# Patient Record
Sex: Male | Born: 1955 | Race: Black or African American | Hispanic: No | Marital: Single | Smoking: Current every day smoker
Health system: Southern US, Community
[De-identification: ages and names within clinical notes are randomized; demographics above are authoritative.]

## PROBLEM LIST (undated history)

## (undated) DIAGNOSIS — C61 Malignant neoplasm of prostate: Secondary | ICD-10-CM

## (undated) DIAGNOSIS — I10 Essential (primary) hypertension: Secondary | ICD-10-CM

## (undated) HISTORY — DX: Malignant neoplasm of prostate: C61

---

## 2018-05-03 ENCOUNTER — Emergency Department (HOSPITAL_COMMUNITY)
Admission: EM | Admit: 2018-05-03 | Discharge: 2018-05-03 | Disposition: A | Discharge status: Home / Self Care | Payer: BLUE CROSS/BLUE SHIELD | Attending: Emergency Medicine | Admitting: Emergency Medicine

## 2018-05-03 ENCOUNTER — Emergency Department (HOSPITAL_COMMUNITY): Payer: BLUE CROSS/BLUE SHIELD

## 2018-05-03 ENCOUNTER — Encounter (HOSPITAL_COMMUNITY): Payer: Self-pay

## 2018-05-03 DIAGNOSIS — N5089 Other specified disorders of the male genital organs: Secondary | ICD-10-CM

## 2018-05-03 DIAGNOSIS — F17210 Nicotine dependence, cigarettes, uncomplicated: Secondary | ICD-10-CM | POA: Insufficient documentation

## 2018-05-03 DIAGNOSIS — N433 Hydrocele, unspecified: Secondary | ICD-10-CM

## 2018-05-03 DIAGNOSIS — I10 Essential (primary) hypertension: Secondary | ICD-10-CM | POA: Diagnosis not present

## 2018-05-03 HISTORY — DX: Essential (primary) hypertension: I10

## 2018-05-03 LAB — CBC WITH DIFFERENTIAL/PLATELET
Abs Immature Granulocytes: 0.03 10*3/uL (ref 0.00–0.07)
Basophils Absolute: 0.1 10*3/uL (ref 0.0–0.1)
Basophils Relative: 1 %
Eosinophils Absolute: 0.1 10*3/uL (ref 0.0–0.5)
Eosinophils Relative: 1 %
HCT: 49.7 % (ref 39.0–52.0)
Hemoglobin: 16.1 g/dL (ref 13.0–17.0)
Immature Granulocytes: 0 %
Lymphocytes Relative: 29 %
Lymphs Abs: 3 10*3/uL (ref 0.7–4.0)
MCH: 28.9 pg (ref 26.0–34.0)
MCHC: 32.4 g/dL (ref 30.0–36.0)
MCV: 89.2 fL (ref 80.0–100.0)
Monocytes Absolute: 0.9 10*3/uL (ref 0.1–1.0)
Monocytes Relative: 9 %
Neutro Abs: 6.2 10*3/uL (ref 1.7–7.7)
Neutrophils Relative %: 60 %
Platelets: 223 10*3/uL (ref 150–400)
RBC: 5.57 MIL/uL (ref 4.22–5.81)
RDW: 15.6 % — ABNORMAL HIGH (ref 11.5–15.5)
WBC: 10.3 10*3/uL (ref 4.0–10.5)
nRBC: 0 % (ref 0.0–0.2)

## 2018-05-03 LAB — BASIC METABOLIC PANEL
Anion gap: 7 (ref 5–15)
BUN: 13 mg/dL (ref 8–23)
CO2: 23 mmol/L (ref 22–32)
Calcium: 9.2 mg/dL (ref 8.9–10.3)
Chloride: 107 mmol/L (ref 98–111)
Creatinine, Ser: 1.11 mg/dL (ref 0.61–1.24)
GFR calc Af Amer: >60 mL/min (ref >60)
GFR calc non Af Amer: >60 mL/min (ref >60)
Glucose, Bld: 108 mg/dL — ABNORMAL HIGH (ref 70–99)
Potassium: 4.3 mmol/L (ref 3.5–5.1)
Sodium: 137 mmol/L (ref 135–145)

## 2018-05-03 MED ORDER — HYDROCHLOROTHIAZIDE 25 MG PO TABS
25.0000 mg | ORAL_TABLET | Freq: Every day | ORAL | Status: DC
  Administered 2018-05-03: 25 mg via ORAL
  Filled 2018-05-03: qty 1

## 2018-05-03 MED ORDER — LISINOPRIL 20 MG PO TABS
20.0000 mg | ORAL_TABLET | Freq: Once | ORAL | Status: AC
  Administered 2018-05-03: 20 mg via ORAL
  Filled 2018-05-03: qty 1

## 2018-05-03 MED ORDER — HYDROCHLOROTHIAZIDE 25 MG PO TABS: 25.0000 mg | ORAL_TABLET | Freq: Every day | ORAL | 0 refills | Status: DC

## 2018-05-03 MED ORDER — LISINOPRIL 20 MG PO TABS: 40.0000 mg | ORAL_TABLET | Freq: Every day | ORAL | 0 refills | Status: DC

## 2018-05-03 NOTE — ED Provider Notes (Signed)
[facility] EMER[facility] DEPARTMENT Provider Note   CSN: [id number] Arrival date & time: 05/03/18  1328     History   Chief Complaint Chief Complaint  Patient presents with  . Groin Swelling    HPI Randall Larsen is a 63 y.o. male.  HPI   63 year old male presents today with complaints of right testicular swelling.  Patient is a poor historian but notes that approximately 4 months ago he was at the gym working out when he felt pain in his right pelvic region.  He notes since that time he has had swelling to his right testicle, he denies any significant pain with this, denies any dysuria abdominal pain fever, weight loss, or any swelling to the left testicle or scrotum.  Patient denies any history of the same.  No history of cancer, he reports he is a smoker.  He notes he has not been taking his antihypertensive medications.  He reports he is supposed to be using lisinopril 40 mg, HCTZ 25 but has not taken them in the last 2 months.  Denies any chest pain shortness of breath.     Past Medical History:  Diagnosis Date  . Hypertension     There are no active problems to display for this patient.   History reviewed. No pertinent surgical history.      Home Medications    Prior to Admission medications   Medication Sig Start Date End Date Taking? Authorizing Provider  hydrochlorothiazide (HYDRODIURIL) 25 MG tablet Take 1 tablet (25 mg total) by mouth daily. 05/03/18   Josha Weekley, Dellis Filbert, PA-C  lisinopril (PRINIVIL,ZESTRIL) 20 MG tablet Take 2 tablets (40 mg total) by mouth daily. 05/03/18   Okey Regal, PA-C    Family History History reviewed. No pertinent family history.  Social History Social History   Tobacco Use  . Smoking status: Current Every Day Smoker    Packs/day: 0.50    Types: Cigarettes  Substance Use Topics  . Alcohol use: Not Currently  . Drug use: Not Currently     Allergies   Patient has no known allergies.   Review of  Systems Review of Systems  All other systems reviewed and are negative.    Physical Exam Updated Vital Signs BP (!) 188/118 (BP Location: Right Arm)   Pulse 78   Temp 97.9 F (36.6 C) (Oral)   Resp 20   SpO2 99%   Physical Exam Vitals signs and nursing note reviewed.  Constitutional:      Appearance: He is well-developed.  HENT:     Head: Normocephalic and atraumatic.  Eyes:     General: No scleral icterus.       Right eye: No discharge.        Left eye: No discharge.     Conjunctiva/sclera: Conjunctivae normal.     Pupils: Pupils are equal, round, and reactive to light.  Neck:     Musculoskeletal: Normal range of motion.     Vascular: No JVD.     Trachea: No tracheal deviation.  Pulmonary:     Effort: Pulmonary effort is normal.     Breath sounds: No stridor.  Abdominal:     General: There is no distension.     Palpations: Abdomen is soft.  Genitourinary:    Comments: Uncircumcised penis, right testicle approximately 10 cm in length by 8 cm in diameter, nontender-left testicle descended-no erythema noted to the scrotum Neurological:     Mental Status: He is alert and oriented to person,  place, and time.     Coordination: Coordination normal.  Psychiatric:        Behavior: Behavior normal.        Thought Content: Thought content normal.        Judgment: Judgment normal.     ED Treatments / Results  Labs (all labs ordered are listed, but only abnormal results are displayed) Labs Reviewed  BASIC METABOLIC PANEL - Abnormal; Notable for the following components:      Result Value   Glucose, Bld 108 (*)    All other components within normal limits  CBC WITH DIFFERENTIAL/PLATELET    EKG None  Radiology No results found.  Procedures Procedures (including critical care time)  Medications Ordered in ED Medications  hydrochlorothiazide (HYDRODIURIL) tablet 25 mg (has no administration in time range)  lisinopril (PRINIVIL,ZESTRIL) tablet 20 mg (has no  administration in time range)     Initial Impression / Assessment and Plan / ED Course  I have reviewed the triage vital signs and the nursing notes.  Pertinent labs & imaging results that were available during my care of the patient were reviewed by me and considered in my medical decision making (see chart for details).       Assessment/Plan: 63 year old male presents today with testicular swelling.  Patient will need ultrasound for further evaluation.  He is also hypertensive but asymptomatic.  Patient will need to be discharged on antihypertensive medication, testicular etiology pending ultrasound.  Patient care signed to oncoming provider pending ultrasound results   Final Clinical Impressions(s) / ED Diagnoses   Final diagnoses:  Hypertension, unspecified type  Testicular swelling    ED Discharge Orders         Ordered    lisinopril (PRINIVIL,ZESTRIL) 20 MG tablet  Daily     05/03/18 1517    hydrochlorothiazide (HYDRODIURIL) 25 MG tablet  Daily     05/03/18 1517           Okey Regal, PA-C 05/03/18 1532    Pattricia Boss, MD 05/04/18 1226

## 2018-05-03 NOTE — ED Notes (Signed)
Urine sample held in mini lab.

## 2018-05-03 NOTE — ED Triage Notes (Signed)
Onset 2 months right testicle swelling.  No pain.  Elevated BP at triage.  Pt has not been on BP med since November, "states I tried to wean myself off of them".  Would like refills on BP meds.

## 2018-05-03 NOTE — ED Notes (Signed)
Patient transported to Ultrasound 

## 2021-02-04 ENCOUNTER — Emergency Department (HOSPITAL_COMMUNITY)
Admission: EM | Admit: 2021-02-04 | Discharge: 2021-02-05 | Disposition: A | Discharge status: Home / Self Care | Payer: Medicare Other | Attending: Emergency Medicine | Admitting: Emergency Medicine

## 2021-02-04 ENCOUNTER — Emergency Department (HOSPITAL_COMMUNITY): Payer: Medicare Other

## 2021-02-04 ENCOUNTER — Other Ambulatory Visit: Payer: Self-pay

## 2021-02-04 ENCOUNTER — Encounter (HOSPITAL_COMMUNITY): Payer: Self-pay | Admitting: Emergency Medicine

## 2021-02-04 DIAGNOSIS — I10 Essential (primary) hypertension: Secondary | ICD-10-CM | POA: Diagnosis not present

## 2021-02-04 DIAGNOSIS — R0789 Other chest pain: Secondary | ICD-10-CM | POA: Diagnosis not present

## 2021-02-04 DIAGNOSIS — M898X8 Other specified disorders of bone, other site: Secondary | ICD-10-CM | POA: Insufficient documentation

## 2021-02-04 DIAGNOSIS — M25551 Pain in right hip: Secondary | ICD-10-CM | POA: Insufficient documentation

## 2021-02-04 DIAGNOSIS — Z79899 Other long term (current) drug therapy: Secondary | ICD-10-CM | POA: Insufficient documentation

## 2021-02-04 DIAGNOSIS — F17210 Nicotine dependence, cigarettes, uncomplicated: Secondary | ICD-10-CM | POA: Diagnosis not present

## 2021-02-04 DIAGNOSIS — M899 Disorder of bone, unspecified: Secondary | ICD-10-CM

## 2021-02-04 LAB — CBC WITH DIFFERENTIAL/PLATELET
Abs Immature Granulocytes: 0.11 10*3/uL — ABNORMAL HIGH (ref 0.00–0.07)
Basophils Absolute: 0.1 10*3/uL (ref 0.0–0.1)
Basophils Relative: 1 %
Eosinophils Absolute: 0 10*3/uL (ref 0.0–0.5)
Eosinophils Relative: 0 %
HCT: 45.8 % (ref 39.0–52.0)
Hemoglobin: 16.3 g/dL (ref 13.0–17.0)
Immature Granulocytes: 1 %
Lymphocytes Relative: 23 %
Lymphs Abs: 2.8 10*3/uL (ref 0.7–4.0)
MCH: 31.2 pg (ref 26.0–34.0)
MCHC: 35.6 g/dL (ref 30.0–36.0)
MCV: 87.6 fL (ref 80.0–100.0)
Monocytes Absolute: 1 10*3/uL (ref 0.1–1.0)
Monocytes Relative: 8 %
Neutro Abs: 8.5 10*3/uL — ABNORMAL HIGH (ref 1.7–7.7)
Neutrophils Relative %: 67 %
Platelets: 242 10*3/uL (ref 150–400)
RBC: 5.23 MIL/uL (ref 4.22–5.81)
RDW: 14.8 % (ref 11.5–15.5)
WBC: 12.6 10*3/uL — ABNORMAL HIGH (ref 4.0–10.5)
nRBC: 0.2 % (ref 0.0–0.2)

## 2021-02-04 LAB — COMPREHENSIVE METABOLIC PANEL
ALT: 20 U/L (ref 0–44)
AST: 21 U/L (ref 15–41)
Albumin: 3.9 g/dL (ref 3.5–5.0)
Alkaline Phosphatase: 83 U/L (ref 38–126)
Anion gap: 10 (ref 5–15)
BUN: 12 mg/dL (ref 8–23)
CO2: 22 mmol/L (ref 22–32)
Calcium: 9.6 mg/dL (ref 8.9–10.3)
Chloride: 105 mmol/L (ref 98–111)
Creatinine, Ser: 1.49 mg/dL — ABNORMAL HIGH (ref 0.61–1.24)
GFR, Estimated: 51 mL/min — ABNORMAL LOW (ref >60)
Glucose, Bld: 109 mg/dL — ABNORMAL HIGH (ref 70–99)
Potassium: 3.4 mmol/L — ABNORMAL LOW (ref 3.5–5.1)
Sodium: 137 mmol/L (ref 135–145)
Total Bilirubin: 1.1 mg/dL (ref 0.3–1.2)
Total Protein: 6.2 g/dL — ABNORMAL LOW (ref 6.5–8.1)

## 2021-02-04 LAB — LIPASE, BLOOD: Lipase: 21 U/L (ref 11–51)

## 2021-02-04 LAB — TROPONIN I (HIGH SENSITIVITY): Troponin I (High Sensitivity): 39 ng/L — ABNORMAL HIGH (ref <18)

## 2021-02-04 IMAGING — CR DG CHEST 2V
2 series · 2 of 2 positions shown · non-contrast
Comparison: None.

CLINICAL DATA: Chest pain

EXAM:
CHEST - 2 VIEW

[chest lat]
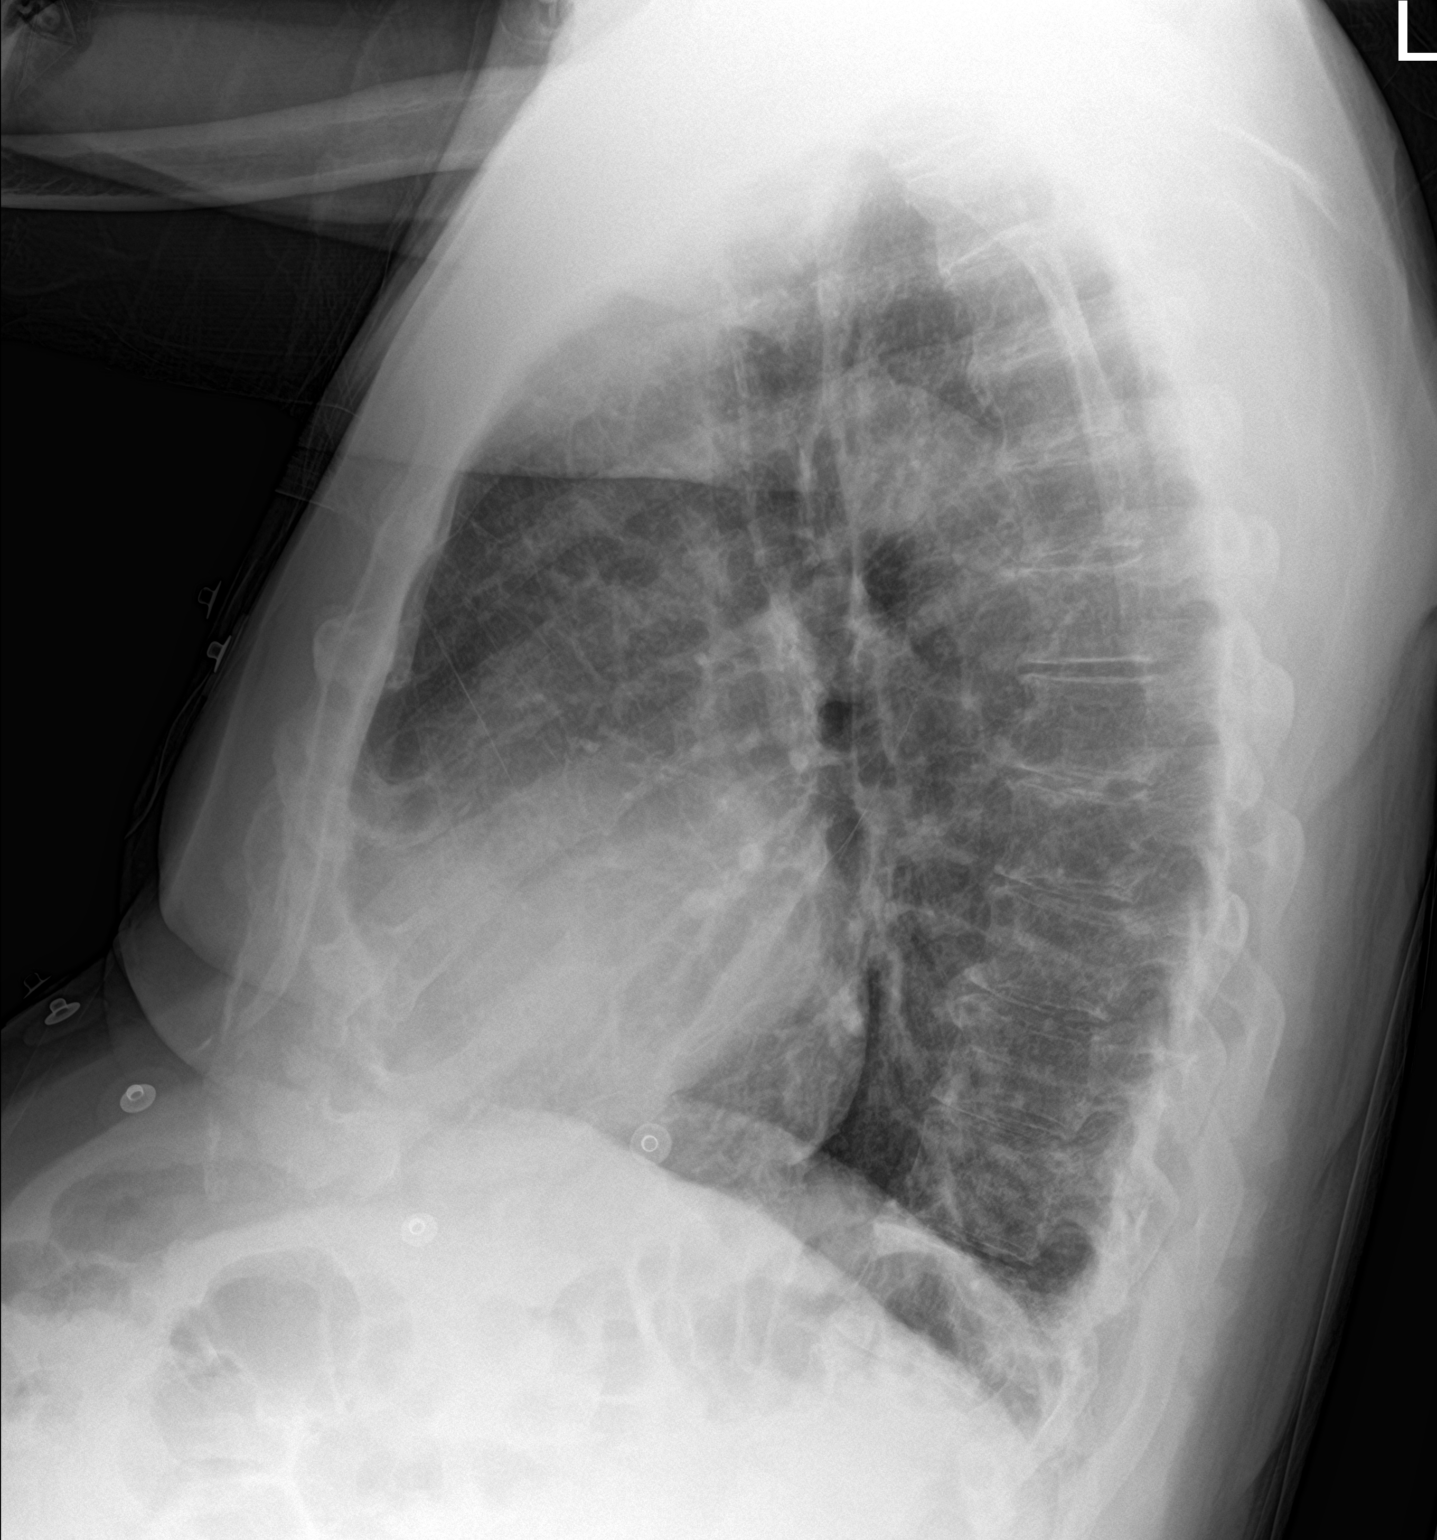

[chest ap]
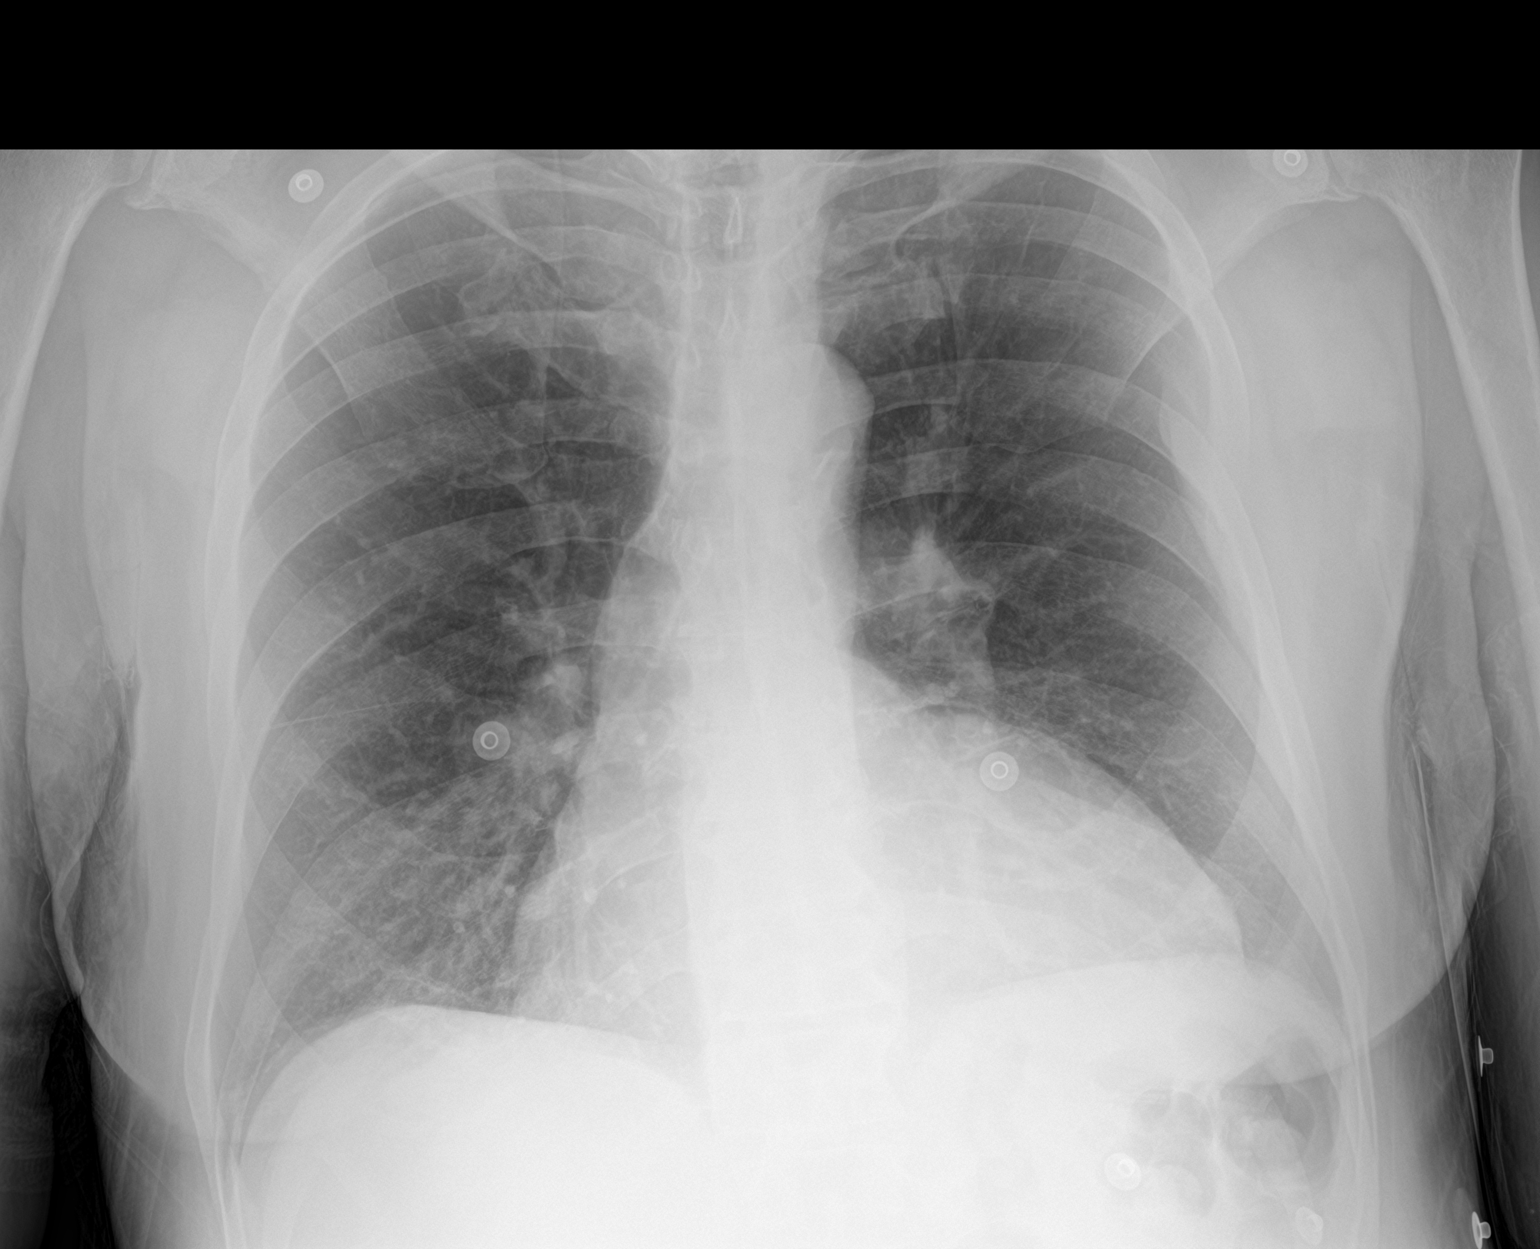

[2 of 2 positions shown; findings below may reference images not displayed]

FINDINGS: The lungs are symmetrically well expanded and are clear. There is
left-sided pleural thickening laterally within the mid to upper lung
zone with possible destruction of the lateral aspect of the left
fifth rib no pneumothorax or pleural effusion. Cardiac size is
mildly enlarged. Pulmonary vascularity is normal.
IMPRESSION: Possible left chest wall mass with destruction of the left fifth rib
laterally. Contrast enhanced CT examination of the chest would be
helpful for further evaluation.

Mild cardiomegaly

## 2021-02-04 NOTE — ED Provider Notes (Signed)
Emergency Medicine Provider Triage Evaluation Note  Randall Larsen , a 65 y.o. male  was evaluated in triage.  Pt complains of chest pain that started at 7pm.  He reports that he had lower back right sided pain over the hip for a few days.  His chest pain is worse with movement.  He had asa and nitro pta with no change in the pain.   As soon as I walk in the room before asking questions he asks me for a sandwich.   Review of Systems  Positive: Chest pain, shortness of breath Negative: vomiting  Physical Exam  BP 124/67 (BP Location: Left Arm)   Pulse 74   Temp 98.5 F (36.9 C) (Oral)   Resp 15   SpO2 100%  Gen:   Awake, no distress   Resp:  Normal effort  MSK:   Moves extremities without difficulty  Other:  Normal speech  Medical Decision Making  Medically screening exam initiated at 9:11 PM.  Appropriate orders placed.  Randall Larsen was informed that the remainder of the evaluation will be completed by another provider, this initial triage assessment does not replace that evaluation, and the importance of remaining in the ED until their evaluation is complete.     Ollen Gross 02/04/21 2114    Tegeler, Gwenyth Allegra, MD 02/05/21 [phone]

## 2021-02-04 NOTE — ED Triage Notes (Signed)
Patient arrived with EMS from home reports central chest pain with SOB worse with movement , he received ASA 324 mg and 1 NTG sl with no relief.

## 2021-02-04 NOTE — ED Notes (Signed)
Randall Larsen [phone] would like an update

## 2021-02-05 ENCOUNTER — Emergency Department (HOSPITAL_COMMUNITY): Payer: Medicare Other

## 2021-02-05 LAB — TROPONIN I (HIGH SENSITIVITY): Troponin I (High Sensitivity): 41 ng/L — ABNORMAL HIGH (ref <18)

## 2021-02-05 IMAGING — CT CT T SPINE W/O CM
3 of 4 series · 13 of 33 positions shown, 16 images · IV contrast (agent unspecified)
Comparison: None

CLINICAL DATA: Abnormal chest radiograph, bone neoplasm suspected

EXAM:
CT Thoracic Spine without contrast
TECHNIQUE: Multiplanar CT images of the thoracic spine were reconstructed from
contemporary CT of the Chest.
CONTRAST:  None

[Series 4: t-spine 2.0 st · axial · 0.48mm/px · z∈[+1266,+1462]mm · 5 of 148 slices shown, 7 images]
[im 25/148  soft-tissue]
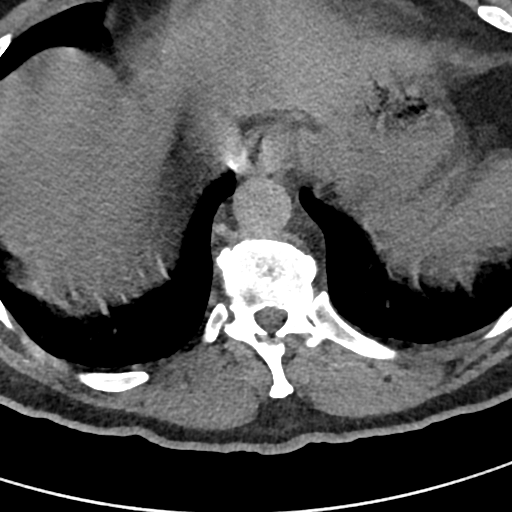
[im 25/148  bone]
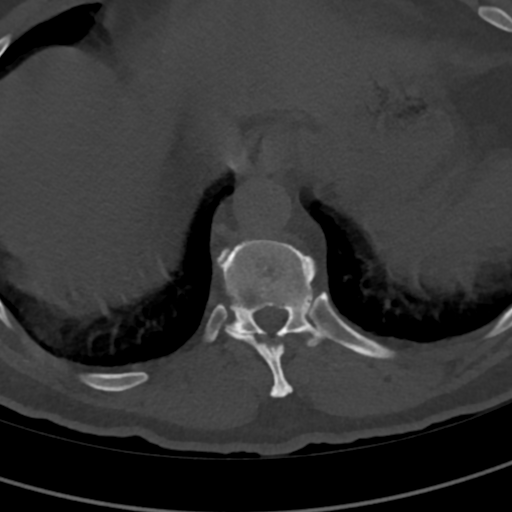
[im 50/148  bone]
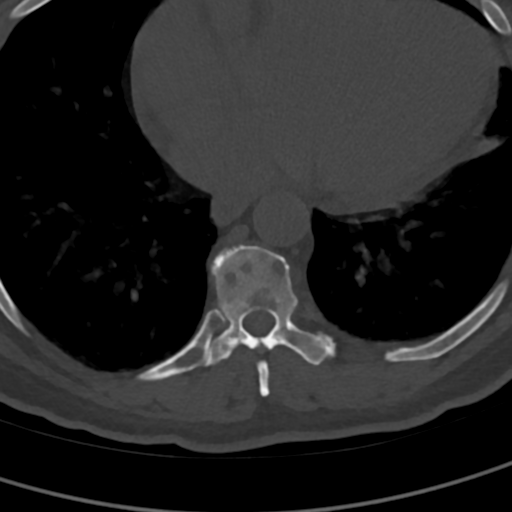
[im 74/148  bone]
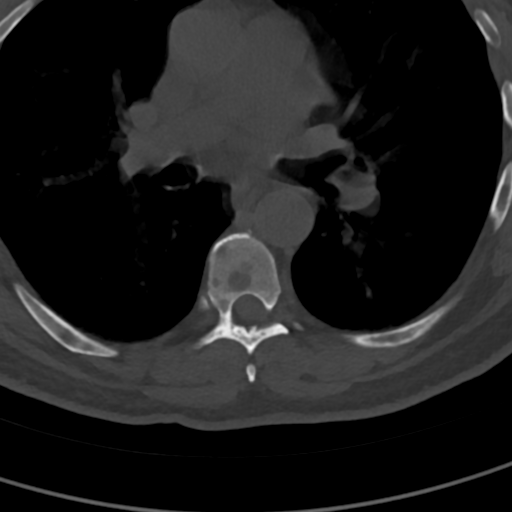
[im 99/148  bone]
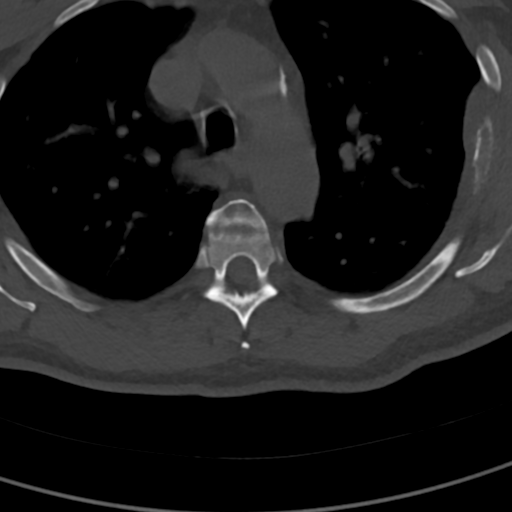
[im 123/148  soft-tissue]
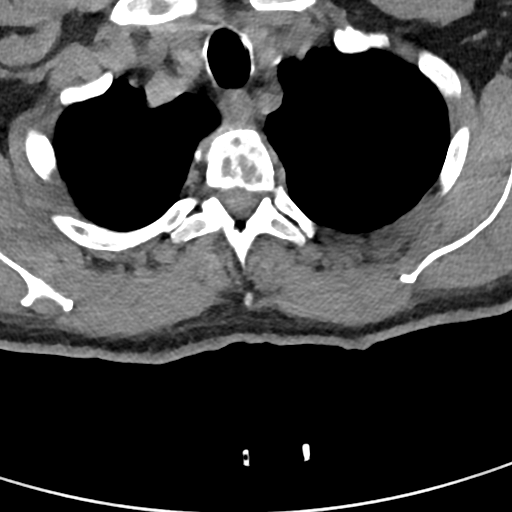
[im 123/148  bone]
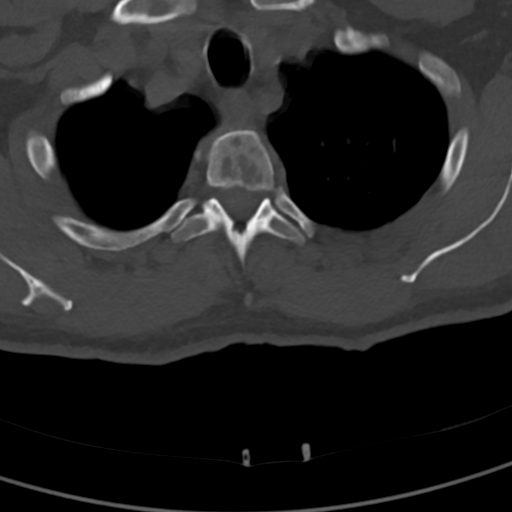

[Series 8: t-spine 2.0 cor bone · coronal · 0.43mm/px · 3 of 87 slices shown]
[im 18/87  bone]
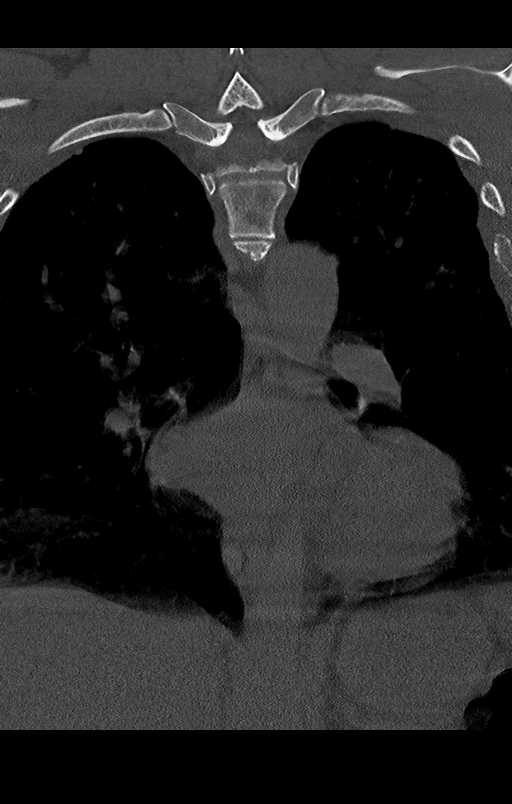
[im 35/87  bone]
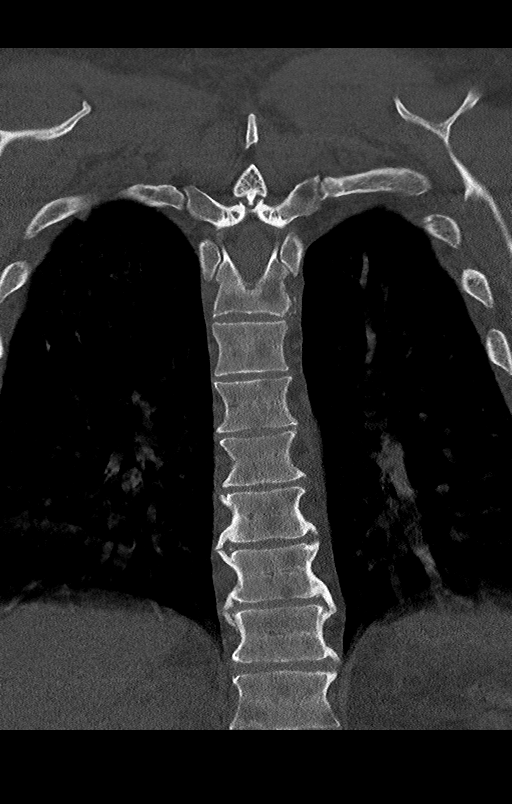
[im 52/87  bone]
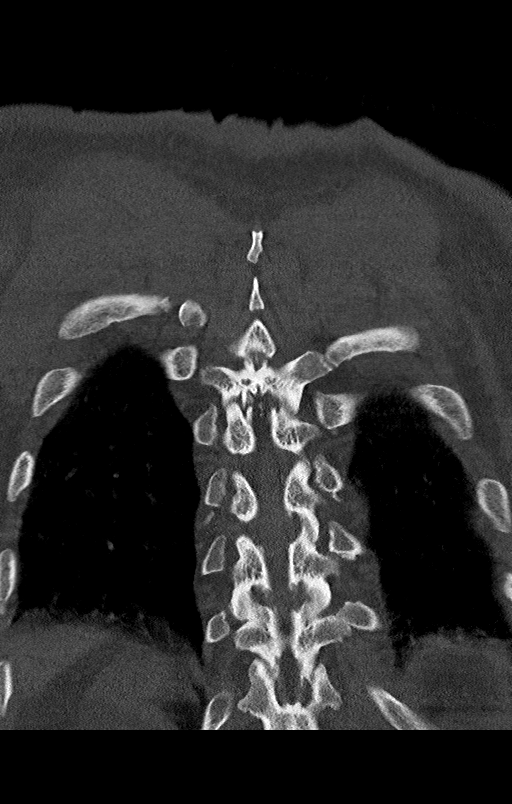

[Series 9: t-spine 2.0 sag bone · sagittal · 0.43mm/px · 5 of 61 slices shown, 6 images]
[im 21/61  bone]
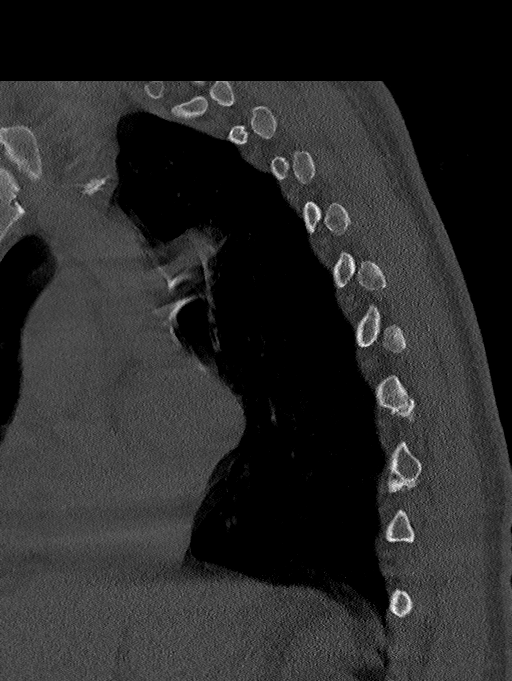
[im 26/61  bone]
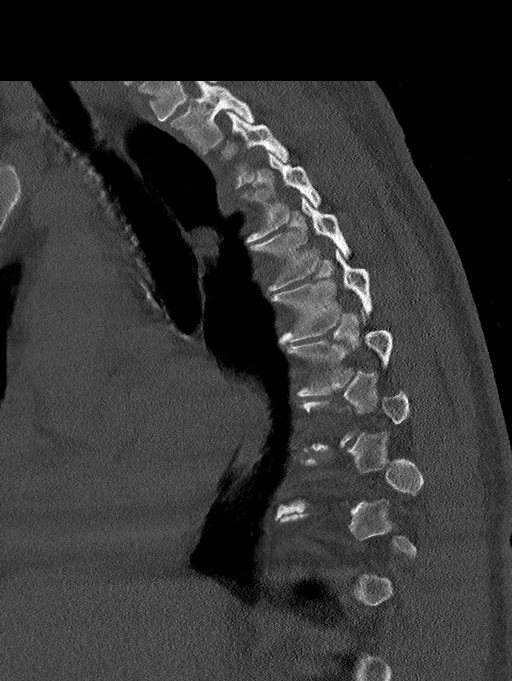
[im 31/61  soft-tissue]
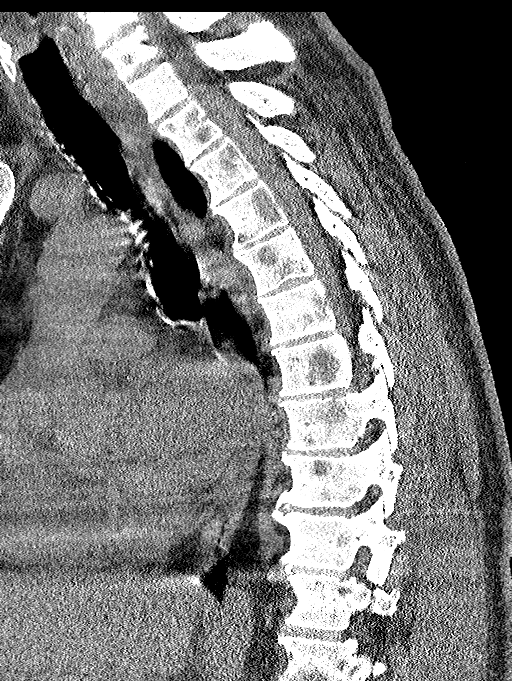
[im 31/61  bone]
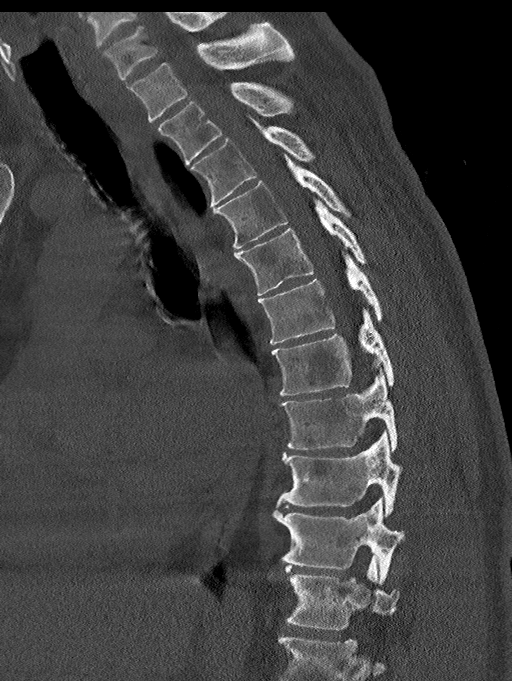
[im 36/61  bone]
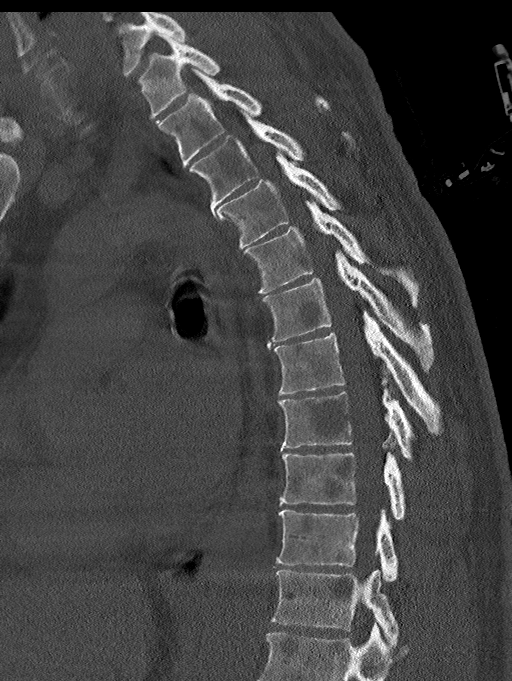
[im 41/61  bone]
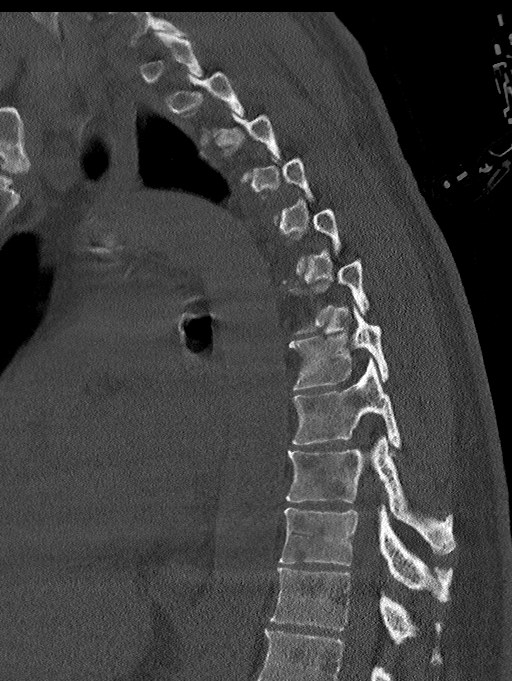

[13 of 33 positions shown; findings below may reference images not displayed]

FINDINGS: Alignment: Normal thoracic kyphosis.

Vertebrae: No evidence of fracture or dislocation. Vertebral body
heights are maintained. Numerous lytic lesions with throughout the
thoracic spine, including a dominant 19 mm lesion along the
posterior aspect of the T8 vertebral body (series 5/image 89). Given
the additional findings on CT chest, this appearance favors lytic
metastases over myeloma. No evidence of pathologic fracture.

Paraspinal and other soft tissues: Better evaluated on dedicated CT
chest.

Disc levels: Mild multilevel degenerative changes. Spinal canal is
patent.
IMPRESSION: Numerous lytic lesions throughout the thoracic spine, favoring lytic
metastases, less likely myeloma. No evidence of pathologic fracture.

## 2021-02-05 IMAGING — CT CT CHEST W/O CM
2 of 3 series · 15 of 36 positions shown, 18 images · non-contrast
Comparison: Chest radiograph dated [DATE]

CLINICAL DATA: Abnormal chest radiograph, back pain

EXAM:
CT CHEST WITHOUT CONTRAST
TECHNIQUE: Multidetector CT imaging of the chest was performed following the
standard protocol without IV contrast.

[Series 3: chest w/o 2mm st · axial · non-contrast · 0.89mm/px · z∈[+1210,+1486]mm · 12 of 164 slices shown, 15 images]
[im 13/164  mediastinal]
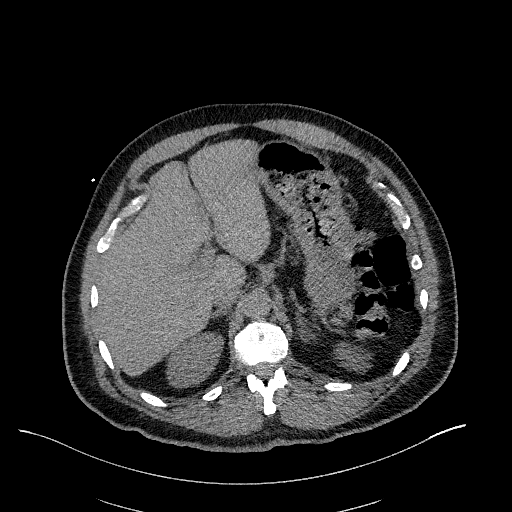
[im 13/164  lung]
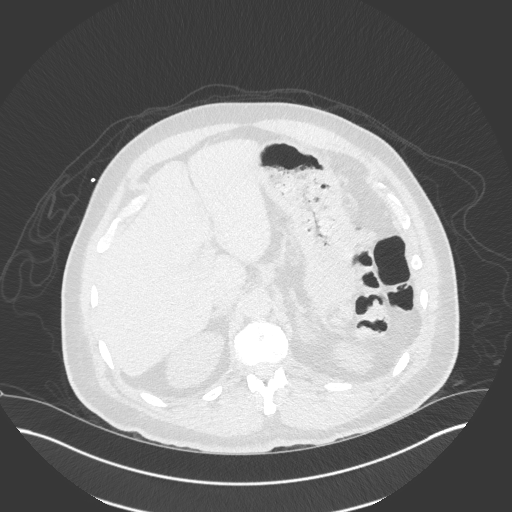
[im 25/164  lung]
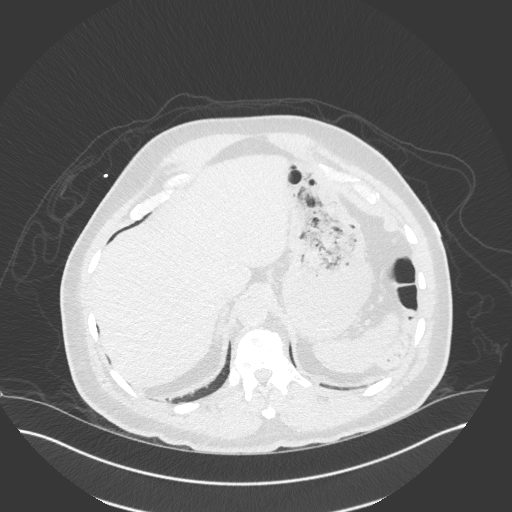
[im 37/164  lung]
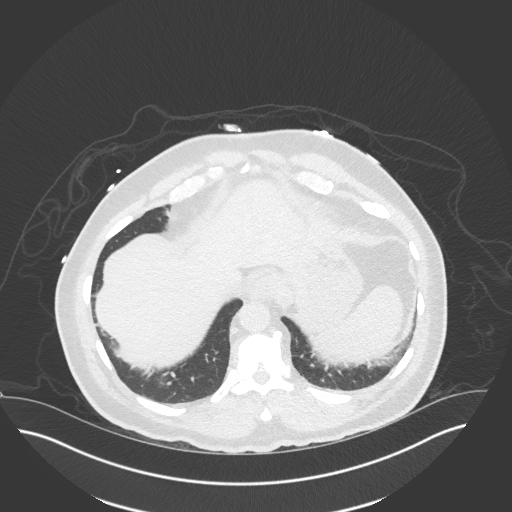
[im 49/164  lung]
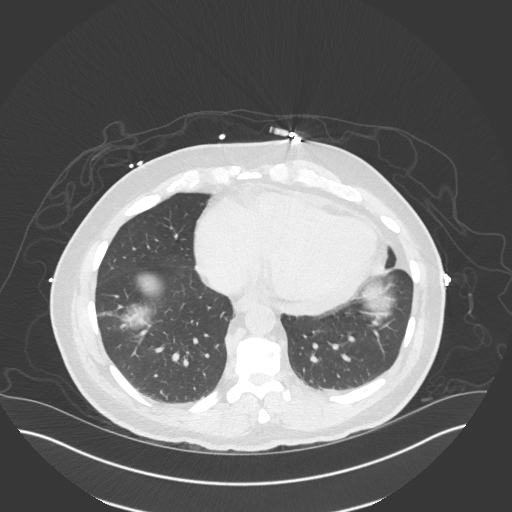
[im 61/164  mediastinal]
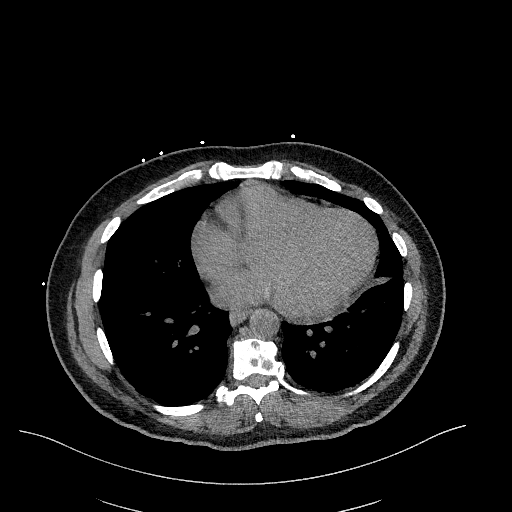
[im 61/164  lung]
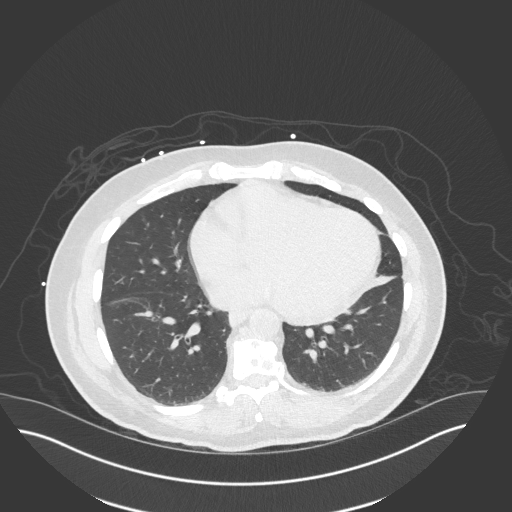
[im 73/164  lung]
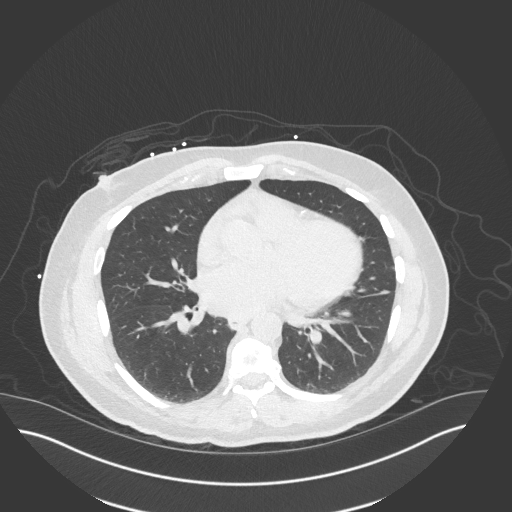
[im 91/164  lung]
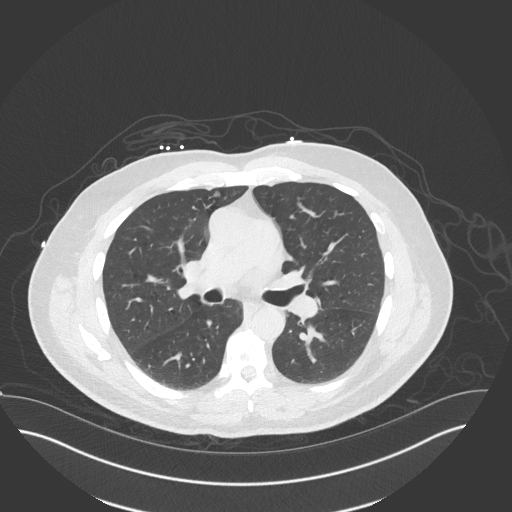
[im 103/164  lung]
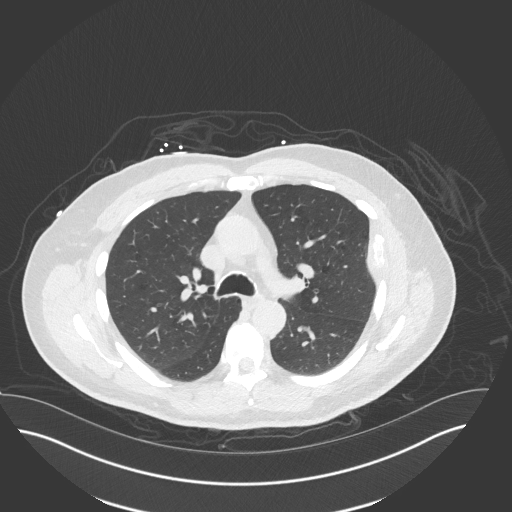
[im 115/164  mediastinal]
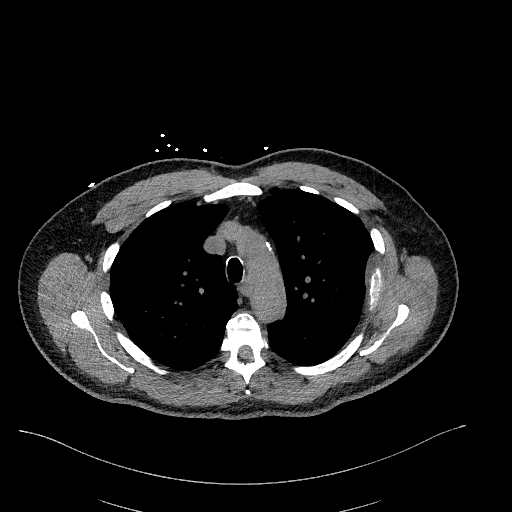
[im 115/164  lung]
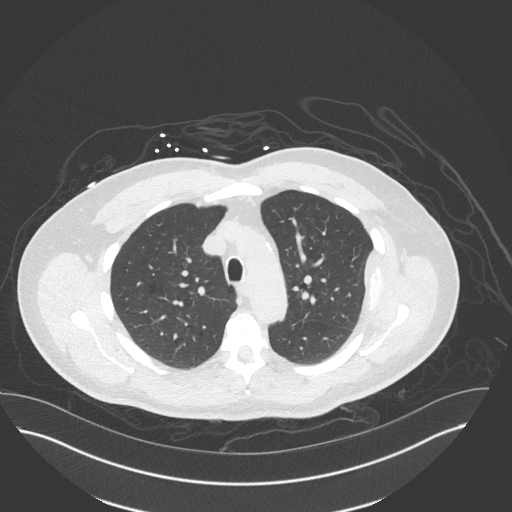
[im 127/164  lung]
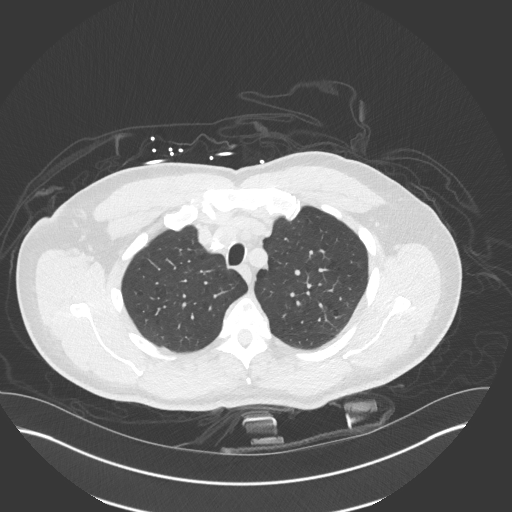
[im 139/164  lung]
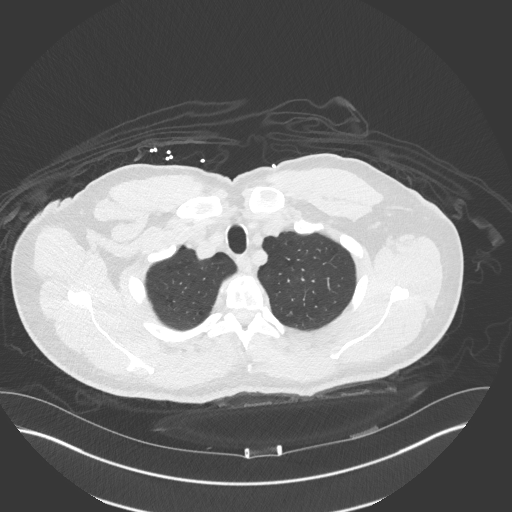
[im 151/164  lung]
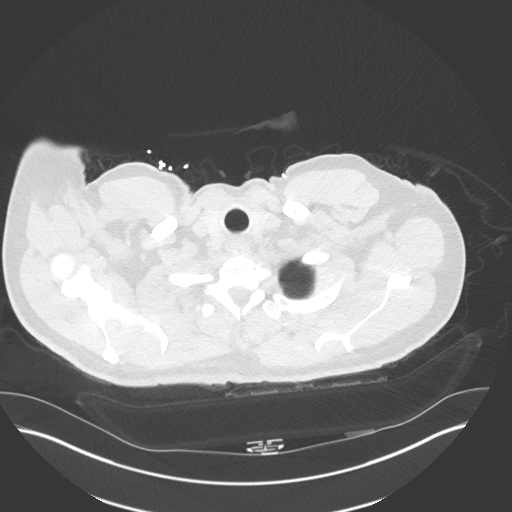

[Series 6: chest w/o 2mm st cor · coronal · non-contrast · 0.64mm/px · 3 of 151 slices shown]
[im 31/151  lung]
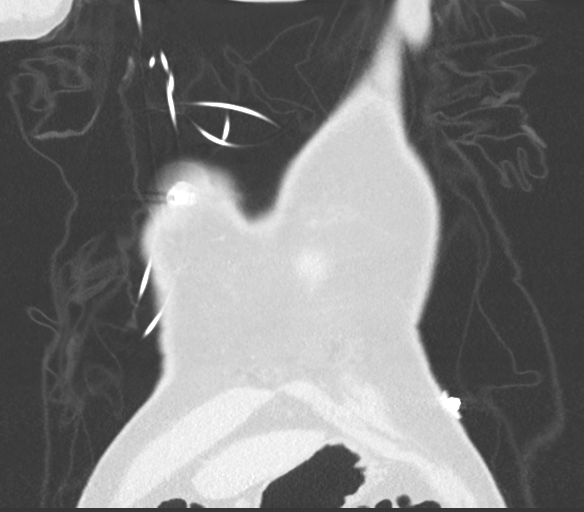
[im 61/151  lung]
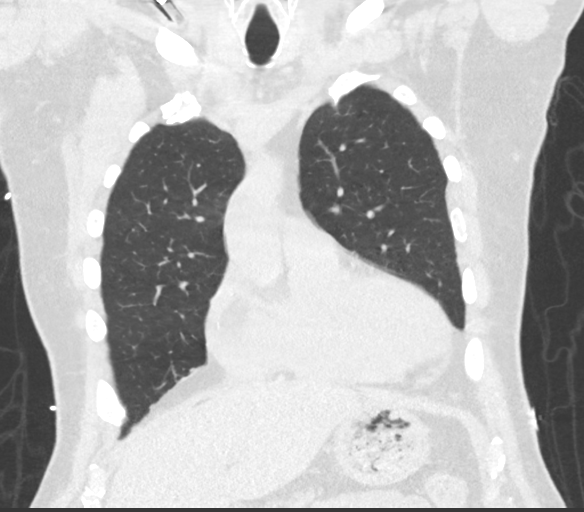
[im 91/151  lung]
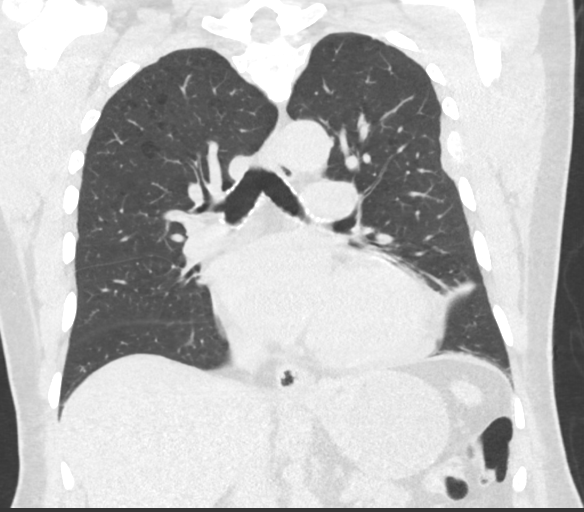

[15 of 36 positions shown; findings below may reference images not displayed]

FINDINGS: Cardiovascular: Mild cardiomegaly. Small inferior pericardial
effusion.

No evidence of thoracic aortic aneurysm. Mild atherosclerotic
calcifications of the aortic arch.

Three vessel coronary atherosclerosis.

Mediastinum/Nodes: No suspicious mediastinal lymphadenopathy.

Visualized thyroid is unremarkable.

Lungs/Pleura: 6 mm nodule in the medial right upper lobe (series
4/image 35).

Lungs are otherwise clear.

No focal consolidation.

Mild centrilobular emphysematous changes, upper lung predominant.

Minimal bibasilar atelectasis.

No pleural effusion or pneumothorax.

Upper Abdomen: Visualized upper abdomen is grossly unremarkable.

Musculoskeletal: Destructive lesion involving the left lateral 4th
rib with associated soft tissue (series 3/image 55), corresponding
to the radiographic abnormality. This appearance favors a lytic
metastasis with pathologic fracture (series 3/image 62).

Numerous lytic lesions throughout the visualized axial and
appendicular skeleton, suggesting lytic metastases or less likely
myeloma.

Dedicated evaluation of the thoracic spine has been obtained and
will be reported separately.
IMPRESSION: Destructive lesion involving the left lateral 4th rib with
associated soft tissue in pathologic fracture, corresponding to the
radiographic abnormality. This appearance favors lytic metastasis.

Numerous lytic lesions throughout the visualized axial and
appendicular skeleton, suggesting liver metastases are less likely
myeloma.

6 mm nodule in the medial right upper lobe. In the setting of
suspected malignancy, Fleischner guidelines do not apply. Follow-up
CT chest is suggested in 3-6 months.

Dedicated evaluation of the thoracic spine has been obtained and
will be reported separately.

Aortic Atherosclerosis ([3G]-[3G]) and Emphysema ([3G]-[3G]).

## 2021-02-05 IMAGING — CT CT L SPINE W/O CM
3 series · 13 of 33 positions shown, 16 images · non-contrast
Comparison: None.

CLINICAL DATA: Low back pain

EXAM:
CT LUMBAR SPINE WITHOUT CONTRAST
TECHNIQUE: Multidetector CT imaging of the lumbar spine was performed without
intravenous contrast administration. Multiplanar CT image
reconstructions were also generated.

[Series 3: l-spine 2.0 st · axial · 0.37mm/px · z∈[+1054,+1216]mm · 5 of 117 slices shown, 7 images]
[im 18/117  soft-tissue]
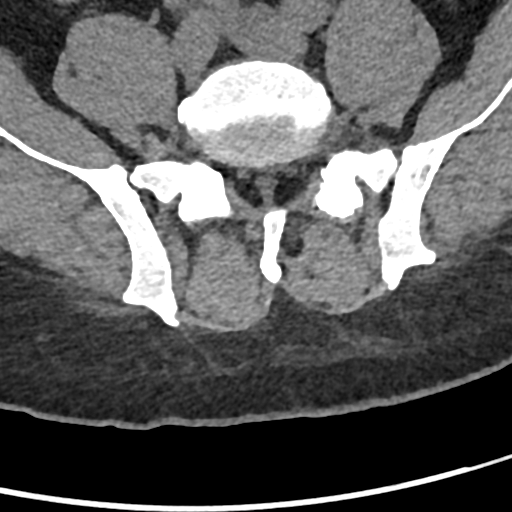
[im 18/117  bone]
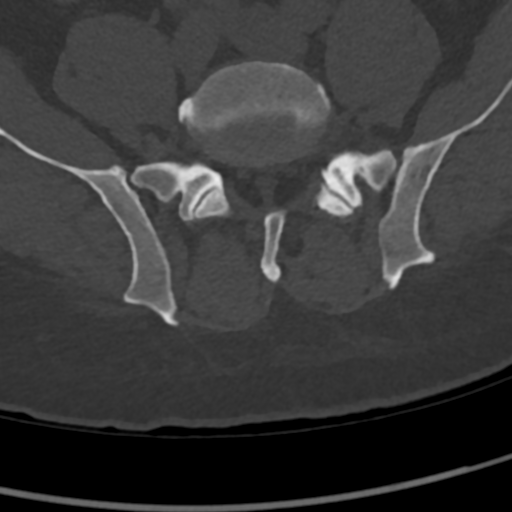
[im 36/117  bone]
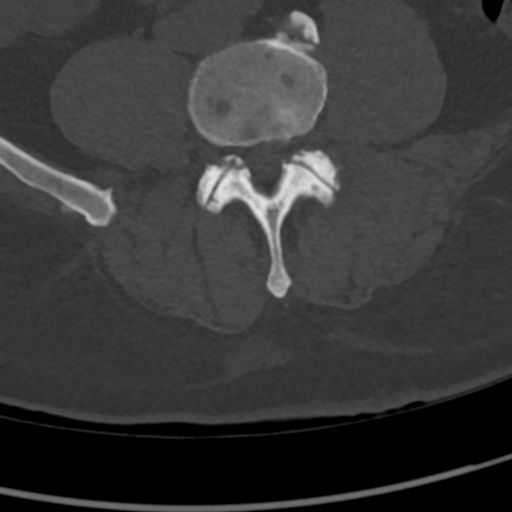
[im 63/117  bone]
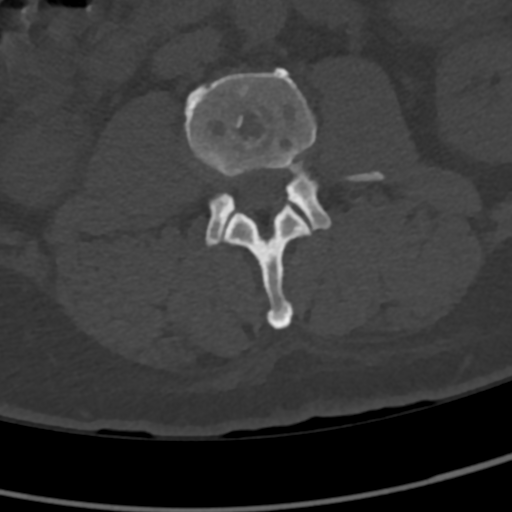
[im 81/117  bone]
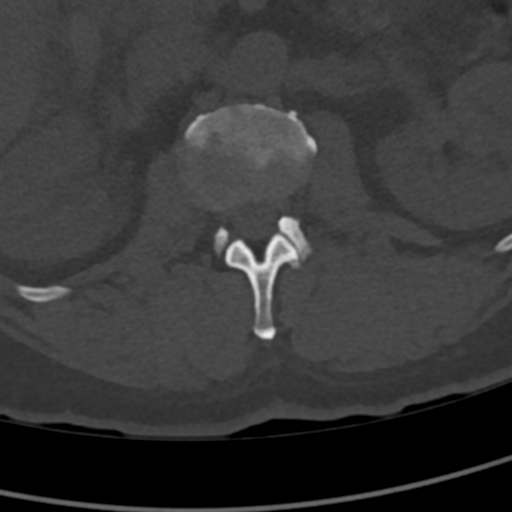
[im 99/117  soft-tissue]
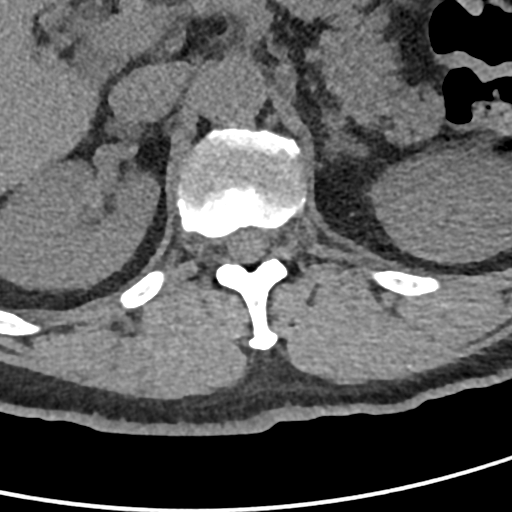
[im 99/117  bone]
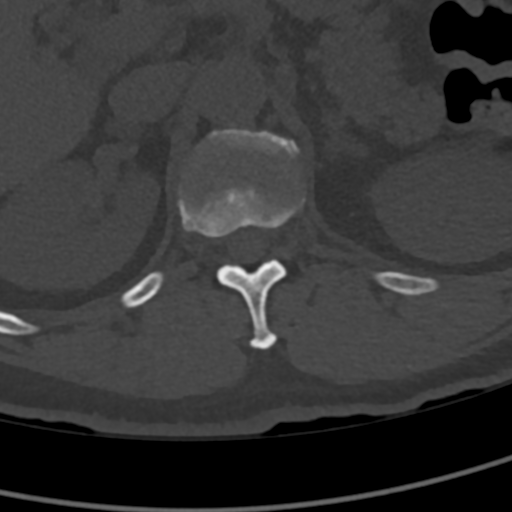

[Series 9: l-spine 2.0 cor · coronal · 0.34mm/px · 3 of 80 slices shown]
[im 16/80  bone]
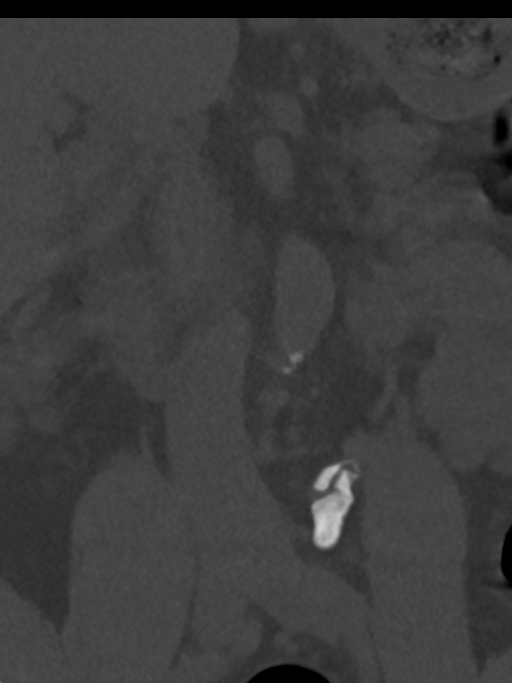
[im 32/80  bone]
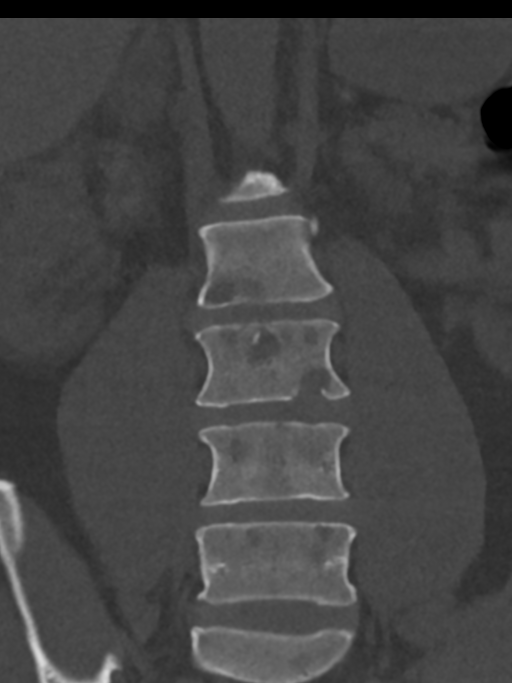
[im 48/80  bone]
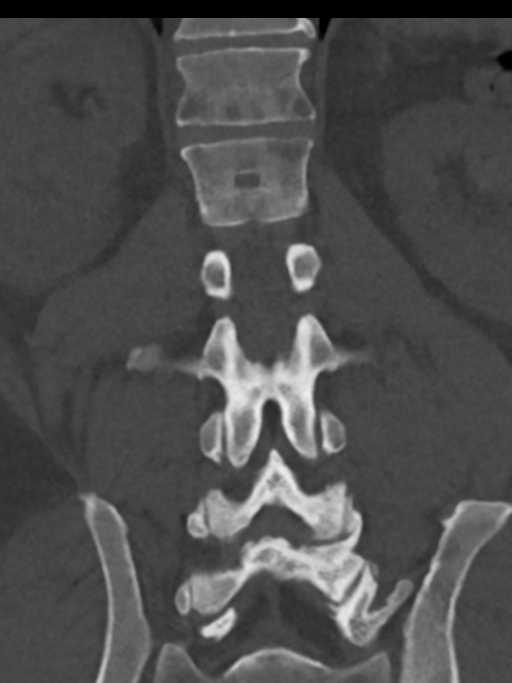

[Series 10: l-spine 2.0 sag · sagittal · 0.34mm/px · 5 of 74 slices shown, 6 images]
[im 25/74  bone]
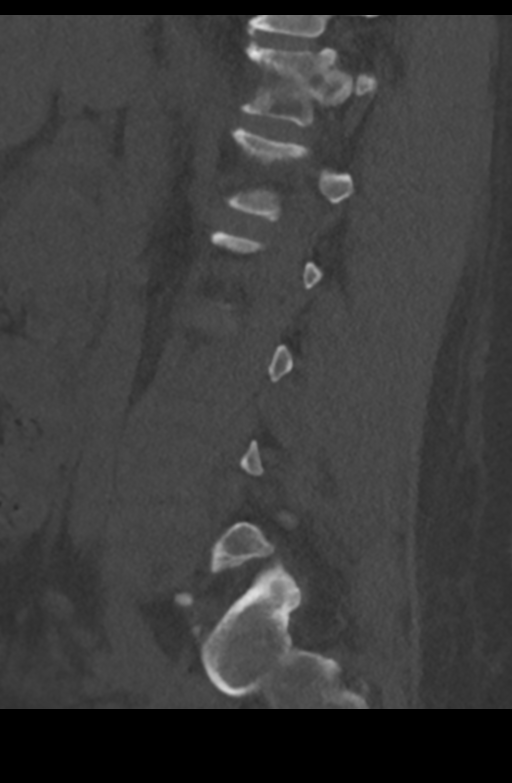
[im 31/74  bone]
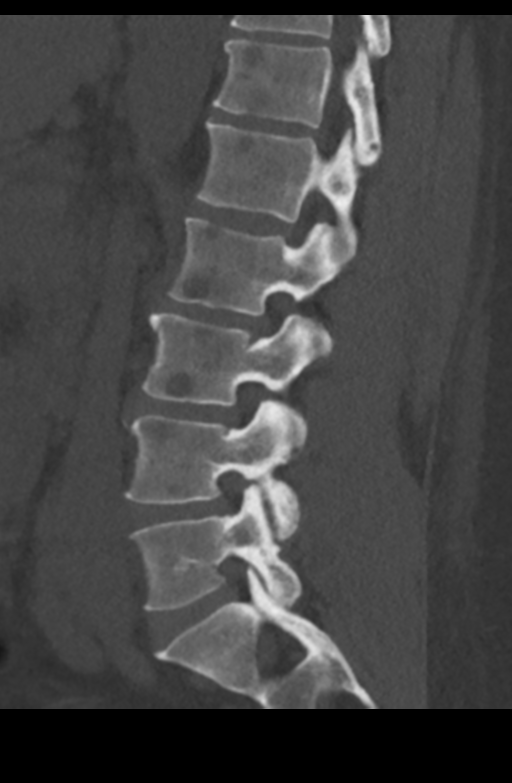
[im 37/74  soft-tissue]
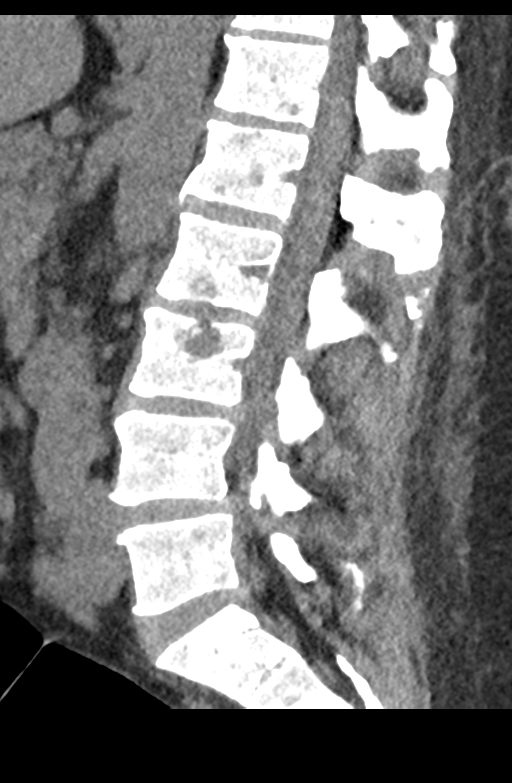
[im 37/74  bone]
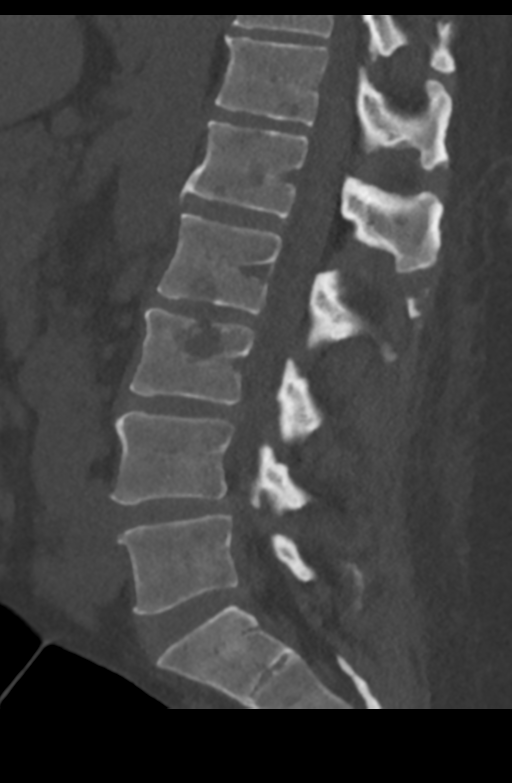
[im 43/74  bone]
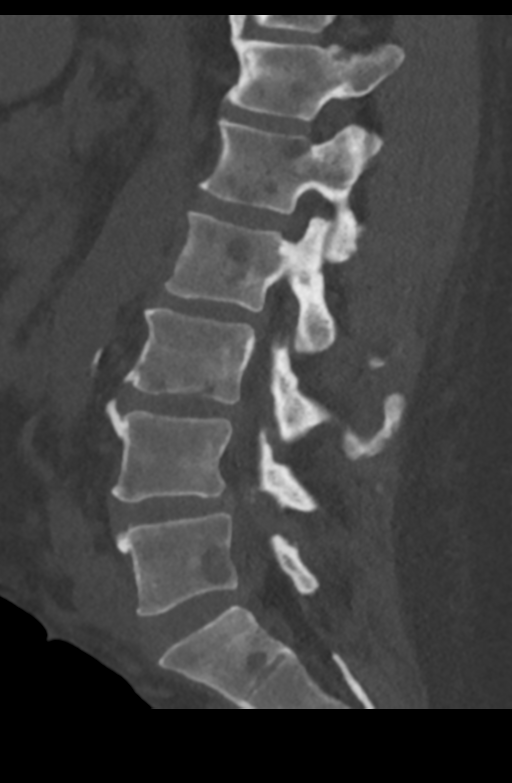
[im 49/74  bone]
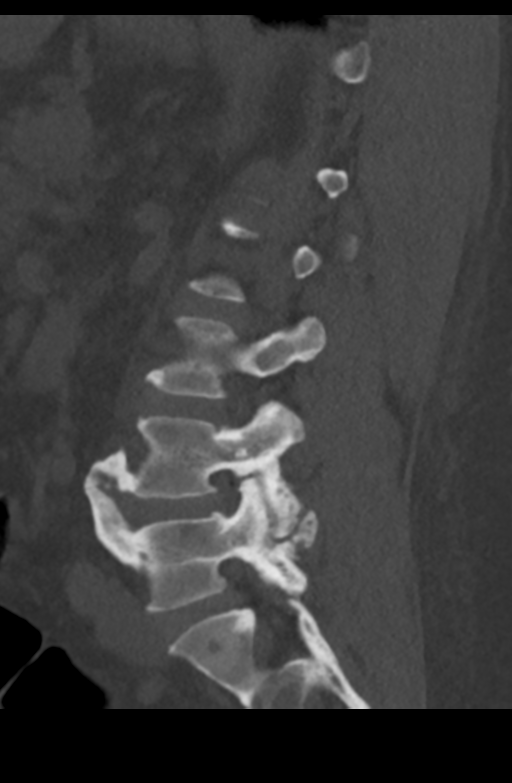

[13 of 33 positions shown; findings below may reference images not displayed]

FINDINGS: Segmentation: 5 lumbar type vertebral bodies.

Alignment: Normal lumbar lordosis.

Vertebrae: No evidence of fracture or dislocation. Vertebral body
heights are maintained. Numerous lytic lesions throughout the lumbar
spine, sacrum, and visualized pelvis, suggesting lytic metastases,
less likely myeloma. Dominant 11 mm lesion along the superior aspect
of the L3 vertebral body (series 4/image 86).

Paraspinal and other soft tissues: Unremarkable.

Disc levels: Mild multilevel degenerative changes. Spinal canal is
patent.
IMPRESSION: Numerous lytic lesions throughout the visualized axial and
appendicular skeleton, favoring lytic metastases, less likely
myeloma. No evidence of pathologic fracture.

## 2021-02-05 MED ORDER — OXYCODONE-ACETAMINOPHEN 5-325 MG PO TABS
2.0000 | ORAL_TABLET | Freq: Once | ORAL | Status: AC
  Administered 2021-02-05: 2 via ORAL
  Filled 2021-02-05: qty 2

## 2021-02-05 MED ORDER — OXYCODONE-ACETAMINOPHEN 5-325 MG PO TABS: 1.0000 | ORAL_TABLET | Freq: Three times a day (TID) | ORAL | 0 refills | Status: DC | PRN

## 2021-02-05 MED ORDER — AMLODIPINE BESYLATE 5 MG PO TABS
10.0000 mg | ORAL_TABLET | Freq: Once | ORAL | Status: AC
  Administered 2021-02-05: 10 mg via ORAL
  Filled 2021-02-05: qty 2

## 2021-02-05 MED ORDER — FENTANYL CITRATE PF 50 MCG/ML IJ SOSY
50.0000 ug | PREFILLED_SYRINGE | Freq: Once | INTRAMUSCULAR | Status: DC
Start: 2021-02-05 — End: 2021-02-05
  Filled 2021-02-05: qty 1

## 2021-02-05 MED ORDER — DOCUSATE SODIUM 100 MG PO CAPS: 100.0000 mg | ORAL_CAPSULE | Freq: Two times a day (BID) | ORAL | 0 refills | Status: DC

## 2021-02-05 NOTE — ED Notes (Signed)
Pt refused IV for CT scan. Explained reasoning for contrasted CT. Pt still refused at this time. Sandwich and cola given

## 2021-02-05 NOTE — Discharge Instructions (Signed)
You have multiple lesions in multiple bones on your CT scans. I suspect this is likely prostate cancer that has spread but it could be a new cancer of some sort as well. You need to ensure you follow up with your urologist and i've also put in for an oncology consult so expect a call from them, if you don't get one then call the number provided in this paperwork.  Please take the pain meds only as needed. If not severe, stick with ibuprofen/tylenol. If your taking pain meds quite a bit then please take colace (stool softener) with it and observe for signs of dependence.

## 2021-02-05 NOTE — ED Provider Notes (Signed)
[facility] EMERGENCY DEPARTMENT Provider Note   CSN: [id number] Arrival date & time: 02/04/21  2040     History Chief Complaint  Patient presents with   Chest Pain    Randall Isabell. is a 65 y.o. male.  65 yo M w/ chest pain. Started earlier [employer].  States he is never had a before.  He did have some pain in his right hip and his back but that seems to have improved at this point and now just having pain in his chest.  Patient states when he moves a certain way he feels that if he takes a deep breath he feels and if he touches his left anterior chest seems to make it worse as well.   Chest Pain     Past Medical History:  Diagnosis Date   Hypertension     There are no problems to display for this patient.   History reviewed. No pertinent surgical history.     No family history on file.  Social History   Tobacco Use   Smoking status: Every Day    Packs/day: 0.50    Types: Cigarettes  Substance Use Topics   Alcohol use: Not Currently   Drug use: Not Currently    Home Medications Prior to Admission medications   Medication Sig Start Date End Date Taking? Authorizing Provider  amLODipine (NORVASC) 10 MG tablet Take 10 mg by mouth daily. 12/29/20  Yes [provider]  docusate sodium (COLACE) 100 MG capsule Take 1 capsule (100 mg total) by mouth every 12 (twelve) hours. 02/05/21  Yes Bertina Guthridge, Corene Cornea, MD  hydrALAZINE (APRESOLINE) 50 MG tablet Take 50 mg by mouth 3 (three) times daily. 02/03/21  Yes [provider]  metoprolol succinate (TOPROL-XL) 100 MG 24 hr tablet Take 100 mg by mouth daily. 02/03/21  Yes [provider]  Multiple Vitamins-Minerals (ONE-A-DAY MENS 50+) TABS Take 1 tablet by mouth daily.   Yes [provider]  naproxen (NAPROSYN) 500 MG tablet Take 500 mg by mouth 2 (two) times daily as needed for moderate pain. 01/04/21  Yes [provider]  oxyCODONE-acetaminophen (PERCOCET) 5-325 MG  tablet Take 1 tablet by mouth every 8 (eight) hours as needed for severe pain. 02/05/21  Yes Caly Pellum, Corene Cornea, MD  sildenafil (REVATIO) 20 MG tablet Take 20 mg by mouth daily as needed (ED). 07/18/18  Yes [provider]    Allergies    Patient has no known allergies.  Review of Systems   Review of Systems  Cardiovascular:  Positive for chest pain.  All other systems reviewed and are negative.  Physical Exam Updated Vital Signs BP (!) 155/85   Pulse 78   Temp 98.5 F (36.9 C) (Oral)   Resp 16   Ht 5\' 11"  (1.803 m)   Wt 75 kg   SpO2 96%   BMI 23.06 kg/m   Physical Exam Vitals and nursing note reviewed.  Constitutional:      Appearance: He is well-developed.  HENT:     Head: Normocephalic and atraumatic.  Cardiovascular:     Rate and Rhythm: Normal rate.     Heart sounds: Normal heart sounds.  Pulmonary:     Effort: Pulmonary effort is normal. No respiratory distress.  Chest:     Chest wall: No mass or tenderness.  Abdominal:     General: There is no distension.     Palpations: Abdomen is soft.  Musculoskeletal:        General:  Normal range of motion.     Cervical back: Normal range of motion.  Skin:    General: Skin is warm and dry.  Neurological:     Mental Status: He is alert.    ED Results / Procedures / Treatments   Labs (all labs ordered are listed, but only abnormal results are displayed) Labs Reviewed  COMPREHENSIVE METABOLIC PANEL - Abnormal; Notable for the following components:      Result Value   Potassium 3.4 (*)    Glucose, Bld 109 (*)    Creatinine, Ser 1.49 (*)    Total Protein 6.2 (*)    GFR, Estimated 51 (*)    All other components within normal limits  CBC WITH DIFFERENTIAL/PLATELET - Abnormal; Notable for the following components:   WBC 12.6 (*)    Neutro Abs 8.5 (*)    Abs Immature Granulocytes 0.11 (*)    All other components within normal limits  TROPONIN I (HIGH SENSITIVITY) - Abnormal; Notable for the following components:    Troponin I (High Sensitivity) 39 (*)    All other components within normal limits  TROPONIN I (HIGH SENSITIVITY) - Abnormal; Notable for the following components:   Troponin I (High Sensitivity) 41 (*)    All other components within normal limits  LIPASE, BLOOD    EKG EKG Interpretation  Date/Time:  Saturday February 04 2021 21:04:11 EDT Ventricular Rate:  73 PR Interval:  196 QRS Duration: 100 QT Interval:  420 QTC Calculation: 462 R Axis:   -75 Text Interpretation: Sinus rhythm with Premature atrial complexes Possible Left atrial enlargement Left anterior fascicular block Minimal voltage criteria for LVH, may be normal variant ( Cornell product ) Cannot rule out Inferior infarct (masked by fascicular block?) , age undetermined Anterolateral infarct , age undetermined Abnormal ECG No old tracing to compare Confirmed by Deno Etienne [phone]) on 02/05/2021 10:49:14 AM  Radiology DG Chest 2 View  Result Date: 02/04/2021 CLINICAL DATA:  Chest pain EXAM: CHEST - 2 VIEW COMPARISON:  None. FINDINGS: The lungs are symmetrically well expanded and are clear. There is left-sided pleural thickening laterally within the mid to upper lung zone with possible destruction of the lateral aspect of the left fifth rib no pneumothorax or pleural effusion. Cardiac size is mildly enlarged. Pulmonary vascularity is normal. IMPRESSION: Possible left chest wall mass with destruction of the left fifth rib laterally. Contrast enhanced CT examination of the chest would be helpful for further evaluation. Mild cardiomegaly Electronically Signed   By: Fidela Salisbury M.D.   On: 02/04/2021 21:40   CT Chest Wo Contrast  Result Date: 02/05/2021 CLINICAL DATA:  Abnormal chest radiograph, back pain EXAM: CT CHEST WITHOUT CONTRAST TECHNIQUE: Multidetector CT imaging of the chest was performed following the standard protocol without IV contrast. COMPARISON:  Chest radiograph dated 02/04/2021 FINDINGS: Cardiovascular: Mild  cardiomegaly. Small inferior pericardial effusion. No evidence of thoracic aortic aneurysm. Mild atherosclerotic calcifications of the aortic arch. Three vessel coronary atherosclerosis. Mediastinum/Nodes: No suspicious mediastinal lymphadenopathy. Visualized thyroid is unremarkable. Lungs/Pleura: 6 mm nodule in the medial right upper lobe (series 4/image 35). Lungs are otherwise clear. No focal consolidation. Mild centrilobular emphysematous changes, upper lung predominant. Minimal bibasilar atelectasis. No pleural effusion or pneumothorax. Upper Abdomen: Visualized upper abdomen is grossly unremarkable. Musculoskeletal: Destructive lesion involving the left lateral 4th rib with associated soft tissue (series 3/image 55), corresponding to the radiographic abnormality. This appearance favors a lytic metastasis with pathologic fracture (series 3/image 62). Numerous lytic lesions throughout the visualized  axial and appendicular skeleton, suggesting lytic metastases or less likely myeloma. Dedicated evaluation of the thoracic spine has been obtained and will be reported separately. IMPRESSION: Destructive lesion involving the left lateral 4th rib with associated soft tissue in pathologic fracture, corresponding to the radiographic abnormality. This appearance favors lytic metastasis. Numerous lytic lesions throughout the visualized axial and appendicular skeleton, suggesting liver metastases are less likely myeloma. 6 mm nodule in the medial right upper lobe. In the setting of suspected malignancy, Fleischner guidelines do not apply. Follow-up CT chest is suggested in 3-6 months. Dedicated evaluation of the thoracic spine has been obtained and will be reported separately. Aortic Atherosclerosis (ICD10-I70.0) and Emphysema (ICD10-J43.9). Electronically Signed   By: Julian Hy M.D.   On: 02/05/2021 02:06   CT Thoracic Spine Wo Contrast  Result Date: 02/05/2021 CLINICAL DATA:  Abnormal chest radiograph, bone  neoplasm suspected EXAM: CT Thoracic Spine without contrast TECHNIQUE: Multiplanar CT images of the thoracic spine were reconstructed from contemporary CT of the Chest. CONTRAST:  None COMPARISON:  None FINDINGS: Alignment: Normal thoracic kyphosis. Vertebrae: No evidence of fracture or dislocation. Vertebral body heights are maintained. Numerous lytic lesions with throughout the thoracic spine, including a dominant 19 mm lesion along the posterior aspect of the T8 vertebral body (series 5/image 89). Given the additional findings on CT chest, this appearance favors lytic metastases over myeloma. No evidence of pathologic fracture. Paraspinal and other soft tissues: Better evaluated on dedicated CT chest. Disc levels: Mild multilevel degenerative changes. Spinal canal is patent. IMPRESSION: Numerous lytic lesions throughout the thoracic spine, favoring lytic metastases, less likely myeloma. No evidence of pathologic fracture. Electronically Signed   By: Julian Hy M.D.   On: 02/05/2021 02:12   CT Lumbar Spine Wo Contrast  Result Date: 02/05/2021 CLINICAL DATA:  Low back pain EXAM: CT LUMBAR SPINE WITHOUT CONTRAST TECHNIQUE: Multidetector CT imaging of the lumbar spine was performed without intravenous contrast administration. Multiplanar CT image reconstructions were also generated. COMPARISON:  None. FINDINGS: Segmentation: 5 lumbar type vertebral bodies. Alignment: Normal lumbar lordosis. Vertebrae: No evidence of fracture or dislocation. Vertebral body heights are maintained. Numerous lytic lesions throughout the lumbar spine, sacrum, and visualized pelvis, suggesting lytic metastases, less likely myeloma. Dominant 11 mm lesion along the superior aspect of the L3 vertebral body (series 4/image 86). Paraspinal and other soft tissues: Unremarkable. Disc levels: Mild multilevel degenerative changes. Spinal canal is patent. IMPRESSION: Numerous lytic lesions throughout the visualized axial and appendicular  skeleton, favoring lytic metastases, less likely myeloma. No evidence of pathologic fracture. Electronically Signed   By: Julian Hy M.D.   On: 02/05/2021 02:09    Procedures Procedures   Medications Ordered in ED Medications  oxyCODONE-acetaminophen (PERCOCET/ROXICET) 5-325 MG per tablet 2 tablet (2 tablets Oral Given 02/05/21 0123)  amLODipine (NORVASC) tablet 10 mg (10 mg Oral Given 02/05/21 0415)    ED Course  I have reviewed the triage vital signs and the nursing notes.  Pertinent labs & imaging results that were available during my care of the patient were reviewed by me and considered in my medical decision making (see chart for details).    MDM Rules/Calculators/A&P                         Xr with abnormality in left chest. Not entirely sure it explains his pain but his pain is unlikely to be ACS. Radiology recommends IV contrast CT chest but patient refuses to have an IV  placed. I tried and the nurse tried to convince him as well. Will proceed with oral meds and ct chest wo contrast.   Discussed the findings of his CT scan.  Patient understands the seriousness of this.  He will follow-up with his urologist.  I also put a consult for oncology.  Short course of pain medication given to help with bone lesions.  Doubt ACS.  Doubt any other acute issues requiring hospitalization or further management in the emergency room.   Final Clinical Impression(s) / ED Diagnoses Final diagnoses:  Lytic bone lesions on xray    Rx / DC Orders ED Discharge Orders          Ordered    Ambulatory referral to Hematology / Oncology        02/05/21 0346    oxyCODONE-acetaminophen (PERCOCET) 5-325 MG tablet  Every 8 hours PRN        02/05/21 0351    docusate sodium (COLACE) 100 MG capsule  Every 12 hours        02/05/21 0351             Marieelena Bartko, Corene Cornea, MD 02/06/21 [phone]

## 2021-02-05 NOTE — ED Notes (Signed)
[employer] called for transport

## 2021-02-05 NOTE — ED Notes (Signed)
Pt taken to CT.

## 2021-02-06 ENCOUNTER — Telehealth: Payer: Self-pay | Admitting: Physician Assistant

## 2021-02-06 NOTE — Telephone Encounter (Signed)
Scheduled appt per 10/30 referral. Called pt, no answer. Left a msg with appt date and time. Also requested for pt to call me back directly to confirm appt date and time. Okay to sch per RN Tomi.

## 2021-02-07 ENCOUNTER — Emergency Department (HOSPITAL_COMMUNITY)
Admission: EM | Admit: 2021-02-07 | Discharge: 2021-02-08 | Disposition: A | Discharge status: Home / Self Care | Payer: Medicare Other | Attending: Emergency Medicine | Admitting: Emergency Medicine

## 2021-02-07 ENCOUNTER — Other Ambulatory Visit: Payer: Self-pay

## 2021-02-07 ENCOUNTER — Telehealth: Payer: Self-pay | Admitting: Oncology

## 2021-02-07 ENCOUNTER — Encounter (HOSPITAL_COMMUNITY): Payer: Self-pay

## 2021-02-07 DIAGNOSIS — R911 Solitary pulmonary nodule: Secondary | ICD-10-CM | POA: Insufficient documentation

## 2021-02-07 DIAGNOSIS — Z79899 Other long term (current) drug therapy: Secondary | ICD-10-CM | POA: Diagnosis not present

## 2021-02-07 DIAGNOSIS — R0602 Shortness of breath: Secondary | ICD-10-CM | POA: Diagnosis not present

## 2021-02-07 DIAGNOSIS — I10 Essential (primary) hypertension: Secondary | ICD-10-CM | POA: Insufficient documentation

## 2021-02-07 DIAGNOSIS — F17210 Nicotine dependence, cigarettes, uncomplicated: Secondary | ICD-10-CM | POA: Insufficient documentation

## 2021-02-07 DIAGNOSIS — Z8546 Personal history of malignant neoplasm of prostate: Secondary | ICD-10-CM | POA: Diagnosis not present

## 2021-02-07 DIAGNOSIS — R079 Chest pain, unspecified: Secondary | ICD-10-CM

## 2021-02-07 NOTE — ED Triage Notes (Signed)
Pt BIB EMS for HTN. PT was at [facility] on Sunday. Pt has not taken his medications since Sunday evening due to financial reasons.

## 2021-02-07 NOTE — Telephone Encounter (Signed)
Attempted contact with patient to confirm his awareness and reason for his appointment with the [facility] on 11/7 @ 11am.  Had to leave voicemail for him to call back to [phone] to confirm he can make the appointment.  "Patient Contact" information needs to be updated if anyone is able to reach him.

## 2021-02-08 ENCOUNTER — Other Ambulatory Visit (HOSPITAL_COMMUNITY): Payer: Self-pay

## 2021-02-08 ENCOUNTER — Emergency Department (HOSPITAL_COMMUNITY): Payer: Medicare Other

## 2021-02-08 LAB — CBC WITH DIFFERENTIAL/PLATELET
Abs Immature Granulocytes: 0.13 10*3/uL — ABNORMAL HIGH (ref 0.00–0.07)
Basophils Absolute: 0.1 10*3/uL (ref 0.0–0.1)
Basophils Relative: 1 %
Eosinophils Absolute: 0 10*3/uL (ref 0.0–0.5)
Eosinophils Relative: 0 %
HCT: 46.9 % (ref 39.0–52.0)
Hemoglobin: 15.8 g/dL (ref 13.0–17.0)
Immature Granulocytes: 1 %
Lymphocytes Relative: 20 %
Lymphs Abs: 2.2 10*3/uL (ref 0.7–4.0)
MCH: 30.3 pg (ref 26.0–34.0)
MCHC: 33.7 g/dL (ref 30.0–36.0)
MCV: 89.8 fL (ref 80.0–100.0)
Monocytes Absolute: 1.3 10*3/uL — ABNORMAL HIGH (ref 0.1–1.0)
Monocytes Relative: 11 %
Neutro Abs: 7.5 10*3/uL (ref 1.7–7.7)
Neutrophils Relative %: 67 %
Platelets: 205 10*3/uL (ref 150–400)
RBC: 5.22 MIL/uL (ref 4.22–5.81)
RDW: 15.8 % — ABNORMAL HIGH (ref 11.5–15.5)
WBC: 11.2 10*3/uL — ABNORMAL HIGH (ref 4.0–10.5)
nRBC: 0.2 % (ref 0.0–0.2)

## 2021-02-08 LAB — COMPREHENSIVE METABOLIC PANEL
ALT: 17 U/L (ref 0–44)
AST: 24 U/L (ref 15–41)
Albumin: 4.3 g/dL (ref 3.5–5.0)
Alkaline Phosphatase: 104 U/L (ref 38–126)
Anion gap: 9 (ref 5–15)
BUN: 15 mg/dL (ref 8–23)
CO2: 28 mmol/L (ref 22–32)
Calcium: 9.8 mg/dL (ref 8.9–10.3)
Chloride: 105 mmol/L (ref 98–111)
Creatinine, Ser: 1.34 mg/dL — ABNORMAL HIGH (ref 0.61–1.24)
GFR, Estimated: 58 mL/min — ABNORMAL LOW (ref >60)
Glucose, Bld: 108 mg/dL — ABNORMAL HIGH (ref 70–99)
Potassium: 3.4 mmol/L — ABNORMAL LOW (ref 3.5–5.1)
Sodium: 142 mmol/L (ref 135–145)
Total Bilirubin: 1 mg/dL (ref 0.3–1.2)
Total Protein: 6.7 g/dL (ref 6.5–8.1)

## 2021-02-08 LAB — TROPONIN I (HIGH SENSITIVITY)
Troponin I (High Sensitivity): 64 ng/L — ABNORMAL HIGH (ref <18)
Troponin I (High Sensitivity): 59 ng/L — ABNORMAL HIGH (ref <18)

## 2021-02-08 LAB — BRAIN NATRIURETIC PEPTIDE: B Natriuretic Peptide: 61.9 pg/mL (ref 0.0–100.0)

## 2021-02-08 IMAGING — CT CT ANGIO CHEST
2 of 6 series · 18 of 36 positions shown · IV contrast (omnipaque)
Comparison: Chest CT [DATE]

CLINICAL DATA: PE suspected, high prob chest pain/SOB, bone
lesions, r/o PE

EXAM:
CT ANGIOGRAPHY CHEST WITH CONTRAST
TECHNIQUE: Multidetector CT imaging of the chest was performed using the
standard protocol during bolus administration of intravenous
contrast. Multiplanar CT image reconstructions and MIPs were
obtained to evaluate the vascular anatomy.
CONTRAST:  75mL OMNIPAQUE IOHEXOL 350 MG/ML SOLN

[Series 5: thins · axial · 0.69mm/px · z∈[+1373,+1636]mm · 17 of 297 slices shown]
[im 17/297  lung]
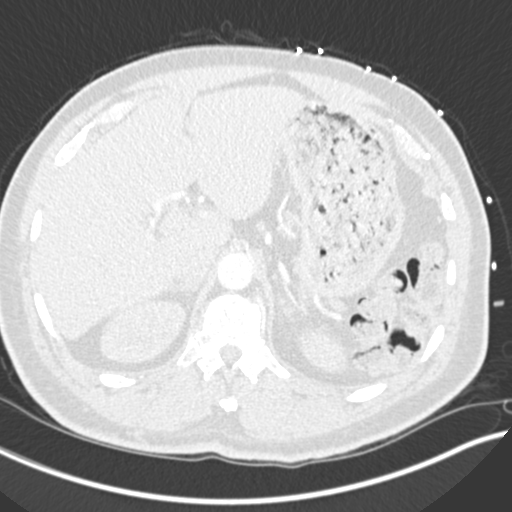
[im 33/297  mediastinal]
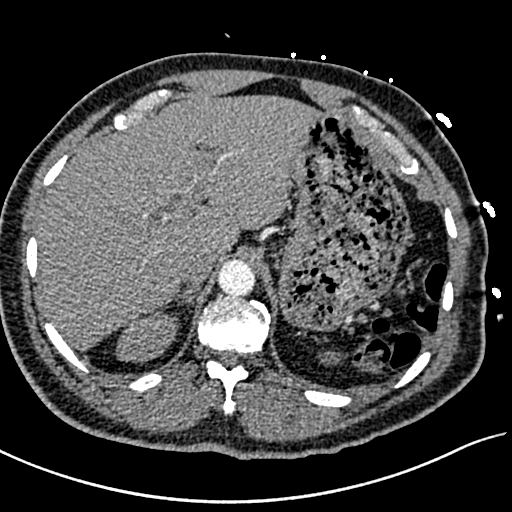
[im 50/297  lung]
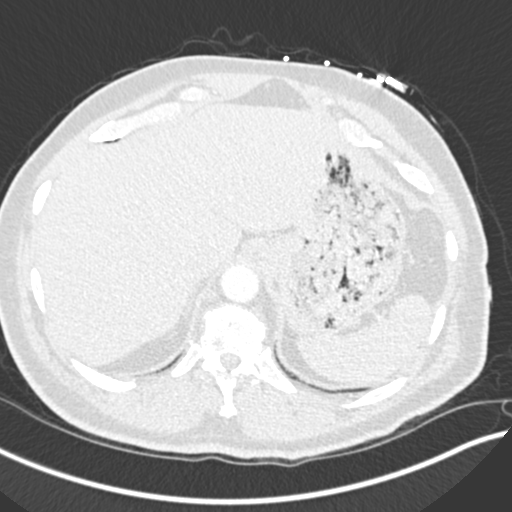
[im 66/297  mediastinal]
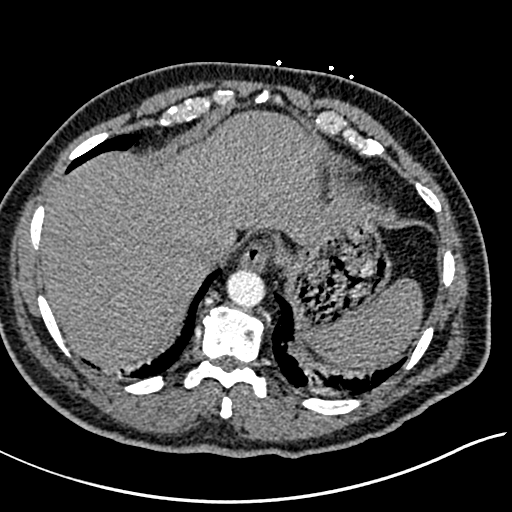
[im 83/297  lung]
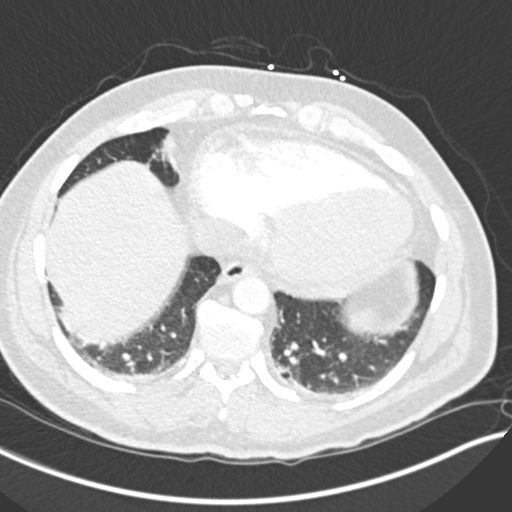
[im 99/297  mediastinal]
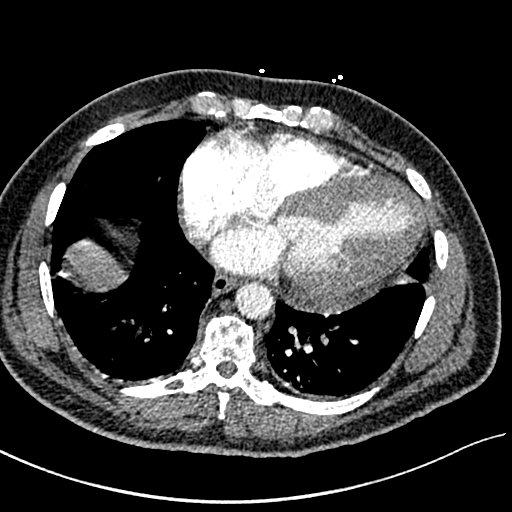
[im 116/297  lung]
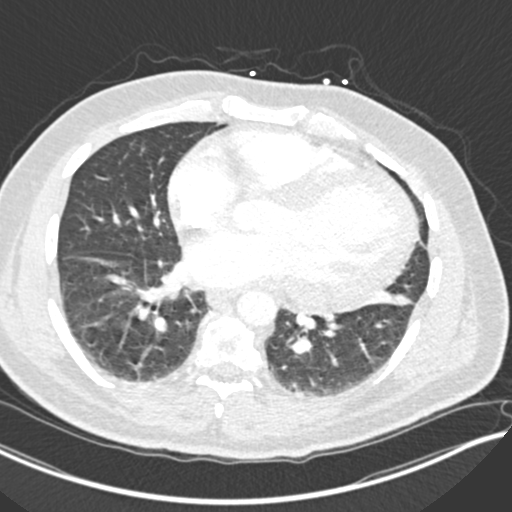
[im 132/297  mediastinal]
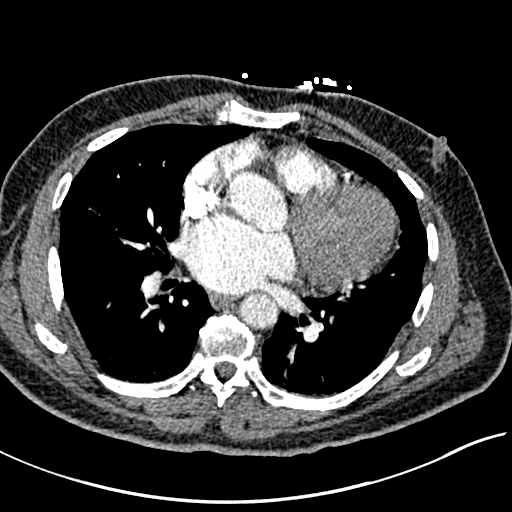
[im 149/297  lung]
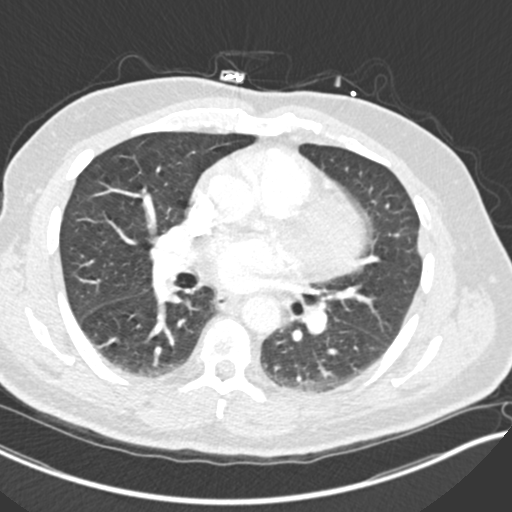
[im 165/297  mediastinal]
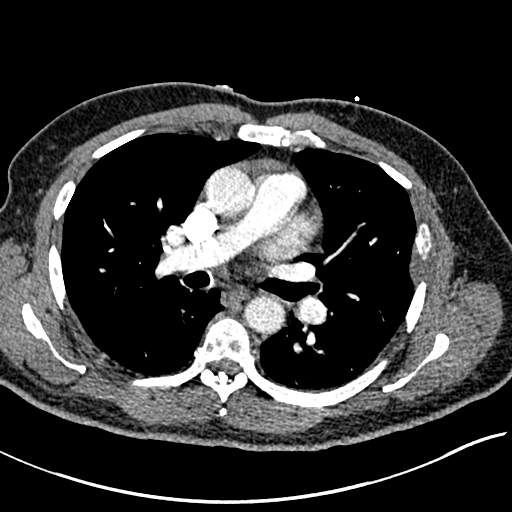
[im 181/297  lung]
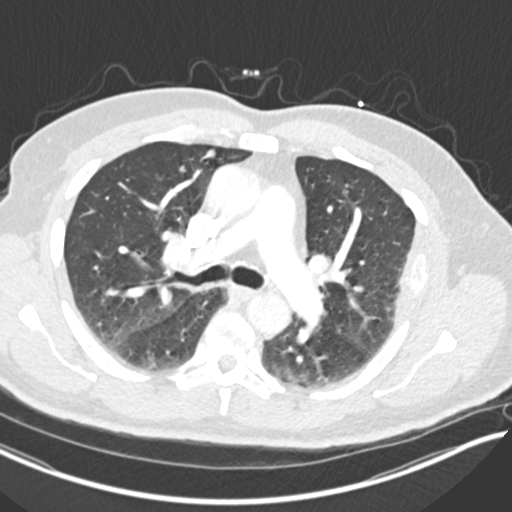
[im 198/297  mediastinal]
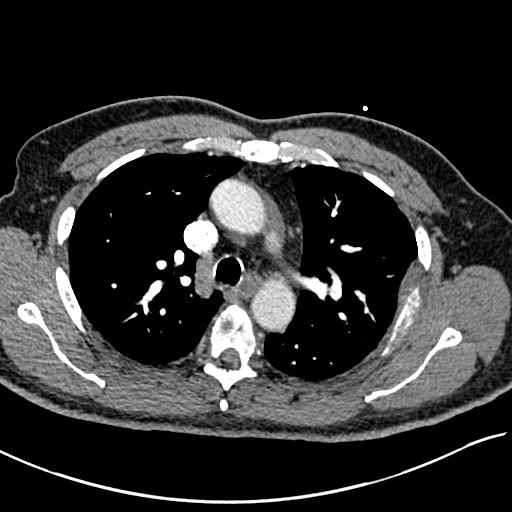
[im 214/297  lung]
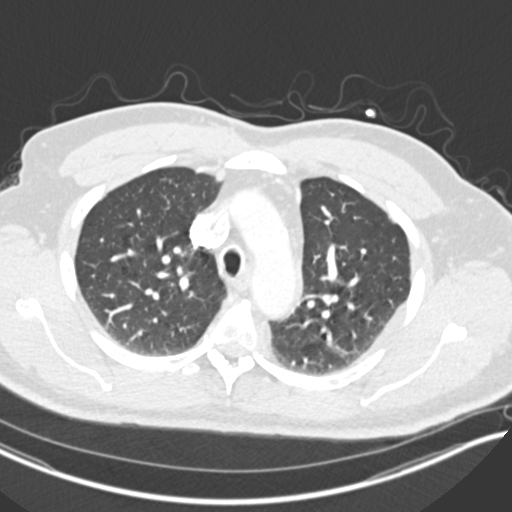
[im 231/297  mediastinal]
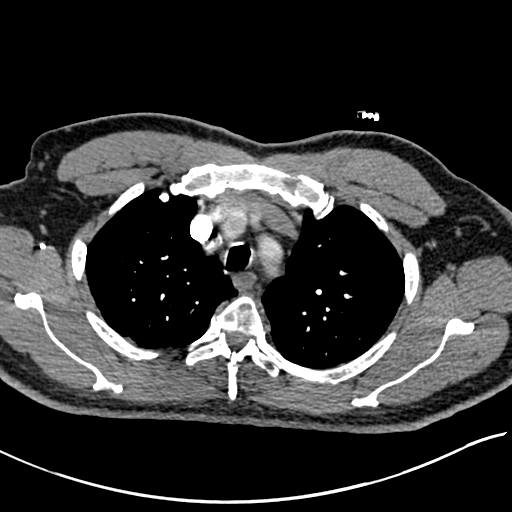
[im 247/297  lung]
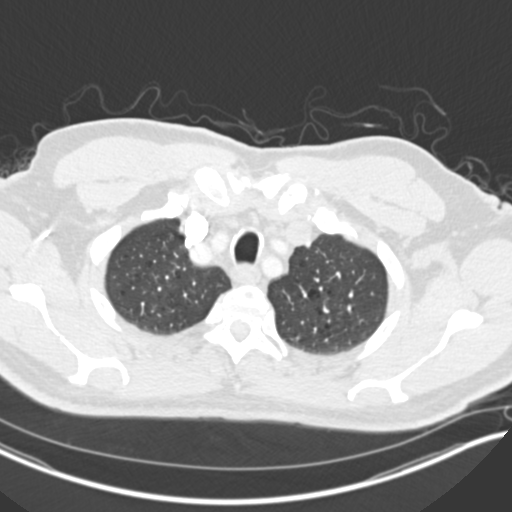
[im 264/297  mediastinal]
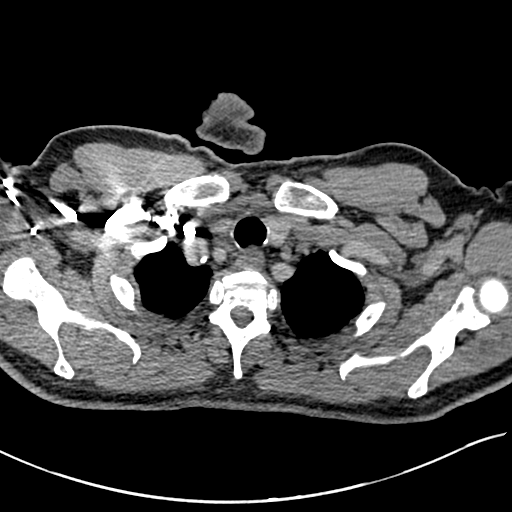
[im 280/297  lung]
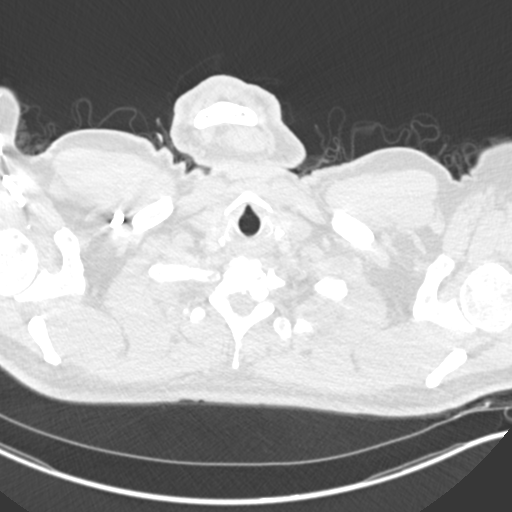

[Series 7: coronal mpr · coronal · 0.60mm/px · 1 of 141 slices shown]
[im 71/141  mediastinal]
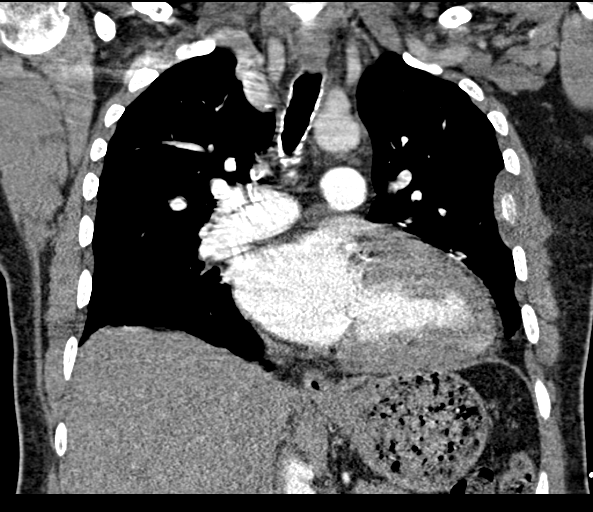

[18 of 36 positions shown; findings below may reference images not displayed]

FINDINGS: Cardiovascular: Unchanged cardiomegaly. Coronary artery
calcifications.No pericardial disease.There is respiratory motion
artifact in the lung bases limiting evaluation of the pulmonary
arteries. There is no central pulmonary embolism or definitive
distal pulmonary embolism.The thoracic aorta is unremarkable.

Mediastinum/Nodes: No lymphadenopathy.The thyroid is
unremarkable.Esophagus is unremarkable.The trachea is unremarkable.

Lungs/Pleura: No focal airspace consolidation.Stable 6 mm solid
right middle lobe pulmonary nodule (series 6, image 60). There are
no new suspicious pulmonary nodules. Mild apical predominant
centrilobular emphysema.No pleural effusion.No pneumothorax.

Upper Abdomen: No acute abnormality.

Musculoskeletal: Similar appearance of the lytic destructive lesion
involving the left lateral fifth rib, with adjacent soft tissue
thickening and likely pathologic fractures. Note that there is a
typo on the prior exam stating this was the fourth rib.Similar
multiple lytic lesions throughout the visualized axial and
appendicular skeleton.

Review of the MIP images confirms the above findings.
IMPRESSION: No evidence of central pulmonary embolism or definitive distal
pulmonary embolism. Respiratory motion artifact limits evaluation of
the lower lobe segmental and subsegmental vessels.

No focal airspace consolidation.

Unchanged 6 mm right middle lobe pulmonary nodule for which a 3-6
month follow-up chest CT is recommended.

Unchanged appearance of the lytic destructive lesion involving the
left lateral fifth rib, with associated soft tissue thickening and
pathologic fracture. Unchanged numerous lytic lesions throughout the
visualized axial and appendicular skeleton. Findings are again
concerning for lytic metastases or myeloma.

## 2021-02-08 IMAGING — CR DG CHEST 2V
2 series · 2 of 2 positions shown · non-contrast
Comparison: Chest x-ray [DATE].

CLINICAL DATA: 65-year-old male with history of shortness of breath
and chest pain.

EXAM:
CHEST - 2 VIEW

[w chest pa]
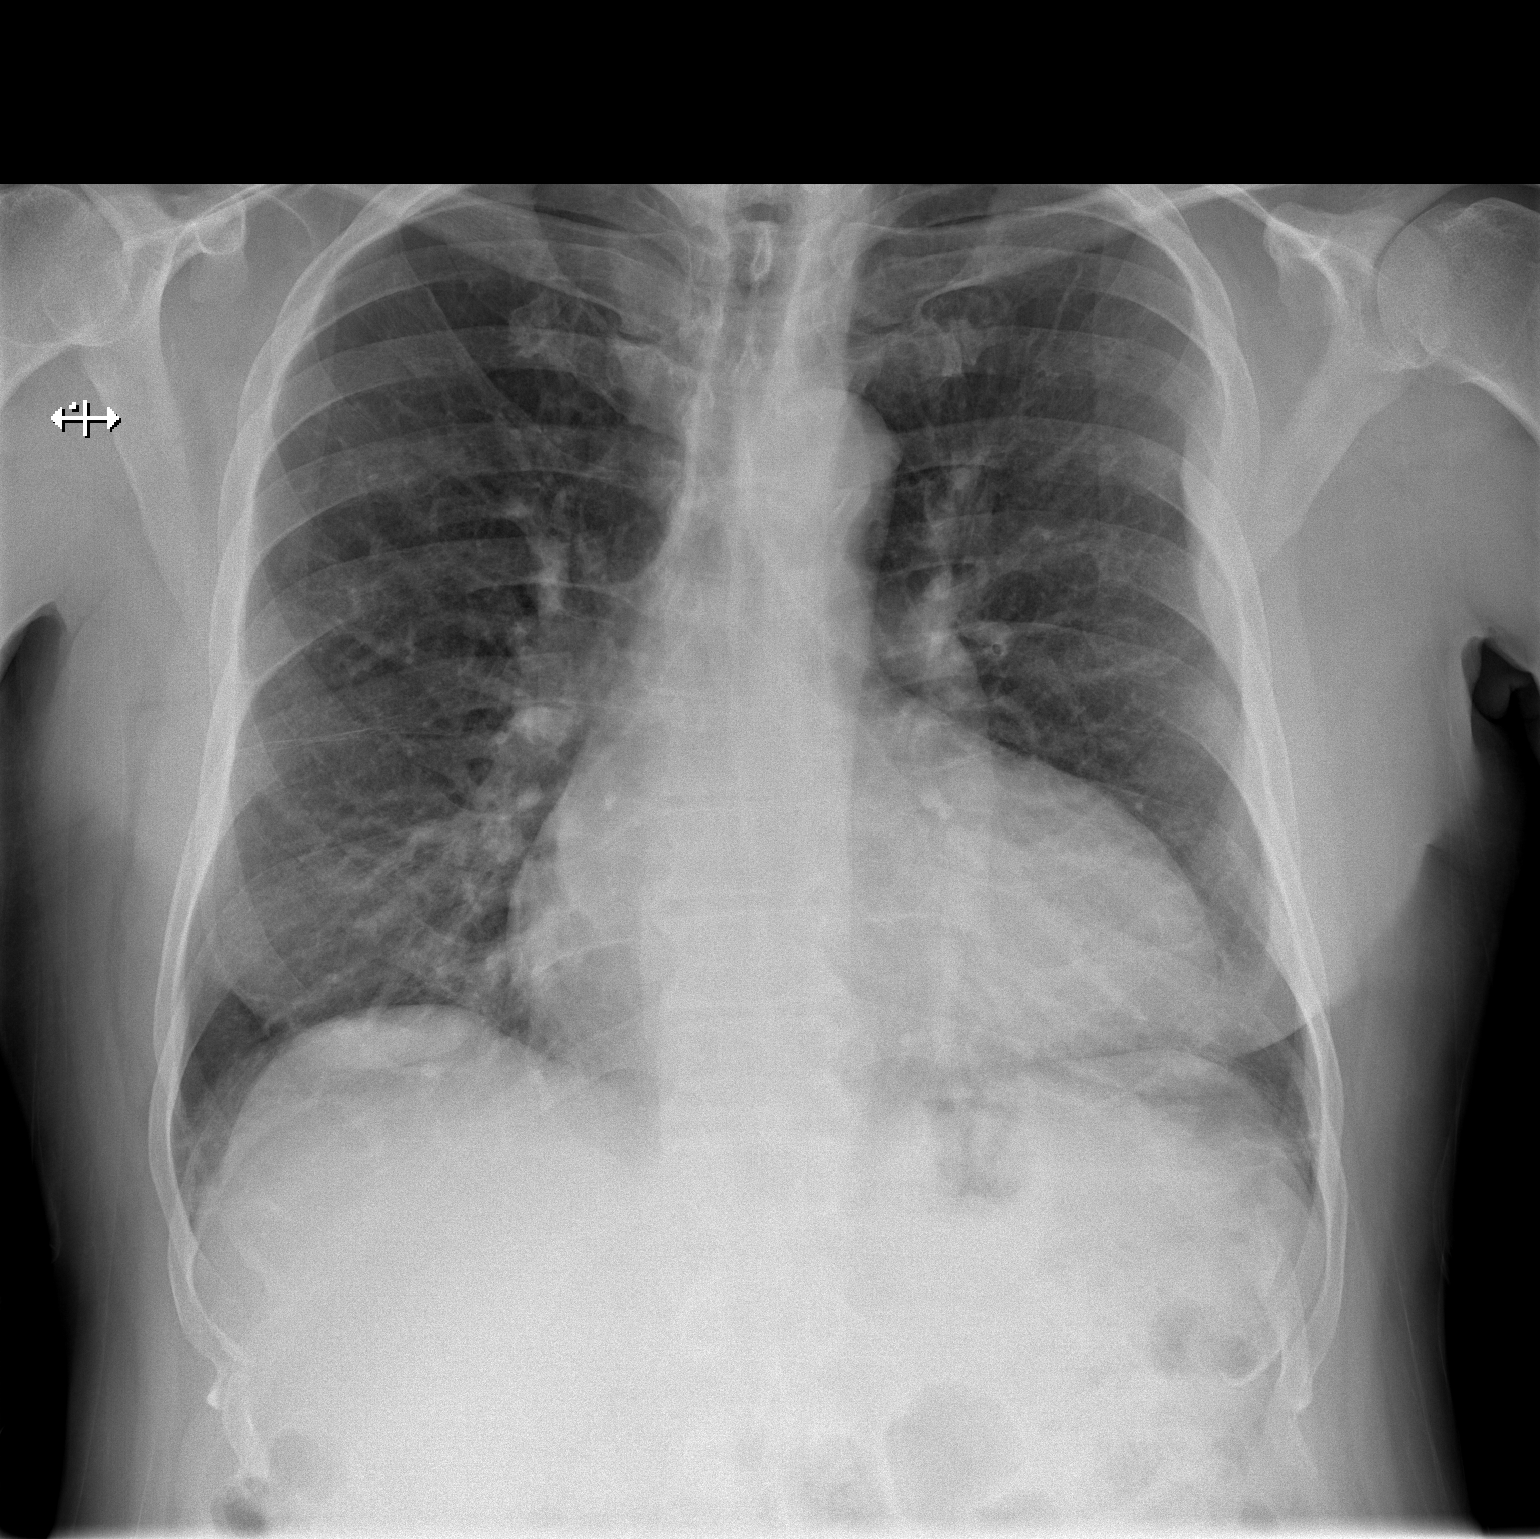

[w chest lat]
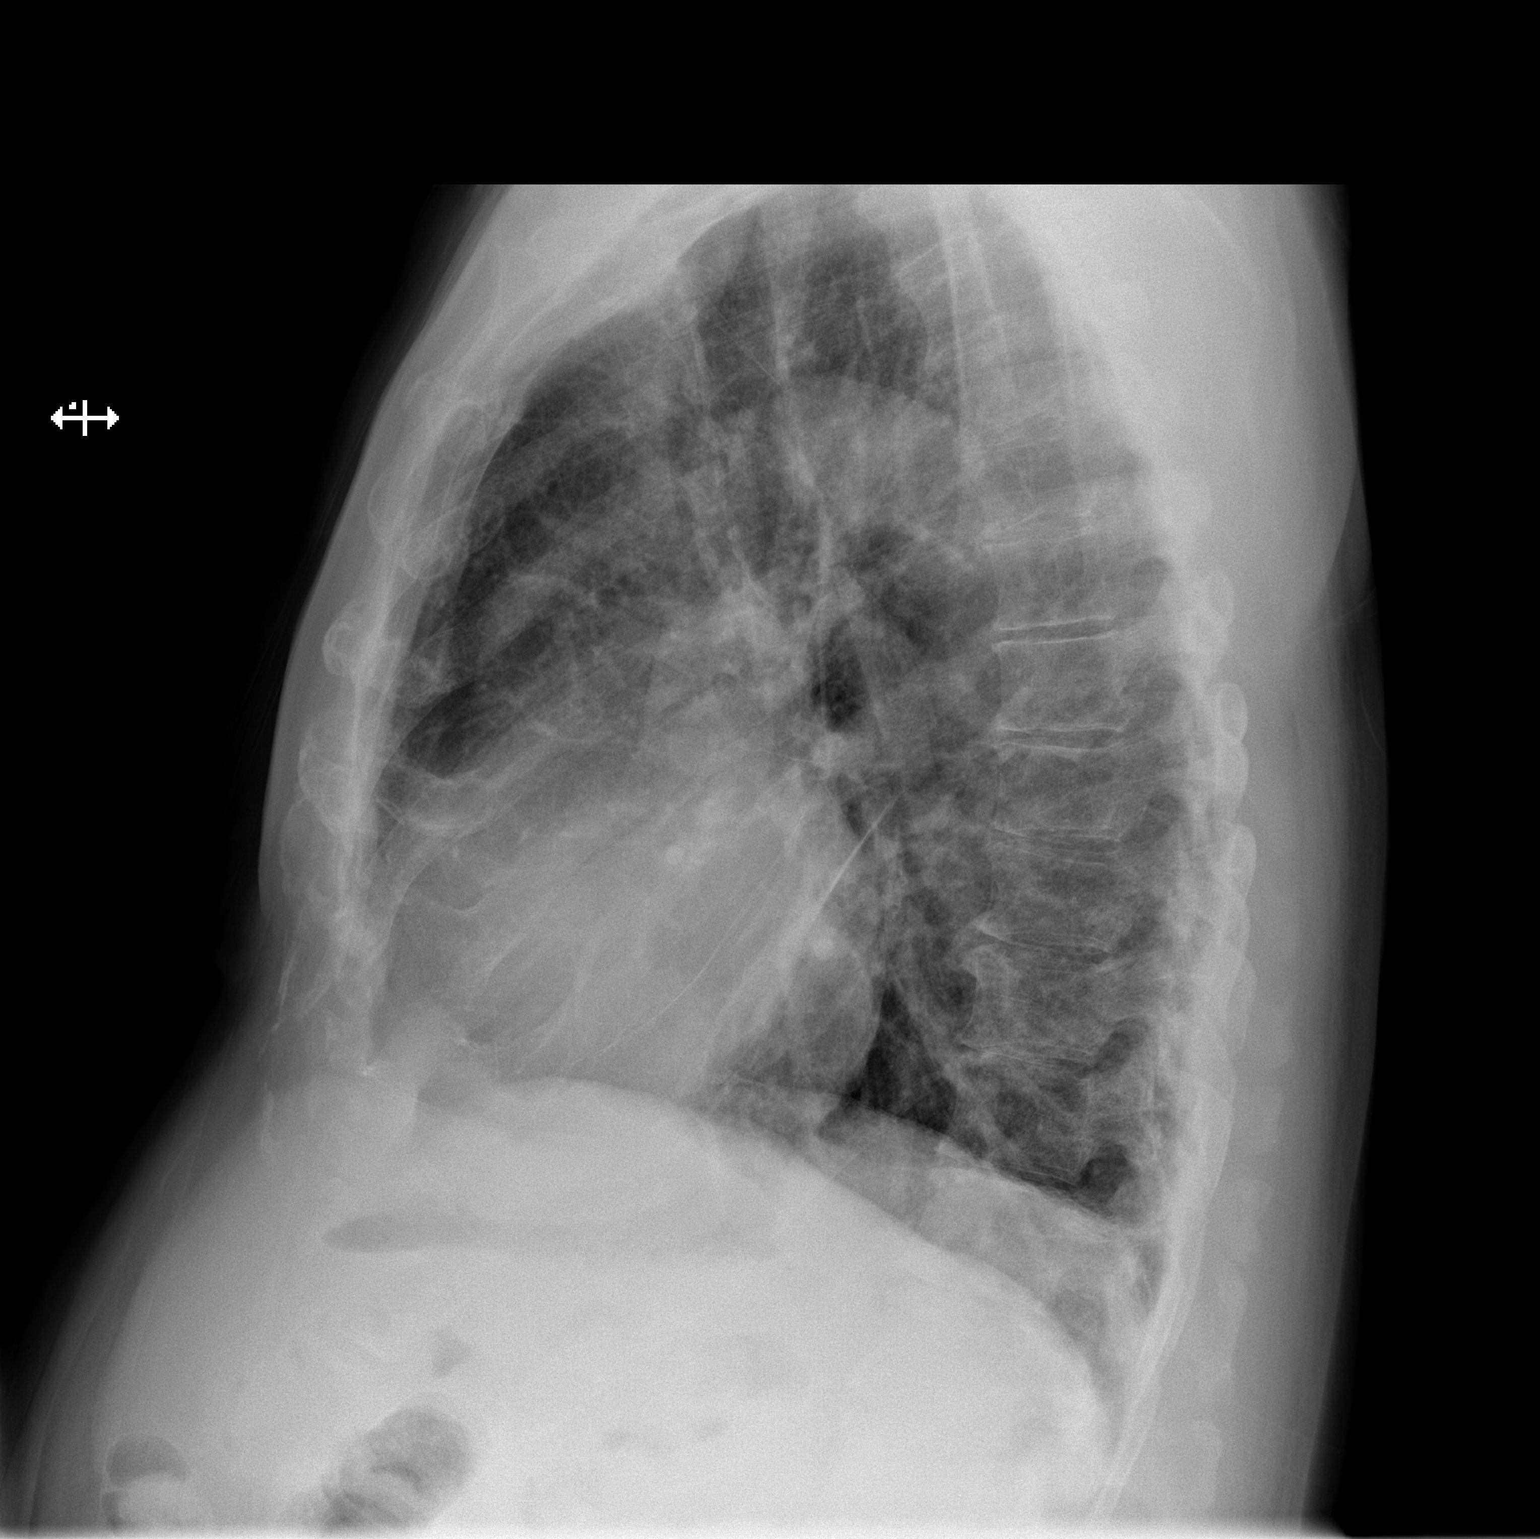

[2 of 2 positions shown; findings below may reference images not displayed]

FINDINGS: Again noted is a lytic lesion in the lateral aspect of the left
fifth rib with adjacent soft tissue thickening along the lateral
aspect of the pleura in the medial left hemithorax. Lungs appear
clear. No pleural effusions. No pneumothorax. No evidence of
pulmonary edema. Heart size is mildly enlarged. Upper mediastinal
contours are within normal limits. Atherosclerotic calcifications in
the thoracic aorta.
IMPRESSION: 1. No new acute findings are noted.
2. Lytic lesion in the lateral aspect of the left fifth rib with
adjacent pleural thickening, similar to prior chest x-ray. Findings
are better demonstrated on recent chest CT.
3. Aortic atherosclerosis.
4. Mild cardiomegaly.

## 2021-02-08 MED ORDER — HYDRALAZINE HCL 50 MG PO TABS
50.0000 mg | ORAL_TABLET | Freq: Three times a day (TID) | ORAL | 2 refills | Status: DC
  Filled 2021-02-08 (×2): qty 90, 30d supply, fill #0

## 2021-02-08 MED ORDER — SODIUM CHLORIDE (PF) 0.9 % IJ SOLN
INTRAMUSCULAR | Status: AC
  Filled 2021-02-08: qty 50

## 2021-02-08 MED ORDER — HYDRALAZINE HCL 25 MG PO TABS
50.0000 mg | ORAL_TABLET | Freq: Once | ORAL | Status: AC
  Administered 2021-02-08: 50 mg via ORAL
  Filled 2021-02-08: qty 2

## 2021-02-08 MED ORDER — METOPROLOL SUCCINATE ER 100 MG PO TB24
100.0000 mg | ORAL_TABLET | Freq: Every day | ORAL | 2 refills | Status: DC
  Filled 2021-02-08 (×2): qty 30, 30d supply, fill #0

## 2021-02-08 MED ORDER — AMLODIPINE BESYLATE 10 MG PO TABS
10.0000 mg | ORAL_TABLET | Freq: Every day | ORAL | 2 refills | Status: DC
  Filled 2021-02-08 (×2): qty 30, 30d supply, fill #0

## 2021-02-08 MED ORDER — IOHEXOL 350 MG/ML SOLN
80.0000 mL | Freq: Once | INTRAVENOUS | Status: AC | PRN
  Administered 2021-02-08: 75 mL via INTRAVENOUS

## 2021-02-08 MED ORDER — AMLODIPINE BESYLATE 5 MG PO TABS
10.0000 mg | ORAL_TABLET | Freq: Once | ORAL | Status: AC
  Administered 2021-02-08: 10 mg via ORAL
  Filled 2021-02-08: qty 2

## 2021-02-08 MED ORDER — OXYCODONE-ACETAMINOPHEN 5-325 MG PO TABS
1.0000 | ORAL_TABLET | Freq: Three times a day (TID) | ORAL | 0 refills | Status: DC | PRN
Start: 2021-02-08 — End: 2021-02-13
  Filled 2021-02-08: qty 20, 7d supply, fill #0

## 2021-02-08 MED ORDER — ALBUTEROL SULFATE (2.5 MG/3ML) 0.083% IN NEBU
5.0000 mg | INHALATION_SOLUTION | Freq: Once | RESPIRATORY_TRACT | Status: AC
  Administered 2021-02-08: 5 mg via RESPIRATORY_TRACT
  Filled 2021-02-08: qty 6

## 2021-02-08 NOTE — ED Provider Notes (Signed)
Signout from [place] PA-C at shift change.  Patient with history of prostate cancer presenting with chest pain, currently awaiting work-up.  Refusing IV but allowing blood draws.  Awaiting troponins.  Will need reassessment.  Patient seen by both Dr. Doren Custard and myself.  Troponins are elevated but flat.  After extensive conversation, patient agreed to CT angiography to evaluate for blood clot.  Right upper extremity peripheral IV placed by Dr. Doren Custard under ultrasound guidance.  Fortunately, no signs of pulmonary embolism.  Patient has extensive, likely metastatic bony lesions involving the spine, fifth left rib, amongst other places.  Patient does endorse having some back pain, worse with movement and positions recently.  He tells me that he was diagnosed with prostate cancer back in 2018.  When asked about treatments for this, he states that he was trying to eat better and went to the [facility].  At this sounds as though he had ever had any surgical procedures, chemotherapy, radiation or other treatments.  Currently the patient lives here alone without family in the area.  He does not have a means of transportation.  He has not had the financial means to pick up his blood pressure medications were recently prescribed pain medication.  It appears that he canceled a recent urology appointment based on his concerns.  I can see in epic that patient has been contacted by oncology regarding an appointment scheduled next Monday.  Patient states that he received some messages yesterday.  We discussed that it is very important that he keeps this appointment.   Prior to discharge, will request input from social work regarding patient's financial, medicine and transportation difficulties.  We will give a dose of amlodipine and hydralazine.  BP (!) 159/82   Pulse 70   Temp 98.9 F (37.2 C) (Oral)   Resp 16   SpO2 97%   3:39 PM ordered patient's blood pressure and pain medications.  Case manager and social  worker will obtain patient's medications today and arrange for transport to home and set up for transport for future appointments.  Their help is much appreciated.  Plan for discharge to home once these are completed.        Carlisle Cater, PA-C 02/08/21 1542    Godfrey Pick, MD 02/08/21 1904

## 2021-02-08 NOTE — ED Provider Notes (Signed)
[facility] DEPT Provider Note   CSN: [id number] Arrival date & time: 02/07/21  2249     History Chief Complaint  Patient presents with   Hypertension    Randall Larsen. is a 65 y.o. male resents to the emergency department with complaints of chest pain and shortness of breath.  Triage note states that patient was brought in by EMS for hypertension.  He reported to them and to triage that he is not taking his medication since Sunday evening due to financial reasons.  He clarifies for me that he has not taken his hypertension medications in many years.  He did not fill his oxycodone which was written on Sunday due to financial reasons.  Patient reports his afternoon he developed shortness of breath and chest pain.  Patient reports this is worsened while he is here in the emergency department.  Denies fevers or chills, nausea or vomiting.  No aggravating or alleviating factors.  Patient is a current smoker and reports he smoked several cigarettes back-to-back before developing the shortness of breath.  Of note, patient with a history of prostate cancer.  During his evaluation for chest pain on Sunday he had a CT scan of his chest without contrast which showed numerous lesions.  Record also shows that oncology attempted to contact the patient for an appointment this week but he did not answer the phone nor did he call them back.  Patient refused IV on Sunday and continues to refuse IV today.  Is generally a poor historian.  Agitated with history taking questions stating over and over that he is hungry and thirsty.  The history is provided by the patient and medical records. No language interpreter was used.      Past Medical History:  Diagnosis Date   Hypertension     There are no problems to display for this patient.   History reviewed. No pertinent surgical history.     History reviewed. No pertinent family history.  Social History   Tobacco Use    Smoking status: Every Day    Packs/day: 0.50    Types: Cigarettes  Substance Use Topics   Alcohol use: Not Currently   Drug use: Not Currently    Home Medications Prior to Admission medications   Medication Sig Start Date End Date Taking? Authorizing Provider  Multiple Vitamins-Minerals (ONE-A-DAY MENS 50+) TABS Take 1 tablet by mouth daily.   Yes [provider]  naproxen (NAPROSYN) 500 MG tablet Take 500 mg by mouth 2 (two) times daily as needed for moderate pain. 01/04/21  Yes [provider]  amLODipine (NORVASC) 10 MG tablet Take 10 mg by mouth daily. 12/29/20   [provider]  docusate sodium (COLACE) 100 MG capsule Take 1 capsule (100 mg total) by mouth every 12 (twelve) hours. 02/05/21   Mesner, Corene Cornea, MD  hydrALAZINE (APRESOLINE) 50 MG tablet Take 50 mg by mouth 3 (three) times daily. 02/03/21   [provider]  metoprolol succinate (TOPROL-XL) 100 MG 24 hr tablet Take 100 mg by mouth daily. 02/03/21   [provider]  oxyCODONE-acetaminophen (PERCOCET) 5-325 MG tablet Take 1 tablet by mouth every 8 (eight) hours as needed for severe pain. 02/05/21   Mesner, Corene Cornea, MD  sildenafil (REVATIO) 20 MG tablet Take 20 mg by mouth daily as needed (ED). 07/18/18   [provider]    Allergies    Patient has no known allergies.  Review of Systems   Review of Systems  Constitutional:  Negative for appetite change, diaphoresis, fatigue, fever and unexpected weight change.  HENT:  Negative for mouth sores.   Eyes:  Negative for visual disturbance.  Respiratory:  Positive for chest tightness and shortness of breath. Negative for cough and wheezing.   Cardiovascular:  Positive for chest pain.  Gastrointestinal:  Negative for abdominal pain, constipation, diarrhea, nausea and vomiting.  Endocrine: Negative for polydipsia, polyphagia and polyuria.  Genitourinary:  Negative for dysuria, frequency, hematuria and urgency.  Musculoskeletal:   Negative for back pain and neck stiffness.  Skin:  Negative for rash.  Allergic/Immunologic: Negative for immunocompromised state.  Neurological:  Negative for syncope, light-headedness and headaches.  Hematological:  Does not bruise/bleed easily.  Psychiatric/Behavioral:  Negative for sleep disturbance. The patient is not nervous/anxious.    Physical Exam Updated Vital Signs BP (!) 165/80 (BP Location: Left Arm)   Pulse 66   Temp 98.9 F (37.2 C) (Oral)   Resp 18   SpO2 99%   Physical Exam Vitals and nursing note reviewed.  Constitutional:      General: He is not in acute distress.    Appearance: He is not diaphoretic.  HENT:     Head: Normocephalic.  Eyes:     General: No scleral icterus.    Conjunctiva/sclera: Conjunctivae normal.  Cardiovascular:     Rate and Rhythm: Normal rate and regular rhythm.     Pulses: Normal pulses.          Radial pulses are 2+ on the right side and 2+ on the left side.  Pulmonary:     Effort: Pulmonary effort is normal. No tachypnea, accessory muscle usage, prolonged expiration, respiratory distress or retractions.     Breath sounds: No stridor.     Comments: Equal chest rise. No increased work of breathing. Congested cough.  Coarse breath sounds throughout. Abdominal:     General: There is no distension.     Palpations: Abdomen is soft.     Tenderness: There is no abdominal tenderness. There is no guarding or rebound.  Musculoskeletal:     Cervical back: Normal range of motion.     Comments: Moves all extremities equally and without difficulty.  Skin:    General: Skin is warm and dry.     Capillary Refill: Capillary refill takes less than 2 seconds.  Neurological:     Mental Status: He is alert.     GCS: GCS eye subscore is 4. GCS verbal subscore is 5. GCS motor subscore is 6.     Comments: Speech is clear and goal oriented.  Psychiatric:        Mood and Affect: Mood normal.    ED Results / Procedures / Treatments       Procedures Procedures   Medications Ordered in ED Medications  albuterol (PROVENTIL) (2.5 MG/3ML) 0.083% nebulizer solution 5 mg (has no administration in time range)    ED Course  I have reviewed the triage vital signs and the nursing notes.  Pertinent labs & imaging results that were available during my care of the patient were reviewed by me and considered in my medical decision making (see chart for details).    MDM Rules/Calculators/A&P                           Patient presents with chest pain and shortness of breath.  Known history of cancer with lytic lesions in the chest.  Patient again refusing IV and CT  scan.  Given his ongoing chest pain and shortness of breath I have high concern for pulmonary embolism.  I have discussed this with the patient.  I have discussed that this is a potential life-threatening diagnosis if not treated.  Also discussed that I cannot treat for PE without diagnosis.  Patient is adamant that he will not have IV regardless.  He will allow for blood draw but will not under any circumstance have an IV.  Given his smoking history and coarse breath sounds we will also give albuterol.  Work-up initiated.  You are transferred to the day team for ongoing management.  They will follow labs and determine disposition.  Care transferred to [facility], PA-C.   Final Clinical Impression(s) / ED Diagnoses Final diagnoses:  Shortness of breath  Chest pain, unspecified type    Rx / DC Orders ED Discharge Orders     None        Izzabelle Bouley, Gwenlyn Perking 02/08/21 2179    Ripley Fraise, MD 02/08/21 [phone]

## 2021-02-08 NOTE — ED Notes (Signed)
Pt was brought to Hallway D, but the patient declined to be put in the hallway and preferred to wait until a room is available.

## 2021-02-08 NOTE — ED Notes (Signed)
Pt refused IV insertion

## 2021-02-08 NOTE — Progress Notes (Addendum)
Transition of Care [facility]) - Emergency Department Mini Assessment   Patient Details  Name: Randall Larsen. MRN: [id number] Date of Birth: 11-14-1955  [facility] of [facility]) CM/SW Contact:    Illene Regulus, LCSW Phone Number: 02/08/2021, 2:23 PM   Clinical Narrative:  CSW spoke with pt, he stated he was d/c on Sunday from [facility], with prescriptions, he stated he is unable to afford the Meds due to being on a fixed income. Pt stated he gets money on the third of the month. Pt stated concerns with transportation to doctor appointments. Pt stated he lives in an area where everything he needs is within walking distance. Pt stated he uses the bus however, he is unable to use it to get to his appointments. Pt has contacted Social Services to inquire about Medicaid and transportation services, he stated he is waiting on a callback. CSW informed pt he does not meet the criteria for the medication assistance program as he has insurance. CSW attempted to contact pt's oncology office to assist pt with transportation to his appointment, and left VM. CSW arranged ride with [facility] transport to pt's oncology appointment on Monday at 10:30 am. CSW informed pt he can now arrange his own ride to and from the cancer center through [employer].   Adden CSW spoke with [facility], pt medication has been sent to [facility] long outpatient pharmacy. CSW picked pt medication up and dropped off with pt, pt was provided with taxi  voucher for ride home.     Arlie Solomons.Jarious Lyon, MSW, [place]  Transitions of Care Clinical Social Worker I Direct Dial: [phone]  Fax: [phone] [email or web address]    ED Mini Assessment: What brought you to the Emergency Department? : Blood Pressure  Barriers to Discharge: No Barriers Identified     Means of departure: Car  Interventions which prevented an admission or readmission: Transportation Screening,  Medication Review    Patient Contact and Communications        ,                 Admission diagnosis:  HTN There are no problems to display for this patient.  PCP:  [name], MD Pharmacy:   [facility] [place], [place] - [place] [facility] [address] [place] [address] Phone: [phone] Fax: [phone]

## 2021-02-08 NOTE — Discharge Instructions (Addendum)
Your work-up today was negative for blood clot in your lungs however you have many different spots on the bone.  This is concerning for spread of your prostate cancer but needs to be fully evaluated by a cancer doctor (oncologist).  It is very important that you take your medications and follow-up with the oncologist as planned on Monday.  You have an appointment scheduled as listed.  Please call the oncology clinic at [phone] to confirm the appointment on 11/7 at 11am.

## 2021-02-10 ENCOUNTER — Telehealth: Payer: Self-pay | Admitting: *Deleted

## 2021-02-10 ENCOUNTER — Telehealth: Payer: Self-pay

## 2021-02-10 NOTE — Telephone Encounter (Addendum)
T/C from pt with concerns about his transportation to his 02/13/21 appt with Dede Query, PA-C.  Per pt, [facility] is to call him on Monday at 10:30 and his appt is at 11:00   Spoke with Alecia at [phone] Margreta Journey had left for the day) and she gave me a phone number [phone] for Mr Uemura to call to make arrangements for Monday. She will fax all necessary paperwork.  L/M for pt with number to call.

## 2021-02-13 ENCOUNTER — Inpatient Hospital Stay: Payer: Medicare Other

## 2021-02-13 ENCOUNTER — Inpatient Hospital Stay: Payer: Medicare Other | Attending: Physician Assistant | Admitting: Physician Assistant

## 2021-02-13 ENCOUNTER — Other Ambulatory Visit: Payer: Self-pay

## 2021-02-13 ENCOUNTER — Encounter: Payer: Self-pay | Admitting: Physician Assistant

## 2021-02-13 VITALS — BP 145/79 | HR 71 | Temp 99.0°F | Resp 18 | Ht 71.0 in | Wt 222.7 lb

## 2021-02-13 DIAGNOSIS — M899 Disorder of bone, unspecified: Secondary | ICD-10-CM

## 2021-02-13 DIAGNOSIS — C61 Malignant neoplasm of prostate: Secondary | ICD-10-CM | POA: Diagnosis present

## 2021-02-13 DIAGNOSIS — F17210 Nicotine dependence, cigarettes, uncomplicated: Secondary | ICD-10-CM | POA: Insufficient documentation

## 2021-02-13 DIAGNOSIS — I119 Hypertensive heart disease without heart failure: Secondary | ICD-10-CM | POA: Diagnosis not present

## 2021-02-13 LAB — CBC WITH DIFFERENTIAL (CANCER CENTER ONLY)
Abs Immature Granulocytes: 0.08 10*3/uL — ABNORMAL HIGH (ref 0.00–0.07)
Basophils Absolute: 0.1 10*3/uL (ref 0.0–0.1)
Basophils Relative: 1 %
Eosinophils Absolute: 0.1 10*3/uL (ref 0.0–0.5)
Eosinophils Relative: 1 %
HCT: 47.1 % (ref 39.0–52.0)
Hemoglobin: 16.1 g/dL (ref 13.0–17.0)
Immature Granulocytes: 1 %
Lymphocytes Relative: 22 %
Lymphs Abs: 2.3 10*3/uL (ref 0.7–4.0)
MCH: 30 pg (ref 26.0–34.0)
MCHC: 34.2 g/dL (ref 30.0–36.0)
MCV: 87.7 fL (ref 80.0–100.0)
Monocytes Absolute: 0.8 10*3/uL (ref 0.1–1.0)
Monocytes Relative: 8 %
Neutro Abs: 7.2 10*3/uL (ref 1.7–7.7)
Neutrophils Relative %: 67 %
Platelet Count: 226 10*3/uL (ref 150–400)
RBC: 5.37 MIL/uL (ref 4.22–5.81)
RDW: 15.2 % (ref 11.5–15.5)
WBC Count: 10.5 10*3/uL (ref 4.0–10.5)
nRBC: 0 % (ref 0.0–0.2)

## 2021-02-13 LAB — CMP (CANCER CENTER ONLY)
ALT: 17 U/L (ref 0–44)
AST: 17 U/L (ref 15–41)
Albumin: 4.2 g/dL (ref 3.5–5.0)
Alkaline Phosphatase: 121 U/L (ref 38–126)
Anion gap: 8 (ref 5–15)
BUN: 16 mg/dL (ref 8–23)
CO2: 28 mmol/L (ref 22–32)
Calcium: 9.9 mg/dL (ref 8.9–10.3)
Chloride: 106 mmol/L (ref 98–111)
Creatinine: 1.19 mg/dL (ref 0.61–1.24)
GFR, Estimated: >60 mL/min (ref >60)
Glucose, Bld: 111 mg/dL — ABNORMAL HIGH (ref 70–99)
Potassium: 3.7 mmol/L (ref 3.5–5.1)
Sodium: 142 mmol/L (ref 135–145)
Total Bilirubin: 0.6 mg/dL (ref 0.3–1.2)
Total Protein: 7.1 g/dL (ref 6.5–8.1)

## 2021-02-13 MED ORDER — OXYCODONE HCL 10 MG PO TABS: 10.0000 mg | ORAL_TABLET | Freq: Three times a day (TID) | ORAL | 0 refills | Status: DC | PRN

## 2021-02-13 NOTE — Progress Notes (Signed)
[place] [phone]   [phone]  INITIAL CONSULTATION:  Patient Care Team: Randall Bill, MD as PCP - General (Family Medicine)  CHIEF COMPLAINTS/PURPOSE OF CONSULTATION:  Multifocal lytic lesions  HISTORY OF PRESENTING ILLNESS:  Randall Larsen. 65 y.o. male with medical history significant for hypertension and prostate cancer. He is unaccompanied for this visit.   On review of the previous records, Randall Larsen was diagnosed with prostate cancer  at [facility] in [place] after presenting with elevated PSA level in October 2018. He underwent TRUS-guided prostate biopsy on 03/28/2018. 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 CT scan from 04/16/2017 showed no evidence of lymphadenopathy but there were several sub-centimeter sclerotic foci in the pelvic bones that are favored to be bone islands. Bone scan on 05/09/2017 did no show bone metastases. The recommendation was to undergo cystectomy with adjuvant radiation therapy and ADT. He did not pursue any treatment for his prostate cancer.   Patient presented to our ED on 10//2022 for chest pain. CT imaging showed destructive lesion involving the left lateral 4th rib with associated pathologic fracture. Additionally, there are numerous lytic lesions throughout the axilla and appendicular skeleton.   On exam today, Randall Larsen reports that his energy levels are fairly stable. He has fatigue that is chronic but he continues to complete his daily activities independently. He reports 20 lb weight loss over the last 3-4 months. He adds that some of his weight loss is intentional due to exercising at the [facility] and eating healthier but he adds that some is unintentional. He denies nausea, vomiting or abdominal pain. He continues to have pain involving his left side of the rib, bilateral hips and sternum all mainly with  changing positions. He currently takes Percocet 5-325 mg every 8 hours with no relief. He adds that since he started taking pain medication, he has developed constipation. He was unable to afford both his pain medication and stool softener. He tried some green tea yesterday which gave him a small bowel movement. He denies any signs of bleeding or bruising. He denies any urinary symptoms. He denies any fevers, chills, night sweats, shortness of breath, or cough. He has no other complaints. Rest of the 10 point ROS is below.   MEDICAL HISTORY:  Past Medical History:  Diagnosis Date   Hypertension    Prostate cancer ([place])     SURGICAL HISTORY: History reviewed. No pertinent surgical history.  SOCIAL HISTORY: Social History   Socioeconomic History   Marital status: Married    Spouse name: Not on file   Number of children: Not on file   Years of education: Not on file   Highest education level: Not on file  Occupational History   Not on file  Tobacco Use   Smoking status: Every Day    Packs/day: 0.50    Years: 50.00    Pack years: 25.00    Types: Cigarettes   Smokeless tobacco: Never  Substance and Sexual Activity   Alcohol use: Not Currently    Comment: last drink was 2 weeks ago   Drug use: Not Currently   Sexual activity: Not on file  Other Topics Concern   Not on file  Social History Narrative   Not on file   Social Determinants of Health   Financial Resource Strain: Not on file  Food Insecurity: Not on file  Transportation Needs: Not  on file  Physical Activity: Not on file  Stress: Not on file  Social Connections: Not on file  Intimate Partner Violence: Not on file    FAMILY HISTORY: History reviewed. No pertinent family history.  ALLERGIES:  has No Known Allergies.  MEDICATIONS:  Current Outpatient Medications  Medication Sig Dispense Refill   amLODipine (NORVASC) 10 MG tablet Take 1 tablet (10 mg total) by mouth daily. 30 tablet 2   hydrALAZINE  (APRESOLINE) 50 MG tablet Take 1 tablet (50 mg total) by mouth 3 (three) times daily. 90 tablet 2   metoprolol succinate (TOPROL-XL) 100 MG 24 hr tablet Take 1 tablet (100 mg total) by mouth daily. 30 tablet 2   Multiple Vitamins-Minerals (ONE-A-DAY MENS 50+) TABS Take 1 tablet by mouth daily.     naproxen (NAPROSYN) 500 MG tablet Take 500 mg by mouth 2 (two) times daily as needed for moderate pain.     oxyCODONE 10 MG TABS Take 1 tablet (10 mg total) by mouth every 8 (eight) hours as needed for pain. 90 tablet 0   docusate sodium (COLACE) 100 MG capsule Take 1 capsule (100 mg total) by mouth every 12 (twelve) hours. (Patient not taking: Reported on 02/13/2021) 60 capsule 0   sildenafil (REVATIO) 20 MG tablet Take 20 mg by mouth daily as needed (ED). (Patient not taking: Reported on 02/13/2021)     No current facility-administered medications for this visit.    REVIEW OF SYSTEMS:   Constitutional: ( - ) fevers, ( - )  chills , ( - ) night sweats Eyes: ( - ) blurriness of vision, ( - ) double vision, ( - ) watery eyes Ears, nose, mouth, throat, and face: ( - ) mucositis, ( - ) sore throat Respiratory: ( - ) cough, ( - ) dyspnea, ( - ) wheezes Cardiovascular: ( - ) palpitation, ( - ) chest discomfort, ( - ) lower extremity swelling Gastrointestinal:  ( - ) nausea, ( - ) heartburn, ( + ) change in bowel habits Skin: ( - ) abnormal skin rashes Lymphatics: ( - ) new lymphadenopathy, ( - ) easy bruising Neurological: ( - ) numbness, ( - ) tingling, ( - ) new weaknesses Behavioral/Psych: ( - ) mood change, ( - ) new changes  All other systems were reviewed with the patient and are negative.  PHYSICAL EXAMINATION: ECOG PERFORMANCE STATUS: 1 - Symptomatic but completely ambulatory  Vitals:   02/13/21 1041  BP: (!) 145/79  Pulse: 71  Resp: 18  Temp: 99 F (37.2 C)  SpO2: 100%   Filed Weights   02/13/21 1041  Weight: 222 lb 11.2 oz (101 kg)    GENERAL: well appearing male in NAD  SKIN:  skin color, texture, turgor are normal, no rashes or significant lesions EYES: conjunctiva are pink and non-injected, sclera clear OROPHARYNX: no exudate, no erythema; lips, buccal mucosa, and tongue normal  NECK: supple, non-tender LYMPH:  no palpable lymphadenopathy in the cervical, axillary or supraclavicular lymph nodes.  LUNGS: clear to auscultation and percussion with normal breathing effort HEART: regular rate & rhythm and no murmurs and no lower extremity edema ABDOMEN: soft, non-tender, non-distended, normal bowel sounds Musculoskeletal: no cyanosis of digits and no clubbing  PSYCH: alert & oriented x 3, fluent speech NEURO: no focal motor/sensory deficits  LABORATORY DATA:  I have reviewed the data as listed CBC Latest Ref Rng & Units 02/13/2021 02/08/2021 02/04/2021  WBC 4.0 - 10.5 K/uL 10.5 11.2(H) 12.6(H)  Hemoglobin 13.0 -  17.0 g/dL 16.1 15.8 16.3  Hematocrit 39.0 - 52.0 % 47.1 46.9 45.8  Platelets 150 - 400 K/uL 226 205 242    CMP Latest Ref Rng & Units 02/13/2021 02/08/2021 02/04/2021  Glucose 70 - 99 mg/dL 111(H) 108(H) 109(H)  BUN 8 - 23 mg/dL _0 Creatinine 0.61 - 1.24 mg/dL 1.19 1.34(H) 1.49(H)  Sodium 135 - 145 mmol/L 142 142 137  Potassium 3.5 - 5.1 mmol/L 3.7 3.4(L) 3.4(L)  Chloride 98 - 111 mmol/L 106 105 105  CO2 22 - 32 mmol/L _1 Calcium 8.9 - 10.3 mg/dL 9.9 9.8 9.6  Total Protein 6.5 - 8.1 g/dL 7.1 6.7 6.2(L)  Total Bilirubin 0.3 - 1.2 mg/dL 0.6 1.0 1.1  Alkaline Phos 38 - 126 U/L 121 104 83  AST 15 - 41 U/L _2 ALT 0 - 44 U/L _3 RADIOGRAPHIC STUDIES: I have personally reviewed the radiological images as listed and agreed with the findings in the report. DG Chest 2 View  Result Date: 02/08/2021 CLINICAL DATA:  65 year old male with history of shortness of breath and chest pain. EXAM: CHEST - 2 VIEW COMPARISON:  Chest x-ray 02/04/2021. FINDINGS: Again noted is a lytic lesion in the lateral aspect of the left fifth rib with  adjacent soft tissue thickening along the lateral aspect of the pleura in the medial left hemithorax. Lungs appear clear. No pleural effusions. No pneumothorax. No evidence of pulmonary edema. Heart size is mildly enlarged. Upper mediastinal contours are within normal limits. Atherosclerotic calcifications in the thoracic aorta. IMPRESSION: 1. No new acute findings are noted. 2. Lytic lesion in the lateral aspect of the left fifth rib with adjacent pleural thickening, similar to prior chest x-ray. Findings are better demonstrated on recent chest CT. 3. Aortic atherosclerosis. 4. Mild cardiomegaly. Electronically Signed   By: Vinnie Langton M.D.   On: 02/08/2021 07:10   DG Chest 2 View  Result Date: 02/04/2021 CLINICAL DATA:  Chest pain EXAM: CHEST - 2 VIEW COMPARISON:  None. FINDINGS: The lungs are symmetrically well expanded and are clear. There is left-sided pleural thickening laterally within the mid to upper lung zone with possible destruction of the lateral aspect of the left fifth rib no pneumothorax or pleural effusion. Cardiac size is mildly enlarged. Pulmonary vascularity is normal. IMPRESSION: Possible left chest wall mass with destruction of the left fifth rib laterally. Contrast enhanced CT examination of the chest would be helpful for further evaluation. Mild cardiomegaly Electronically Signed   By: Fidela Salisbury M.D.   On: 02/04/2021 21:40   CT Chest Wo Contrast  Result Date: 02/05/2021 CLINICAL DATA:  Abnormal chest radiograph, back pain EXAM: CT CHEST WITHOUT CONTRAST TECHNIQUE: Multidetector CT imaging of the chest was performed following the standard protocol without IV contrast. COMPARISON:  Chest radiograph dated 02/04/2021 FINDINGS: Cardiovascular: Mild cardiomegaly. Small inferior pericardial effusion. No evidence of thoracic aortic aneurysm. Mild atherosclerotic calcifications of the aortic arch. Three vessel coronary atherosclerosis. Mediastinum/Nodes: No suspicious mediastinal  lymphadenopathy. Visualized thyroid is unremarkable. Lungs/Pleura: 6 mm nodule in the medial right upper lobe (series 4/image 35). Lungs are otherwise clear. No focal consolidation. Mild centrilobular emphysematous changes, upper lung predominant. Minimal bibasilar atelectasis. No pleural effusion or pneumothorax. Upper Abdomen: Visualized upper abdomen is grossly unremarkable. Musculoskeletal: Destructive lesion involving the left lateral 4th rib with associated soft tissue (series 3/image 55), corresponding to the radiographic abnormality. This appearance favors a lytic metastasis with pathologic fracture (  series 3/image 62). Numerous lytic lesions throughout the visualized axial and appendicular skeleton, suggesting lytic metastases or less likely myeloma. Dedicated evaluation of the thoracic spine has been obtained and will be reported separately. IMPRESSION: Destructive lesion involving the left lateral 4th rib with associated soft tissue in pathologic fracture, corresponding to the radiographic abnormality. This appearance favors lytic metastasis. Numerous lytic lesions throughout the visualized axial and appendicular skeleton, suggesting liver metastases are less likely myeloma. 6 mm nodule in the medial right upper lobe. In the setting of suspected malignancy, Fleischner guidelines do not apply. Follow-up CT chest is suggested in 3-6 months. Dedicated evaluation of the thoracic spine has been obtained and will be reported separately. Aortic Atherosclerosis (ICD10-I70.0) and Emphysema (ICD10-J43.9). Electronically Signed   By: Julian Hy M.D.   On: 02/05/2021 02:06   CT Angio Chest PE W and/or Wo Contrast  Result Date: 02/08/2021 CLINICAL DATA:  PE suspected, high prob chest pain/SOB, bone lesions, r/o PE EXAM: CT ANGIOGRAPHY CHEST WITH CONTRAST TECHNIQUE: Multidetector CT imaging of the chest was performed using the standard protocol during bolus administration of intravenous contrast. Multiplanar  CT image reconstructions and MIPs were obtained to evaluate the vascular anatomy. CONTRAST:  23m OMNIPAQUE IOHEXOL 350 MG/ML SOLN COMPARISON:  Chest CT 02/05/2021 FINDINGS: Cardiovascular: Unchanged cardiomegaly. Coronary artery calcifications.No pericardial disease.There is respiratory motion artifact in the lung bases limiting evaluation of the pulmonary arteries. There is no central pulmonary embolism or definitive distal pulmonary embolism.The thoracic aorta is unremarkable. Mediastinum/Nodes: No lymphadenopathy.The thyroid is unremarkable.Esophagus is unremarkable.The trachea is unremarkable. Lungs/Pleura: No focal airspace consolidation.Stable 6 mm solid right middle lobe pulmonary nodule (series 6, image 60). There are no new suspicious pulmonary nodules. Mild apical predominant centrilobular emphysema.No pleural effusion.No pneumothorax. Upper Abdomen: No acute abnormality. Musculoskeletal: Similar appearance of the lytic destructive lesion involving the left lateral fifth rib, with adjacent soft tissue thickening and likely pathologic fractures. Note that there is a typo on the prior exam stating this was the fourth rib.Similar multiple lytic lesions throughout the visualized axial and appendicular skeleton. Review of the MIP images confirms the above findings. IMPRESSION: No evidence of central pulmonary embolism or definitive distal pulmonary embolism. Respiratory motion artifact limits evaluation of the lower lobe segmental and subsegmental vessels. No focal airspace consolidation. Unchanged 6 mm right middle lobe pulmonary nodule for which a 3-6 month follow-up chest CT is recommended. Unchanged appearance of the lytic destructive lesion involving the left lateral fifth rib, with associated soft tissue thickening and pathologic fracture. Unchanged numerous lytic lesions throughout the visualized axial and appendicular skeleton. Findings are again concerning for lytic metastases or myeloma.  Electronically Signed   By: JMaurine SimmeringM.D.   On: 02/08/2021 12:59   CT Thoracic Spine Wo Contrast  Result Date: 02/05/2021 CLINICAL DATA:  Abnormal chest radiograph, bone neoplasm suspected EXAM: CT Thoracic Spine without contrast TECHNIQUE: Multiplanar CT images of the thoracic spine were reconstructed from contemporary CT of the Chest. CONTRAST:  None COMPARISON:  None FINDINGS: Alignment: Normal thoracic kyphosis. Vertebrae: No evidence of fracture or dislocation. Vertebral body heights are maintained. Numerous lytic lesions with throughout the thoracic spine, including a dominant 19 mm lesion along the posterior aspect of the T8 vertebral body (series 5/image 89). Given the additional findings on CT chest, this appearance favors lytic metastases over myeloma. No evidence of pathologic fracture. Paraspinal and other soft tissues: Better evaluated on dedicated CT chest. Disc levels: Mild multilevel degenerative changes. Spinal canal is patent. IMPRESSION: Numerous lytic lesions  throughout the thoracic spine, favoring lytic metastases, less likely myeloma. No evidence of pathologic fracture. Electronically Signed   By: Julian Hy M.D.   On: 02/05/2021 02:12   CT Lumbar Spine Wo Contrast  Result Date: 02/05/2021 CLINICAL DATA:  Low back pain EXAM: CT LUMBAR SPINE WITHOUT CONTRAST TECHNIQUE: Multidetector CT imaging of the lumbar spine was performed without intravenous contrast administration. Multiplanar CT image reconstructions were also generated. COMPARISON:  None. FINDINGS: Segmentation: 5 lumbar type vertebral bodies. Alignment: Normal lumbar lordosis. Vertebrae: No evidence of fracture or dislocation. Vertebral body heights are maintained. Numerous lytic lesions throughout the lumbar spine, sacrum, and visualized pelvis, suggesting lytic metastases, less likely myeloma. Dominant 11 mm lesion along the superior aspect of the L3 vertebral body (series 4/image 86). Paraspinal and other soft  tissues: Unremarkable. Disc levels: Mild multilevel degenerative changes. Spinal canal is patent. IMPRESSION: Numerous lytic lesions throughout the visualized axial and appendicular skeleton, favoring lytic metastases, less likely myeloma. No evidence of pathologic fracture. Electronically Signed   By: Julian Hy M.D.   On: 02/05/2021 02:09    ASSESSMENT & PLAN Randall Larsen. Is a 65 y.o. male who presents to the diagnostic clinic for evaluation for multifocal lytic lesions. I reviewed differentials including metastases from previously diagnosed prostate cancer that was left untreated versus other malignant processes including multiple myeloma. Patient will proceed with laboratory evaluation today to check CBC, CMP, PSA, testosterone, SPEP with IFE and serum free light chains. Addittionally, we will complete staging with CT scan of the abdomen and pelvis.   If above workup is unremarkable, we will pursue a bone biopsy. Briefly discussed that if this is metastatic prostate cancer, the goal of therapy is to treat the cancer rather than cure. If PSA levels are elevated today, we will proceed with treatment which includes androgen deprivation therapy, specifically Lupron 22.5 mg q 3 months and Bicalutamide 50 mg daily. We will plan to add Abiraterone approximately 4 weeks after first Lupron injection.   We will plan for a tentative follow up in 2 weeks to start Lupron injection.   #Multifocal lytic lesions: --Concerning for metastatic prostate cancer as patient did not pursue therapy when originally diagnosed in 2018.  --Labs today to check CBC, CMP, PSA, testosterone, SPEP with IFE and sFLC. --Need to complete staging with CT abdomen/pelvis.  --If PSA level is elevated today, plan to start Bicalutamide 50 mg daily. --Plan to add Abiraterone approximately 4 weeks after first Lupron injection.  --RTC in 2 weeks with labs and to start Lupron injection  #Left rib pain and back pain: --Likely  secondary to lytic lesions --Currently on Percocet 5-325 mg q 8 hours with no relief --Recommend to discontinue Percocet and sent prescription for oxycodone 10 mg q 6 hours PRN.     Orders Placed This Encounter  Procedures   CBC with Differential ([place] Only)    Standing Status:   Future    Number of Occurrences:   1    Standing Expiration Date:   02/13/2022   CMP ([place] only)    Standing Status:   Future    Number of Occurrences:   1    Standing Expiration Date:   02/13/2022   Prostate-Specific AG, Serum    Standing Status:   Future    Number of Occurrences:   1    Standing Expiration Date:   02/13/2022   Testosterone, free, total    Standing Status:   Future  Number of Occurrences:   1    Standing Expiration Date:   02/13/2022   Multiple Myeloma Panel (SPEP&IFE w/QIG)    Standing Status:   Future    Number of Occurrences:   1    Standing Expiration Date:   02/13/2022   Kappa/lambda light chains    Standing Status:   Future    Number of Occurrences:   1    Standing Expiration Date:   02/13/2022    All questions were answered. The patient knows to call the clinic with any problems, questions or concerns.  I have spent a total of 60 minutes minutes of face-to-face and non-face-to-face time, preparing to see the patient, obtaining and/or reviewing separately obtained history, performing a medically appropriate examination, counseling and educating the patient, ordering medications/tests, documenting clinical information in the electronic health record, and care coordination.   Dede Query, PA-C Department of Hematology/Oncology [place] at [facility] Phone: [phone]  Patient was seen with Dr. Lorenso Courier.   I have read the above note and personally examined the patient. I agree with the assessment and plan as noted above.  Briefly Randall Larsen is a 65 year old male with previous diagnosis of Gleason 4+5 prostate cancer diagnosed in 2018  who presents for evaluation of new metastatic lesions of the bone.  The patient did not seek any treatment at the time of his initial diagnosis.  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  We will begin therapy with bicalutamide 50 mg p.o. daily x30 days.  After 2 weeks of bicalutamide therapy would recommend Lupron shot 22.5 mg.  He will continue the bicalutamide for the full course after that time.  We will plan to begin abiraterone therapy later in the course of his treatment.  We will also complete his staging studies with a CT of the abdomen pelvis as well as nuclear medicine bone scan.   Ledell Peoples, MD Department of Hematology/Oncology [place] at [facility] Phone: [phone] Pager: [phone] Email: [email or web address]

## 2021-02-14 ENCOUNTER — Encounter: Payer: Self-pay | Admitting: Physician Assistant

## 2021-02-14 ENCOUNTER — Telehealth: Payer: Self-pay | Admitting: General Practice

## 2021-02-14 ENCOUNTER — Other Ambulatory Visit: Payer: Self-pay | Admitting: Physician Assistant

## 2021-02-14 DIAGNOSIS — M899 Disorder of bone, unspecified: Secondary | ICD-10-CM | POA: Insufficient documentation

## 2021-02-14 DIAGNOSIS — C61 Malignant neoplasm of prostate: Secondary | ICD-10-CM | POA: Insufficient documentation

## 2021-02-14 DIAGNOSIS — C7951 Secondary malignant neoplasm of bone: Secondary | ICD-10-CM | POA: Insufficient documentation

## 2021-02-14 LAB — KAPPA/LAMBDA LIGHT CHAINS
Kappa free light chain: 27.9 mg/L — ABNORMAL HIGH (ref 3.3–19.4)
Kappa, lambda light chain ratio: 1.73 — ABNORMAL HIGH (ref 0.26–1.65)
Lambda free light chains: 16.1 mg/L (ref 5.7–26.3)

## 2021-02-14 LAB — PROSTATE-SPECIFIC AG, SERUM (LABCORP): Prostate Specific Ag, Serum: 494 ng/mL — ABNORMAL HIGH (ref 0.0–4.0)

## 2021-02-14 MED ORDER — BICALUTAMIDE 50 MG PO TABS: 50.0000 mg | ORAL_TABLET | Freq: Every day | ORAL | 0 refills | Status: DC

## 2021-02-14 NOTE — Telephone Encounter (Signed)
[facility] CSW Progress Notes  VM from patient, states he was told to call SW to ask for help affording his new prescription for pain medication.  States his pain is significant and he cannot afford to fill the medication until he receives his next retirement check.  CSW messaged treatment team for more details re his treatment plan, awaiting reply.  Edwyna Shell, LCSW Clinical Social Worker Phone:  [phone]

## 2021-02-15 ENCOUNTER — Telehealth: Payer: Self-pay

## 2021-02-15 NOTE — Telephone Encounter (Signed)
Randall Larsen called with concerns regarding getting the contrast for his CT scan on 11/17.  Pt was advised to arrive around 9:00 so he can pick up the contrast and start it by 9:30.  Pt with VU.

## 2021-02-16 ENCOUNTER — Telehealth: Payer: Self-pay | Admitting: Physician Assistant

## 2021-02-16 LAB — MULTIPLE MYELOMA PANEL, SERUM
Albumin SerPl Elph-Mcnc: 3.9 g/dL (ref 2.9–4.4)
Albumin/Glob SerPl: 1.4 (ref 0.7–1.7)
Alpha 1: 0.3 g/dL (ref 0.0–0.4)
Alpha2 Glob SerPl Elph-Mcnc: 0.8 g/dL (ref 0.4–1.0)
B-Globulin SerPl Elph-Mcnc: 0.9 g/dL (ref 0.7–1.3)
Gamma Glob SerPl Elph-Mcnc: 0.9 g/dL (ref 0.4–1.8)
Globulin, Total: 2.9 g/dL (ref 2.2–3.9)
IgA: 125 mg/dL (ref 61–437)
IgG (Immunoglobin G), Serum: 907 mg/dL (ref 603–1613)
IgM (Immunoglobulin M), Srm: 26 mg/dL (ref 20–172)
Total Protein ELP: 6.8 g/dL (ref 6.0–8.5)

## 2021-02-16 LAB — TESTOSTERONE, FREE, TOTAL, SHBG
Sex Hormone Binding: 51.8 nmol/L (ref 19.3–76.4)
Testosterone, Free: 10.5 pg/mL (ref 6.6–18.1)
Testosterone: 632 ng/dL (ref 264–916)

## 2021-02-16 NOTE — Telephone Encounter (Signed)
I called Randall Larsen to review the lab results from 02/13/2021.  SPEP with IFE still pending.  PSA levels have markedly increased from new 108.88 on 01/21/2020 to 494.0.  These results point to progression of prostate cancer with bone metastases.  I recommended for the patient to initiate ADT with  bicalutamide 50 mg p.o. daily x30 days.  Patient adds that he is on a fixed income so cannot pick up the prescription until next week.  He is scheduled for staging studies with CT scanning of the abdomen and pelvis along with a nuclear medicine bone scan on 02/23/2021. He will return to the clinic in approximately 3 weeks for follow-up with Dr. Lorenso Courier and to receive his first Lupron injection.

## 2021-02-17 ENCOUNTER — Encounter: Payer: Self-pay | Admitting: Physician Assistant

## 2021-02-17 ENCOUNTER — Other Ambulatory Visit (HOSPITAL_COMMUNITY): Payer: Self-pay

## 2021-02-17 NOTE — Progress Notes (Signed)
Received referral from PA regarding grant for medication assistance.  Called patient and left voicemail regarding medication with my contact name and number to return my call.

## 2021-02-23 ENCOUNTER — Encounter (HOSPITAL_COMMUNITY)
Admission: RE | Admit: 2021-02-23 | Discharge: 2021-02-23 | Disposition: A | Discharge status: Home / Self Care | Payer: Medicare Other | Source: Ambulatory Visit | Attending: Hematology and Oncology | Admitting: Hematology and Oncology

## 2021-02-23 ENCOUNTER — Ambulatory Visit (HOSPITAL_COMMUNITY)
Admission: RE | Admit: 2021-02-23 | Discharge: 2021-02-23 | Disposition: A | Discharge status: Home / Self Care | Payer: Medicare Other | Source: Ambulatory Visit | Attending: Physician Assistant | Admitting: Physician Assistant

## 2021-02-23 ENCOUNTER — Other Ambulatory Visit: Payer: Self-pay

## 2021-02-23 DIAGNOSIS — C61 Malignant neoplasm of prostate: Secondary | ICD-10-CM | POA: Insufficient documentation

## 2021-02-23 IMAGING — CT CT ABD-PELV W/ CM
2 of 6 series · 15 of 46 positions shown, 17 images · IV contrast (omnipaque)
Comparison: NM bone scan [DATE]

CLINICAL DATA: Prostate cancer staging

EXAM:
CT ABDOMEN AND PELVIS WITH CONTRAST
TECHNIQUE: Multidetector CT imaging of the abdomen and pelvis was performed
using the standard protocol following bolus administration of
intravenous contrast.
CONTRAST:  80mL OMNIPAQUE IOHEXOL 350 MG/ML SOLN

[Series 2: axial st · axial · 0.86mm/px · z∈[-620,-240]mm · 12 of 90 slices shown, 14 images]
[im 7/90  soft-tissue]
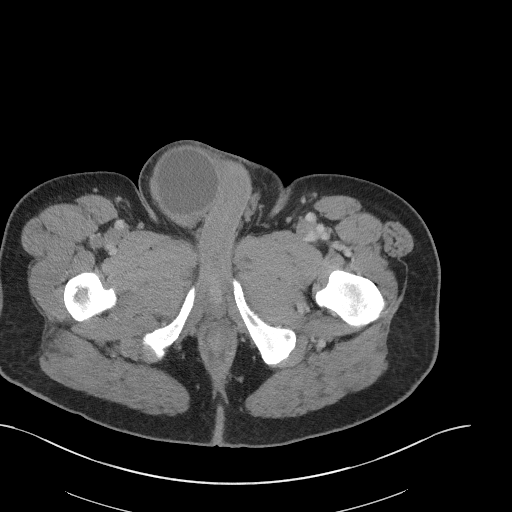
[im 7/90  bone]
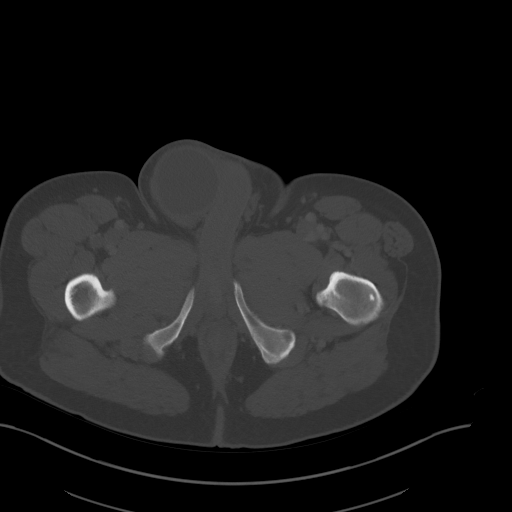
[im 14/90  soft-tissue]
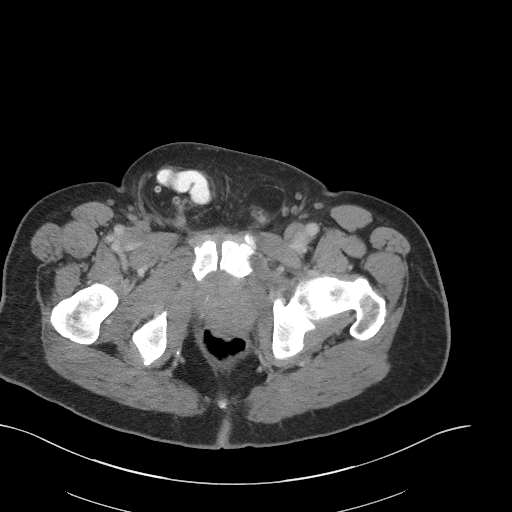
[im 21/90  soft-tissue]
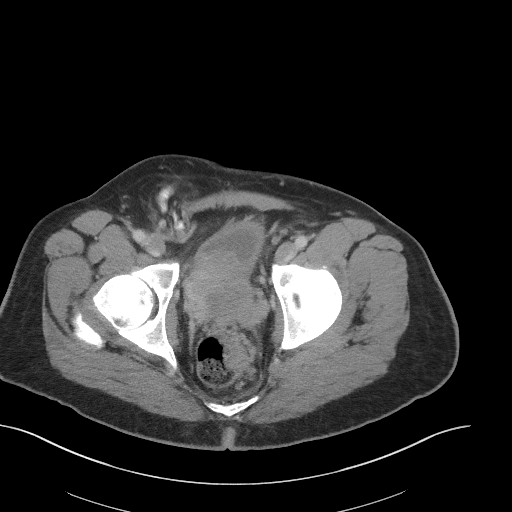
[im 28/90  soft-tissue]
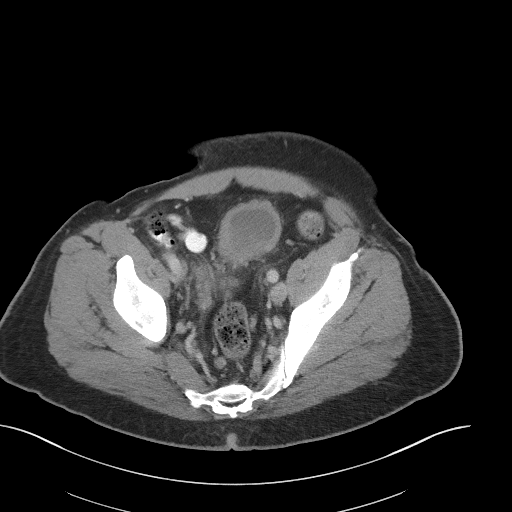
[im 35/90  soft-tissue]
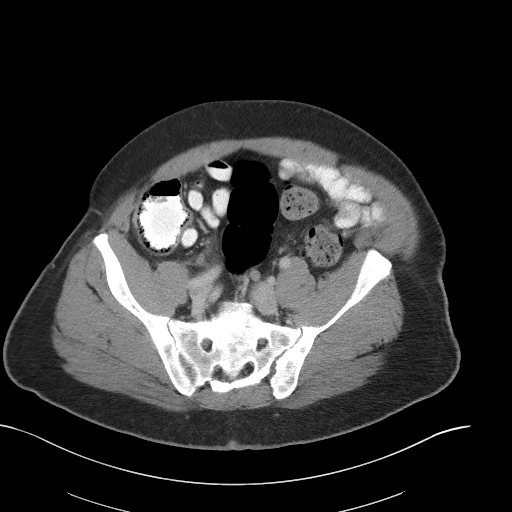
[im 42/90  soft-tissue]
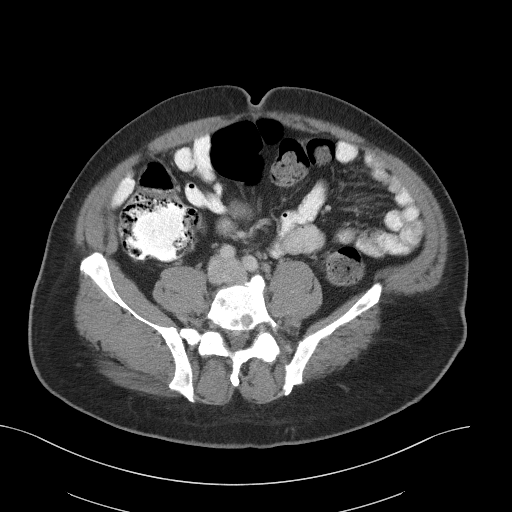
[im 48/90  soft-tissue]
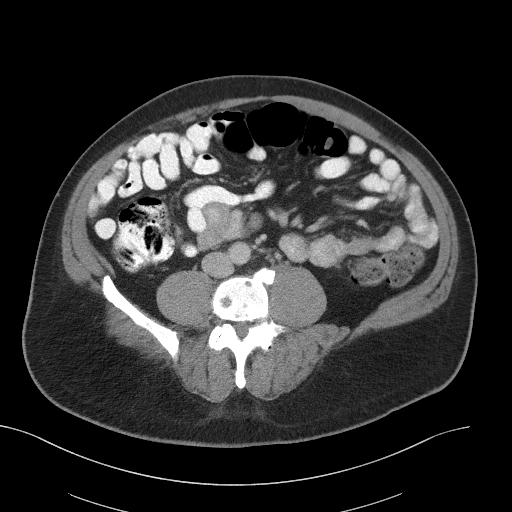
[im 55/90  soft-tissue]
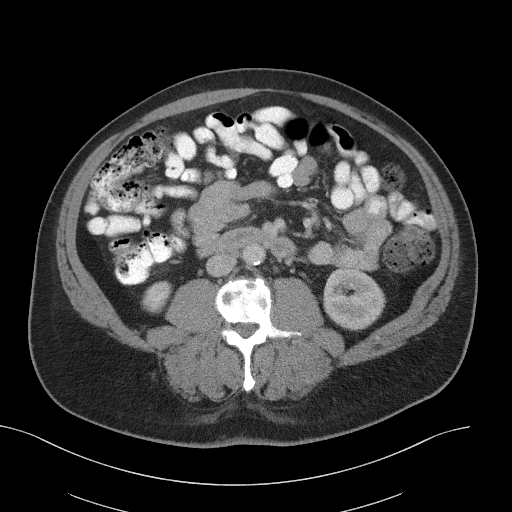
[im 62/90  soft-tissue]
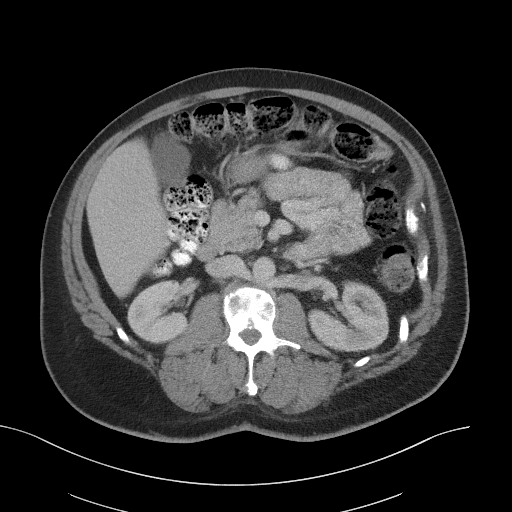
[im 62/90  bone]
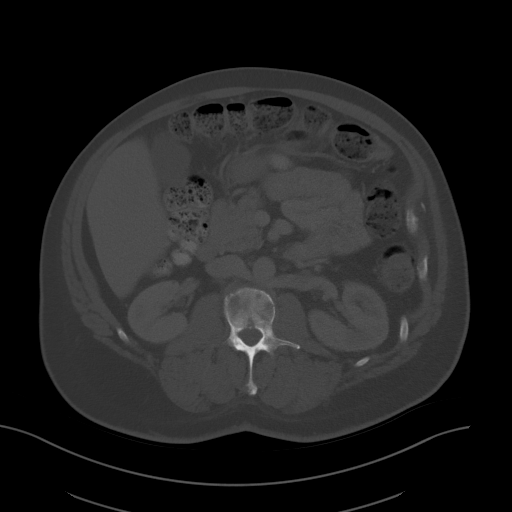
[im 69/90  soft-tissue]
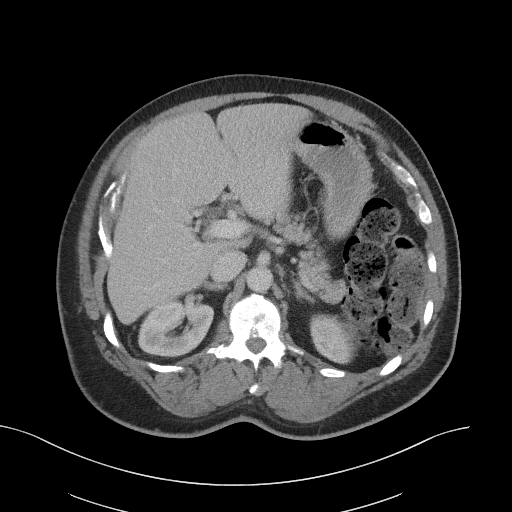
[im 76/90  soft-tissue]
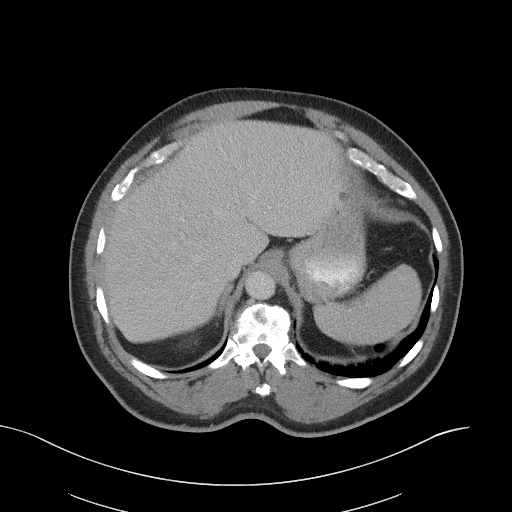
[im 83/90  soft-tissue]
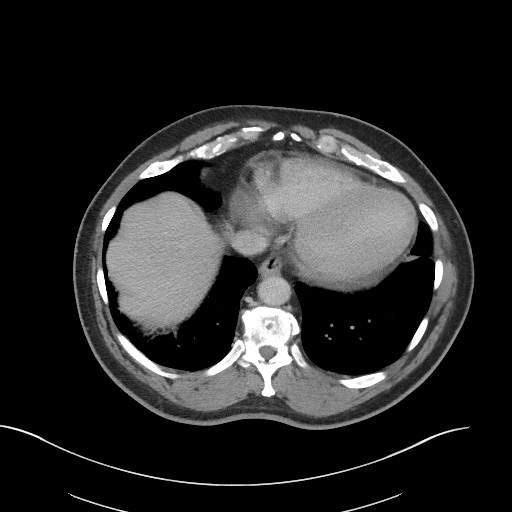

[Series 7: coronal st · coronal · 0.77mm/px · 3 of 110 slices shown]
[im 37/110  soft-tissue]
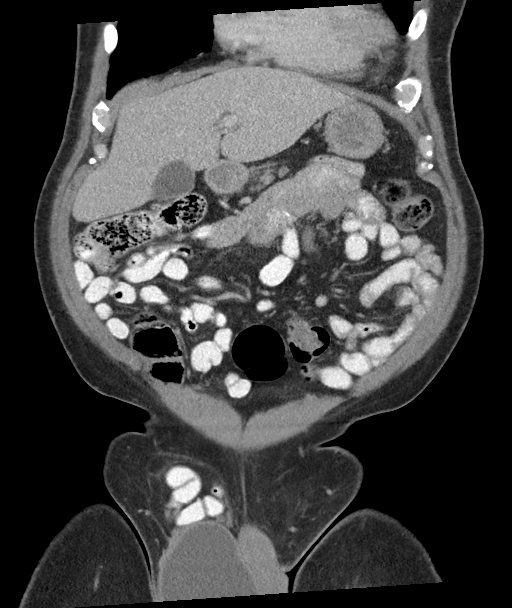
[im 49/110  soft-tissue]
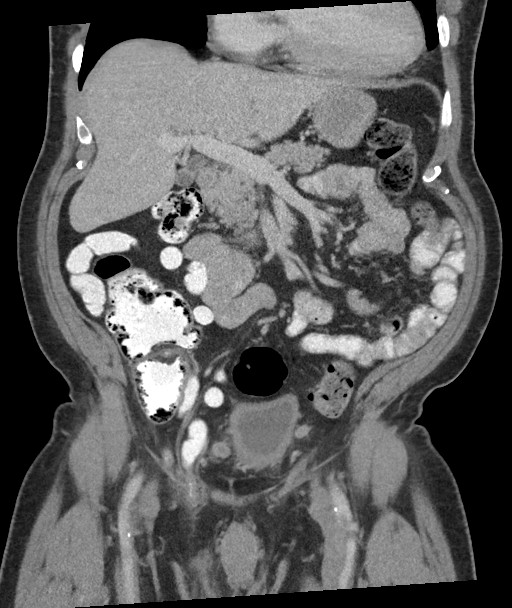
[im 61/110  soft-tissue]
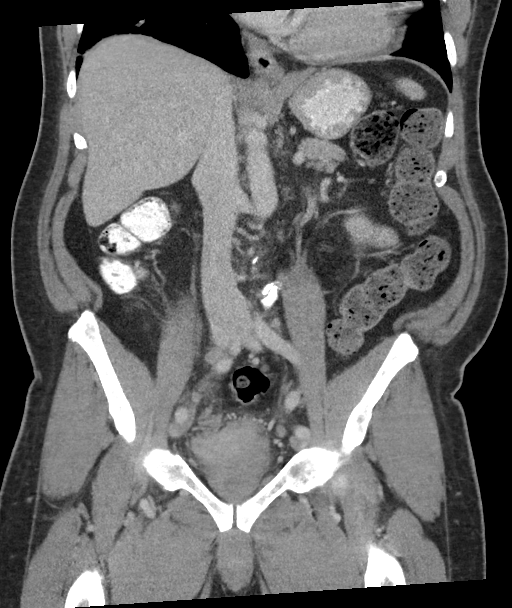

[15 of 46 positions shown; findings below may reference images not displayed]

FINDINGS: Lower chest:

No acute abnormality.

Hepatobiliary: No focal liver abnormality is seen. No gallstones,
gallbladder wall thickening, or biliary dilatation.

Pancreas: Unremarkable. No pancreatic ductal dilatation or
surrounding inflammatory changes.

Spleen: Normal in size without focal abnormality.

Adrenals/Urinary Tract: Normal adrenal glands. No nephrolithiasis,
hydronephrosis or mass. The enlarged, heterogeneous prostate gland
has mass effect upon the bladder base and posterior wall of bladder.
Tumor invasion of the bladder wall cannot be excluded, image 63/8.

Stomach/Bowel: Stomach is within normal limits. Appendix appears
normal. No evidence of bowel wall thickening, distention, or
inflammatory changes. Posterior extension of tumor from the prostate
gland is suspected with involvement of the anterior and left lateral
wall of the rectum, image 70/2.

Vascular/Lymphatic: Aortic atherosclerosis. Bilateral pelvic
adenopathy, including:

-Enlarged right common iliac node measures 1.3 cm, image 53/2.

-Right external iliac lymph lmph node measures 1.3 cm, image 60/2.

-Left external iliac lymph node measures 1.2 cm, image 60/2.

-Index perirectal lymph node within the presacral soft tissue space
measures 1.2 cm, image 66/2.

-Lymph node within right anterior pelvis adjacent to the right
anterior wall of bladder measures 1.1 cm, image 68/2. Similarly on
the left there is a lymph node measuring 1 cm, image 66/2.

Reproductive: Irregular and heterogeneous prostate gland is
concerning for diffuse tumor involvement. This measures 7.9 x 6.6 by
6.3 cm (volume = 170 cm^3). Local tumor extension to involve the
anterior wall of rectum, seminal vesicles, and posterior wall of
bladder suspected.

Other: Large right inguinal hernia is identified containing
nonobstructed loops of small bowel as well as the appendix. A large
right hydrocele is noted. Left inguinal hernia contains fat only. No
ascites identified.

Musculoskeletal: Multifocal lytic bone metastases are identified
throughout the lumbar spine and bony pelvis. Lesions are too
numerous to count. Indeterminate sclerotic lesion within the right
superior pubic ramus is noted measuring 1.4 cm, image 77/2.
IMPRESSION: 1. Enlarged, heterogeneous prostate gland is concerning for diffuse
tumor involvement. Tumor extension into the seminal vesicles and
urinary bladder is suspected. There is also signs of tumor extension
towards the left pelvic sidewall and anterior wall of the rectum.
2. Bilateral iliac and perirectal metastatic adenopathy.
3. Multifocal lytic bone metastases throughout the lumbar spine and
bony pelvis. Lesions are too numerous to count.
4. Large right inguinal hernia contains nonobstructed loops of small
bowel as well as the appendix. Large right hydrocele.
5. Aortic Atherosclerosis ([SB]-[SB]).

## 2021-02-23 IMAGING — NM NM BONE WHOLE BODY
2 series · 2 of 2 positions shown · non-contrast
Comparison: Chest CTA [DATE], CT abdomen and pelvis today.

CLINICAL DATA: Prostate cancer.

EXAM:
NUCLEAR MEDICINE WHOLE BODY BONE SCAN
TECHNIQUE: Whole body anterior and posterior images were obtained approximately
3 hours after intravenous injection of radiopharmaceutical.
RADIOPHARMACEUTICALS:  21.0 mCi [Q7] MDP IV

[Series 1: wbr_bone_40 whole body · 2.66mm/px · 1 of 1 slices shown (1 of 2)]
[im 1/1]
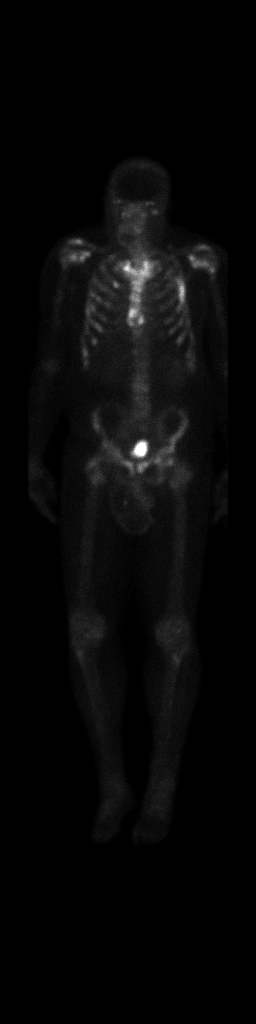

[Series 1: wbr_bone_40 whole body · 2.66mm/px · 1 of 1 slices shown (2 of 2)]
[im 1/1]
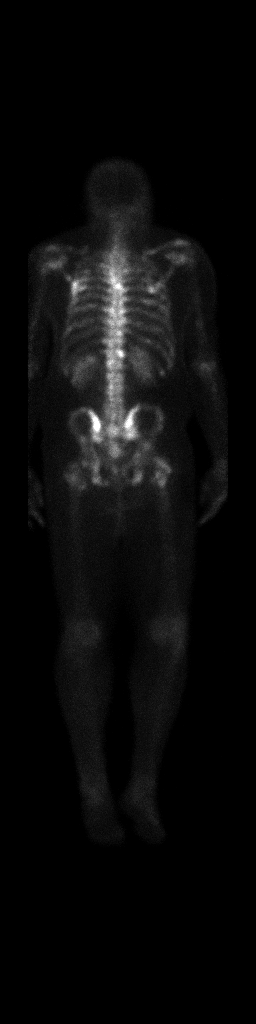

[2 of 2 positions shown; findings below may reference images not displayed]

FINDINGS: Lytic destructive lesion noted on chest CT of the lateral left fifth
rib with increased activity.

There are additional small scattered uptake abnormalities in the
anterior and posterior ribs and sternum at multiple sites consistent
with additional metastases.

There is additional activity in the anterior aspect of the scapulae
which may also be metastatic as well as in the upper and lower
thoracic spine on the posterior scans, in the anterior left iliac
crest on the anterior scans, bilaterally in the pubic bone and
posterior ischia and in both proximal femurs.

No other suspicious sites of skeletal activity are observed. Renal
and bladder activity are unremarkable.

Scrotal soft tissue activity surrounds a large hydrocele which was
noted on CT.
IMPRESSION: Multifocal osseous metastatic disease.

## 2021-02-23 MED ORDER — TECHNETIUM TC 99M MEDRONATE IV KIT
20.0000 | PACK | Freq: Once | INTRAVENOUS | Status: AC | PRN
  Administered 2021-02-23: 10:00:00 21 via INTRAVENOUS

## 2021-02-23 MED ORDER — IOHEXOL 350 MG/ML SOLN
80.0000 mL | Freq: Once | INTRAVENOUS | Status: AC | PRN
  Administered 2021-02-23: 11:00:00 80 mL via INTRAVENOUS

## 2021-02-27 ENCOUNTER — Encounter: Payer: Self-pay | Admitting: Physician Assistant

## 2021-02-27 ENCOUNTER — Telehealth: Payer: Self-pay

## 2021-02-27 ENCOUNTER — Other Ambulatory Visit: Payer: Self-pay | Admitting: Physician Assistant

## 2021-02-27 DIAGNOSIS — C61 Malignant neoplasm of prostate: Secondary | ICD-10-CM

## 2021-02-27 MED ORDER — XTAMPZA ER 9 MG PO C12A: 9.0000 mg | EXTENDED_RELEASE_CAPSULE | Freq: Two times a day (BID) | ORAL | 0 refills | Status: DC | PRN

## 2021-02-27 MED ORDER — OXYCODONE HCL 10 MG PO TABS: 10.0000 mg | ORAL_TABLET | Freq: Three times a day (TID) | ORAL | 0 refills | Status: DC | PRN

## 2021-02-27 NOTE — Telephone Encounter (Signed)
Spoke with pt and he has someone taking him to the pharmacy today.  His new rx for Oxycodone 9mg  ER will be $88 with ins and he cannot afford it.  He is currently taking two of the 10 mg and a friend gave him 600 mg of ibuprofen. He is still having positional back and chest pain.  He is waiting on a call from the social worker to see what she has to offer by way of financial assistance.  Pt was advised of the [facility] app that will deliver meds via fedex.  He said he would check into that when he goes to the pharmacy.

## 2021-02-27 NOTE — Progress Notes (Signed)
Received referral from provider to attempt to reach out to patient again.  Called patient to introduce myself as Arboriculturist and to offer available resources.  Discussed one-time $200 Prostate grant and one-time $1000 Alight grant to assist with medications, transportation and other personal expenses while going through treatment. Advised what is needed to apply. Patient was upset and frustrated from an experience from the weekend. After listening to him express his concerns, I apologized and asked how I may help him today to get what he need which is medication. Patient states he will have a friend lend him the money and take him to [facility] to get his medication.  Gave him my direct number and contact name to advise when he is able to provide needed documentation for the grant and moving forward be able to complete grant process and utilize. He verbalized understanding.  Shared information with provider.

## 2021-02-27 NOTE — Progress Notes (Signed)
I spoke to Randall Larsen today to follow up and review CT scan and Bone scan results. Results showed evidence of enlarged prostate gland concerning for diffuse tumor involvment and extension to nearby structure, bilateral iliac and perirectal metastatic adenopathy and multifocal lytic lesions.   Patient reports having severe pain and requires taking two oxycodone 10 mg tablets every 8 hours. Patient called in over the weekend requesting a refill and was instructed to go to the ED for evaluation. He did not go to the ED for evaluation.  Additionally, patient has not picked up his prescription for bicalutamide 50 mg daily. He plans to pick it up today.   I reviewed his pain regimen with Dr. Lorenso Courier who recommend long acting pain medication to his current regimen. I sent Xtampza 9 mg q 12 hours (preferred drug per insurance) and refill of oxycodone 10 mg to take for breaththrough pain .  Additionally, I will place a referral to palliative care. Patient is scheduled for a follow up with Dr. Lorenso Courier on 03/10/2021 to start Lupron injection.

## 2021-02-28 ENCOUNTER — Telehealth: Payer: Self-pay

## 2021-02-28 NOTE — Telephone Encounter (Signed)
Spoke with Randall Larsen and he went to pick up his prescriptions yesterday but did not pick up the Oxycodone ER 9 mg due to the cost.  [name] he may pick up at a later date.    He asked if the elevated PSA levels could be elevated due to the fluid in his rt testicle.  He was suppose to have the fluid drained years ago but his BP was too high and he never had the procedure.  He asked for a prescription for ED but we have not prescribed this medication and I advised him to discuss this at his next appointment which is on 03/09/21.    Pt also expressed concerns from his call to the office over the weekend when he spoke to the on call nurse.  He was in severe pain and did not feel he received proper care. Pt was advised that this will be addressed.

## 2021-03-03 ENCOUNTER — Ambulatory Visit: Payer: Medicare Other

## 2021-03-03 ENCOUNTER — Ambulatory Visit: Payer: Medicare Other | Admitting: Hematology and Oncology

## 2021-03-05 ENCOUNTER — Encounter (HOSPITAL_COMMUNITY): Payer: Self-pay

## 2021-03-05 ENCOUNTER — Other Ambulatory Visit: Payer: Self-pay

## 2021-03-05 ENCOUNTER — Emergency Department (HOSPITAL_COMMUNITY)
Admission: EM | Admit: 2021-03-05 | Discharge: 2021-03-05 | Disposition: A | Discharge status: Home / Self Care | Payer: Medicare Other | Attending: Emergency Medicine | Admitting: Emergency Medicine

## 2021-03-05 ENCOUNTER — Emergency Department (HOSPITAL_COMMUNITY): Payer: Medicare Other

## 2021-03-05 DIAGNOSIS — F17210 Nicotine dependence, cigarettes, uncomplicated: Secondary | ICD-10-CM | POA: Diagnosis not present

## 2021-03-05 DIAGNOSIS — R079 Chest pain, unspecified: Secondary | ICD-10-CM | POA: Insufficient documentation

## 2021-03-05 DIAGNOSIS — Z8546 Personal history of malignant neoplasm of prostate: Secondary | ICD-10-CM | POA: Diagnosis not present

## 2021-03-05 DIAGNOSIS — Z79899 Other long term (current) drug therapy: Secondary | ICD-10-CM | POA: Insufficient documentation

## 2021-03-05 DIAGNOSIS — I10 Essential (primary) hypertension: Secondary | ICD-10-CM | POA: Diagnosis not present

## 2021-03-05 IMAGING — CR DG CHEST 2V
2 series · 2 of 2 positions shown · non-contrast
Comparison: Chest radiograph dated [DATE].

CLINICAL DATA: Chest pain.

EXAM:
CHEST - 2 VIEW

[x chest ap]
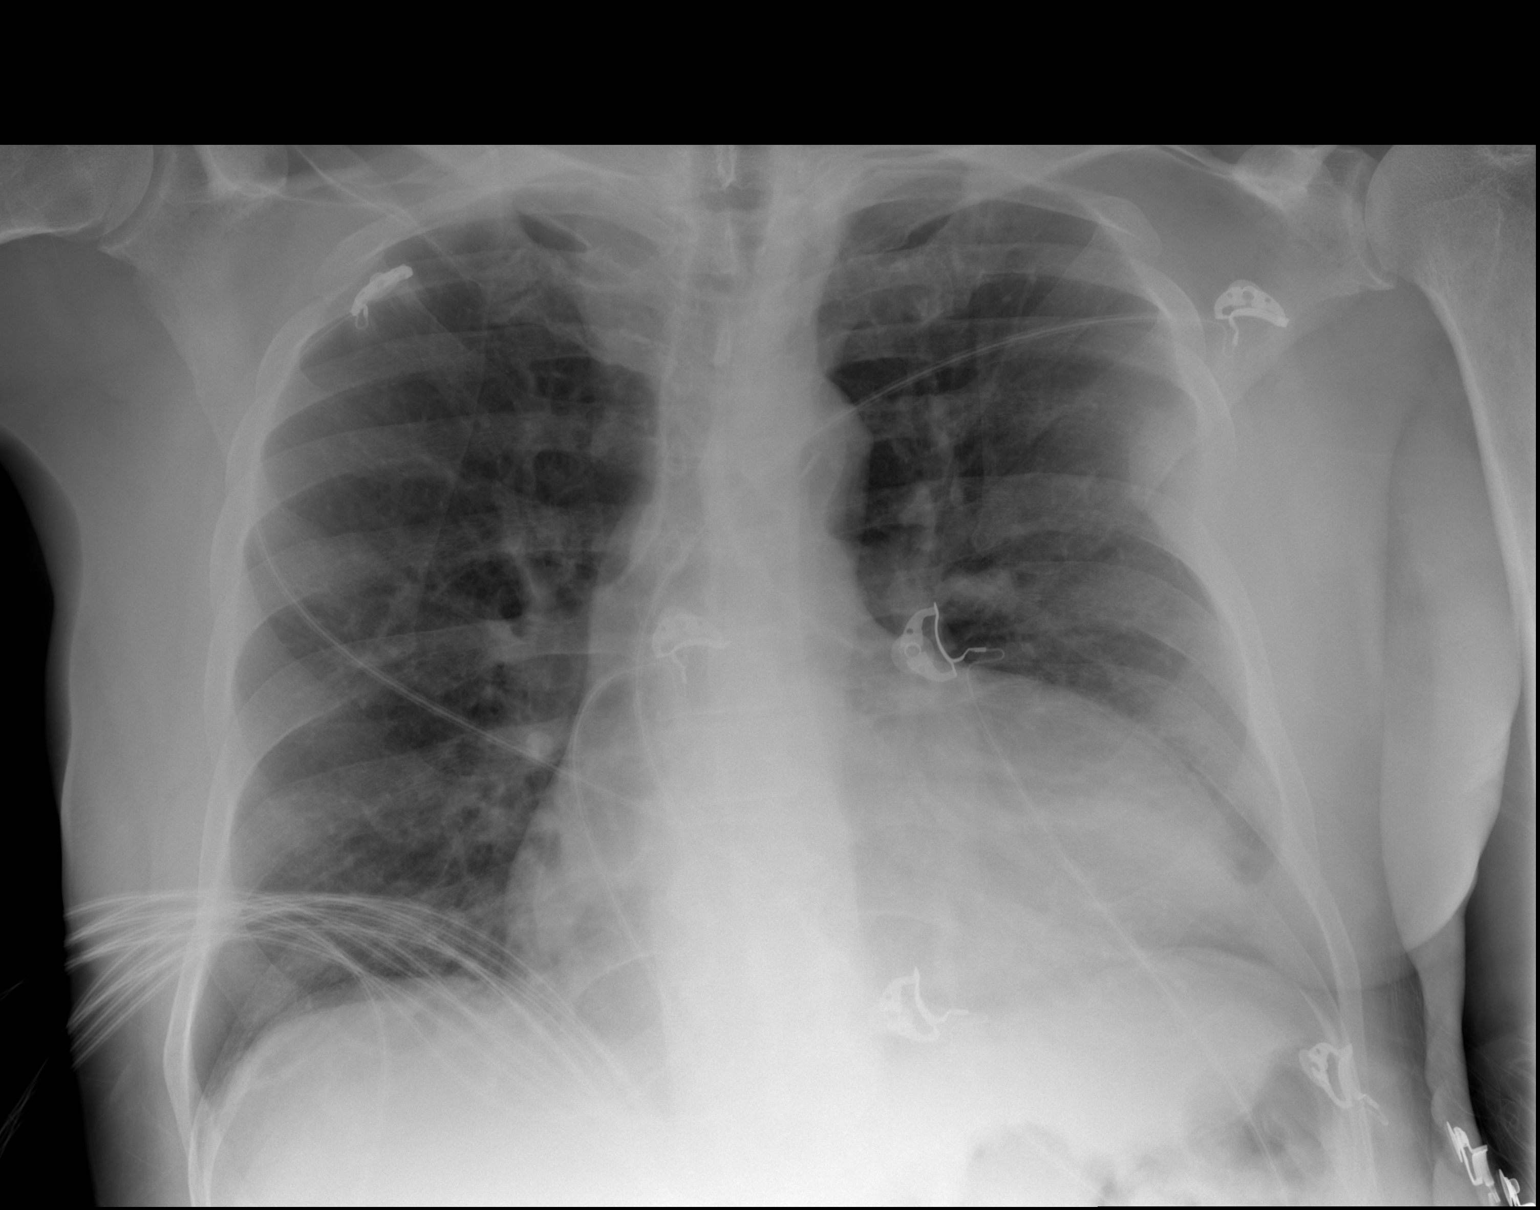

[w chest lat]
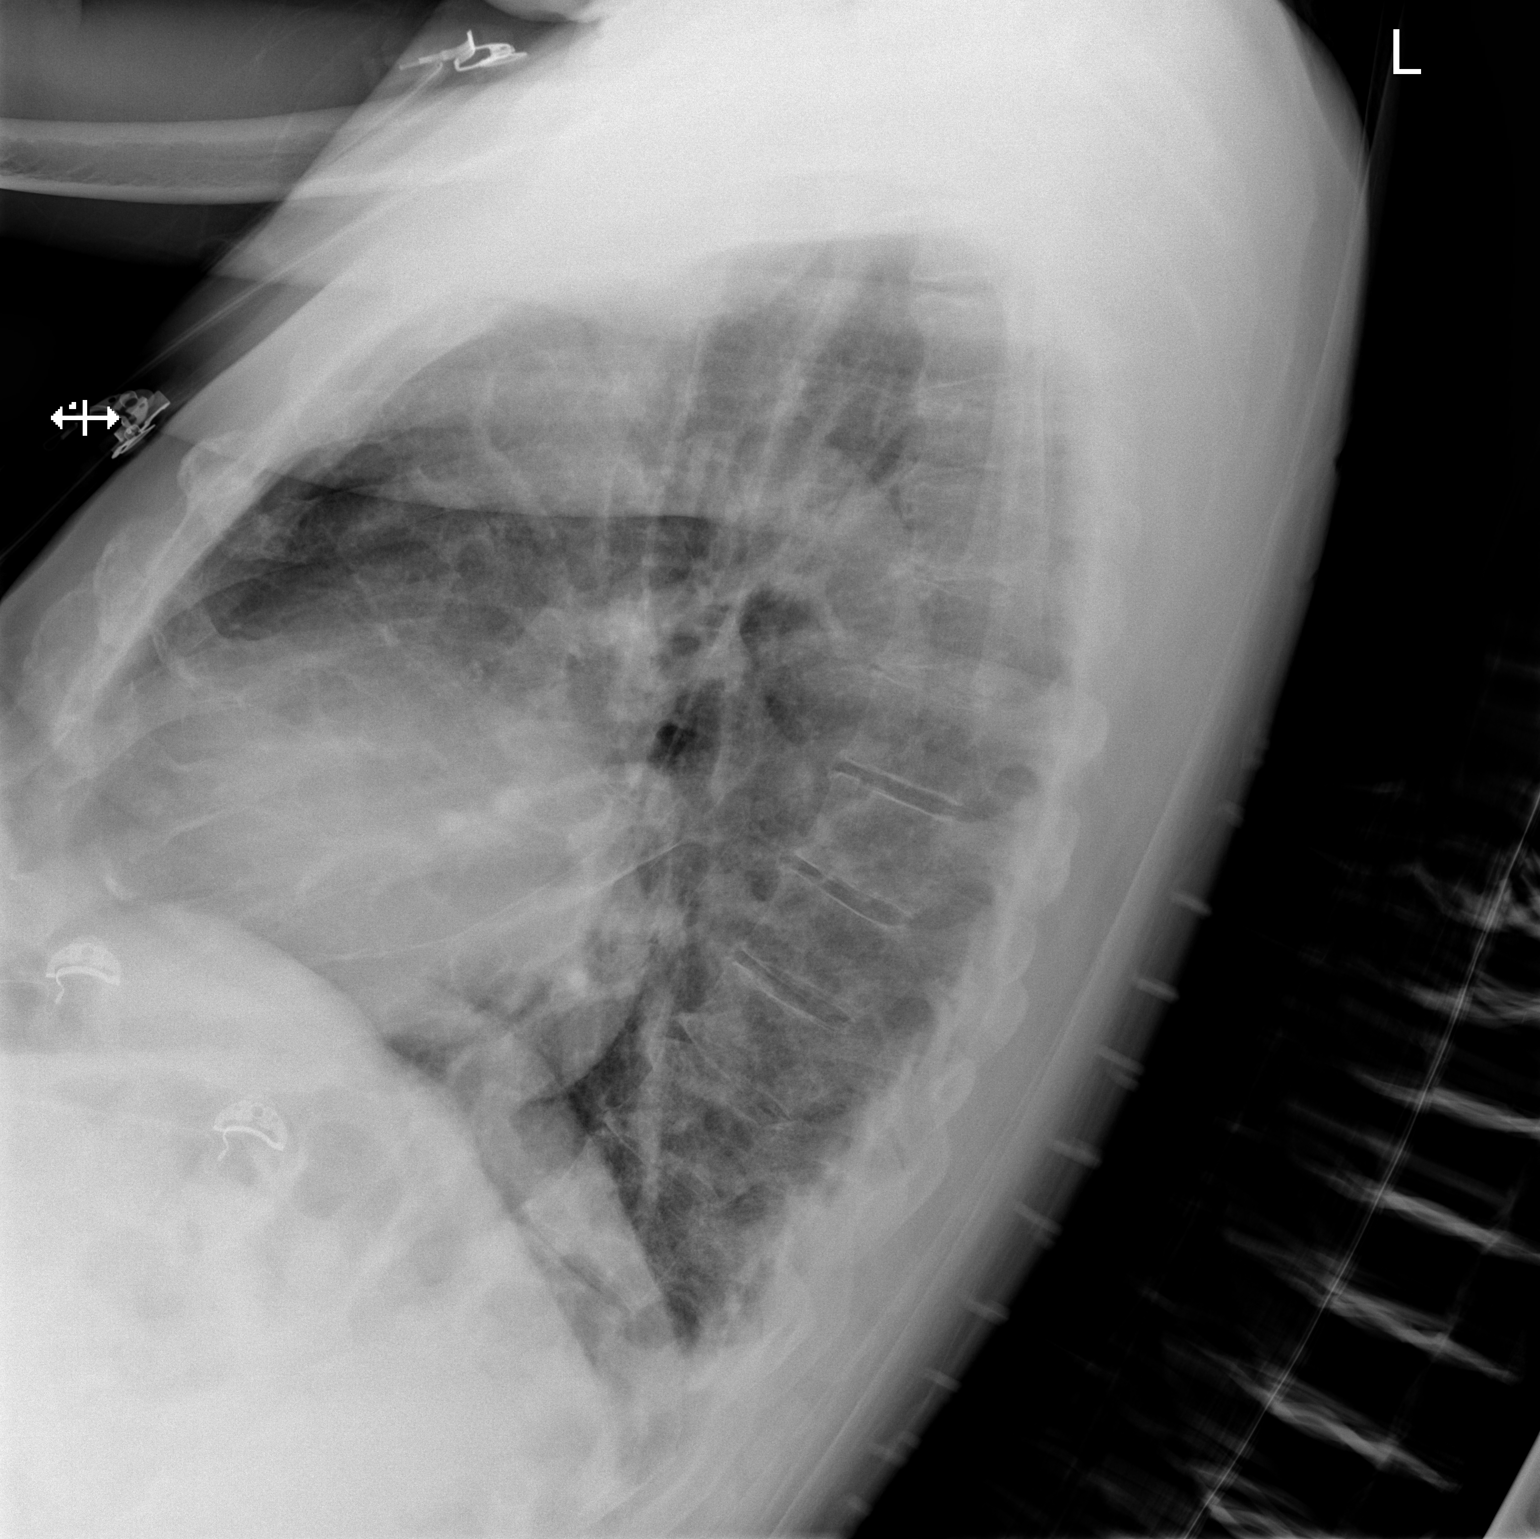

[2 of 2 positions shown; findings below may reference images not displayed]

FINDINGS: No focal consolidation, pleural effusion, or pneumothorax. Stable
cardiomegaly. Focal area of pleural based thickening in the lateral
left lung corresponding to the lytic lesion in the left lateral
fifth rib and associated pleural thickening, seen on the prior CT of
[DATE]. No acute osseous pathology.
IMPRESSION: 1. No acute cardiopulmonary process.
2. Stable cardiomegaly.

## 2021-03-05 MED ORDER — ACETAMINOPHEN 325 MG PO TABS
650.0000 mg | ORAL_TABLET | Freq: Once | ORAL | Status: AC
  Administered 2021-03-05: 22:00:00 650 mg via ORAL
  Filled 2021-03-05: qty 2

## 2021-03-05 NOTE — ED Triage Notes (Signed)
Patient coming from home with c/o chest pain mid sternal and to the left side and SOB.

## 2021-03-05 NOTE — Discharge Instructions (Signed)
The chest x-ray did not show any acute problems.  Continue taking her usual medicines for relief of the discomfort.  Follow-up with your doctor as planned, and earlier if needed.

## 2021-03-05 NOTE — ED Provider Notes (Signed)
[place] DEPT Provider Note   CSN: [id number] Arrival date & time: 03/05/21  1749     History No chief complaint on file.   Randall Larsen. is a 65 y.o. male.  HPI He presents for evaluation of chest pain, earlier today which started and stopped.  He has had pain similar to this previously because of metastatic prostate cancer.  He takes chronic pain medicine.  Currently his only complaint is being hungry.  He states he has not had anything to eat since early this morning because "I was not hungry."  He denies fever, chills, cough, shortness of breath, nausea, vomiting, diarrhea,  or diaphoresis.  He is currently being followed by oncology; he has been having difficulty affording his prescribed oxycodone medication.  He was recently started on oral medications with plans for additional medication to ensue.    Past Medical History:  Diagnosis Date   Hypertension    Prostate cancer [facility])     Patient Active Problem List   Diagnosis Date Noted   Prostate cancer ([place]) 02/14/2021   Bone lesion 02/14/2021    History reviewed. No pertinent surgical history.     No family history on file.  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 Authorizing Provider  amLODipine (NORVASC) 10 MG tablet Take 1 tablet (10 mg total) by mouth daily. 02/08/21   Carlisle Cater, PA-C  bicalutamide (CASODEX) 50 MG tablet Take 1 tablet (50 mg total) by mouth daily. 02/14/21   Orson Slick, MD  docusate sodium (COLACE) 100 MG capsule Take 1 capsule (100 mg total) by mouth every 12 (twelve) hours. Patient not  taking: Reported on 02/13/2021 02/05/21   Mesner, Corene Cornea, MD  hydrALAZINE (APRESOLINE) 50 MG tablet Take 1 tablet (50 mg total) by mouth 3 (three) times daily. 02/08/21   Carlisle Cater, PA-C  metoprolol succinate (TOPROL-XL) 100 MG 24 hr tablet Take 1 tablet (100 mg total) by mouth daily. 02/08/21   Carlisle Cater, PA-C  Multiple Vitamins-Minerals (ONE-A-DAY MENS 50+) TABS Take 1 tablet by mouth daily.    [provider]  naproxen (NAPROSYN) 500 MG tablet Take 500 mg by mouth 2 (two) times daily as needed for moderate pain. 01/04/21   [provider]  oxyCODONE ER (XTAMPZA ER) 9 MG C12A Take 9 mg by mouth every 12 (twelve) hours as needed. 02/27/21   Lincoln Brigham, PA-C  Oxycodone HCl 10 MG TABS Take 1 tablet (10 mg total) by mouth every 8 (eight) hours as needed. 02/27/21   Dede Query T, PA-C  sildenafil (REVATIO) 20 MG tablet Take 20 mg by mouth daily as needed (ED). Patient not taking: Reported on 02/13/2021 07/18/18   [provider]    Allergies    Patient has no known allergies.  Review of Systems   Review of Systems  All other systems reviewed and are negative.  Physical Exam Updated Vital Signs BP (!) 169/87 (BP Location: Right Arm)   Pulse 77   Temp 98.1 F (36.7  C) (Oral)   Resp (!) 21   Ht 5\' 11"  (1.803 m)   Wt 99.3 kg   SpO2 96%   BMI 30.54 kg/m   Physical Exam Vitals and nursing note reviewed.  Constitutional:      General: He is not in acute distress.    Appearance: He is well-developed. He is not ill-appearing, toxic-appearing or diaphoretic.  HENT:     Head: Normocephalic and atraumatic.     Right Ear: External ear normal.     Left Ear: External ear normal.  Eyes:     Conjunctiva/sclera: Conjunctivae normal.     Pupils: Pupils are equal, round, and reactive to light.  Neck:     Trachea: Phonation normal.  Cardiovascular:     Rate and Rhythm: Normal rate and regular rhythm.     Heart sounds: Normal heart sounds. No murmur  heard. Pulmonary:     Effort: Pulmonary effort is normal.     Breath sounds: Normal breath sounds.  Abdominal:     General: There is no distension.     Palpations: Abdomen is soft.     Tenderness: There is no abdominal tenderness.  Musculoskeletal:        General: Normal range of motion.     Cervical back: Normal range of motion and neck supple.  Skin:    General: Skin is warm and dry.     Coloration: Skin is not jaundiced or pale.  Neurological:     Mental Status: He is alert and oriented to person, place, and time.     Cranial Nerves: No cranial nerve deficit.     Sensory: No sensory deficit.     Motor: No abnormal muscle tone.     Coordination: Coordination normal.  Psychiatric:        Mood and Affect: Mood normal.        Behavior: Behavior normal.        Thought Content: Thought content normal.        Judgment: Judgment normal.    ED Results / Procedures / Treatments   Labs (all labs ordered are listed, but only abnormal results are displayed) Labs Reviewed - No data to display  EKG None  Radiology DG Chest 2 View  Result Date: 03/05/2021 CLINICAL DATA:  Chest pain. EXAM: CHEST - 2 VIEW COMPARISON:  Chest radiograph dated 02/08/2021. FINDINGS: No focal consolidation, pleural effusion, or pneumothorax. Stable cardiomegaly. Focal area of pleural based thickening in the lateral left lung corresponding to the lytic lesion in the left lateral fifth rib and associated pleural thickening, seen on the prior CT of 02/08/2021. No acute osseous pathology. IMPRESSION: 1. No acute cardiopulmonary process. 2. Stable cardiomegaly. Electronically Signed   By: Anner Crete M.D.   On: 03/05/2021 19:30    Procedures Procedures   Medications Ordered in ED Medications  acetaminophen (TYLENOL) tablet 650 mg (650 mg Oral Given 03/05/21 2148)    ED Course  I have reviewed the triage vital signs and the nursing notes.  Pertinent labs & imaging results that were available during my  care of the patient were reviewed by me and considered in my medical decision making (see chart for details).    MDM Rules/Calculators/A&P                            Patient Vitals for the past 24 hrs:  BP Temp Temp src Pulse Resp SpO2 Height Weight  03/05/21 2227 Marland Kitchen)  169/87 -- -- 77 (!) 21 96 % -- --  03/05/21 2155 (!) 166/85 -- -- 77 16 100 % -- --  03/05/21 2045 (!) 154/88 -- -- 90 15 99 % -- --  03/05/21 2011 (!) 172/89 -- -- 79 (!) 23 97 % -- --  03/05/21 2004 (!) 172/89 -- -- 80 20 100 % -- --  03/05/21 1830 (!) 147/77 -- -- 80 18 99 % -- --  03/05/21 1813 (!) 164/86 98.1 F (36.7 C) Oral 83 (!) 23 99 % -- --  03/05/21 1810 -- -- -- -- -- -- 5\' 11"  (1.803 m) 99.3 kg  03/05/21 1801 -- -- -- -- -- 98 % -- --    8:48 PM Reevaluation with update and discussion. After initial assessment and treatment, an updated evaluation reveals he is comfortable and able to eat.  Findings discussed and questions answered. Daleen Bo   Medical Decision Making:  This patient is presenting for evaluation of chest pain, which does require a range of treatment options, and is a complaint that involves a moderate risk of morbidity and mortality. The differential diagnoses include pneumothorax, metastatic cancer, pneumonia. I decided to review old records, and in summary elderly male, presenting with pain, which is recurrent, having recently been diagnosed with metastatic prostate cancer and started on new medication.  I did not require additional historical information from anyone.   Radiologic Tests Ordered, included chest x-ray.  I independently Visualized: Radiographic images, which show no acute abnormalities    Critical Interventions-evaluation, chest x-ray, observation, dietary supplementation, reassessment  After These Interventions, the Patient was reevaluated and was found stable for discharge.  Nonspecific complaints with reassuring clinical and radiographic evaluation.  No indication  for intervention in ED.  CRITICAL CARE-no Performed by: Daleen Bo  Nursing Notes Reviewed/ Care Coordinated Applicable Imaging Reviewed Interpretation of Laboratory Data incorporated into ED treatment  The patient appears reasonably screened and/or stabilized for discharge and I doubt any other medical condition or other [facility] requiring further screening, evaluation, or treatment in the ED at this time prior to discharge.  Plan: Home Medications-continue current; Home Treatments-gradual advance diet and activity; return here if the recommended treatment, does not improve the symptoms; Recommended follow up-PCP, as needed     Final Clinical Impression(s) / ED Diagnoses Final diagnoses:  Nonspecific chest pain    Rx / DC Orders ED Discharge Orders     None        Daleen Bo, MD 03/06/21 1114

## 2021-03-06 ENCOUNTER — Telehealth: Payer: Self-pay | Admitting: *Deleted

## 2021-03-06 NOTE — Telephone Encounter (Signed)
Received call from pt , requesting to cancel appts on this Thursday, 03/10/21 and have them rescheduled for 03/16/21  Scheduling sent request

## 2021-03-07 ENCOUNTER — Telehealth: Payer: Self-pay | Admitting: *Deleted

## 2021-03-07 ENCOUNTER — Telehealth: Payer: Self-pay | Admitting: Hematology and Oncology

## 2021-03-07 NOTE — Telephone Encounter (Signed)
Returned PC to patient, no answer, left VM - he called earlier requesting an appointment for next week any day except Monday or Friday, informed patient an appointment on Thursday 03/16/21 has been requested with scheduling.  He is also asking if his oxycodone dose can be increased - informed him IJaney Genta PA sent a prescription for Xtampza to his pharmacy on 02/27/21 and a refill for his oxycodone.  Instructed him to take the Xtampza every 12 hours and oxycodone for breakthrough pain.  Instructed patient to call office with any further questions/concerns, phone number given.

## 2021-03-07 NOTE — Telephone Encounter (Signed)
Called and left msg for patient to call back if still needing to r/s

## 2021-03-09 ENCOUNTER — Inpatient Hospital Stay: Payer: Medicare HMO | Admitting: Hematology and Oncology

## 2021-03-09 ENCOUNTER — Inpatient Hospital Stay: Payer: Medicare HMO

## 2021-03-09 ENCOUNTER — Inpatient Hospital Stay: Payer: Medicare HMO | Admitting: Nurse Practitioner

## 2021-03-09 ENCOUNTER — Telehealth: Payer: Self-pay | Admitting: *Deleted

## 2021-03-09 NOTE — Telephone Encounter (Signed)
Received call from pt. He has re-scheduled his appts here to 03/28/21. Pt asked about his pain medication. Advised that 2 prescriptions were sent in on 02/27/21 for oxycodone 10 mg and one for an extended release oxycodone, Xtampza.  Pt states he will call his pharmacy to see how much they cost and decide which one he can afford.

## 2021-03-16 ENCOUNTER — Encounter: Payer: Self-pay | Admitting: Hematology and Oncology

## 2021-03-16 ENCOUNTER — Telehealth: Payer: Self-pay | Admitting: *Deleted

## 2021-03-16 ENCOUNTER — Other Ambulatory Visit: Payer: Self-pay | Admitting: Hematology and Oncology

## 2021-03-16 MED ORDER — OXYCODONE HCL 10 MG PO TABS: 10.0000 mg | ORAL_TABLET | Freq: Three times a day (TID) | ORAL | 0 refills | Status: DC | PRN

## 2021-03-16 NOTE — Telephone Encounter (Signed)
Received call from pt requesting a refill of his Oxycodone. He takes 10 mg tabs -approx 3 tabs daily. Some days it helps his pain and some days it does not.  He cannot afford the long acting Xtampza so he is relying on the short acting oxycodone.Marland Kitchen He needs the refill today  as he only has 1 tablet left.

## 2021-03-16 NOTE — Progress Notes (Signed)
Patient called regarding pain medication. Advised I would send message to provider and Rn.  Message sent.  He has my card for any additional financial questions or concerns.

## 2021-03-17 ENCOUNTER — Encounter: Payer: Self-pay | Admitting: Hematology and Oncology

## 2021-03-17 NOTE — Progress Notes (Signed)
Patient left voicemail regarding documentation for [employer].  Called patient to advise what is needed and to schedule date and time if needed before next appointment. Patient states he will just wait til 12/20 next visit to bring documentation and complete grant process.  He has my card for any additional financial questions or concerns.

## 2021-03-22 ENCOUNTER — Telehealth: Payer: Self-pay

## 2021-03-22 NOTE — Telephone Encounter (Signed)
T/C from pt asking for an increase in the quantity of his oxycodone 5 mg.  Directions are 1 PO every 8 hrs  #30 Sometimes has to take 2 due to the severe pain but sometimes he only takes one. He had to pay out of pocket for his last refill.  He cannot afford the 9mg  it is $88.  He had to reschedule his 12/20 appt to 04/17/21 due to  legal issues needing a court appearance.

## 2021-03-23 ENCOUNTER — Telehealth: Payer: Self-pay | Admitting: *Deleted

## 2021-03-23 NOTE — Telephone Encounter (Signed)
Received vm message from pt requesting a refill of his oxycodone. He states he only has 6 left. He has been taking 2 tabs at a time.  He is asking to take 3 at a time.  He cannot afford the long acting [employer] is [facility] on [address].  Dr. Lorenso Courier made aware

## 2021-03-24 ENCOUNTER — Telehealth: Payer: Self-pay | Admitting: *Deleted

## 2021-03-24 ENCOUNTER — Other Ambulatory Visit: Payer: Self-pay | Admitting: Hematology and Oncology

## 2021-03-24 MED ORDER — OXYCODONE HCL 5 MG PO TABS: 5.0000 mg | ORAL_TABLET | ORAL | 0 refills | Status: DC | PRN

## 2021-03-24 NOTE — Telephone Encounter (Signed)
Pt called requesting refill for Oxycodone. Provider made aware. Refill was sent to pt preferred pharmacy at [facility] on [address] and [address]. Pt made aware.

## 2021-03-27 ENCOUNTER — Telehealth: Payer: Self-pay

## 2021-03-27 NOTE — Telephone Encounter (Signed)
Pt is currently taking the Xtampza 9 mg and the Oxycodone 5 mg. Oxycodone was 10 mg so he is taking two every 8 hrs.  His pain has gotten worse and he wants to know if he can add Ibuprofen 600 mg

## 2021-03-28 ENCOUNTER — Inpatient Hospital Stay: Payer: Medicare HMO

## 2021-03-28 ENCOUNTER — Inpatient Hospital Stay: Payer: Medicare HMO | Admitting: Hematology and Oncology

## 2021-03-28 ENCOUNTER — Inpatient Hospital Stay: Payer: Medicare HMO | Admitting: Nurse Practitioner

## 2021-03-28 NOTE — Telephone Encounter (Signed)
Pt advised with understanding. 

## 2021-03-29 ENCOUNTER — Other Ambulatory Visit: Payer: Self-pay | Admitting: *Deleted

## 2021-03-29 ENCOUNTER — Telehealth: Payer: Self-pay | Admitting: *Deleted

## 2021-03-29 MED ORDER — BICALUTAMIDE 50 MG PO TABS: 50.0000 mg | ORAL_TABLET | Freq: Every day | ORAL | 0 refills | Status: DC

## 2021-03-29 NOTE — Telephone Encounter (Signed)
Received message from on call service that pt was experiencing increased and and was on the floor and could not get up. TCT patient and spoke with him. He states he is better now and is up and about.  He is still having pain in his rib cage and his pelvis and legs.  He was able to get the Xtampza filled and is taking that every 12 hours.He takes his oxycodone 2 tablets every 8 hours.  Asked pt if he still has any Bicalutamide left. He states he took the last one yesterday. Advised that I will be sending in a refill for this as he needs to keep taking this while waiting to get his 1st Lupron injection.  He is currently scheduled here on 04/17/21.  Stressed the importance of coming to this appt. Pt voiced understanding.

## 2021-03-30 ENCOUNTER — Telehealth: Payer: Self-pay | Admitting: *Deleted

## 2021-03-30 NOTE — Telephone Encounter (Signed)
Received vm message from pt requesting refill of his Xtampza. He has 4 left. Last filled on 02/27/21 for # 14

## 2021-04-01 ENCOUNTER — Other Ambulatory Visit: Payer: Self-pay | Admitting: Hematology and Oncology

## 2021-04-01 MED ORDER — XTAMPZA ER 9 MG PO C12A: 9.0000 mg | EXTENDED_RELEASE_CAPSULE | Freq: Two times a day (BID) | ORAL | 0 refills | Status: DC

## 2021-04-02 ENCOUNTER — Emergency Department (HOSPITAL_COMMUNITY): Payer: Medicare Other

## 2021-04-02 ENCOUNTER — Emergency Department (HOSPITAL_COMMUNITY)
Admission: EM | Admit: 2021-04-02 | Discharge: 2021-04-03 | Disposition: A | Discharge status: Home / Self Care | Payer: Medicare Other | Attending: Emergency Medicine | Admitting: Emergency Medicine

## 2021-04-02 ENCOUNTER — Other Ambulatory Visit: Payer: Self-pay

## 2021-04-02 DIAGNOSIS — Z79899 Other long term (current) drug therapy: Secondary | ICD-10-CM | POA: Insufficient documentation

## 2021-04-02 DIAGNOSIS — Z8546 Personal history of malignant neoplasm of prostate: Secondary | ICD-10-CM | POA: Insufficient documentation

## 2021-04-02 DIAGNOSIS — K5909 Other constipation: Secondary | ICD-10-CM

## 2021-04-02 DIAGNOSIS — K5900 Constipation, unspecified: Secondary | ICD-10-CM | POA: Insufficient documentation

## 2021-04-02 DIAGNOSIS — I10 Essential (primary) hypertension: Secondary | ICD-10-CM | POA: Diagnosis not present

## 2021-04-02 DIAGNOSIS — F17210 Nicotine dependence, cigarettes, uncomplicated: Secondary | ICD-10-CM | POA: Diagnosis not present

## 2021-04-02 DIAGNOSIS — K4091 Unilateral inguinal hernia, without obstruction or gangrene, recurrent: Secondary | ICD-10-CM

## 2021-04-02 DIAGNOSIS — C7982 Secondary malignant neoplasm of genital organs: Secondary | ICD-10-CM

## 2021-04-02 LAB — CBC WITH DIFFERENTIAL/PLATELET
Abs Immature Granulocytes: 0.2 10*3/uL — ABNORMAL HIGH (ref 0.00–0.07)
Basophils Absolute: 0.1 10*3/uL (ref 0.0–0.1)
Basophils Relative: 1 %
Eosinophils Absolute: 0 10*3/uL (ref 0.0–0.5)
Eosinophils Relative: 0 %
HCT: 39.6 % (ref 39.0–52.0)
Hemoglobin: 13.6 g/dL (ref 13.0–17.0)
Immature Granulocytes: 2 %
Lymphocytes Relative: 28 %
Lymphs Abs: 2.9 10*3/uL (ref 0.7–4.0)
MCH: 29.8 pg (ref 26.0–34.0)
MCHC: 34.3 g/dL (ref 30.0–36.0)
MCV: 86.8 fL (ref 80.0–100.0)
Monocytes Absolute: 1 10*3/uL (ref 0.1–1.0)
Monocytes Relative: 10 %
Neutro Abs: 6.1 10*3/uL (ref 1.7–7.7)
Neutrophils Relative %: 59 %
Platelets: 182 10*3/uL (ref 150–400)
RBC: 4.56 MIL/uL (ref 4.22–5.81)
RDW: 14.6 % (ref 11.5–15.5)
WBC: 10.3 10*3/uL (ref 4.0–10.5)
nRBC: 0.6 % — ABNORMAL HIGH (ref 0.0–0.2)

## 2021-04-02 LAB — COMPREHENSIVE METABOLIC PANEL
ALT: 21 U/L (ref 0–44)
AST: 52 U/L — ABNORMAL HIGH (ref 15–41)
Albumin: 4.3 g/dL (ref 3.5–5.0)
Alkaline Phosphatase: 431 U/L — ABNORMAL HIGH (ref 38–126)
Anion gap: 12 (ref 5–15)
BUN: 21 mg/dL (ref 8–23)
CO2: 23 mmol/L (ref 22–32)
Calcium: 10.7 mg/dL — ABNORMAL HIGH (ref 8.9–10.3)
Chloride: 104 mmol/L (ref 98–111)
Creatinine, Ser: 1.52 mg/dL — ABNORMAL HIGH (ref 0.61–1.24)
GFR, Estimated: 50 mL/min — ABNORMAL LOW (ref >60)
Glucose, Bld: 100 mg/dL — ABNORMAL HIGH (ref 70–99)
Potassium: 3.4 mmol/L — ABNORMAL LOW (ref 3.5–5.1)
Sodium: 139 mmol/L (ref 135–145)
Total Bilirubin: 0.7 mg/dL (ref 0.3–1.2)
Total Protein: 7.5 g/dL (ref 6.5–8.1)

## 2021-04-02 LAB — LIPASE, BLOOD: Lipase: 20 U/L (ref 11–51)

## 2021-04-02 MED ORDER — IOHEXOL 350 MG/ML SOLN
100.0000 mL | Freq: Once | INTRAVENOUS | Status: AC | PRN
  Administered 2021-04-02: 21:00:00 80 mL via INTRAVENOUS

## 2021-04-02 MED ORDER — SODIUM CHLORIDE (PF) 0.9 % IJ SOLN
INTRAMUSCULAR | Status: AC
  Filled 2021-04-02: qty 50

## 2021-04-02 MED ORDER — ONDANSETRON HCL 4 MG/2ML IJ SOLN
4.0000 mg | Freq: Once | INTRAMUSCULAR | Status: AC
  Administered 2021-04-02: 18:00:00 4 mg via INTRAVENOUS
  Filled 2021-04-02: qty 2

## 2021-04-02 MED ORDER — FLEET ENEMA 7-19 GM/118ML RE ENEM
1.0000 | ENEMA | Freq: Once | RECTAL | Status: AC
  Administered 2021-04-02: 19:00:00 1 via RECTAL
  Filled 2021-04-02: qty 1

## 2021-04-02 MED ORDER — OXYCODONE HCL 5 MG PO TABS: 5.0000 mg | ORAL_TABLET | ORAL | Status: DC | PRN

## 2021-04-02 MED ORDER — HYDROMORPHONE HCL 1 MG/ML IJ SOLN
1.0000 mg | Freq: Once | INTRAMUSCULAR | Status: AC
  Administered 2021-04-02: 20:00:00 1 mg via INTRAVENOUS
  Filled 2021-04-02: qty 1

## 2021-04-02 MED ORDER — POLYETHYLENE GLYCOL 3350 17 G PO PACK: 17.0000 g | PACK | Freq: Two times a day (BID) | ORAL | 0 refills | Status: DC

## 2021-04-02 MED ORDER — DOCUSATE SODIUM 100 MG PO CAPS: 100.0000 mg | ORAL_CAPSULE | Freq: Two times a day (BID) | ORAL | 0 refills | Status: DC

## 2021-04-02 MED ORDER — LORAZEPAM 2 MG/ML IJ SOLN
1.0000 mg | Freq: Once | INTRAMUSCULAR | Status: AC
  Administered 2021-04-02: 20:00:00 1 mg via INTRAVENOUS
  Filled 2021-04-02: qty 1

## 2021-04-02 MED ORDER — SODIUM CHLORIDE 0.9 % IV BOLUS
1000.0000 mL | Freq: Once | INTRAVENOUS | Status: AC
  Administered 2021-04-02: 18:00:00 1000 mL via INTRAVENOUS

## 2021-04-02 MED ORDER — LIDOCAINE HCL URETHRAL/MUCOSAL 2 % EX GEL
1.0000 "application " | Freq: Once | CUTANEOUS | Status: AC
  Administered 2021-04-02: 1 via TOPICAL
  Filled 2021-04-02: qty 11

## 2021-04-02 MED ORDER — LACTULOSE 10 GM/15ML PO SOLN
30.0000 g | Freq: Once | ORAL | Status: AC
  Administered 2021-04-02: 18:00:00 30 g via ORAL
  Filled 2021-04-02: qty 60

## 2021-04-02 MED ORDER — HYDROMORPHONE HCL 1 MG/ML IJ SOLN
1.0000 mg | Freq: Once | INTRAMUSCULAR | Status: AC
  Administered 2021-04-02: 23:00:00 1 mg via INTRAVENOUS
  Filled 2021-04-02: qty 1

## 2021-04-02 MED ORDER — HYDROMORPHONE HCL 1 MG/ML IJ SOLN
1.0000 mg | Freq: Once | INTRAMUSCULAR | Status: AC
  Administered 2021-04-02: 18:00:00 1 mg via INTRAVENOUS
  Filled 2021-04-02: qty 1

## 2021-04-02 NOTE — ED Notes (Addendum)
Enema administered, scant amount bright red blood. Small hard stools passed.

## 2021-04-02 NOTE — ED Triage Notes (Signed)
Patient BIBA from home. C/O constipation, and abd px for the last week. Has been passing gas. Took Miralax this morning with no change.  Hx prostate cx. Takes oxy for pain

## 2021-04-02 NOTE — Discharge Instructions (Addendum)
Take MiraLAX twice daily.  You also need to take Colace twice daily as well.  Please stay hydrated  You still have a lot of stool in your bowel.  Consider follow-up with a GI doctor.   You also have a right inguinal hernia.  Please follow-up with surgeon  You have mets to your spine from your prostate cancer. Please continue your current pain meds and talk to your oncologist.    Return to ER if you have worse constipation, severe abdominal pain, vomiting, fever

## 2021-04-02 NOTE — ED Provider Notes (Addendum)
[facility] DEPT Provider Note   CSN: [id number] Arrival date & time: 04/02/21  1640     History Chief Complaint  Patient presents with   Abdominal Pain   Constipation    Alastor Kneale. is a 65 y.o. 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  Patient has chronic back pain.  Patient has been constipated for the last week or so.  He states that he felt the urge to have a bowel movement and took MiraLAX but unable to have a bowel movement today.  He states that he tried to strain on the toilet and has worsening pain in his back.  Denies any numbness to the legs or weakness to the legs.  The history is provided by the patient.      Past Medical History:  Diagnosis Date   Hypertension    Prostate cancer [facility])     Patient Active Problem List   Diagnosis Date Noted   Prostate cancer ([place]) 02/14/2021   Bone lesion 02/14/2021    No past surgical history on file.     No family history on file.  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 Authorizing Provider  amLODipine (NORVASC) 10 MG tablet Take 1 tablet (10 mg total) by mouth daily. 02/08/21  Yes Carlisle Cater, PA-C  bicalutamide (CASODEX) 50 MG tablet Take 1 tablet (50 mg total) by mouth daily. 03/29/21  Yes Orson Slick, MD  hydrALAZINE (APRESOLINE) 50 MG tablet Take 1 tablet (50 mg total) by mouth 3 (three) times daily. 02/08/21  Yes Carlisle Cater, PA-C  metoprolol succinate (TOPROL-XL) 100 MG 24 hr tablet Take 1 tablet (100 mg total) by mouth daily.  02/08/21  Yes Carlisle Cater, PA-C  Multiple Vitamins-Minerals (ONE-A-DAY MENS 50+) TABS Take 1 tablet by mouth daily.   Yes [provider]  oxyCODONE (OXY IR/ROXICODONE) 5 MG immediate release tablet Take 1 tablet (5 mg total) by mouth every 4 (four) hours as needed for severe pain. 03/24/21  Yes Orson Slick, MD  oxyCODONE ER [facility]) 9 MG C12A Take 9 mg by mouth every 12 (twelve) hours. 04/01/21  Yes Orson Slick, MD  docusate sodium (COLACE) 100 MG capsule Take 1 capsule (100 mg total) by mouth every 12 (twelve) hours. Patient not taking: Reported on 02/13/2021 02/05/21   Mesner, Corene Cornea, MD    Allergies    Patient has no known allergies.  Review of Systems   Review of Systems  Gastrointestinal:  Positive for abdominal pain and constipation.  All other systems reviewed and are negative.  Physical Exam Updated Vital Signs BP (!) 181/83    Pulse 92    Resp (!) 22    SpO2 90%   Physical Exam Vitals and nursing note reviewed.  Constitutional:      Comments: Chronically ill   HENT:     Head: Normocephalic.     Mouth/Throat:  Pharynx: Oropharynx is clear.  Eyes:     Extraocular Movements: Extraocular movements intact.     Pupils: Pupils are equal, round, and reactive to light.  Cardiovascular:     Rate and Rhythm: Normal rate and regular rhythm.     Heart sounds: Normal heart sounds.  Pulmonary:     Effort: Pulmonary effort is normal.     Breath sounds: Normal breath sounds.  Abdominal:     Comments: Distended   Genitourinary:    Comments: Rectal + stool impaction  Skin:    General: Skin is warm.     Capillary Refill: Capillary refill takes less than 2 seconds.  Neurological:     General: No focal deficit present.     Mental Status: He is oriented to person, place, and time.  Psychiatric:        Mood and Affect: Mood normal.        Behavior: Behavior normal.    ED Results / Procedures / Treatments   Labs (all labs ordered are listed, but only  abnormal results are displayed) Labs Reviewed  CBC WITH DIFFERENTIAL/PLATELET - Abnormal; Notable for the following components:      Result Value   nRBC 0.6 (*)    Abs Immature Granulocytes 0.20 (*)    All other components within normal limits  COMPREHENSIVE METABOLIC PANEL - Abnormal; Notable for the following components:   Potassium 3.4 (*)    Glucose, Bld 100 (*)    Creatinine, Ser 1.52 (*)    Calcium 10.7 (*)    AST 52 (*)    Alkaline Phosphatase 431 (*)    GFR, Estimated 50 (*)    All other components within normal limits  LIPASE, BLOOD    EKG EKG Interpretation  Date/Time:  Sunday April 02 2021 17:29:38 EST Ventricular Rate:  81 PR Interval:  187 QRS Duration: 107 QT Interval:  413 QTC Calculation: 480 R Axis:   -66 Text Interpretation: Sinus rhythm Left anterior fascicular block Anterior infarct, old Baseline wander in lead(s) V3 No significant change since last tracing Confirmed by Wandra Arthurs [phone]) on 04/02/2021 5:54:59 PM  Radiology CT ABDOMEN PELVIS W CONTRAST  Result Date: 04/02/2021 CLINICAL DATA:  Abdominal pain.  History of prostate cancer. EXAM: CT ABDOMEN AND PELVIS WITH CONTRAST TECHNIQUE: Multidetector CT imaging of the abdomen and pelvis was performed using the standard protocol following bolus administration of intravenous contrast. CONTRAST:  58mL OMNIPAQUE IOHEXOL 350 MG/ML SOLN COMPARISON:  CT abdomen pelvis dated 02/23/2021. FINDINGS: Lower chest: Bibasilar dependent atelectasis.  Mild cardiomegaly. No intra-abdominal free air or free fluid. Hepatobiliary: No focal liver abnormality is seen. No gallstones, gallbladder wall thickening, or biliary dilatation. Pancreas: Unremarkable. No pancreatic ductal dilatation or surrounding inflammatory changes. Spleen: Normal in size without focal abnormality. Adrenals/Urinary Tract: The adrenal glands are unremarkable. The kidneys, visualized ureters appear unremarkable. The urinary bladder is partially  distended. There is indentation of the base of the bladder by the enlarged and irregular prostate gland. Stomach/Bowel: There is moderate stool throughout the colon. There is herniation of several loops of small bowel into the right inguinal canal as well as herniation of the appendix. No evidence of obstruction or inflammation. Vascular/Lymphatic: Mild aortoiliac atherosclerotic disease. The IVC is unremarkable no portal venous gas. Several mildly enlarged pelvic lymph nodes similar to prior CT. A nodular density or lymph node in the anterior pelvis to the right of the urinary bladder measures 11 mm in short axis similar to prior CT. Reproductive: Enlarged  and irregular prostate gland measuring approximately 5.5 cm in transverse diameter. There is indentation of the base of the bladder. A 5 x 6 cm cystic structure superior to the right testicle may represent a hydrocele or a spermatocele. This is similar to prior CT. Other: None Musculoskeletal: Extensive osseous metastatic disease, progressed since the CT of 02/23/2021. Age indeterminate compression fracture of T9, new since the CT of 02/08/2021. Correlation with clinical exam and point tenderness recommended no other acute osseous pathology. IMPRESSION: 1. Right inguinal hernia containing several loops of small bowel as well as the appendix. No evidence of obstruction or inflammation. 2. Enlarged and irregular prostate gland in keeping with known prostate cancer. There is indentation of the base of the bladder. 3. Extensive osseous metastatic disease, progressed since the CT of 02/23/2021. 4. Age indeterminate compression fracture of T9, new since the CT of 02/08/2021. Correlation with clinical exam and point tenderness recommended. 5. Aortic Atherosclerosis (ICD10-I70.0). Electronically Signed   By: Anner Crete M.D.   On: 04/02/2021 21:57    Procedures Procedures   Medications Ordered in ED Medications  sodium chloride 0.9 % bolus 1,000 mL (0 mLs  Intravenous Stopped 04/02/21 1959)  HYDROmorphone (DILAUDID) injection 1 mg (1 mg Intravenous Given 04/02/21 1735)  ondansetron (ZOFRAN) injection 4 mg (4 mg Intravenous Given 04/02/21 1736)  lactulose (CHRONULAC) 10 GM/15ML solution 30 g (30 g Oral Given 04/02/21 1737)  sodium phosphate (FLEET) 7-19 GM/118ML enema 1 enema (1 enema Rectal Given 04/02/21 1846)  lidocaine (XYLOCAINE) 2 % jelly 1 application (1 application Topical Given by Other 04/02/21 2032)  HYDROmorphone (DILAUDID) injection 1 mg (1 mg Intravenous Given 04/02/21 1958)  LORazepam (ATIVAN) injection 1 mg (1 mg Intravenous Given 04/02/21 1958)  iohexol (OMNIPAQUE) 350 MG/ML injection 100 mL (80 mLs Intravenous Contrast Given 04/02/21 2112)  sodium chloride (PF) 0.9 % injection (  Given by Other 04/02/21 2140)    ED Course  I have reviewed the triage vital signs and the nursing notes.  Pertinent labs & imaging results that were available during my care of the patient were reviewed by me and considered in my medical decision making (see chart for details).    MDM Rules/Calculators/A&P                         Elbridge Magowan. is a 65 y.o. male here presenting with abdominal pain and constipation.  Patient has history of metastatic prostate cancer on chronic oxycodone. Patient has constipation and I think his pain medicine made his constipation worse.  I try to disimpact him but he did not tolerate the procedure well.  We will try and given some pain medicine and he would like to try oral lactulose first.   10:01 PM Labs showed creatinine of 1.5 which is baseline.  CT showed right inguinal hernia with no bowel obstruction. Patient has known inguinal hernia. He also has increased mets to his spine but pain is controlled with pain meds and he was prescribed oxycontin recently by oncology and has oxycodone at home. Patient had several large bowel movements after enema.  I did try to perform another digital rectal exam.  He has soft  stools now.  At this point, he is stable for discharge.  Recommend that he takes MiraLAX twice daily and take some Colace as well. He can take his percocet as prescribed by oncology       Final Clinical Impression(s) / ED Diagnoses Final diagnoses:  None    Rx / DC Orders ED Discharge Orders     None        Drenda Freeze, MD 04/02/21 2202    Drenda Freeze, MD 04/03/21 [phone]

## 2021-04-03 NOTE — ED Notes (Signed)
Pt left via [facility] 

## 2021-04-05 ENCOUNTER — Telehealth: Payer: Self-pay | Admitting: *Deleted

## 2021-04-05 NOTE — Telephone Encounter (Signed)
Received call from pt requesting refill of his oxycodone 10mg . Pt takes 1-2 tabs every 4-6 hours. [facility] filled on 03/24/21.  Needs refilled by 04/07/21 Last refill was for 5 mg tabs, but pt has been 10 mg tabs for quite awhile.  Pt uses [facility] on [address]

## 2021-04-06 ENCOUNTER — Other Ambulatory Visit: Payer: Self-pay | Admitting: Hematology and Oncology

## 2021-04-06 ENCOUNTER — Ambulatory Visit (HOSPITAL_COMMUNITY): Payer: Medicare Other

## 2021-04-06 ENCOUNTER — Telehealth: Payer: Self-pay | Admitting: *Deleted

## 2021-04-06 ENCOUNTER — Other Ambulatory Visit (HOSPITAL_COMMUNITY): Payer: Self-pay | Admitting: Physician Assistant

## 2021-04-06 ENCOUNTER — Other Ambulatory Visit: Payer: Self-pay | Admitting: *Deleted

## 2021-04-06 DIAGNOSIS — M7989 Other specified soft tissue disorders: Secondary | ICD-10-CM

## 2021-04-06 DIAGNOSIS — C61 Malignant neoplasm of prostate: Secondary | ICD-10-CM

## 2021-04-06 DIAGNOSIS — R600 Localized edema: Secondary | ICD-10-CM

## 2021-04-06 MED ORDER — OXYCODONE HCL 5 MG PO TABS
5.0000 mg | ORAL_TABLET | ORAL | 0 refills | Status: DC | PRN
Start: 2021-04-06 — End: 2021-04-08

## 2021-04-06 NOTE — Addendum Note (Signed)
Addended by: Aura Fey A on: 04/06/2021 03:23 PM   Modules accepted: Orders

## 2021-04-06 NOTE — Telephone Encounter (Signed)
Received call back from this patient. His PCP is unavailable and is requesting to be seen here tomorrow in [facility]. Will discuss with Eloise Harman., PA in [facility] and then schedule for labs and [facility] visit  on 04/07/21. Pt made aware.  [place] transportation to assist pt with his transportation,  Scheduling message sent for labs and [facility]. Lab orders placed.

## 2021-04-06 NOTE — Telephone Encounter (Signed)
Received call from pt. He states his right foot and ankle are swelling.  He is asking if this is happening because of his BP meds and/or his bicalutamide.  Advised that the bicalutamide could cause swelling   Advised that he could be seen here in the [facility] or he could reach out to his PCP as he said that this has happened in the past  and he thought it might be due to his BP meds.  Advised that he sgould call his PCP 1st and try and get in with her. Advised that if he cannot get in with her, we could see him in our [facility] tomorrow. He voiced understanding. He states he will call back if he cannot see his PCP.

## 2021-04-06 NOTE — Telephone Encounter (Signed)
See previous notes.

## 2021-04-07 ENCOUNTER — Inpatient Hospital Stay: Payer: Medicare Other

## 2021-04-07 ENCOUNTER — Other Ambulatory Visit: Payer: Self-pay

## 2021-04-07 ENCOUNTER — Encounter (HOSPITAL_COMMUNITY): Payer: Medicare Other

## 2021-04-07 ENCOUNTER — Ambulatory Visit (HOSPITAL_COMMUNITY): Payer: Medicare Other

## 2021-04-07 ENCOUNTER — Ambulatory Visit (HOSPITAL_BASED_OUTPATIENT_CLINIC_OR_DEPARTMENT_OTHER)
Admission: RE | Admit: 2021-04-07 | Discharge: 2021-04-07 | Disposition: A | Discharge status: Home / Self Care | Payer: Medicare Other | Source: Ambulatory Visit | Attending: Physician Assistant | Admitting: Physician Assistant

## 2021-04-07 ENCOUNTER — Inpatient Hospital Stay: Payer: Medicare Other | Attending: Physician Assistant | Admitting: Physician Assistant

## 2021-04-07 VITALS — BP 159/65 | HR 82 | Temp 97.9°F | Resp 17 | Wt 218.3 lb

## 2021-04-07 DIAGNOSIS — I10 Essential (primary) hypertension: Secondary | ICD-10-CM | POA: Diagnosis not present

## 2021-04-07 DIAGNOSIS — M7989 Other specified soft tissue disorders: Secondary | ICD-10-CM

## 2021-04-07 DIAGNOSIS — C61 Malignant neoplasm of prostate: Secondary | ICD-10-CM | POA: Diagnosis present

## 2021-04-07 DIAGNOSIS — M79605 Pain in left leg: Secondary | ICD-10-CM | POA: Diagnosis not present

## 2021-04-07 DIAGNOSIS — F17210 Nicotine dependence, cigarettes, uncomplicated: Secondary | ICD-10-CM | POA: Diagnosis not present

## 2021-04-07 DIAGNOSIS — C7951 Secondary malignant neoplasm of bone: Secondary | ICD-10-CM | POA: Insufficient documentation

## 2021-04-07 DIAGNOSIS — R600 Localized edema: Secondary | ICD-10-CM

## 2021-04-07 LAB — CBC WITH DIFFERENTIAL (CANCER CENTER ONLY)
Abs Immature Granulocytes: 0.16 10*3/uL — ABNORMAL HIGH (ref 0.00–0.07)
Basophils Absolute: 0.1 10*3/uL (ref 0.0–0.1)
Basophils Relative: 1 %
Eosinophils Absolute: 0.1 10*3/uL (ref 0.0–0.5)
Eosinophils Relative: 1 %
HCT: 35.8 % — ABNORMAL LOW (ref 39.0–52.0)
Hemoglobin: 12.1 g/dL — ABNORMAL LOW (ref 13.0–17.0)
Immature Granulocytes: 2 %
Lymphocytes Relative: 26 %
Lymphs Abs: 2.3 10*3/uL (ref 0.7–4.0)
MCH: 29.6 pg (ref 26.0–34.0)
MCHC: 33.8 g/dL (ref 30.0–36.0)
MCV: 87.5 fL (ref 80.0–100.0)
Monocytes Absolute: 1 10*3/uL (ref 0.1–1.0)
Monocytes Relative: 11 %
Neutro Abs: 5.4 10*3/uL (ref 1.7–7.7)
Neutrophils Relative %: 59 %
Platelet Count: 179 10*3/uL (ref 150–400)
RBC: 4.09 MIL/uL — ABNORMAL LOW (ref 4.22–5.81)
RDW: 14.5 % (ref 11.5–15.5)
WBC Count: 9 10*3/uL (ref 4.0–10.5)
nRBC: 0.3 % — ABNORMAL HIGH (ref 0.0–0.2)

## 2021-04-07 LAB — CMP (CANCER CENTER ONLY)
ALT: 19 U/L (ref 0–44)
AST: 21 U/L (ref 15–41)
Albumin: 4.1 g/dL (ref 3.5–5.0)
Alkaline Phosphatase: 283 U/L — ABNORMAL HIGH (ref 38–126)
Anion gap: 8 (ref 5–15)
BUN: 17 mg/dL (ref 8–23)
CO2: 28 mmol/L (ref 22–32)
Calcium: 10.1 mg/dL (ref 8.9–10.3)
Chloride: 103 mmol/L (ref 98–111)
Creatinine: 1.4 mg/dL — ABNORMAL HIGH (ref 0.61–1.24)
GFR, Estimated: 55 mL/min — ABNORMAL LOW (ref >60)
Glucose, Bld: 118 mg/dL — ABNORMAL HIGH (ref 70–99)
Potassium: 3.8 mmol/L (ref 3.5–5.1)
Sodium: 139 mmol/L (ref 135–145)
Total Bilirubin: 0.6 mg/dL (ref 0.3–1.2)
Total Protein: 6.7 g/dL (ref 6.5–8.1)

## 2021-04-07 NOTE — Progress Notes (Signed)
Symptom Management Consult note [facility]    Patient Care Team: [name], MD as PCP - General (Family Medicine)    Name of the patient: Randall Larsen  [id number]  Aug 23, 1955   Date of visit: 04/07/2021    Chief complaint/ Reason for visit- left foot and ankle swelling  Oncology History   No history exists.    Current Therapy: Bicalutamide 50 mg PO daily with plan to start Lupron injections 04/17/21  Interval history- Randall Larsen. Randall Larsen is a 65 yo male with history of prostate cancer with lytic lesions as well as hypertension presenting to [facility] today with chief complaint of left foot and ankle swelling. Patient states he was first diagnosed with prostate cancer in 2018 while living in [place] although did not start treatment. Patient states he recently became an established patient here at the [facility]. He reports he has had swelling in his left ankle and foot for several years, however noticed that is has worsened in the last week. He states he saw PCP for this in the past, does not remember when and it was thought to be a blood pressure medication causing it so that was stopped. He is currently taking amlodipine, hydralazine, and metoprolol which he has been on for "at least several months" per patient. He denies any associated pain in the left leg. He denies any known injury. He is able to ambulate and bear weight on the leg. He tries to elevate the leg and only notices minimal improvement when doing so. He denies eating a high salt diet. He denies fever, chills, shortness of breath, hemoptysis, chest pain, shortness of breath, numbness, tingling, weakness.     ROS  All other systems are reviewed and are negative for acute change except as noted in the HPI.    No Known Allergies   Past Medical History:  Diagnosis Date   Hypertension    Prostate cancer ([place])      No past surgical history on file.  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   Current Outpatient Medications:    amLODipine (NORVASC) 10 MG tablet, Take 1 tablet (10 mg total) by mouth daily., Disp: 30 tablet, Rfl: 2   bicalutamide (CASODEX) 50 MG tablet, Take 1 tablet (50 mg total) by mouth daily., Disp: 30 tablet, Rfl: 0  docusate sodium (COLACE) 100 MG capsule, Take 1 capsule (100 mg total) by mouth every 12 (twelve) hours., Disp: 60 capsule, Rfl: 0   hydrALAZINE (APRESOLINE) 50 MG tablet, Take 1 tablet (50 mg total) by mouth 3 (three) times daily., Disp: 90 tablet, Rfl: 2   metoprolol succinate (TOPROL-XL) 100 MG 24 hr tablet, Take 1 tablet (100 mg total) by mouth daily., Disp: 30 tablet, Rfl: 2   Multiple Vitamins-Minerals (ONE-A-DAY MENS 50+) TABS, Take 1 tablet by mouth daily., Disp: , Rfl:    oxyCODONE (OXY IR/ROXICODONE) 5 MG immediate release tablet, Take 1 tablet (5 mg total) by mouth every 4 (four) hours as needed for severe pain., Disp: 180 tablet, Rfl: 0   oxyCODONE ER (XTAMPZA ER) 9 MG C12A, Take 9 mg by mouth every 12 (twelve) hours., Disp: 60 capsule, Rfl:  0   polyethylene glycol (MIRALAX / GLYCOLAX) 17 g packet, Take 17 g by mouth 2 (two) times daily., Disp: 14 each, Rfl: 0  PHYSICAL EXAM: ECOG FS:1 - Symptomatic but completely ambulatory    Vitals:   04/07/21 1111  BP: (!) 159/65  Pulse: 82  Resp: 17  Temp: 97.9 F (36.6 C)  TempSrc: Oral  SpO2: 97%  Weight: 218 lb 4.8 oz (99 kg)   Physical Exam Vitals and nursing note reviewed.  Constitutional:      Appearance: He is well-developed. He is not ill-appearing or toxic-appearing.  HENT:     Head: Normocephalic and atraumatic.     Nose: Nose normal.  Eyes:     General: No scleral icterus.       Right eye: No discharge.        Left eye: No discharge.     Conjunctiva/sclera: Conjunctivae normal.  Neck:     Vascular: No JVD.  Cardiovascular:     Rate and Rhythm: Normal rate and regular rhythm.     Pulses:          Dorsalis pedis pulses are 2+ on the right side and 2+ on the left side.     Heart sounds: Normal heart sounds.  Pulmonary:     Effort: Pulmonary effort is normal.     Breath sounds: Normal breath sounds.  Abdominal:     General: There is no distension.  Musculoskeletal:        General: Normal range of motion.     Cervical back: Normal range of motion.     Comments: Swelling noted to bilateral lower extremities. Negative homans sign bilaterally. Mild in RLE compared to LLE. No piting edema. Compartments are soft in bilateral lower extremities. No palpable cords. He is able to wiggle all toes without difficulty. There is no overlying skin changes, no open wounds. He has full ROM of left ankle, joint intact. Ambulatory with steady gait.   Feet:     Right foot:     Toenail Condition: Right toenails are abnormally thick.     Left foot:     Toenail Condition: Left toenails are abnormally thick.  Skin:    General: Skin is warm and dry.     Capillary Refill: Capillary refill takes less than 2 seconds.     Comments: Equal tactile temperature in all extremities.   Neurological:     Mental Status: He is oriented to person, place, and time.     GCS: GCS eye subscore is 4. GCS verbal subscore is 5. GCS motor subscore is 6.     Comments: Fluent speech, no facial droop.  Psychiatric:  Behavior: Behavior normal.       LABORATORY DATA: I have reviewed the data as listed CBC Latest Ref Rng & Units 04/07/2021 04/02/2021 02/13/2021  WBC 4.0 - 10.5 K/uL 9.0 10.3 10.5  Hemoglobin 13.0 - 17.0 g/dL 12.1(L) 13.6 16.1  Hematocrit 39.0 - 52.0 % 35.8(L) 39.6 47.1  Platelets 150 - 400 K/uL 179 182 226     CMP Latest Ref Rng & Units 04/07/2021 04/02/2021 02/13/2021  Glucose 70 - 99 mg/dL 118(H) 100(H) 111(H)  BUN 8 - 23 mg/dL 17 21 16   Creatinine 0.61 - 1.24 mg/dL 1.40(H) 1.52(H) 1.19  Sodium 135 - 145 mmol/L 139 139 142  Potassium 3.5 - 5.1 mmol/L 3.8 3.4(L) 3.7  Chloride 98 - 111 mmol/L 103 104 106  CO2 22 - 32 mmol/L 28 23 28   Calcium 8.9 - 10.3 mg/dL 10.1 10.7(H) 9.9  Total Protein 6.5 - 8.1 g/dL 6.7 7.5 7.1  Total Bilirubin 0.3 - 1.2 mg/dL 0.6 0.7 0.6  Alkaline Phos 38 - 126 U/L 283(H) 431(H) 121  AST 15 - 41 U/L 21 52(H) 17  ALT 0 - 44 U/L 19 21 17        RADIOGRAPHIC STUDIES: I have personally reviewed the radiological images as listed and agreed with the findings in the report. No images are attached to the encounter. CT ABDOMEN PELVIS W CONTRAST  Result Date: 04/02/2021 CLINICAL DATA:  Abdominal pain.  History of prostate cancer. EXAM: CT ABDOMEN AND PELVIS WITH CONTRAST TECHNIQUE: Multidetector CT imaging of the abdomen and pelvis was performed using the standard protocol following bolus administration of intravenous contrast. CONTRAST:  19mL OMNIPAQUE IOHEXOL 350 MG/ML SOLN COMPARISON:  CT abdomen pelvis dated 02/23/2021. FINDINGS: Lower chest: Bibasilar dependent atelectasis.  Mild cardiomegaly. No intra-abdominal free air or free fluid. Hepatobiliary: No focal liver abnormality is seen. No gallstones, gallbladder wall thickening, or  biliary dilatation. Pancreas: Unremarkable. No pancreatic ductal dilatation or surrounding inflammatory changes. Spleen: Normal in size without focal abnormality. Adrenals/Urinary Tract: The adrenal glands are unremarkable. The kidneys, visualized ureters appear unremarkable. The urinary bladder is partially distended. There is indentation of the base of the bladder by the enlarged and irregular prostate gland. Stomach/Bowel: There is moderate stool throughout the colon. There is herniation of several loops of small bowel into the right inguinal canal as well as herniation of the appendix. No evidence of obstruction or inflammation. Vascular/Lymphatic: Mild aortoiliac atherosclerotic disease. The IVC is unremarkable no portal venous gas. Several mildly enlarged pelvic lymph nodes similar to prior CT. A nodular density or lymph node in the anterior pelvis to the right of the urinary bladder measures 11 mm in short axis similar to prior CT. Reproductive: Enlarged and irregular prostate gland measuring approximately 5.5 cm in transverse diameter. There is indentation of the base of the bladder. A 5 x 6 cm cystic structure superior to the right testicle may represent a hydrocele or a spermatocele. This is similar to prior CT. Other: None Musculoskeletal: Extensive osseous metastatic disease, progressed since the CT of 02/23/2021. Age indeterminate compression fracture of T9, new since the CT of 02/08/2021. Correlation with clinical exam and point tenderness recommended no other acute osseous pathology. IMPRESSION: 1. Right inguinal hernia containing several loops of small bowel as well as the appendix. No evidence of obstruction or inflammation. 2. Enlarged and irregular prostate gland in keeping with known prostate cancer. There is indentation of the base of the bladder. 3. Extensive osseous metastatic disease, progressed since the CT of 02/23/2021. 4. Age  indeterminate compression fracture of T9, new since the CT of  02/08/2021. Correlation with clinical exam and point tenderness recommended. 5. Aortic Atherosclerosis (ICD10-I70.0). Electronically Signed   By: Anner Crete M.D.   On: 04/02/2021 21:57     ASSESSMENT & PLAN: Patient is a 65 y.o. male with history of prostate cancer currently taking Bicalutamide followed by oncologist Dr. Lorenso Courier.  #)Left leg swelling- Patient non toxic appearing. VSS. He has swelling noted to bilateral lower extremities, very mild on RLE compared to LLE. He has soft compartments and no palpable cords with negative Homans sign. Labs were collected today and I viewed results. CBC overall unremarkable. CMP shows no significant electrolyte derangement, Creatinine is improved compared to labs from x 5 days ago (1.52-->1.4). Discussed with patient there are multiple possible etiologies of swelling which include side effect of bicalutamide, amlodipine, DVT, less likely to be gout as there is no associated pain. DVT study ordered, will call patient with results. If study is negative recommend patient discuss the oncologist at next visit the bicalutamide and his hypertension medications with pcp to see if there should be any changes made. Patient agreeable with plan of care.   Visit Diagnosis: 1. Left leg swelling      No orders of the defined types were placed in this encounter.   All questions were answered. The patient knows to call the clinic with any problems, questions or concerns. No barriers to learning was detected.  I have spent a total of 25 minutes minutes of face-to-face and non-face-to-face time, preparing to see the patient, obtaining and/or reviewing separately obtained history, performing a medically appropriate examination, counseling and educating the patient, ordering tests,  documenting clinical information in the electronic health record, and care coordination.     Thank you for allowing me to participate in the care of this patient.    Barrie Folk, PA-C Department of Hematology/Oncology [facility] Phone: [phone]  [phone]    04/07/2021 12:28 PM

## 2021-04-07 NOTE — Patient Instructions (Addendum)
I will call you with the ultrasound results.   If the ultrasound is negative the swelling could be caused by your blood pressure medications or the medicine you are taking for your cancer (bicalutamide).  You can discuss this with Dr. Lorenso Courier at your next visit on 04/17/21 if the swelling is still there. Try to elevate your legs as much as possible.   If symptoms worsen go to the emergency department for evaluation.

## 2021-04-07 NOTE — Progress Notes (Signed)
Left lower extremity venous duplex has been completed. Preliminary results can be found in CV Proc through chart review.  Results were given to [facility] [place] office.  04/07/21 12:42 PM Carlos Levering RVT

## 2021-04-08 ENCOUNTER — Other Ambulatory Visit: Payer: Self-pay | Admitting: Hematology and Oncology

## 2021-04-08 MED ORDER — OXYCODONE HCL 5 MG PO TABS: 5.0000 mg | ORAL_TABLET | ORAL | 0 refills | Status: DC | PRN

## 2021-04-08 MED ORDER — XTAMPZA ER 9 MG PO C12A: 9.0000 mg | EXTENDED_RELEASE_CAPSULE | Freq: Two times a day (BID) | ORAL | 0 refills | Status: DC

## 2021-04-08 NOTE — Progress Notes (Signed)
Oxycodone ER and oxycodone immediate release prescriptions were sent to her [facility] which did not have a pharmacist and therefore I had to resend the prescriptions to a different [facility] on [address].

## 2021-04-10 ENCOUNTER — Encounter: Payer: Self-pay | Admitting: Physician Assistant

## 2021-04-12 ENCOUNTER — Encounter: Payer: Self-pay | Admitting: Physician Assistant

## 2021-04-16 ENCOUNTER — Other Ambulatory Visit: Payer: Self-pay | Admitting: Hematology and Oncology

## 2021-04-16 DIAGNOSIS — C61 Malignant neoplasm of prostate: Secondary | ICD-10-CM

## 2021-04-17 ENCOUNTER — Other Ambulatory Visit: Payer: Self-pay

## 2021-04-17 ENCOUNTER — Inpatient Hospital Stay: Payer: Medicare HMO

## 2021-04-17 ENCOUNTER — Inpatient Hospital Stay: Payer: Medicare HMO | Attending: Physician Assistant | Admitting: Hematology and Oncology

## 2021-04-17 ENCOUNTER — Encounter: Payer: Self-pay | Admitting: Hematology and Oncology

## 2021-04-17 ENCOUNTER — Inpatient Hospital Stay (HOSPITAL_BASED_OUTPATIENT_CLINIC_OR_DEPARTMENT_OTHER): Payer: Medicare HMO | Admitting: Nurse Practitioner

## 2021-04-17 VITALS — BP 167/66 | HR 79 | Temp 97.7°F | Resp 16 | Wt 219.6 lb

## 2021-04-17 DIAGNOSIS — C7951 Secondary malignant neoplasm of bone: Secondary | ICD-10-CM | POA: Insufficient documentation

## 2021-04-17 DIAGNOSIS — G893 Neoplasm related pain (acute) (chronic): Secondary | ICD-10-CM

## 2021-04-17 DIAGNOSIS — C61 Malignant neoplasm of prostate: Secondary | ICD-10-CM

## 2021-04-17 DIAGNOSIS — K5900 Constipation, unspecified: Secondary | ICD-10-CM | POA: Diagnosis not present

## 2021-04-17 DIAGNOSIS — M899 Disorder of bone, unspecified: Secondary | ICD-10-CM | POA: Diagnosis not present

## 2021-04-17 DIAGNOSIS — I10 Essential (primary) hypertension: Secondary | ICD-10-CM

## 2021-04-17 DIAGNOSIS — F17210 Nicotine dependence, cigarettes, uncomplicated: Secondary | ICD-10-CM | POA: Insufficient documentation

## 2021-04-17 DIAGNOSIS — Z515 Encounter for palliative care: Secondary | ICD-10-CM

## 2021-04-17 DIAGNOSIS — R531 Weakness: Secondary | ICD-10-CM

## 2021-04-17 LAB — CBC WITH DIFFERENTIAL (CANCER CENTER ONLY)
Abs Immature Granulocytes: 0.15 10*3/uL — ABNORMAL HIGH (ref 0.00–0.07)
Basophils Absolute: 0.1 10*3/uL (ref 0.0–0.1)
Basophils Relative: 1 %
Eosinophils Absolute: 0.1 10*3/uL (ref 0.0–0.5)
Eosinophils Relative: 1 %
HCT: 34.9 % — ABNORMAL LOW (ref 39.0–52.0)
Hemoglobin: 11.9 g/dL — ABNORMAL LOW (ref 13.0–17.0)
Immature Granulocytes: 2 %
Lymphocytes Relative: 27 %
Lymphs Abs: 2.3 10*3/uL (ref 0.7–4.0)
MCH: 29.9 pg (ref 26.0–34.0)
MCHC: 34.1 g/dL (ref 30.0–36.0)
MCV: 87.7 fL (ref 80.0–100.0)
Monocytes Absolute: 0.6 10*3/uL (ref 0.1–1.0)
Monocytes Relative: 7 %
Neutro Abs: 5.2 10*3/uL (ref 1.7–7.7)
Neutrophils Relative %: 62 %
Platelet Count: 204 10*3/uL (ref 150–400)
RBC: 3.98 MIL/uL — ABNORMAL LOW (ref 4.22–5.81)
RDW: 14.7 % (ref 11.5–15.5)
WBC Count: 8.4 10*3/uL (ref 4.0–10.5)
nRBC: 0.5 % — ABNORMAL HIGH (ref 0.0–0.2)

## 2021-04-17 LAB — CMP (CANCER CENTER ONLY)
ALT: 10 U/L (ref 0–44)
AST: 19 U/L (ref 15–41)
Albumin: 4 g/dL (ref 3.5–5.0)
Alkaline Phosphatase: 443 U/L — ABNORMAL HIGH (ref 38–126)
Anion gap: 9 (ref 5–15)
BUN: 13 mg/dL (ref 8–23)
CO2: 25 mmol/L (ref 22–32)
Calcium: 9.6 mg/dL (ref 8.9–10.3)
Chloride: 105 mmol/L (ref 98–111)
Creatinine: 1.15 mg/dL (ref 0.61–1.24)
GFR, Estimated: >60 mL/min (ref >60)
Glucose, Bld: 130 mg/dL — ABNORMAL HIGH (ref 70–99)
Potassium: 3.9 mmol/L (ref 3.5–5.1)
Sodium: 139 mmol/L (ref 135–145)
Total Bilirubin: 0.6 mg/dL (ref 0.3–1.2)
Total Protein: 6.7 g/dL (ref 6.5–8.1)

## 2021-04-17 MED ORDER — OXYCODONE HCL 5 MG PO TABS: 5.0000 mg | ORAL_TABLET | ORAL | 0 refills | Status: DC | PRN

## 2021-04-17 MED ORDER — XTAMPZA ER 9 MG PO C12A: 9.0000 mg | EXTENDED_RELEASE_CAPSULE | Freq: Three times a day (TID) | ORAL | 0 refills | Status: DC

## 2021-04-17 MED ORDER — LEUPROLIDE ACETATE (3 MONTH) 22.5 MG IM KIT
22.5000 mg | PACK | Freq: Once | INTRAMUSCULAR | Status: DC
  Filled 2021-04-17: qty 22.5

## 2021-04-17 NOTE — Progress Notes (Signed)
[facility]  [phone] [phone]   Name: Randall Larsen. Date: 04/17/2021 MRN: [id number]  DOB: 07-02-1955  Patient Care Team: [name], MD as PCP - General (Family Medicine)    REASON FOR CONSULTATION: Randall Larsen. is a 66 y.o. male with medical history hypertension and metastatic castrate sensitive prostate cancer currently taking bicalutamide and he received his first Lupron injection on today.  Palliative ask to see for symptom management.   SOCIAL HISTORY:     reports that he has been smoking cigarettes. He has a 25.00 pack-year smoking history. He has never used smokeless tobacco. He reports that he does not currently use alcohol. 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  ADVANCE DIRECTIVES:  None on file  CODE STATUS: Full code  PAST MEDICAL HISTORY: Past Medical History:  Diagnosis Date   Hypertension    Prostate cancer ([place])     PAST SURGICAL HISTORY: No past surgical history on file.  HEMATOLOGY/ONCOLOGY HISTORY:  Oncology History   No history exists.    ALLERGIES:  has No Known Allergies.  MEDICATIONS:  Current Outpatient Medications  Medication Sig Dispense Refill   amLODipine (NORVASC) 10 MG tablet Take 1 tablet (10 mg total) by mouth daily. 30 tablet 2   bicalutamide (CASODEX) 50 MG tablet Take 1 tablet (50 mg total) by mouth daily. 30 tablet 0   docusate sodium (COLACE) 100 MG capsule Take 1 capsule (100 mg total) by mouth every 12 (twelve) hours. 60 capsule 0   hydrALAZINE (APRESOLINE) 50 MG tablet Take 1 tablet (50 mg total) by mouth 3 (three) times daily. 90 tablet 2   metoprolol succinate (TOPROL-XL) 100 MG 24 hr tablet Take 1 tablet (100 mg total) by mouth daily. 30 tablet 2   Multiple Vitamins-Minerals (ONE-A-DAY MENS 50+) TABS Take 1 tablet by mouth daily.     oxyCODONE (OXY IR/ROXICODONE) 5 MG immediate release  tablet Take 1-2 tablets (5-10 mg total) by mouth every 4 (four) hours as needed for severe pain or breakthrough pain. 180 tablet 0   oxyCODONE ER (XTAMPZA ER) 9 MG C12A Take 9 mg by mouth every 8 (eight) hours. 60 capsule 0   polyethylene glycol (MIRALAX / GLYCOLAX) 17 g packet Take 17 g by mouth 2 (two) times daily. 14 each 0   No current facility-administered medications for this visit.    VITAL SIGNS: There were no vitals taken for this visit. There were no vitals filed for this visit.  Estimated body mass index is 30.62 kg/m as calculated from the following:   Height as of 03/05/21: 5\' 11"  (1.803 m).   Weight as of an earlier encounter on 04/17/21: 219 lb 9 oz (99.6 kg).  LABS: CBC:    Component Value Date/Time   WBC 8.4 04/17/2021 1000   WBC 10.3 04/02/2021 1742   HGB 11.9 (L) 04/17/2021 1000   HCT 34.9 (L) 04/17/2021 1000   PLT 204 04/17/2021 1000   MCV 87.7 04/17/2021 1000   NEUTROABS 5.2 04/17/2021 1000   LYMPHSABS 2.3 04/17/2021 1000   MONOABS 0.6 04/17/2021 1000   EOSABS 0.1 04/17/2021 1000   BASOSABS 0.1 04/17/2021 1000   Comprehensive Metabolic Panel:    Component Value Date/Time   NA 139 04/17/2021 1000   K 3.9 04/17/2021 1000   CL 105 04/17/2021 1000   CO2 25 04/17/2021 1000   BUN 13 04/17/2021 1000  CREATININE 1.15 04/17/2021 1000   GLUCOSE 130 (H) 04/17/2021 1000   CALCIUM 9.6 04/17/2021 1000   AST 19 04/17/2021 1000   ALT 10 04/17/2021 1000   ALKPHOS 443 (H) 04/17/2021 1000   BILITOT 0.6 04/17/2021 1000   PROT 6.7 04/17/2021 1000   ALBUMIN 4.0 04/17/2021 1000    RADIOGRAPHIC STUDIES: CT ABDOMEN PELVIS W CONTRAST  Result Date: 04/02/2021 CLINICAL DATA:  Abdominal pain.  History of prostate cancer. EXAM: CT ABDOMEN AND PELVIS WITH CONTRAST TECHNIQUE: Multidetector CT imaging of the abdomen and pelvis was performed using the standard protocol following bolus administration of intravenous contrast. CONTRAST:  7mL OMNIPAQUE IOHEXOL 350 MG/ML SOLN  COMPARISON:  CT abdomen pelvis dated 02/23/2021. FINDINGS: Lower chest: Bibasilar dependent atelectasis.  Mild cardiomegaly. No intra-abdominal free air or free fluid. Hepatobiliary: No focal liver abnormality is seen. No gallstones, gallbladder wall thickening, or biliary dilatation. Pancreas: Unremarkable. No pancreatic ductal dilatation or surrounding inflammatory changes. Spleen: Normal in size without focal abnormality. Adrenals/Urinary Tract: The adrenal glands are unremarkable. The kidneys, visualized ureters appear unremarkable. The urinary bladder is partially distended. There is indentation of the base of the bladder by the enlarged and irregular prostate gland. Stomach/Bowel: There is moderate stool throughout the colon. There is herniation of several loops of small bowel into the right inguinal canal as well as herniation of the appendix. No evidence of obstruction or inflammation. Vascular/Lymphatic: Mild aortoiliac atherosclerotic disease. The IVC is unremarkable no portal venous gas. Several mildly enlarged pelvic lymph nodes similar to prior CT. A nodular density or lymph node in the anterior pelvis to the right of the urinary bladder measures 11 mm in short axis similar to prior CT. Reproductive: Enlarged and irregular prostate gland measuring approximately 5.5 cm in transverse diameter. There is indentation of the base of the bladder. A 5 x 6 cm cystic structure superior to the right testicle may represent a hydrocele or a spermatocele. This is similar to prior CT. Other: None Musculoskeletal: Extensive osseous metastatic disease, progressed since the CT of 02/23/2021. Age indeterminate compression fracture of T9, new since the CT of 02/08/2021. Correlation with clinical exam and point tenderness recommended no other acute osseous pathology. IMPRESSION: 1. Right inguinal hernia containing several loops of small bowel as well as the appendix. No evidence of obstruction or inflammation. 2. Enlarged  and irregular prostate gland in keeping with known prostate cancer. There is indentation of the base of the bladder. 3. Extensive osseous metastatic disease, progressed since the CT of 02/23/2021. 4. Age indeterminate compression fracture of T9, new since the CT of 02/08/2021. Correlation with clinical exam and point tenderness recommended. 5. Aortic Atherosclerosis (ICD10-I70.0). Electronically Signed   By: Anner Crete M.D.   On: 04/02/2021 21:57   VAS [place] LOWER EXTREMITY VENOUS (DVT)  Result Date: 04/07/2021  Lower Venous DVT Study Patient Name:  Randall Larsen.  Date of Exam:   04/07/2021 Medical Rec #: [id number]          Accession #:    [id number] Date of Birth: February 24, 1956          Patient Gender: M Patient Age:   33 years Exam Location:  [facility] Procedure:      VAS [place] LOWER EXTREMITY VENOUS (DVT) Referring Phys: Sherol Dade --------------------------------------------------------------------------------  Indications: Swelling.  Risk Factors: Cancer. Limitations: Poor ultrasound/tissue interface. Comparison Study: No prior studies. Performing Technologist: Oliver Hum RVT  Examination Guidelines: A complete evaluation includes B-mode imaging, spectral Doppler, color Doppler, and power  Doppler as needed of all accessible portions of each vessel. Bilateral testing is considered an integral part of a complete examination. Limited examinations for reoccurring indications may be performed as noted. The reflux portion of the exam is performed with the patient in reverse Trendelenburg.  +-----+---------------+---------+-----------+----------+--------------+  RIGHT Compressibility Phasicity Spontaneity Properties Thrombus Aging  +-----+---------------+---------+-----------+----------+--------------+  CFV   Full            Yes       Yes                                    +-----+---------------+---------+-----------+----------+--------------+    +---------+---------------+---------+-----------+----------+--------------+  LEFT      Compressibility Phasicity Spontaneity Properties Thrombus Aging  +---------+---------------+---------+-----------+----------+--------------+  CFV       Full            Yes       Yes                                    +---------+---------------+---------+-----------+----------+--------------+  SFJ       Full                                                             +---------+---------------+---------+-----------+----------+--------------+  FV Prox   Full                                                             +---------+---------------+---------+-----------+----------+--------------+  FV Mid    Full                                                             +---------+---------------+---------+-----------+----------+--------------+  FV Distal Full                                                             +---------+---------------+---------+-----------+----------+--------------+  PFV       Full                                                             +---------+---------------+---------+-----------+----------+--------------+  POP       Full            Yes       Yes                                    +---------+---------------+---------+-----------+----------+--------------+  PTV       Full                                                             +---------+---------------+---------+-----------+----------+--------------+  PERO      Full                                                             +---------+---------------+---------+-----------+----------+--------------+    Summary: RIGHT: - No evidence of common femoral vein obstruction.  LEFT: - There is no evidence of deep vein thrombosis in the lower extremity.  - No cystic structure found in the popliteal fossa.  *See table(s) above for measurements and observations. Electronically signed by Monica Martinez MD on 04/07/2021 at 4:23:23 PM.    Final      PERFORMANCE STATUS (ECOG) : 2 - Symptomatic, <50% confined to bed  Review of Systems  Constitutional:  Positive for fatigue.  Musculoskeletal:  Positive for arthralgias and back pain.  Neurological:  Positive for weakness.  Unless otherwise noted, a complete review of systems is negative.  Physical Exam General: NAD, chronically ill-appearing Cardiovascular: regular rate and rhythm, trace lower extremity edema Pulmonary: clear ant fields Abdomen: soft, nontender, + bowel sounds Skin: no rashes Neurological: Weakness, AAOx3  IMPRESSION: This is my initial meeting outpatient with Randall Larsen. He was unfortunately unable to make previous appointments due to some home and transportation concerns when previously scheduled.   I introduced myself, Advertising copywriter, and Palliative's role in collaboration with the oncology team. Concept of Palliative Care was introduced as specialized medical care for people and their families living with serious illness.  It focuses on providing relief from the symptoms and stress of a serious illness.  The goal is to improve quality of life for both the patient and the family. Values and goals of care important to patient and family were attempted to be elicited.   Randall Larsen reports he lives in the home alone.  He has 2 children who he is close with.  He also has shares he and his ex-wife have a close relationship and she assist him with his healthcare needs.  At home he is ambulatory but at times requires a walker or to hold onto things due to his gait instability and pain.  Reports his appetite is good.  Able to perform all ADLs independently although require frequent rest breaks.  Cancer related pain Randall Larsen complains of significant pain which is uncontrolled.  This will often interfere with how his day goes.  Majority of his pain is in his midsternal area, right hip, and mid to lower back.  He does use a heating pack however this does not provide much relief.  We  reviewed at length his current pain regimen.  He is currently taking Xtampza ER 9 mg every 12 hours with Oxy IR 5-10 mg every 4 hours as needed for breakthrough pain.  He reports he generally requires taking breakthrough medication around-the-clock.  We discussed adjusting his regimen with a goal of better pain relief and some improvement in his quality of life.  We will  increase his Xtampza ER to every 8 hours with continued Oxy IR for breakthrough.  He knows we will plan to follow-up next week for ongoing evaluation.  Constipation Reports some ongoing constipation.  Recommended continuing MiraLAX twice daily in the setting of opioid use.  He reports when he does consistently take this it is helpful.  Emotional support Randall Larsen shares his frustration with his financial concerns in addition to his metastatic cancer.  He has been battling the court system and his landlord for over a month due to behind payments and requests to be evicted despite making all efforts to catch up.  He is working with [place] here at the [facility] for financial support.  Randall Larsen speaks to his understanding of poor long-term prognosis.  He shares he has a strong [place] faith and is remaining hopeful for the best.  He becomes tearful expressing he does not have any grandchildren yet and this is his hopes is to see both if not one of his children become parents.  Emotional support provided.  I discussed the importance of continued conversation with family and their medical providers regarding overall plan of care and treatment options, ensuring decisions are within the context of the patients values and GOCs.  PLAN: Xtampza ER 9 mg every 8 hours Oxy IR 5-10 mg every 4 hours as needed for breakthrough pain MiraLAX twice daily for bowel regimen in the setting of opioid use I will plan to see patient back in 1-2 weeks in collaboration to other oncology appointments.  We will plan to have close follow-up for symptom  management.  Patient expressed understanding and was in agreement with this plan. He also understands that He can call the clinic at any time with any questions, concerns, or complaints.   Time Total: 50 min  Visit consisted of counseling and education dealing with the complex and emotionally intense issues of symptom management and palliative care in the setting of serious and potentially life-threatening illness.Greater than 50%  of this time was spent counseling and coordinating care related to the above assessment and plan.  Signed by: Alda Lea, AGPCNP-BC Palliative Medicine Team

## 2021-04-17 NOTE — Progress Notes (Signed)
Met with patient at registration to obtain documentation for grants.  Patient approved for one-time $1000 [employer] grant to assist with personal expenses while going through treatment. Discussed in detail expenses and how they are covered. He has a copy of the approval letter and expense sheet along with the Outpatient pharmacy information.  Patient also approved for one-time $200 [employer] grant for medication and visa cards only. He received a gift card today from this grant.  He has all paperwork and my card in green folder for any additional financial questions or concerns.

## 2021-04-17 NOTE — Progress Notes (Signed)
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 Patient declined intervention at that time.  02/13/2021: establish care with [name]/Dr. Lorenso Courier. PSA 494. Started bicalutamide 50 mg PO daily.  02/23/2021: NM bone scan and CT abdomen pelvis show multifocal osseous metastatic disease. CT scan showed enlarged heterogeneous prostate gland concern for diffuse tumor involvement. Additionally noted to have bilateral iliac and perirectal metastatic adenopathy.  04/17/2021: lupron 22.5mg  IM, initial dose.   Interval History:  Randall Larsen. 66 y.o. male with medical history significant for metastatic prostate cancer who presents for a follow up visit. The patient's last visit was on 02/13/2021. In the interim since the last visit he has started on bicalutamide therapy but has not shown for his lupron shot.   On exam today Randall Larsen continues to struggle with pain.  He reports that he is having pain in his sternum as well as his right hip area.  He notes that he is having difficulty laying down to sleep and that mostly he sits on the sofa and reclines in order to get rest.  He notes that he does occasionally have chills for which heating pads have helped.  He denies any urinary symptoms but does endorse having shortness of breath.  He is having pitting edema in his lower extremities.  At rest his pain is typically a 0-10 in severity but with any type of movement his pain increases up to about a 6 out of 10 in severity.  He notes he is tolerating the bicalutamide pills well without any major side effects or symptoms.  He currently denies any fevers, sweats, nausea,  vomiting, or diarrhea.  A full 10 point ROS is listed below.  MEDICAL HISTORY:  Past Medical History:  Diagnosis Date   Hypertension    Prostate cancer ([place])     SURGICAL HISTORY: No past surgical history on file.  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  ALLERGIES:  has No Known Allergies.  MEDICATIONS:  Current Outpatient Medications  Medication Sig Dispense Refill   amLODipine (NORVASC) 10  MG tablet Take 1 tablet (10 mg total) by mouth daily. 30 tablet 2   bicalutamide (CASODEX) 50 MG tablet Take 1 tablet (50 mg total) by mouth daily. 30 tablet 0   docusate sodium (COLACE) 100 MG capsule Take 1 capsule (100 mg total) by mouth every 12 (twelve) hours. 60 capsule 0   hydrALAZINE (APRESOLINE) 50 MG tablet Take 1 tablet (50 mg total) by mouth 3 (three) times daily. 90 tablet 2   metoprolol succinate (TOPROL-XL) 100 MG 24 hr tablet Take 1 tablet (100 mg total) by mouth daily. 30 tablet 2   Multiple Vitamins-Minerals (ONE-A-DAY MENS 50+) TABS Take 1 tablet  by mouth daily.     oxyCODONE (OXY IR/ROXICODONE) 5 MG immediate release tablet Take 1-2 tablets (5-10 mg total) by mouth every 4 (four) hours as needed for severe pain or breakthrough pain. 180 tablet 0   oxyCODONE ER (XTAMPZA ER) 9 MG C12A Take 9 mg by mouth every 8 (eight) hours. 60 capsule 0   polyethylene glycol (MIRALAX / GLYCOLAX) 17 g packet Take 17 g by mouth 2 (two) times daily. 14 each 0   No current facility-administered medications for this visit.    REVIEW OF SYSTEMS:   Constitutional: ( - ) fevers, ( - )  chills , ( - ) night sweats Eyes: ( - ) blurriness of vision, ( - ) double vision, ( - ) watery eyes Ears, nose, mouth, throat, and face: ( - ) mucositis, ( - ) sore throat Respiratory: ( - ) cough, ( - ) dyspnea, ( - ) wheezes Cardiovascular: ( - ) palpitation, ( - ) chest discomfort, ( - ) lower extremity swelling Gastrointestinal:  ( - ) nausea, ( - ) heartburn, ( - ) change in bowel habits Skin: ( - ) abnormal skin rashes Lymphatics: ( - ) new lymphadenopathy, ( - ) easy bruising Neurological: ( - ) numbness, ( - ) tingling, ( - ) new weaknesses Behavioral/Psych: ( - ) mood change, ( - ) new changes  All other systems were reviewed with the patient and are negative.  PHYSICAL EXAMINATION:  Vitals:   04/17/21 1030  BP: (!) 167/66  Pulse: 79  Resp: 16  Temp: 97.7 F (36.5 C)  SpO2: 99%   Filed Weights   04/17/21 1030  Weight: 219 lb 9 oz (99.6 kg)    GENERAL: chronically ill appearing elderly African American male, alert, no distress and comfortable SKIN: skin color, texture, turgor are normal, no rashes or significant lesions EYES: conjunctiva are pink and non-injected, sclera clear LUNGS: clear to auscultation and percussion with normal breathing effort HEART: regular rate & rhythm and no murmurs and no lower extremity edema Musculoskeletal: no cyanosis of digits and no clubbing  PSYCH: alert & oriented x 3, fluent speech NEURO: no focal motor/sensory  deficits  LABORATORY DATA:  I have reviewed the data as listed CBC Latest Ref Rng & Units 04/17/2021 04/07/2021 04/02/2021  WBC 4.0 - 10.5 K/uL 8.4 9.0 10.3  Hemoglobin 13.0 - 17.0 g/dL 11.9(L) 12.1(L) 13.6  Hematocrit 39.0 - 52.0 % 34.9(L) 35.8(L) 39.6  Platelets 150 - 400 K/uL 204 179 182    CMP Latest Ref Rng & Units 04/17/2021 04/07/2021 04/02/2021  Glucose 70 - 99 mg/dL 130(H) 118(H) 100(H)  BUN 8 - 23 mg/dL 13 17 21   Creatinine 0.61 - 1.24 mg/dL 1.15 1.40(H) 1.52(H)  Sodium 135 - 145 mmol/L 139 139 139  Potassium 3.5 - 5.1 mmol/L 3.9 3.8 3.4(L)  Chloride  98 - 111 mmol/L 105 103 104  CO2 22 - 32 mmol/L 25 28 23   Calcium 8.9 - 10.3 mg/dL 9.6 10.1 10.7(H)  Total Protein 6.5 - 8.1 g/dL 6.7 6.7 7.5  Total Bilirubin 0.3 - 1.2 mg/dL 0.6 0.6 0.7  Alkaline Phos 38 - 126 U/L 443(H) 283(H) 431(H)  AST 15 - 41 U/L 19 21 52(H)  ALT 0 - 44 U/L 10 19 21     Lab Results  Component Value Date   MPROTEIN Not Observed 02/13/2021   Lab Results  Component Value Date   KPAFRELGTCHN 27.9 (H) 02/13/2021   LAMBDASER 16.1 02/13/2021   KAPLAMBRATIO 1.73 (H) 02/13/2021    RADIOGRAPHIC STUDIES: CT ABDOMEN PELVIS W CONTRAST  Result Date: 04/02/2021 CLINICAL DATA:  Abdominal pain.  History of prostate cancer. EXAM: CT ABDOMEN AND PELVIS WITH CONTRAST TECHNIQUE: Multidetector CT imaging of the abdomen and pelvis was performed using the standard protocol following bolus administration of intravenous contrast. CONTRAST:  27mL OMNIPAQUE IOHEXOL 350 MG/ML SOLN COMPARISON:  CT abdomen pelvis dated 02/23/2021. FINDINGS: Lower chest: Bibasilar dependent atelectasis.  Mild cardiomegaly. No intra-abdominal free air or free fluid. Hepatobiliary: No focal liver abnormality is seen. No gallstones, gallbladder wall thickening, or biliary dilatation. Pancreas: Unremarkable. No pancreatic ductal dilatation or surrounding inflammatory changes. Spleen: Normal in size without focal abnormality. Adrenals/Urinary Tract: The  adrenal glands are unremarkable. The kidneys, visualized ureters appear unremarkable. The urinary bladder is partially distended. There is indentation of the base of the bladder by the enlarged and irregular prostate gland. Stomach/Bowel: There is moderate stool throughout the colon. There is herniation of several loops of small bowel into the right inguinal canal as well as herniation of the appendix. No evidence of obstruction or inflammation. Vascular/Lymphatic: Mild aortoiliac atherosclerotic disease. The IVC is unremarkable no portal venous gas. Several mildly enlarged pelvic lymph nodes similar to prior CT. A nodular density or lymph node in the anterior pelvis to the right of the urinary bladder measures 11 mm in short axis similar to prior CT. Reproductive: Enlarged and irregular prostate gland measuring approximately 5.5 cm in transverse diameter. There is indentation of the base of the bladder. A 5 x 6 cm cystic structure superior to the right testicle may represent a hydrocele or a spermatocele. This is similar to prior CT. Other: None Musculoskeletal: Extensive osseous metastatic disease, progressed since the CT of 02/23/2021. Age indeterminate compression fracture of T9, new since the CT of 02/08/2021. Correlation with clinical exam and point tenderness recommended no other acute osseous pathology. IMPRESSION: 1. Right inguinal hernia containing several loops of small bowel as well as the appendix. No evidence of obstruction or inflammation. 2. Enlarged and irregular prostate gland in keeping with known prostate cancer. There is indentation of the base of the bladder. 3. Extensive osseous metastatic disease, progressed since the CT of 02/23/2021. 4. Age indeterminate compression fracture of T9, new since the CT of 02/08/2021. Correlation with clinical exam and point tenderness recommended. 5. Aortic Atherosclerosis (ICD10-I70.0). Electronically Signed   By: Anner Crete M.D.   On: 04/02/2021 21:57    VAS [place] LOWER EXTREMITY VENOUS (DVT)  Result Date: 04/07/2021  Lower Venous DVT Study Patient Name:  Randall Larsen.  Date of Exam:   04/07/2021 Medical Rec #: [id number]          Accession #:    [id number] Date of Birth: 1956/02/27          Patient Gender: M Patient Age:   79 years  Exam Location:  [facility] Procedure:      VAS [place] LOWER EXTREMITY VENOUS (DVT) Referring Phys: Sherol Dade --------------------------------------------------------------------------------  Indications: Swelling.  Risk Factors: Cancer. Limitations: Poor ultrasound/tissue interface. Comparison Study: No prior studies. Performing Technologist: Oliver Hum RVT  Examination Guidelines: A complete evaluation includes B-mode imaging, spectral Doppler, color Doppler, and power Doppler as needed of all accessible portions of each vessel. Bilateral testing is considered an integral part of a complete examination. Limited examinations for reoccurring indications may be performed as noted. The reflux portion of the exam is performed with the patient in reverse Trendelenburg.  +-----+---------------+---------+-----------+----------+--------------+  RIGHT Compressibility Phasicity Spontaneity Properties Thrombus Aging  +-----+---------------+---------+-----------+----------+--------------+  CFV   Full            Yes       Yes                                    +-----+---------------+---------+-----------+----------+--------------+   +---------+---------------+---------+-----------+----------+--------------+  LEFT      Compressibility Phasicity Spontaneity Properties Thrombus Aging  +---------+---------------+---------+-----------+----------+--------------+  CFV       Full            Yes       Yes                                    +---------+---------------+---------+-----------+----------+--------------+  SFJ       Full                                                              +---------+---------------+---------+-----------+----------+--------------+  FV Prox   Full                                                             +---------+---------------+---------+-----------+----------+--------------+  FV Mid    Full                                                             +---------+---------------+---------+-----------+----------+--------------+  FV Distal Full                                                             +---------+---------------+---------+-----------+----------+--------------+  PFV       Full                                                             +---------+---------------+---------+-----------+----------+--------------+  POP       Full            Yes       Yes                                    +---------+---------------+---------+-----------+----------+--------------+  PTV       Full                                                             +---------+---------------+---------+-----------+----------+--------------+  PERO      Full                                                             +---------+---------------+---------+-----------+----------+--------------+    Summary: RIGHT: - No evidence of common femoral vein obstruction.  LEFT: - There is no evidence of deep vein thrombosis in the lower extremity.  - No cystic structure found in the popliteal fossa.  *See table(s) above for measurements and observations. Electronically signed by Monica Martinez MD on 04/07/2021 at 4:23:23 PM.    Final     ASSESSMENT & PLAN Randall Larsen 66 y.o. male with medical history significant for metastatic prostate cancer who presents for a follow up visit.   After review the labs, review the records, discussion with the patient the findings are most consistent with metastatic prostate cancer with spread to the bones.  The patient has a markedly elevated PSA at 494.  Given the clinical picture biopsy is not required at this time.  We begin treatment with  bicalutamide 50 mg p.o. daily with the intention of starting Lupron therapy 2 weeks after initiation of treatment.  Unfortunate the patient no showed for the scheduled injections and has continued on bicalutamide until we were able to administer Lupron.  His first Lupron injection was administered on 04/17/2021.  We will plan to continue bicalutamide for an additional 2 weeks after this injection and then consider the addition of abiraterone to his treatment regimen.  # Metastatic Prostate Cancer # Metastatic Spread to Bones --patient started on bicalutamide 50mg  PO daily with intention of starting lupron after 2 weeks of treatment. This started on 02/13/2021, but patient did not show for lupron injection. Continued bicalutamide --plan for lupron 22.5mg  injection today, continue q 3 months --continue bicalutamide for another 2 weeks time.  --plan to start abiraterone 1000mg  PO daily at next visit in 4 weeks time.  --RTC in 4 weeks to re-evaluate.   # Pain Control  -- Patient is currently taking oxycodone 5 mg IR 1 to 2 tablets every 4 hours as well as Xtampza ER 9 mg every 8 hours --Patient to be seen by palliative care today to assist with pain management --We will make referral to radiation oncology for consideration of palliative radiation to painful bone metastases sites --Referral to home health physical therapy   Orders Placed This Encounter  Procedures   Ambulatory referral to Radiation Oncology    Referral Priority:   Routine    Referral Type:  Consultation    Referral Reason:   Specialty Services Required    Requested Specialty:   Radiation Oncology    Number of Visits Requested:   1    All questions were answered. The patient knows to call the clinic with any problems, questions or concerns.  A total of more than 30 minutes were spent on this encounter with face-to-face time and non-face-to-face time, including preparing to see the patient, ordering tests and/or medications,  counseling the patient and coordination of care as outlined above.   Ledell Peoples, MD Department of Hematology/Oncology [facility] Phone: [phone] Pager: [phone] Email: [email or web address]  04/18/2021 1:30 PM

## 2021-04-18 ENCOUNTER — Telehealth: Payer: Self-pay | Admitting: Nurse Practitioner

## 2021-04-18 ENCOUNTER — Telehealth: Payer: Self-pay | Admitting: *Deleted

## 2021-04-18 ENCOUNTER — Encounter: Payer: Self-pay | Admitting: Physician Assistant

## 2021-04-18 LAB — TESTOSTERONE: Testosterone: 715 ng/dL (ref 264–916)

## 2021-04-18 LAB — PROSTATE-SPECIFIC AG, SERUM (LABCORP): Prostate Specific Ag, Serum: 253 ng/mL — ABNORMAL HIGH (ref 0.0–4.0)

## 2021-04-18 NOTE — Telephone Encounter (Signed)
Scheduled per 1/9 los, pt has been called and confirmed appt

## 2021-04-18 NOTE — Telephone Encounter (Signed)
Patient called to clarify the pain medication regimen that [facility] gave him yesterday.  Explained the dosage to the patient, no further questions about that it this time.  He also questioned when the Lupron would take effect because he is having a lot of pain.  Explained that he could experience a flare up in the pain immediately after the injection but that the total effect for the Lupron would take some time.     He stated understanding.

## 2021-04-19 ENCOUNTER — Telehealth: Payer: Self-pay

## 2021-04-19 NOTE — Telephone Encounter (Signed)
Returned patients calls from after hours line. Patient had questions about side effects from Lupron. Medication side effects reviewed. Pain medications reviewed. Patient encouraged to use distraction techniques along with pain medications and to use Colace and Miralax for potential constipation.  Patient encouraged to call with further questions or concerns.

## 2021-04-20 ENCOUNTER — Telehealth: Payer: Self-pay

## 2021-04-20 NOTE — Telephone Encounter (Signed)
Mr. Brigham called our office in regards to having severe pain. He stated that the pain was in his R chest and lower back. This has occurred since his injection on Monday. He stated that he was never told about this as a side effect and expressed frustration. Listening and support provided. I advised him, per Lexine Baton, NP, to take an extra one time dose of his Xtampza and to take 2 Oxycodone tabs every 4 hours around the clock. I told him I would follow up with him first thing in the morning to see how he is doing. Gratitude and understanding expressed. All questions answered.

## 2021-04-21 ENCOUNTER — Telehealth: Payer: Self-pay

## 2021-04-21 ENCOUNTER — Telehealth: Payer: Self-pay | Admitting: *Deleted

## 2021-04-21 NOTE — Telephone Encounter (Signed)
I called Randall Larsen this morning to follow up with his pain control. He stated that the extra Xtampza helped yesterday but that he is still having considerable pain. He again voiced his frustrations with not knowing about this pain. Emotional listening and support offered.  I advised Randall Larsen, per Lexine Baton, NP, that he can take his Ginger Organ every 8 hours over the weekend and that we will have an in-person visit with him at 9am on Monday to evaluate his pain management and control. I advised him to bring his pain medications in so that we would be able to help if any refills are needed. Understanding and gratitude verbalized. All questions/concerns addressed. Advised to call our office with any other questions.

## 2021-04-21 NOTE — Telephone Encounter (Signed)
Received call from pt. He states he continues to be in horrible pain-his right chest and his right hip. He has spoken to Palliative care and his Ginger Organ has been increased to every 8 hours, his oxycodone 5 mg 2 tabs every 4 hours. He gets very little relief with this. He is asking if he could go to the ED for pain.and would he get something stronger there. Advised that he can go to [facility] ED for uncontrolled pain and that they likely could give him something stronger, they could also possibly admit him to the hospital for pain control if needed.  Pt states he will take his meds as ordered and try to get through the weekend. He has a Palliative Care appt on Monday, 04/24/21 @ 9am .Transportation is being arranged per Gabriel Rung, RN (Palliative Care)Supported pt's decision to go to ED if needed over the weekend. Pt voiced understanding.

## 2021-04-24 ENCOUNTER — Inpatient Hospital Stay: Payer: Medicare HMO | Admitting: Nurse Practitioner

## 2021-04-25 ENCOUNTER — Other Ambulatory Visit: Payer: Self-pay

## 2021-04-25 ENCOUNTER — Inpatient Hospital Stay (HOSPITAL_BASED_OUTPATIENT_CLINIC_OR_DEPARTMENT_OTHER): Payer: Medicare HMO | Admitting: Nurse Practitioner

## 2021-04-25 ENCOUNTER — Encounter: Payer: Self-pay | Admitting: Nurse Practitioner

## 2021-04-25 ENCOUNTER — Other Ambulatory Visit: Payer: Self-pay | Admitting: Hematology and Oncology

## 2021-04-25 VITALS — BP 164/85 | HR 73 | Temp 98.1°F | Resp 18 | Wt 222.4 lb

## 2021-04-25 DIAGNOSIS — Z515 Encounter for palliative care: Secondary | ICD-10-CM | POA: Diagnosis not present

## 2021-04-25 DIAGNOSIS — M899 Disorder of bone, unspecified: Secondary | ICD-10-CM

## 2021-04-25 DIAGNOSIS — G893 Neoplasm related pain (acute) (chronic): Secondary | ICD-10-CM

## 2021-04-25 DIAGNOSIS — C61 Malignant neoplasm of prostate: Secondary | ICD-10-CM

## 2021-04-25 DIAGNOSIS — R531 Weakness: Secondary | ICD-10-CM

## 2021-04-25 DIAGNOSIS — R530 Neoplastic (malignant) related fatigue: Secondary | ICD-10-CM | POA: Diagnosis not present

## 2021-04-25 DIAGNOSIS — K5903 Drug induced constipation: Secondary | ICD-10-CM

## 2021-04-25 NOTE — Progress Notes (Signed)
[name]  [phone] [phone]) [phone]   Name: Randall Larsen. Date: 04/25/2021 MRN: [id number]  DOB: [birth year]  Patient Care Team: [name], MD as PCP - General (Family Medicine)    INTERVAL HISTORY: Randall Larsen. is a 66 y.o. male with hypertension and metastatic castrate sensitive prostate cancer currently taking bicalutamide and he received his first Lupron injection on 04/17/21. Reports uncontrollable pain since injection.  Palliative ask to see for symptom management.  SOCIAL HISTORY:     reports that he has been smoking cigarettes. He has a 25.00 pack-year smoking history. He has never used smokeless tobacco. He reports that he does not currently use alcohol. 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  ADVANCE DIRECTIVES:  None on file  CODE STATUS: Full code  PAST MEDICAL HISTORY: Past Medical History:  Diagnosis Date   Hypertension    Prostate cancer ([place])     ALLERGIES:  has No Known Allergies.  MEDICATIONS:  Current Outpatient Medications  Medication Sig Dispense Refill   amLODipine (NORVASC) 10 MG tablet Take 1 tablet (10 mg total) by mouth daily. 30 tablet 2   bicalutamide (CASODEX) 50 MG tablet Take 1 tablet (50 mg total) by mouth daily. 30 tablet 0   docusate sodium (COLACE) 100 MG capsule Take 1 capsule (100 mg total) by mouth every 12 (twelve) hours. 60 capsule 0   hydrALAZINE (APRESOLINE) 50 MG tablet Take 1 tablet (50 mg total) by mouth 3 (three) times daily. 90 tablet 2   metoprolol succinate (TOPROL-XL) 100 MG 24 hr tablet Take 1 tablet (100 mg total) by mouth daily. 30 tablet 2   Multiple Vitamins-Minerals (ONE-A-DAY MENS 50+) TABS Take 1 tablet by mouth daily.     oxyCODONE (OXY IR/ROXICODONE) 5 MG immediate release tablet Take 1-2 tablets (5-10 mg total) by mouth every 4 (four) hours as needed for severe pain or breakthrough  pain. 180 tablet 0   oxyCODONE ER (XTAMPZA ER) 9 MG C12A Take 9 mg by mouth every 8 (eight) hours. 60 capsule 0   polyethylene glycol (MIRALAX / GLYCOLAX) 17 g packet Take 17 g by mouth 2 (two) times daily. 14 each 0   No current facility-administered medications for this visit.    VITAL SIGNS: BP (!) 164/85 (BP Location: Left Arm, Patient Position: Sitting)    Pulse 73    Temp 98.1 F (36.7 C) (Oral)    Resp 18    Wt 222 lb 6.4 oz (100.9 kg)    SpO2 99%    BMI 31.02 kg/m  Filed Weights   04/25/21 1017  Weight: 222 lb 6.4 oz (100.9 kg)    Estimated body mass index is 31.02 kg/m as calculated from the following:   Height as of 03/05/21: 5\' 11"  (1.803 m).   Weight as of this encounter: 222 lb 6.4 oz (100.9 kg).   PERFORMANCE STATUS (ECOG) : 2 - Symptomatic, <50% confined to bed   Physical Exam General: NAD, appears weak, in a wheelchair Cardiovascular: regular rate and rhythm Pulmonary: clear ant fields Abdomen: soft, nontender, + bowel sounds Extremities: bilateral ankle edema Neurological: Weakness but otherwise nonfocal  IMPRESSION:  Randall Larsen presents to clinic today for follow-up. He is alone. Requires transportation assistance for appointments. Appears weak but no acute distress. He is in a wheelchair. Reports he is ambulatory in the home but with some gait instability. Is requesting  a cane for added support. Declines walker.   Randall Larsen continues to have financial challenges. He is going back and forth to court with his landlord. Shares he became behind on his rent during previous hospitalization and trying to get things resolved. Is worried he will be evicted and is trying to consider back-up options. Emotional support provided. He shares he has a son who comes over and check on him but unable to provide additional support.   Cancer related pain Randall Larsen shares his concerns with ongoing pain. He feels as though his pain has escalated since receiving his initial Lupron  injection. Education provided on side effects of injection which could include generalized pain which should decrease over time. He verbalized understanding expressing he was not prepared for such response. Discussed although we do not know how each person will respond to medication it is the best treatment option for him as per his Oncologist. He verbalized understanding. We discussed his current pain regimen. He is tolerating the Xtampza evry 8hrs. He is taking Oxy IR 10 mg every 6 hours as needed for pain. Education provided that he is able to take every 4 hrs if needed. He reports majority of his pain is generalized but with more focus on his back area. Education provided on use of lidocaine patches in addition to current regimen for added support.   Constipation Feels this is managed with use of Miralax. He endorses he does not take it as requested daily and will go several days without a bowel movement. Education provided on need to take daily to facilitate regular schedule with awareness constipation can also contribute to his discomfort.   I discussed the importance of continued conversation with family and their medical providers regarding overall plan of care and treatment options, ensuring decisions are within the context of the patients values and GOCs.  PLAN: Lidocaine 5% patch to back daily  Xtampza 9 mg every 8 hours Oxy IR 5-10 mg every 4 hours as needed for breakthrough pain Warm compress for non-pharmacological support.  Miralax daily for bowel regimen Will order quad cane for additional support  Will plan phone visit in 1-2 weeks and office visit in 2-3 weeks in collaboration with other oncology appointments.   Patient expressed understanding and was in agreement with this plan. He also understands that He can call the clinic at any time with any questions, concerns, or complaints.   Time Total: 45 min   Visit consisted of counseling and education dealing with the complex and  emotionally intense issues of symptom management and palliative care in the setting of serious and potentially life-threatening illness.Greater than 50%  of this time was spent counseling and coordinating care related to the above assessment and plan.  Signed by: Alda Lea, AGPCNP-BC [place]

## 2021-04-25 NOTE — Progress Notes (Signed)
Order for a [employer] placed to [employer] through [employer] for Mr. Zietlow.

## 2021-04-26 ENCOUNTER — Telehealth: Payer: Self-pay | Admitting: Nurse Practitioner

## 2021-04-26 ENCOUNTER — Encounter: Payer: Self-pay | Admitting: Physician Assistant

## 2021-04-26 MED ORDER — OXYCODONE HCL 5 MG PO TABS
5.0000 mg | ORAL_TABLET | ORAL | 0 refills | Status: DC | PRN
Start: 2021-04-27 — End: 2021-05-30

## 2021-04-26 MED ORDER — ONDANSETRON HCL 4 MG PO TABS: 4.0000 mg | ORAL_TABLET | Freq: Three times a day (TID) | ORAL | 0 refills | Status: DC | PRN

## 2021-04-26 MED ORDER — LIDOCAINE 5 % EX PTCH: 1.0000 | MEDICATED_PATCH | CUTANEOUS | 0 refills | Status: AC

## 2021-04-26 MED ORDER — OXYCODONE HCL 5 MG PO TABS: 5.0000 mg | ORAL_TABLET | ORAL | 0 refills | Status: DC | PRN

## 2021-04-26 NOTE — Telephone Encounter (Signed)
Scheduled per 1/17 los, pt has been called and confirmed

## 2021-04-27 ENCOUNTER — Encounter: Payer: Self-pay | Admitting: Nurse Practitioner

## 2021-04-27 ENCOUNTER — Telehealth: Payer: Self-pay

## 2021-04-27 NOTE — Telephone Encounter (Signed)
Notified Patient of prior authorization approval for Lidocaine 5% Patches. Medication is authorized through 04/08/2022

## 2021-05-01 ENCOUNTER — Other Ambulatory Visit: Payer: Self-pay

## 2021-05-01 ENCOUNTER — Emergency Department (HOSPITAL_COMMUNITY): Payer: Medicare HMO

## 2021-05-01 ENCOUNTER — Encounter: Payer: Self-pay | Admitting: Nurse Practitioner

## 2021-05-01 ENCOUNTER — Inpatient Hospital Stay (HOSPITAL_COMMUNITY)
Admission: EM | Admit: 2021-05-01 | Discharge: 2021-05-30 | DRG: 948 | Disposition: A | Discharge status: Rehabilitation Facility | Payer: Medicare HMO | Attending: Family Medicine | Admitting: Family Medicine

## 2021-05-01 ENCOUNTER — Encounter (HOSPITAL_COMMUNITY): Payer: Self-pay

## 2021-05-01 ENCOUNTER — Emergency Department (HOSPITAL_COMMUNITY)
Admission: EM | Admit: 2021-05-01 | Discharge: 2021-05-01 | Disposition: A | Discharge status: Home / Self Care | Payer: Medicare HMO | Source: Home / Self Care | Attending: Emergency Medicine | Admitting: Emergency Medicine

## 2021-05-01 ENCOUNTER — Telehealth: Payer: Self-pay

## 2021-05-01 ENCOUNTER — Encounter (HOSPITAL_COMMUNITY): Payer: Self-pay | Admitting: Emergency Medicine

## 2021-05-01 DIAGNOSIS — R609 Edema, unspecified: Secondary | ICD-10-CM

## 2021-05-01 DIAGNOSIS — Z28310 Unvaccinated for covid-19: Secondary | ICD-10-CM

## 2021-05-01 DIAGNOSIS — R52 Pain, unspecified: Secondary | ICD-10-CM | POA: Diagnosis not present

## 2021-05-01 DIAGNOSIS — M899 Disorder of bone, unspecified: Secondary | ICD-10-CM

## 2021-05-01 DIAGNOSIS — C61 Malignant neoplasm of prostate: Secondary | ICD-10-CM | POA: Diagnosis present

## 2021-05-01 DIAGNOSIS — K5900 Constipation, unspecified: Secondary | ICD-10-CM | POA: Diagnosis not present

## 2021-05-01 DIAGNOSIS — R0789 Other chest pain: Secondary | ICD-10-CM | POA: Insufficient documentation

## 2021-05-01 DIAGNOSIS — R339 Retention of urine, unspecified: Secondary | ICD-10-CM | POA: Diagnosis not present

## 2021-05-01 DIAGNOSIS — F419 Anxiety disorder, unspecified: Secondary | ICD-10-CM | POA: Diagnosis not present

## 2021-05-01 DIAGNOSIS — R338 Other retention of urine: Secondary | ICD-10-CM

## 2021-05-01 DIAGNOSIS — Z79899 Other long term (current) drug therapy: Secondary | ICD-10-CM | POA: Insufficient documentation

## 2021-05-01 DIAGNOSIS — Z515 Encounter for palliative care: Secondary | ICD-10-CM

## 2021-05-01 DIAGNOSIS — M48061 Spinal stenosis, lumbar region without neurogenic claudication: Secondary | ICD-10-CM | POA: Diagnosis present

## 2021-05-01 DIAGNOSIS — D649 Anemia, unspecified: Secondary | ICD-10-CM | POA: Diagnosis present

## 2021-05-01 DIAGNOSIS — Z8546 Personal history of malignant neoplasm of prostate: Secondary | ICD-10-CM

## 2021-05-01 DIAGNOSIS — R0902 Hypoxemia: Secondary | ICD-10-CM | POA: Diagnosis not present

## 2021-05-01 DIAGNOSIS — R079 Chest pain, unspecified: Secondary | ICD-10-CM | POA: Diagnosis present

## 2021-05-01 DIAGNOSIS — I10 Essential (primary) hypertension: Secondary | ICD-10-CM | POA: Diagnosis not present

## 2021-05-01 DIAGNOSIS — Z8249 Family history of ischemic heart disease and other diseases of the circulatory system: Secondary | ICD-10-CM

## 2021-05-01 DIAGNOSIS — G8220 Paraplegia, unspecified: Secondary | ICD-10-CM

## 2021-05-01 DIAGNOSIS — Z20822 Contact with and (suspected) exposure to covid-19: Secondary | ICD-10-CM | POA: Diagnosis present

## 2021-05-01 DIAGNOSIS — R600 Localized edema: Secondary | ICD-10-CM

## 2021-05-01 DIAGNOSIS — C7951 Secondary malignant neoplasm of bone: Secondary | ICD-10-CM | POA: Diagnosis present

## 2021-05-01 DIAGNOSIS — G893 Neoplasm related pain (acute) (chronic): Principal | ICD-10-CM | POA: Diagnosis present

## 2021-05-01 DIAGNOSIS — F17210 Nicotine dependence, cigarettes, uncomplicated: Secondary | ICD-10-CM | POA: Diagnosis present

## 2021-05-01 DIAGNOSIS — M8458XA Pathological fracture in neoplastic disease, other specified site, initial encounter for fracture: Secondary | ICD-10-CM | POA: Diagnosis present

## 2021-05-01 DIAGNOSIS — J90 Pleural effusion, not elsewhere classified: Secondary | ICD-10-CM | POA: Diagnosis present

## 2021-05-01 DIAGNOSIS — M4804 Spinal stenosis, thoracic region: Secondary | ICD-10-CM | POA: Diagnosis present

## 2021-05-01 DIAGNOSIS — G9529 Other cord compression: Secondary | ICD-10-CM | POA: Diagnosis present

## 2021-05-01 DIAGNOSIS — W19XXXA Unspecified fall, initial encounter: Secondary | ICD-10-CM

## 2021-05-01 DIAGNOSIS — E669 Obesity, unspecified: Secondary | ICD-10-CM | POA: Diagnosis present

## 2021-05-01 DIAGNOSIS — Z720 Tobacco use: Secondary | ICD-10-CM | POA: Insufficient documentation

## 2021-05-01 DIAGNOSIS — Z6831 Body mass index (BMI) 31.0-31.9, adult: Secondary | ICD-10-CM

## 2021-05-01 DIAGNOSIS — G839 Paralytic syndrome, unspecified: Secondary | ICD-10-CM | POA: Diagnosis present

## 2021-05-01 LAB — CBC WITH DIFFERENTIAL/PLATELET
Abs Immature Granulocytes: 0.27 10*3/uL — ABNORMAL HIGH (ref 0.00–0.07)
Basophils Absolute: 0.1 10*3/uL (ref 0.0–0.1)
Basophils Relative: 1 %
Eosinophils Absolute: 0.1 10*3/uL (ref 0.0–0.5)
Eosinophils Relative: 1 %
HCT: 35.5 % — ABNORMAL LOW (ref 39.0–52.0)
Hemoglobin: 11.9 g/dL — ABNORMAL LOW (ref 13.0–17.0)
Immature Granulocytes: 3 %
Lymphocytes Relative: 29 %
Lymphs Abs: 3 10*3/uL (ref 0.7–4.0)
MCH: 29.9 pg (ref 26.0–34.0)
MCHC: 33.5 g/dL (ref 30.0–36.0)
MCV: 89.2 fL (ref 80.0–100.0)
Monocytes Absolute: 1.1 10*3/uL — ABNORMAL HIGH (ref 0.1–1.0)
Monocytes Relative: 11 %
Neutro Abs: 5.8 10*3/uL (ref 1.7–7.7)
Neutrophils Relative %: 55 %
Platelets: 254 10*3/uL (ref 150–400)
RBC: 3.98 MIL/uL — ABNORMAL LOW (ref 4.22–5.81)
RDW: 15.4 % (ref 11.5–15.5)
WBC: 10.2 10*3/uL (ref 4.0–10.5)
nRBC: 2.8 % — ABNORMAL HIGH (ref 0.0–0.2)

## 2021-05-01 LAB — DIFFERENTIAL
Abs Immature Granulocytes: 0.41 10*3/uL — ABNORMAL HIGH (ref 0.00–0.07)
Basophils Absolute: 0.1 10*3/uL (ref 0.0–0.1)
Basophils Relative: 1 %
Eosinophils Absolute: 0 10*3/uL (ref 0.0–0.5)
Eosinophils Relative: 0 %
Immature Granulocytes: 4 %
Lymphocytes Relative: 17 %
Lymphs Abs: 1.6 10*3/uL (ref 0.7–4.0)
Monocytes Absolute: 0.9 10*3/uL (ref 0.1–1.0)
Monocytes Relative: 9 %
Neutro Abs: 6.5 10*3/uL (ref 1.7–7.7)
Neutrophils Relative %: 69 %

## 2021-05-01 LAB — COMPREHENSIVE METABOLIC PANEL
ALT: 14 U/L (ref 0–44)
AST: 29 U/L (ref 15–41)
Albumin: 4.1 g/dL (ref 3.5–5.0)
Alkaline Phosphatase: 440 U/L — ABNORMAL HIGH (ref 38–126)
Anion gap: 9 (ref 5–15)
BUN: 14 mg/dL (ref 8–23)
CO2: 24 mmol/L (ref 22–32)
Calcium: 9.3 mg/dL (ref 8.9–10.3)
Chloride: 107 mmol/L (ref 98–111)
Creatinine, Ser: 0.99 mg/dL (ref 0.61–1.24)
GFR, Estimated: >60 mL/min (ref >60)
Glucose, Bld: 133 mg/dL — ABNORMAL HIGH (ref 70–99)
Potassium: 3.6 mmol/L (ref 3.5–5.1)
Sodium: 140 mmol/L (ref 135–145)
Total Bilirubin: 0.5 mg/dL (ref 0.3–1.2)
Total Protein: 7.6 g/dL (ref 6.5–8.1)

## 2021-05-01 LAB — CBC
HCT: 32.5 % — ABNORMAL LOW (ref 39.0–52.0)
Hemoglobin: 11 g/dL — ABNORMAL LOW (ref 13.0–17.0)
MCH: 30 pg (ref 26.0–34.0)
MCHC: 33.8 g/dL (ref 30.0–36.0)
MCV: 88.6 fL (ref 80.0–100.0)
Platelets: 213 10*3/uL (ref 150–400)
RBC: 3.67 MIL/uL — ABNORMAL LOW (ref 4.22–5.81)
RDW: 15.4 % (ref 11.5–15.5)
WBC: 9.4 10*3/uL (ref 4.0–10.5)
nRBC: 2.2 % — ABNORMAL HIGH (ref 0.0–0.2)

## 2021-05-01 LAB — HEPATIC FUNCTION PANEL
ALT: 13 U/L (ref 0–44)
AST: 36 U/L (ref 15–41)
Albumin: 3.9 g/dL (ref 3.5–5.0)
Alkaline Phosphatase: 410 U/L — ABNORMAL HIGH (ref 38–126)
Bilirubin, Direct: 0.1 mg/dL (ref 0.0–0.2)
Indirect Bilirubin: 0.6 mg/dL (ref 0.3–0.9)
Total Bilirubin: 0.7 mg/dL (ref 0.3–1.2)
Total Protein: 7 g/dL (ref 6.5–8.1)

## 2021-05-01 LAB — RESP PANEL BY RT-PCR (FLU A&B, COVID) ARPGX2
Influenza A by PCR: NEGATIVE
Influenza B by PCR: NEGATIVE
SARS Coronavirus 2 by RT PCR: NEGATIVE

## 2021-05-01 LAB — TROPONIN I (HIGH SENSITIVITY)
Troponin I (High Sensitivity): 27 ng/L — ABNORMAL HIGH (ref <18)
Troponin I (High Sensitivity): 27 ng/L — ABNORMAL HIGH (ref <18)
Troponin I (High Sensitivity): 25 ng/L — ABNORMAL HIGH (ref <18)
Troponin I (High Sensitivity): 25 ng/L — ABNORMAL HIGH (ref <18)

## 2021-05-01 LAB — MAGNESIUM: Magnesium: 2 mg/dL (ref 1.7–2.4)

## 2021-05-01 LAB — PHOSPHORUS: Phosphorus: 3 mg/dL (ref 2.5–4.6)

## 2021-05-01 LAB — CK: Total CK: 105 U/L (ref 49–397)

## 2021-05-01 LAB — D-DIMER, QUANTITATIVE: D-Dimer, Quant: 13 ug/mL-FEU — ABNORMAL HIGH (ref 0.00–0.50)

## 2021-05-01 IMAGING — DX DG CHEST 1V PORT
2 series · 2 of 2 positions shown · non-contrast
Comparison: [DATE]

CLINICAL DATA: Chest pain

EXAM:
PORTABLE CHEST 1 VIEW

[chest ap (1 of 2)]
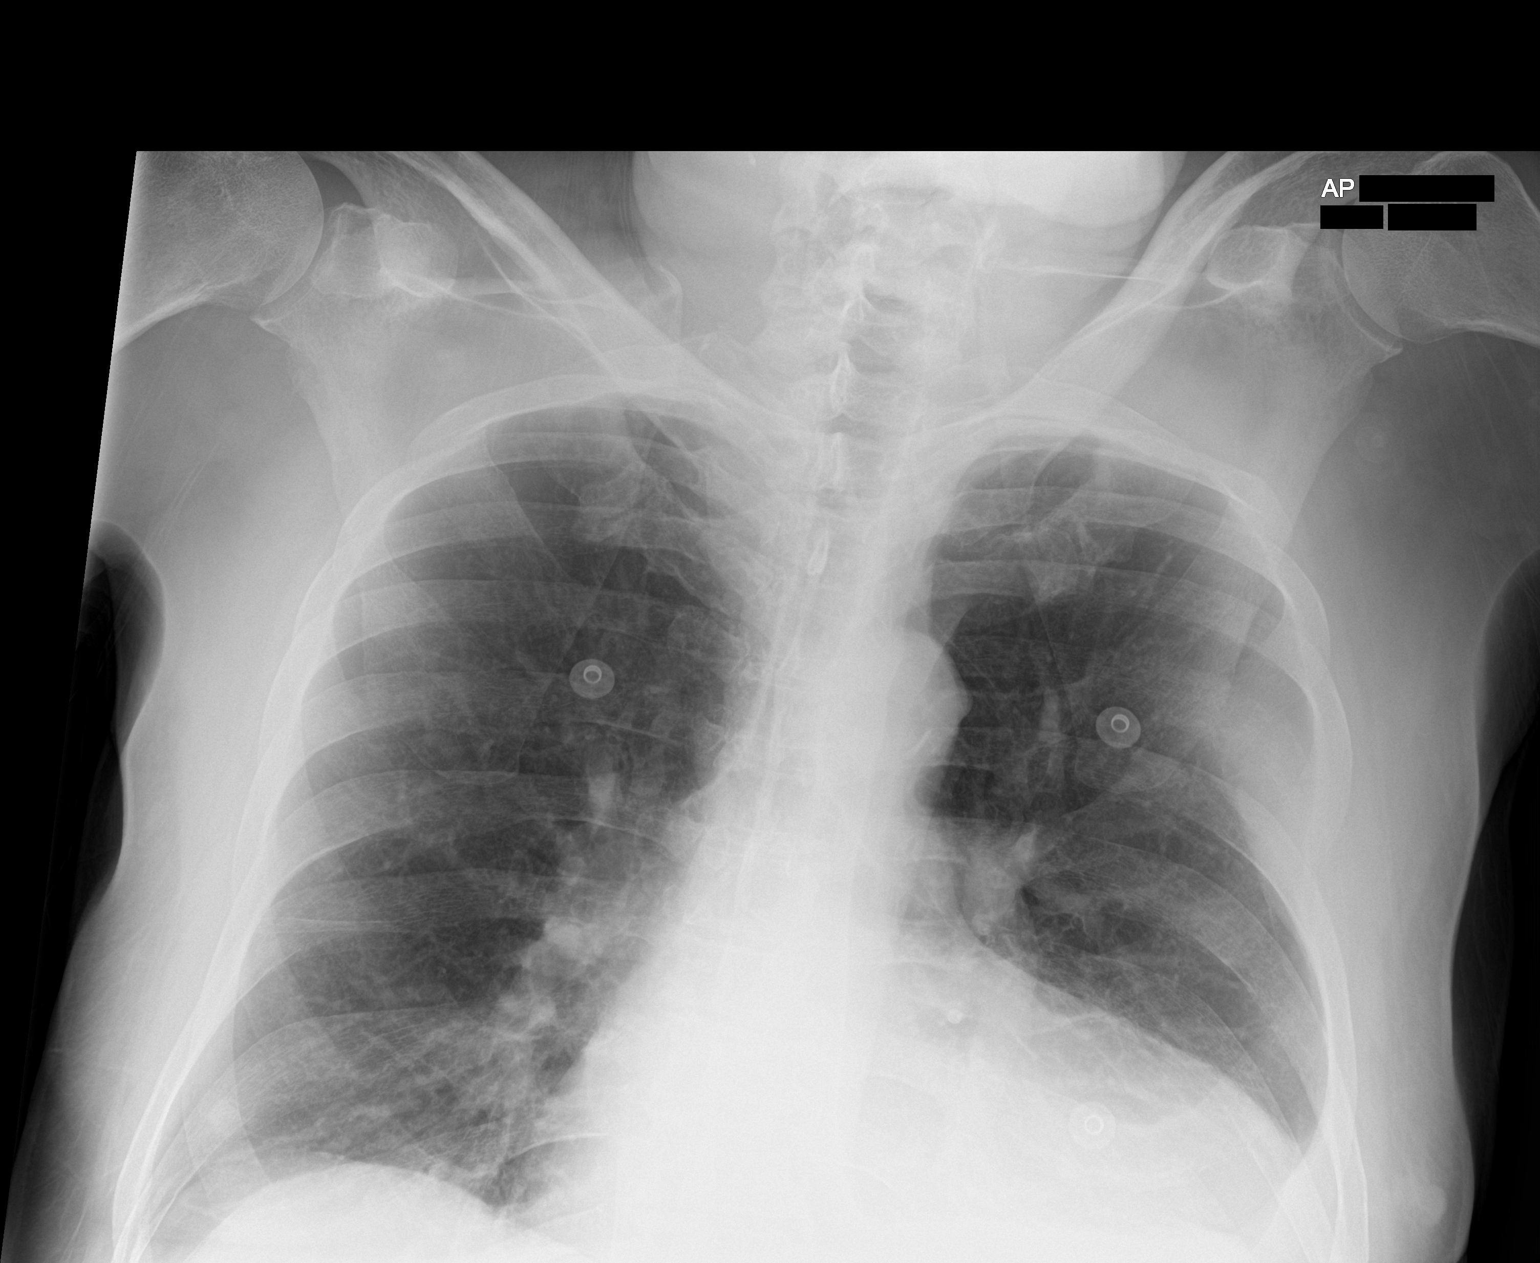

[chest ap (2 of 2)]
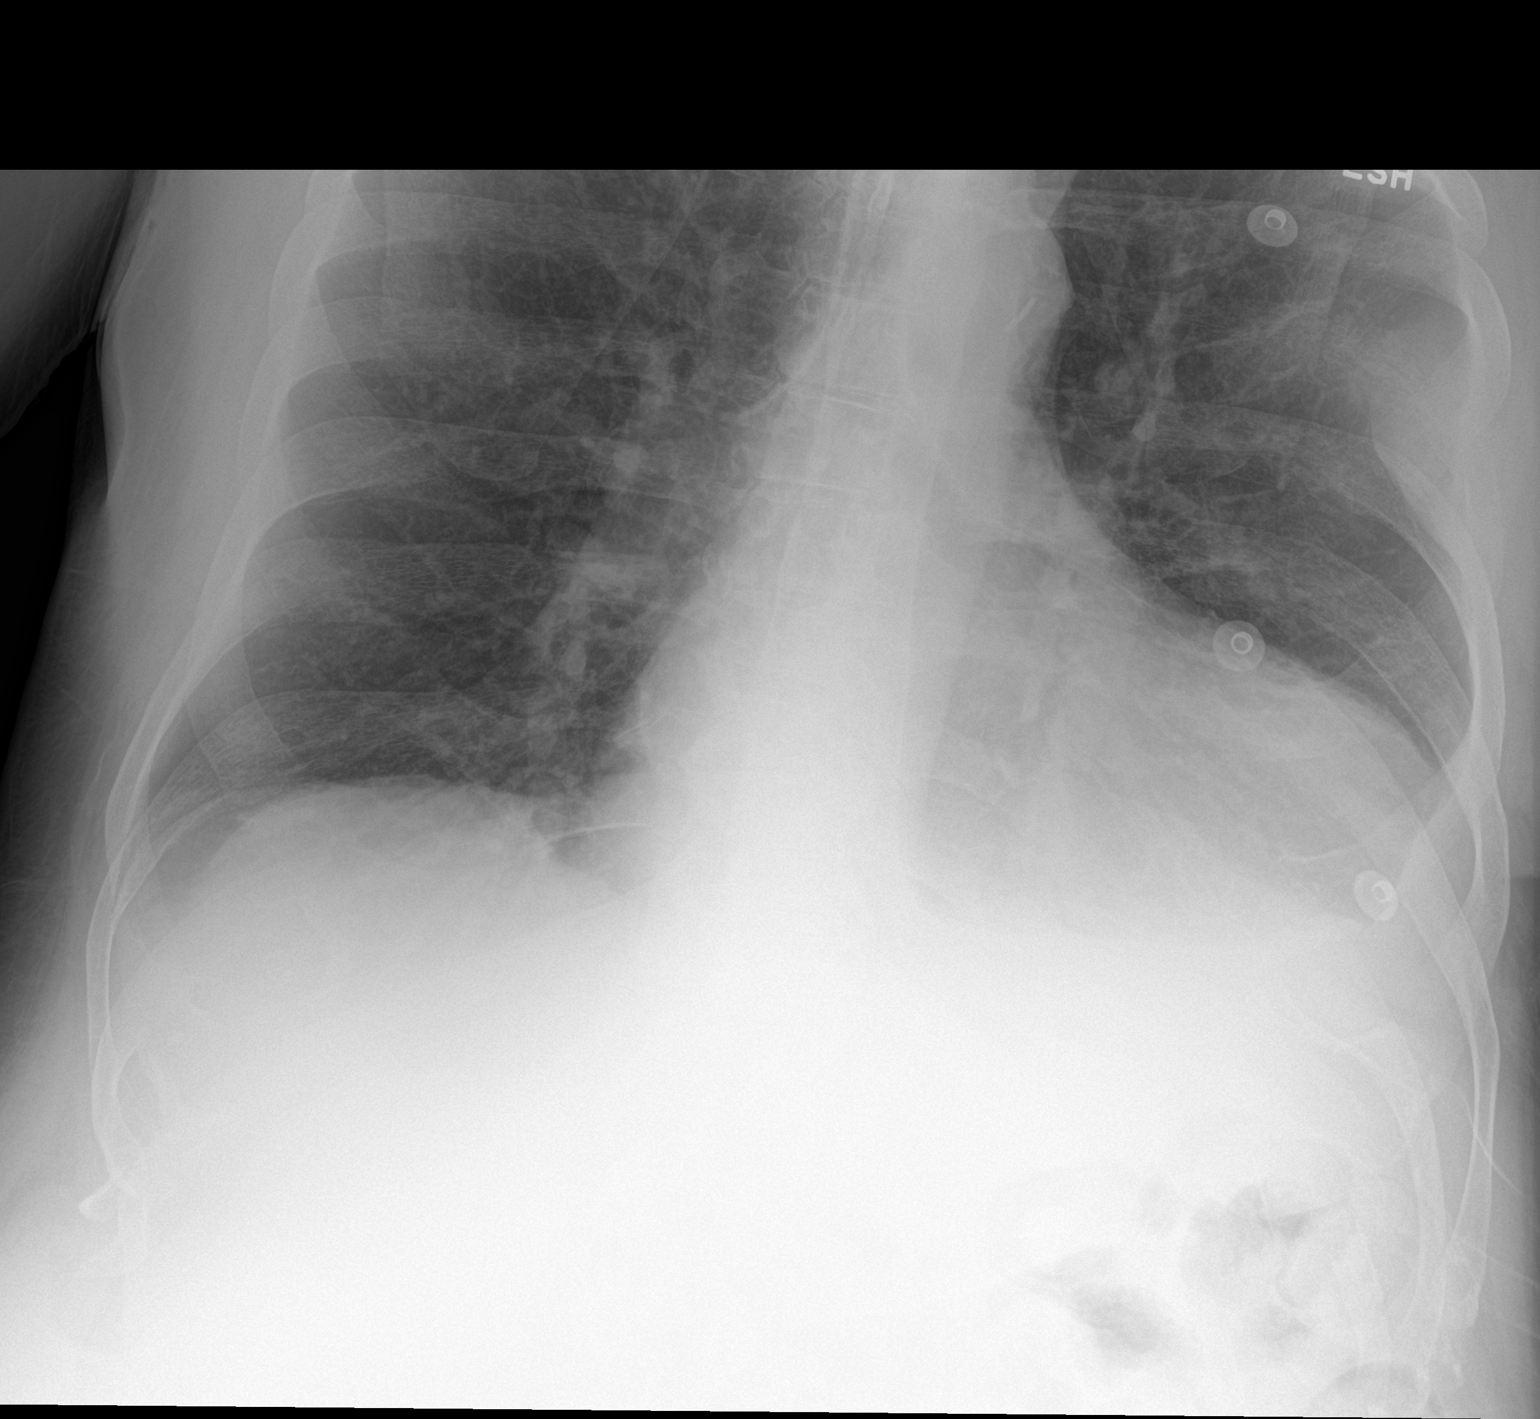

[2 of 2 positions shown; findings below may reference images not displayed]

FINDINGS: Small left pleural effusion with adjacent atelectasis. Similar
cardiomegaly. No pneumothorax. Left pleural thickening laterally
underlying rib lesions seen on CT.
IMPRESSION: Small left pleural effusion with adjacent atelectasis.

## 2021-05-01 IMAGING — CT CT ANGIO CHEST
3 of 7 series · 17 of 36 positions shown · IV contrast (agent unspecified)
Comparison: [DATE]

CLINICAL DATA: Chest pain and elevated D-dimer. Prostate cancer
with bone metastases.

EXAM:
CT ANGIOGRAPHY CHEST WITH CONTRAST
TECHNIQUE: Multidetector CT imaging of the chest was performed using the
standard protocol during bolus administration of intravenous
contrast. Multiplanar CT image reconstructions and MIPs were
obtained to evaluate the vascular anatomy.

[Series 5: thins · axial · 0.76mm/px · z∈[+20,+272]mm · 12 of 298 slices shown]
[im 23/298  lung]
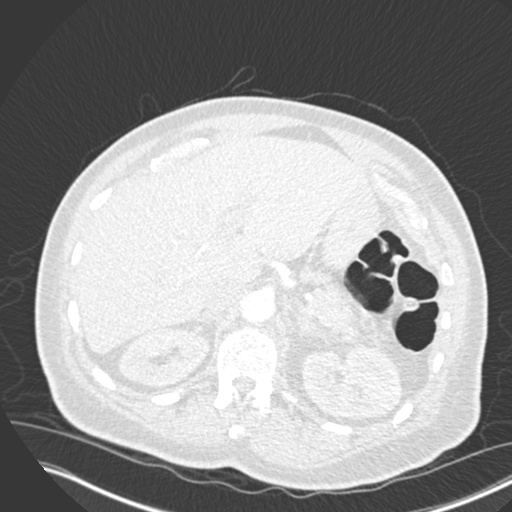
[im 46/298  mediastinal]
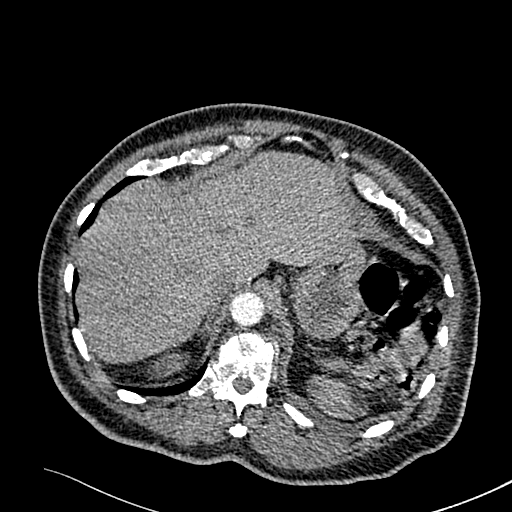
[im 69/298  lung]
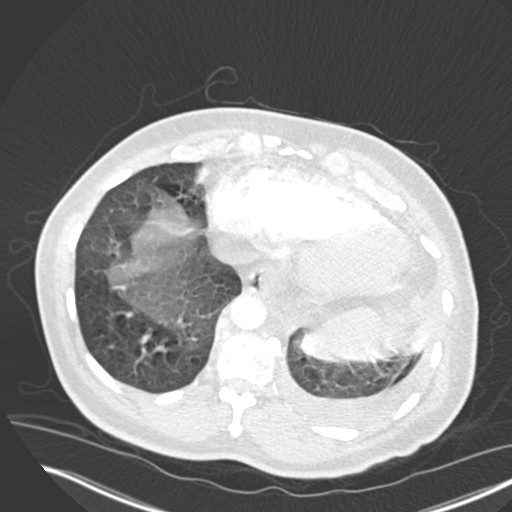
[im 92/298  mediastinal]
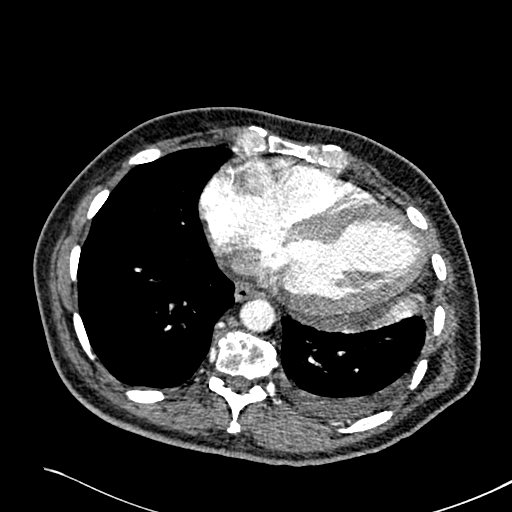
[im 115/298  lung]
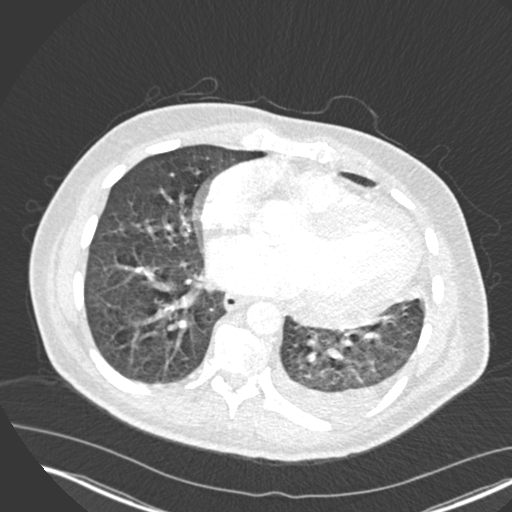
[im 138/298  mediastinal]
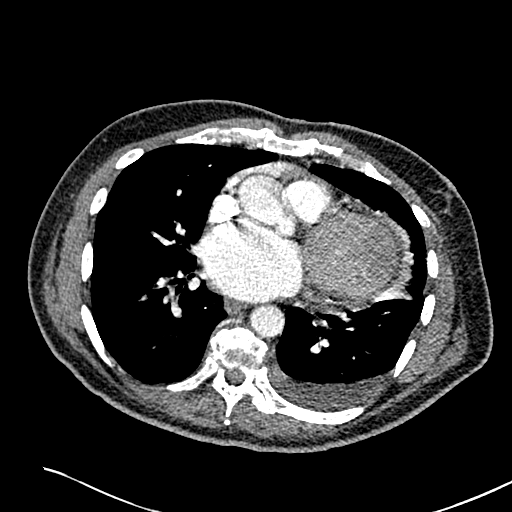
[im 160/298  lung]
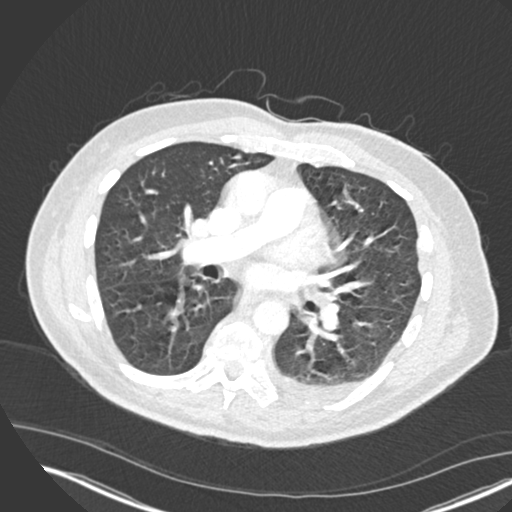
[im 183/298  mediastinal]
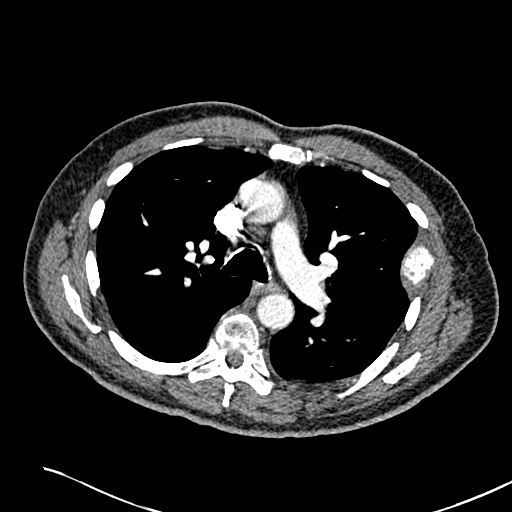
[im 206/298  lung]
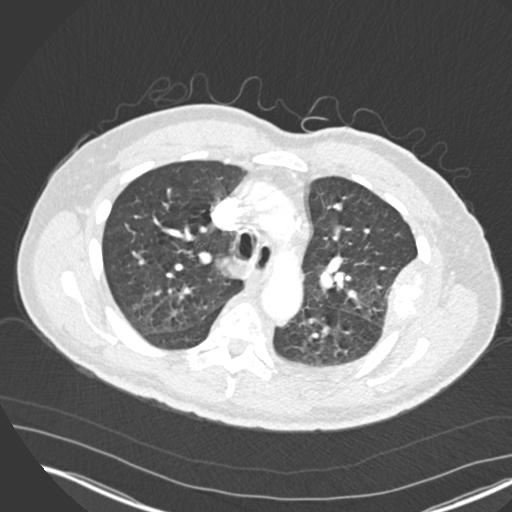
[im 229/298  mediastinal]
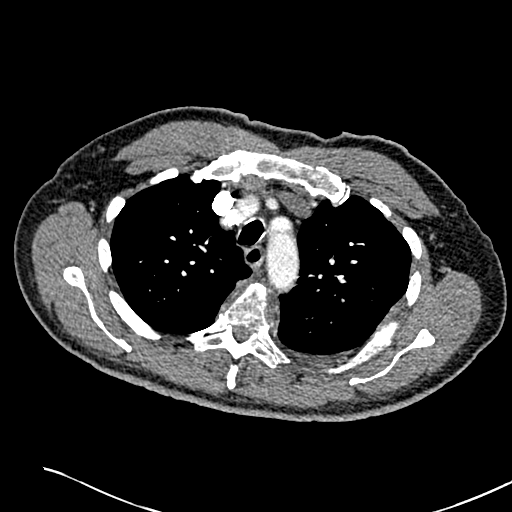
[im 252/298  lung]
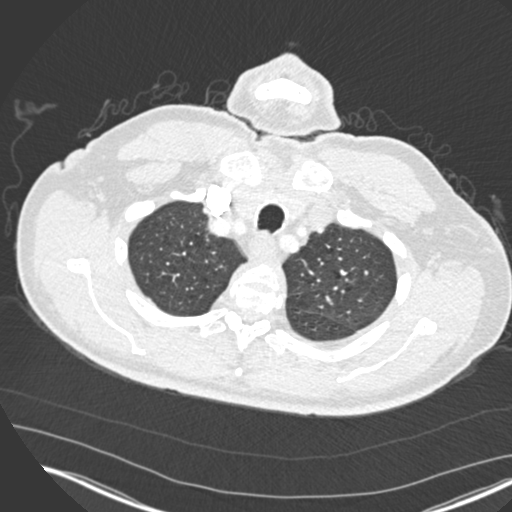
[im 275/298  mediastinal]
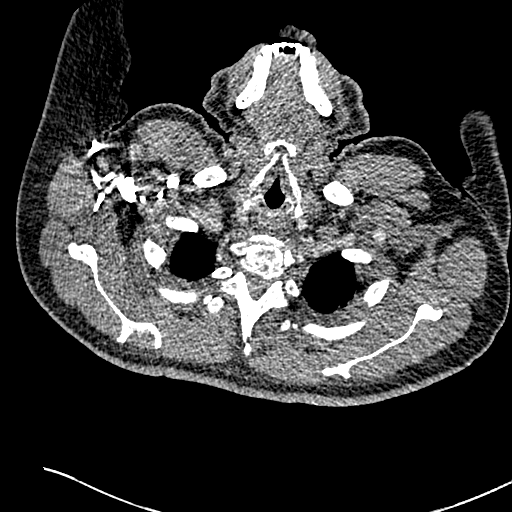

[Series 6: coronal mpr · coronal · 0.60mm/px · 1 of 151 slices shown]
[im 76/151  mediastinal]
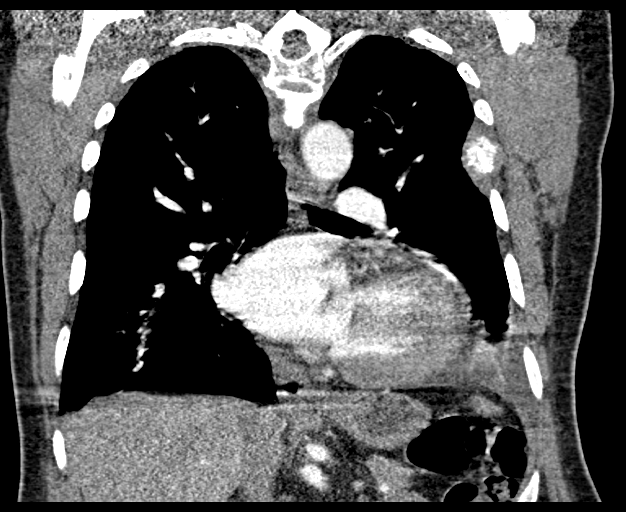

[Series 10: lung · axial · 0.63mm/px · z∈[+80,+242]mm · 4 of 135 slices shown]
[im 27/135  mediastinal]
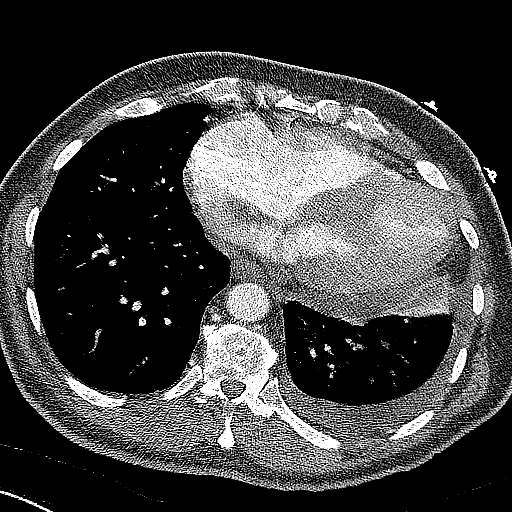
[im 54/135  mediastinal]
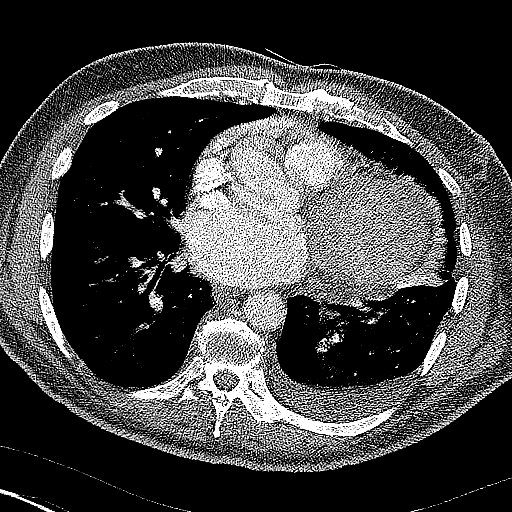
[im 81/135  mediastinal]
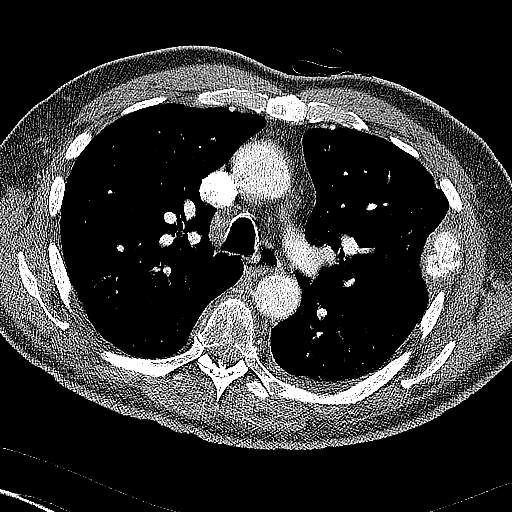
[im 108/135  mediastinal]
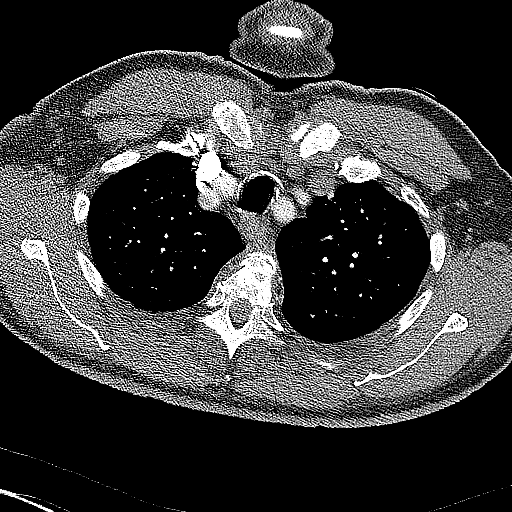

[17 of 36 positions shown; findings below may reference images not displayed]

RADIATION DOSE REDUCTION: This exam was performed according to the
departmental dose-optimization program which includes automated
exposure control, adjustment of the mA and/or kV according to
patient size and/or use of iterative reconstruction technique.

CONTRAST:  75mL OMNIPAQUE IOHEXOL 350 MG/ML SOLN
FINDINGS: Cardiovascular: Marked motion artifacts in the lower lung zones,
significantly worse than on the previous examination. This produces
simulation of multiple bilateral pulmonary artery filling defects
due to misregistration artifacts. No central or upper lobe pulmonary
arterial filling defects are seen.

The heart remains enlarged with a pericardial effusion again
demonstrated, measuring 1.4 cm in thickness. Atheromatous
calcifications, including the coronary arteries and aorta.

Mediastinum/Nodes: No enlarged mediastinal, hilar, or axillary lymph
nodes. Thyroid gland, trachea, and esophagus demonstrate no
significant findings.

Lungs/Pleura: Extensive motion artifacts in the lower lung zones. No
gross pulmonary nodules. The previously described 6 mm right middle
lobe nodule is no longer visualized. Small left pleural effusion.

Upper Abdomen: Enlarged lateral segment left lobe liver and caudate
lobe with a relatively small right lobe, similar to the previous
examination. Normal sized spleen.

Musculoskeletal: Extensive patchy, sclerotic bony lesions with
progression. Previously demonstrated multiple lytic areas are also
progressive. Interval multiple mild thoracic vertebral compression
deformities. Progressive expansile lytic and sclerotic 5th rib
lesion with a progressive pleural soft tissue component.

Review of the MIP images confirms the above findings.
IMPRESSION: 1. Marked breathing motion artifacts in the lower lung zones
essentially making it impossible to evaluate for lower zone
pulmonary emboli. No pulmonary emboli seen elsewhere in either lung.
2. Interval small left pleural effusion.
3. Significantly progressive bony metastatic disease with multiple
interval mild thoracic vertebral compression deformities.
4. Mild calcific coronary artery and aortic atherosclerosis.

Aortic Atherosclerosis ([FX]-[FX]).

## 2021-05-01 MED ORDER — ACETAMINOPHEN 650 MG RE SUPP: 650.0000 mg | Freq: Four times a day (QID) | RECTAL | Status: DC | PRN

## 2021-05-01 MED ORDER — IOHEXOL 350 MG/ML SOLN
100.0000 mL | Freq: Once | INTRAVENOUS | Status: AC | PRN
  Administered 2021-05-01: 75 mL via INTRAVENOUS

## 2021-05-01 MED ORDER — HYDROMORPHONE HCL 1 MG/ML IJ SOLN
1.0000 mg | Freq: Once | INTRAMUSCULAR | Status: AC
  Administered 2021-05-01: 1 mg via INTRAVENOUS
  Filled 2021-05-01: qty 1

## 2021-05-01 MED ORDER — OXYCODONE HCL ER 10 MG PO T12A
10.0000 mg | EXTENDED_RELEASE_TABLET | Freq: Two times a day (BID) | ORAL | Status: DC
  Administered 2021-05-01 – 2021-05-02 (×2): 10 mg via ORAL
  Filled 2021-05-01 (×2): qty 1

## 2021-05-01 MED ORDER — DIAZEPAM 5 MG PO TABS
5.0000 mg | ORAL_TABLET | Freq: Once | ORAL | Status: AC
  Administered 2021-05-01: 5 mg via ORAL
  Filled 2021-05-01: qty 1

## 2021-05-01 MED ORDER — METOPROLOL TARTRATE 25 MG PO TABS
25.0000 mg | ORAL_TABLET | Freq: Two times a day (BID) | ORAL | Status: DC
  Administered 2021-05-01 – 2021-05-30 (×58): 25 mg via ORAL
  Filled 2021-05-01 (×59): qty 1

## 2021-05-01 MED ORDER — KETOROLAC TROMETHAMINE 15 MG/ML IJ SOLN
15.0000 mg | Freq: Once | INTRAMUSCULAR | Status: AC
  Administered 2021-05-01: 15 mg via INTRAVENOUS
  Filled 2021-05-01: qty 1

## 2021-05-01 MED ORDER — LIDOCAINE 5 % EX PTCH
1.0000 | MEDICATED_PATCH | CUTANEOUS | Status: DC
  Administered 2021-05-01 – 2021-05-08 (×6): 1 via TRANSDERMAL
  Filled 2021-05-01 (×9): qty 1

## 2021-05-01 MED ORDER — ACETAMINOPHEN 325 MG PO TABS: 650.0000 mg | ORAL_TABLET | Freq: Four times a day (QID) | ORAL | Status: DC | PRN

## 2021-05-01 MED ORDER — HYDROMORPHONE HCL 1 MG/ML IJ SOLN: 0.5000 mg | Freq: Once | INTRAMUSCULAR | Status: DC

## 2021-05-01 MED ORDER — SODIUM CHLORIDE (PF) 0.9 % IJ SOLN
INTRAMUSCULAR | Status: AC
  Filled 2021-05-01: qty 50

## 2021-05-01 MED ORDER — KETOROLAC TROMETHAMINE 15 MG/ML IJ SOLN: 15.0000 mg | Freq: Once | INTRAMUSCULAR | Status: DC

## 2021-05-01 MED ORDER — ACETAMINOPHEN 500 MG PO TABS
1000.0000 mg | ORAL_TABLET | Freq: Once | ORAL | Status: AC
  Administered 2021-05-01: 1000 mg via ORAL
  Filled 2021-05-01: qty 2

## 2021-05-01 MED ORDER — POLYETHYLENE GLYCOL 3350 17 G PO PACK
17.0000 g | PACK | Freq: Two times a day (BID) | ORAL | Status: DC
  Administered 2021-05-03 – 2021-05-22 (×20): 17 g via ORAL
  Filled 2021-05-01 (×35): qty 1

## 2021-05-01 MED ORDER — KETAMINE HCL 50 MG/5ML IJ SOSY
10.0000 mg | PREFILLED_SYRINGE | Freq: Once | INTRAMUSCULAR | Status: AC
  Administered 2021-05-01: 10 mg via INTRAVENOUS
  Filled 2021-05-01 (×2): qty 5

## 2021-05-01 MED ORDER — HYDROMORPHONE HCL 1 MG/ML IJ SOLN
1.0000 mg | INTRAMUSCULAR | Status: DC | PRN
Start: 2021-05-01 — End: 2021-05-02
  Administered 2021-05-01 – 2021-05-02 (×4): 1 mg via INTRAVENOUS
  Filled 2021-05-01 (×4): qty 1

## 2021-05-01 MED ORDER — DOCUSATE SODIUM 100 MG PO CAPS
100.0000 mg | ORAL_CAPSULE | Freq: Two times a day (BID) | ORAL | Status: DC
  Administered 2021-05-02 – 2021-05-30 (×28): 100 mg via ORAL
  Filled 2021-05-01 (×45): qty 1

## 2021-05-01 MED ORDER — ONDANSETRON HCL 4 MG/2ML IJ SOLN
4.0000 mg | Freq: Once | INTRAMUSCULAR | Status: AC
  Administered 2021-05-01: 4 mg via INTRAVENOUS
  Filled 2021-05-01: qty 2

## 2021-05-01 MED ORDER — OXYCODONE HCL 5 MG PO TABS
5.0000 mg | ORAL_TABLET | ORAL | Status: DC | PRN
  Administered 2021-05-02 (×3): 10 mg via ORAL
  Filled 2021-05-01 (×3): qty 2

## 2021-05-01 MED ORDER — SODIUM CHLORIDE 0.9 % IV SOLN
75.0000 mL/h | INTRAVENOUS | Status: AC
  Administered 2021-05-01: 75 mL/h via INTRAVENOUS

## 2021-05-01 MED ORDER — HYDROMORPHONE HCL 2 MG PO TABS
2.0000 mg | ORAL_TABLET | Freq: Once | ORAL | Status: AC
Start: 2021-05-01 — End: 2021-05-01
  Administered 2021-05-01: 2 mg via ORAL
  Filled 2021-05-01: qty 1

## 2021-05-01 NOTE — ED Triage Notes (Signed)
Pt here from home with c/o chest pain while trying to place a lidocaine patch on his back , pt has known CA with lesions to his bones , pt took his oxycodone before he left his house

## 2021-05-01 NOTE — Assessment & Plan Note (Signed)
Most likely secondary to bony lesions.  Musculoskeletal.  CTA negative for PE.  Troponin flat.  No ischemic changes on EKG continue to monitor.

## 2021-05-01 NOTE — ED Notes (Signed)
Patient is on the floor on his knees but did not fall. Pt has been asked to please get in his bed or sit in the chair, but pt is not being cooperative. Pt keeps shouting for a doctor and for help. I have tried to help pt but he just keeps yelling and sounds aggressive. I asked pt to let me update vitals, but pt just keeps saying "please help me".

## 2021-05-01 NOTE — ED Triage Notes (Addendum)
Pt arrived via wheelchair, from cancer center. Pt seen in ED discharged and told to go to cancer center. Rapid response called when pt arrived in lobby. Chest pain, difficulty breathing. Sent to ED

## 2021-05-01 NOTE — ED Provider Triage Note (Signed)
Emergency Medicine Provider Triage Evaluation Note  Randall Larsen. , a 66 y.o. male  was evaluated in triage.  Pt complains of chest pain/back pain.  He in the ED earlier today for same however patient stated that it was related to previous pain . There was concern for potential for PE given history of cancer.  Patient declined blood work.  He was discharged and advised to follow-up with the cancer center.  He was in the lobby of the cancer center when a rapid response was called due to patient being in distress.  He had an EKG done and brought back to the ED. Pt is amenable to labs at this time.   Review of Systems  Positive: + chest pain, back pain, SOB Negative:   Physical Exam  BP (!) 162/83 (BP Location: Left Arm)    Pulse 81    Temp 97.6 F (36.4 C) (Oral)    Resp (!) 36    SpO2 99%  Gen:   Awake, no distress   Resp:  Normal effort  MSK:   Moves extremities without difficulty  Other:    Medical Decision Making  Medically screening exam initiated at 1:27 PM.  Appropriate orders placed.  Randall Larsen. was informed that the remainder of the evaluation will be completed by another provider, this initial triage assessment does not replace that evaluation, and the importance of remaining in the ED until their evaluation is complete.     Eustaquio Maize, PA-C 05/01/21 1327

## 2021-05-01 NOTE — ED Notes (Signed)
Pt has removed all cardiac monitoring leads and is sitting on his knees, in ED exam chair, in ED exam room doorway, yelling into hallway, "Please ! Please somebody help me! Darnelle Maffucci please! Oh God I cant stand it! I can't sit on the bed, I can't stand. Nobody understands the pain I'm in! Help me!" This nurse explained to the pt that he needs to stay in the ED stretcher to avoid falling. Pt has been counseled multiple times to please stay in ED stretcher and to stop yelling out into hallway. Pt's primary nurse, Darnelle Maffucci, now at the bedside with ordered pain medication.

## 2021-05-01 NOTE — ED Provider Notes (Signed)
Signout from Dr. Tyrone Nine.  66 year old male with history of prostate cancer with mets to bone.  Here with severe chest pain after twisting.  Plan is to follow-up on results of CT angio chest.  May need admission for pain control. Physical Exam  BP (!) 165/107 (BP Location: Left Arm)    Pulse 93    Temp 97.6 F (36.4 C) (Oral)    Resp (!) 24    SpO2 99%   Physical Exam  Procedures  Procedures  ED Course / MDM    Medical Decision Making Amount and/or Complexity of Data Reviewed Radiology: ordered.  Risk Prescription drug management. Decision regarding hospitalization.   CT PE does not show any acute PE.  Does have significant skeletal mets.  Does have compression fractures.  Patient still having intractable pain.  Asking for another dose of Dilaudid.  Reviewed his work-up with him and he does not feel he can manage this at home.  This time I do not feel I can adequately control the patient's pain with oral pain medication so will need admission to the hospital.  No evidence of cardiac ischemia at this time.  Discussed with [facility] hospitalist Dr. Roel Cluck regarding admission.  She will evaluate the patient.       Hayden Rasmussen, MD 05/02/21 [phone]

## 2021-05-01 NOTE — ED Notes (Signed)
Pt standing in room leaning over a chair, pt moaning and states that he can't sit down because of the pain, states that if he lays on his L side his R side hurts, if he lays on his back his chest hurts. pt requests pain med, md notified, pain med given.

## 2021-05-01 NOTE — ED Notes (Signed)
Pt sitting on the end of the bed, pt continues to c/o chest pain, pain med given, pt has removed monitor leads, leads replaced, pt states that he can't sit in bed, offered chair or to adjust bed, pt refused, pt states that it is very had to get comfortable. Pt requests something to drink

## 2021-05-01 NOTE — ED Notes (Addendum)
Pt given ginger ale and crackers and [place] sandwich

## 2021-05-01 NOTE — ED Notes (Signed)
Re-attempted to update vitals and pt is being uncooperative and asking for more pain medicine.

## 2021-05-01 NOTE — Progress Notes (Signed)
Received a message patient is in the lobby to be seen by myself. He was evaluated in the ED here at [facility] this morning for complaints of chest pain after placing his Lidoderm patch on. Received message he left and went [place] subway and said he would be right back.   Upon return he was in the lobby sitting on the edge of the chair, appeared pale, Rapid Response was called due to patient expressing significant chest pain and some dizziness. I assessed patient in the lobby inquiring about emergency room visit and work-up. He was focused on coming over to the [place] to be seen by myself expressing he had "trust" issues. I advised Mr. Zerkle he would need to return back to the ED for further evaluation given he continues to guard his left chest with his hand and state he is having pain. He does have known neoplasm related pain however his chest pain seems to be coming and going. Reports some dizziness and perspiration when he is at home and pain escalates with movement. He is extremely unsteady requiring 3 person assist to get him into the wheelchair.   I again emphasized to him we are not able to work-up his chest pain here at the cancer center and although I am following-him for pain and symptom management my recommendation is further ED follow-up. Per notations he was declining certain care when offered earlier. Encouraged him to allow for recommended work-up by EDP.   No Rapid Response interventions required. Patient was escorted to the ED via [place], [place].   Elder Love, NP [facility]/Palliative Medicine

## 2021-05-01 NOTE — Assessment & Plan Note (Signed)
Allow permissive hypertension for today restart beta-blocker blood pressure allow

## 2021-05-01 NOTE — Discharge Instructions (Addendum)
Your chest x-ray did not show any obvious new concerning pathology.  Please return to the ED at any point you would like to have lab testing performed.  Please follow-up with your oncologist.

## 2021-05-01 NOTE — ED Notes (Signed)
Pt standing in doorway, pt c/o pain, md notified, pain med given.  Pt back into bed.

## 2021-05-01 NOTE — Telephone Encounter (Signed)
Randall Larsen called this morning to notify our team that he is in the [facility] ED. I told him that I had notified Randall Baton, NP and that she was also aware. He requested to come over to the cancer center to see [place]. I advised him to stay in the ED and let them work him up to make sure that he doesn't need to be admitted and that we could come see him over there. He was in agreeance with this plan. I told him we would come by and see him this afternoon. Understanding verbalized. All questions answered.

## 2021-05-01 NOTE — ED Provider Notes (Signed)
[facility] DEPT Provider Note   CSN: [id number] Arrival date & time: 05/01/21  0805     History  No chief complaint on file.   Christohper Dube. is a 66 y.o. male.  66 yo M with a chief complaints of chest discomfort.  Patient states he was trying to put his lidocaine patch on his back today and when he was twisting to do that he developed a severe pain to the anterior aspect of his chest.  Worse with movement palpation and going over bumps in the car.  He felt it was so severe that he called 911 and was brought by EMS.  Tells me that the bumps in the ambulance made it much worse.  He denies cough congestion or fever denies hemoptysis denies unilateral lower extremity edema.  Tells me that he would like some pain medicine and to go to the oncology office this morning to be seen.  The history is provided by the patient.  Illness Severity:  Severe Onset quality:  Sudden Duration:  1 hour Timing:  Constant Progression:  Worsening Chronicity:  New Associated symptoms: chest pain   Associated symptoms: no abdominal pain, no congestion, no diarrhea, no fever, no headaches, no myalgias, no rash, no shortness of breath and no vomiting       Home Medications Prior to Admission medications   Medication Sig Start Date End Date Taking? Authorizing Provider  amLODipine (NORVASC) 10 MG tablet Take 1 tablet (10 mg total) by mouth daily. 02/08/21   Carlisle Cater, PA-C  bicalutamide (CASODEX) 50 MG tablet Take 1 tablet (50 mg total) by mouth daily. 03/29/21   Orson Slick, MD  docusate sodium (COLACE) 100 MG capsule Take 1 capsule (100 mg total) by mouth every 12 (twelve) hours. 04/02/21   Drenda Freeze, MD  hydrALAZINE (APRESOLINE) 50 MG tablet Take 1 tablet (50 mg total) by mouth 3 (three) times daily. 02/08/21   Carlisle Cater, PA-C  lidocaine (LIDODERM) 5 % Place 1 patch onto the skin daily. Remove & Discard patch within 12 hours or as directed by MD  04/26/21   Pickenpack-Cousar, Carlena Sax, NP  metoprolol succinate (TOPROL-XL) 100 MG 24 hr tablet Take 1 tablet (100 mg total) by mouth daily. 02/08/21   Carlisle Cater, PA-C  Multiple Vitamins-Minerals (ONE-A-DAY MENS 50+) TABS Take 1 tablet by mouth daily.    [provider]  ondansetron (ZOFRAN) 4 MG tablet Take 1 tablet (4 mg total) by mouth every 8 (eight) hours as needed for nausea or vomiting. 04/26/21   Pickenpack-Cousar, Carlena Sax, NP  oxyCODONE (OXY IR/ROXICODONE) 5 MG immediate release tablet Take 1-2 tablets (5-10 mg total) by mouth every 4 (four) hours as needed for severe pain or breakthrough pain. 04/27/21   Pickenpack-Cousar, Carlena Sax, NP  oxyCODONE ER (XTAMPZA ER) 9 MG C12A Take 9 mg by mouth every 8 (eight) hours. 04/17/21   Pickenpack-Cousar, Carlena Sax, NP  polyethylene glycol (MIRALAX / GLYCOLAX) 17 g packet Take 17 g by mouth 2 (two) times daily. 04/02/21   Drenda Freeze, MD      Allergies    Patient has no known allergies.    Review of Systems   Review of Systems  Constitutional:  Negative for chills and fever.  HENT:  Negative for congestion and facial swelling.   Eyes:  Negative for discharge and visual disturbance.  Respiratory:  Negative for shortness of breath.   Cardiovascular:  Positive for chest pain. Negative  for palpitations.  Gastrointestinal:  Negative for abdominal pain, diarrhea and vomiting.  Musculoskeletal:  Positive for arthralgias. Negative for myalgias.  Skin:  Negative for color change and rash.  Neurological:  Negative for tremors, syncope and headaches.  Psychiatric/Behavioral:  Negative for confusion and dysphoric mood.    Physical Exam Updated Vital Signs BP (!) 147/85    Pulse 72    Temp 97.9 F (36.6 C) (Oral)    Resp (!) 25    SpO2 100%  Physical Exam Vitals and nursing note reviewed.  Constitutional:      Appearance: He is well-developed.  HENT:     Head: Normocephalic and atraumatic.  Eyes:     Pupils: Pupils are equal,  round, and reactive to light.  Neck:     Vascular: No JVD.  Cardiovascular:     Rate and Rhythm: Normal rate and regular rhythm.     Heart sounds: No murmur heard.   No friction rub. No gallop.  Pulmonary:     Effort: No respiratory distress.     Breath sounds: No wheezing.  Abdominal:     General: There is no distension.     Tenderness: There is no abdominal tenderness. There is no guarding or rebound.  Musculoskeletal:        General: Normal range of motion.     Cervical back: Normal range of motion and neck supple.  Skin:    Coloration: Skin is not pale.     Findings: No rash.  Neurological:     Mental Status: He is alert and oriented to person, place, and time.  Psychiatric:        Behavior: Behavior normal.    ED Results / Procedures / Treatments   Labs (all labs ordered are listed, but only abnormal results are displayed) Labs Reviewed - No data to display  EKG EKG Interpretation  Date/Time:  Monday May 01 2021 08:26:45 EST Ventricular Rate:  75 PR Interval:  185 QRS Duration: 105 QT Interval:  435 QTC Calculation: 486 R Axis:   -71 Text Interpretation: Sinus rhythm Probable left atrial enlargement Left anterior fascicular block Anterior infarct, old No significant change since last tracing Confirmed by Deno Etienne [phone]) on 05/01/2021 9:41:07 AM  Radiology DG Chest Port 1 View  Result Date: 05/01/2021 CLINICAL DATA:  Chest pain EXAM: PORTABLE CHEST 1 VIEW COMPARISON:  03/05/2021 FINDINGS: Small left pleural effusion with adjacent atelectasis. Similar cardiomegaly. No pneumothorax. Left pleural thickening laterally underlying rib lesions seen on CT. IMPRESSION: Small left pleural effusion with adjacent atelectasis. Electronically Signed   By: Macy Mis M.D.   On: 05/01/2021 10:45    Procedures Procedures    Medications Ordered in ED Medications  acetaminophen (TYLENOL) tablet 1,000 mg (1,000 mg Oral Given 05/01/21 0908)  diazepam (VALIUM) tablet 5 mg  (5 mg Oral Given 05/01/21 0908)  HYDROmorphone (DILAUDID) tablet 2 mg (2 mg Oral Given 05/01/21 0908)    ED Course/ Medical Decision Making/ A&P                           Medical Decision Making Amount and/or Complexity of Data Reviewed Radiology: ordered. ECG/medicine tests: ordered.  Risk OTC drugs. Prescription drug management.   66 yo M with a chief complaints of chest pain.  This started suddenly.  Sounds muscular by history.  Per EMS report sounds consistent with prior.  Unable to reproduce it on exam.  Tells me that he was rotated  severely to try and put a lidocaine patch on his back at the onset.  Was worse with bumps in the car.  He does have a history of prostate cancer making his risk for pulmonary embolism higher.  I discussed the laboratory evaluation to try and rule that out with a D-dimer patient is declining and tells me that he knows what this is and does not need any further work-up.  I was able to convince him to get a chest x-ray and EKG here.  We will give a dose of pain medicine.  The patient's medical record was reviewed he has recently seen palliative care for worsening pain after initiation of Lupron injections.  Currently on oxycodone tablets.  Chest x-ray viewed by me with a small left-sided pleural effusion.  Seems unlikely to be the cause of his symptoms.  Patient denies any significant improvement but would like to go to his oncologist office now.  10:56 AM:  I have discussed the diagnosis/risks/treatment options with the patient and believe the pt to be eligible for discharge home to follow-up with Oncology. We also discussed returning to the ED immediately if new or worsening sx occur. We discussed the sx which are most concerning (e.g., sudden worsening pain, fever, inability to tolerate by mouth, if he changes his mind and would like a lab eval) that necessitate immediate return. Medications administered to the patient during their visit and any new  prescriptions provided to the patient are listed below.  Medications given during this visit Medications  acetaminophen (TYLENOL) tablet 1,000 mg (1,000 mg Oral Given 05/01/21 0908)  diazepam (VALIUM) tablet 5 mg (5 mg Oral Given 05/01/21 0908)  HYDROmorphone (DILAUDID) tablet 2 mg (2 mg Oral Given 05/01/21 0908)     The patient appears reasonably screen and/or stabilized for discharge and I doubt any other medical condition or other [facility] requiring further screening, evaluation, or treatment in the ED at this time prior to discharge.          Final Clinical Impression(s) / ED Diagnoses Final diagnoses:  Chest pain in adult    Rx / DC Orders ED Discharge Orders     None         Deno Etienne, DO 05/01/21 1056

## 2021-05-01 NOTE — ED Notes (Signed)
Pt to ct 

## 2021-05-01 NOTE — H&P (Signed)
Randall Larsen. ENI:[id number] DOB: Apr 14, 1955 DOA: 05/01/2021     PCP: [name], MD   Outpatient Specialists: E    Oncology   Dr.Dorsey    Patient arrived to ER on 05/01/21 at 1305 Referred by Attending Toy Baker, MD   Patient coming from: home Lives alone,        Chief Complaint:   Chief Complaint  Patient presents with   Chest Pain    HPI: Randall Larsen. is a 66 y.o. 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  He has known prostate cancer with metastatic bone lesions and severe pain with for which he takes oxycodone Patient states that the pain was secondary to twisting but it became severe anterior side of his chest.  It seems to be worse if he was bumped in any way.  He also severe he called 911.  He has not had any cough or fever no hemoptysis no leg swelling. Patient initially was seen in the morning in the emergency department decline further work-up disorder and some pain medication and follow-up with oncologist He left emergency department and went to cancer center where rapid response had to be called in the lobby because of severe chest pain or shortness of breath patient was brought Back to emergency department where he was complaining about severe chest pain   Still smokes   Has  NOt been vaccinated against COVID    Initial COVID TEST  NEGATIVE   Lab Results  Component Value Date   [place] 05/01/2021     Regarding pertinent Chronic problems:       HTN on NOrvasc, hydralazine, torprol     Chronic anemia - baseline hg Hemoglobin & Hematocrit  Recent Labs    04/07/21 1025 04/17/21 1000 05/01/21 1355  HGB 12.1* 11.9* 11.9*     While in ER: Severe pain unable to controlled with IV Dilaudid. D.dimer 13     CXR - Small left pleural effusion     CTA  chest -  nonacute, no PE,  no evidence of infiltrate Extensive patchy, sclerotic bony lesions with progression. Previously demonstrated multiple lytic areas are also progressive. Interval multiple mild thoracic vertebral compression deformities. Progressive expansile lytic and sclerotic 5th rib lesion with a progressive pleural soft tissue component. Following Medications were ordered in ER: Medications  lidocaine (LIDODERM) 5 % 1 patch (1 patch Transdermal Patch Applied 05/01/21 1739)  sodium chloride (PF) 0.9 % injection (  Not Given 05/01/21 2046)  HYDROmorphone (DILAUDID) injection 1 mg (1 mg Intravenous Given 05/01/21 2047)  HYDROmorphone (DILAUDID) injection 1 mg (1 mg Intravenous Given 05/01/21 1429)  ondansetron (ZOFRAN) injection 4 mg (4 mg Intravenous Given 05/01/21 1428)  HYDROmorphone (DILAUDID) injection 1 mg (1 mg Intravenous Given 05/01/21 1500)  ketorolac (TORADOL) 15 MG/ML injection 15 mg (15 mg Intravenous Given 05/01/21 1459)  iohexol (OMNIPAQUE) 350 MG/ML injection 100 mL (75 mLs Intravenous Contrast Given 05/01/21 1805)  ketamine 50 mg in normal saline 5 mL (10 mg/mL) syringe (10 mg Intravenous Given 05/01/21 1614)  HYDROmorphone (DILAUDID) injection 1 mg (1 mg Intravenous Given 05/01/21 1710)  HYDROmorphone (DILAUDID) injection 1 mg (1 mg Intravenous Given 05/01/21 1859)     ED Triage Vitals  Enc Vitals Group     BP 05/01/21 1311 (!)  162/83     Pulse Rate 05/01/21 1311 81     Resp 05/01/21 1311 (!) 36     Temp 05/01/21 1311 97.6 F (36.4 C)     Temp Source 05/01/21 1311 Oral     SpO2 05/01/21 1311 99 %     Weight 05/01/21 2023 218 lb 7.6 oz (99.1 kg)     Height 05/01/21 2023 5\' 10"  (1.778 m)     Head Circumference --      Peak Flow --      Pain Score 05/01/21 1504 7     Pain Loc --      Pain Edu? --      Excl. in Winter? --   TMAX(24)@     _________________________________________ Significant initial  Findings: Abnormal Labs Reviewed  COMPREHENSIVE METABOLIC PANEL -  Abnormal; Notable for the following components:      Result Value   Glucose, Bld 133 (*)    Alkaline Phosphatase 440 (*)    All other components within normal limits  CBC WITH DIFFERENTIAL/PLATELET - Abnormal; Notable for the following components:   RBC 3.98 (*)    Hemoglobin 11.9 (*)    HCT 35.5 (*)    nRBC 2.8 (*)    Monocytes Absolute 1.1 (*)    Abs Immature Granulocytes 0.27 (*)    All other components within normal limits  D-DIMER, QUANTITATIVE - Abnormal; Notable for the following components:   D-Dimer, Quant 13.00 (*)    All other components within normal limits  TROPONIN I (HIGH SENSITIVITY) - Abnormal; Notable for the following components:   Troponin I (High Sensitivity) 27 (*)    All other components within normal limits  TROPONIN I (HIGH SENSITIVITY) - Abnormal; Notable for the following components:   Troponin I (High Sensitivity) 27 (*)    All other components within normal limits    _________________________ Troponin  27 27 ECG: Ordered Personally reviewed by me showing: HR : 75 Rhythm:  NSR,    no evidence of ischemic changes QTC 486     The recent clinical data is shown below. Vitals:   05/01/21 1724 05/01/21 1839 05/01/21 1902 05/01/21 2023  BP: (!) 164/74 (!) 179/107 (!) 171/81   Pulse: 87 98 91   Resp: 20 (!) 22 (!) 22   Temp:   97.7 F (36.5 C)   TempSrc:   Oral   SpO2: 100% 94% 100%   Weight:    99.1 kg  Height:    5\' 10"  (1.778 m)    WBC     Component Value Date/Time   WBC 10.2 05/01/2021 1355   LYMPHSABS 3.0 05/01/2021 1355   MONOABS 1.1 (H) 05/01/2021 1355   EOSABS 0.1 05/01/2021 1355   BASOSABS 0.1 05/01/2021 1355      Results for orders placed or performed during the hospital encounter of 05/01/21  Resp Panel by RT-PCR (Flu A&B, Covid) Nasopharyngeal Swab     Status: None   Collection Time: 05/01/21  7:02 PM   Specimen: Nasopharyngeal Swab; Nasopharyngeal(NP) swabs in vial transport medium  Result Value Ref Range Status   SARS  Coronavirus 2 by RT PCR NEGATIVE NEGATIVE Final         Influenza A by PCR NEGATIVE NEGATIVE Final   Influenza B by PCR NEGATIVE NEGATIVE Final           _______________________________________________ Hospitalist was called for admission for chest pain   The following Work up has been ordered so far:  Orders Placed  This Encounter  Procedures   Resp Panel by RT-PCR (Flu A&B, Covid) Nasopharyngeal Swab   CT Angio Chest PE W and/or Wo Contrast   Comprehensive metabolic panel   CBC with Differential   D-dimer, quantitative   Cardiac monitoring   Consult to hospitalist   ED EKG   Place in observation (patient's expected length of stay will be less than 2 midnights)     OTHER Significant initial  Findings:  labs showing:    Recent Labs  Lab 05/01/21 1355  NA 140  K 3.6  CO2 24  GLUCOSE 133*  BUN 14  CREATININE 0.99  CALCIUM 9.3    Cr    stable,   Lab Results  Component Value Date   CREATININE 0.99 05/01/2021   CREATININE 1.15 04/17/2021   CREATININE 1.40 (H) 04/07/2021    Recent Labs  Lab 05/01/21 1355  AST 29  ALT 14  ALKPHOS 440*  BILITOT 0.5  PROT 7.6  ALBUMIN 4.1   Lab Results  Component Value Date   CALCIUM 9.3 05/01/2021        Plt: Lab Results  Component Value Date   PLT 254 05/01/2021      COVID-19 Labs  Recent Labs    05/01/21 1355  DDIMER 13.00*    Lab Results  Component Value Date   SARSCOV2NAA NEGATIVE 05/01/2021        Recent Labs  Lab 05/01/21 1355  WBC 10.2  NEUTROABS 5.8  HGB 11.9*  HCT 35.5*  MCV 89.2  PLT 254    HG/HCT  stable,       Component Value Date/Time   HGB 11.9 (L) 05/01/2021 1355   HGB 11.9 (L) 04/17/2021 1000   HCT 35.5 (L) 05/01/2021 1355   MCV 89.2 05/01/2021 1355       Cardiac Panel (last 3 results) No results for input(s): CKTOTAL, CKMB, TROPONINI, RELINDX in the last 72 hours.  .car BNP (last 3 results) Recent Labs    02/08/21 0730  BNP 61.9      Cultures: No results found  for: SDES, SPECREQUEST, CULT, REPTSTATUS   Radiological Exams on Admission: CT Angio Chest PE W and/or Wo Contrast  Result Date: 05/01/2021 CLINICAL DATA:  Chest pain and elevated D-dimer. Prostate cancer with bone metastases. EXAM: CT ANGIOGRAPHY CHEST WITH CONTRAST TECHNIQUE: Multidetector CT imaging of the chest was performed using the standard protocol during bolus administration of intravenous contrast. Multiplanar CT image reconstructions and MIPs were obtained to evaluate the vascular anatomy. RADIATION DOSE REDUCTION: This exam was performed according to the departmental dose-optimization program which includes automated exposure control, adjustment of the mA and/or kV according to patient size and/or use of iterative reconstruction technique. CONTRAST:  61mL OMNIPAQUE IOHEXOL 350 MG/ML SOLN COMPARISON:  02/08/2021 FINDINGS: Cardiovascular: Marked motion artifacts in the lower lung zones, significantly worse than on the previous examination. This produces simulation of multiple bilateral pulmonary artery filling defects due to misregistration artifacts. No central or upper lobe pulmonary arterial filling defects are seen. The heart remains enlarged with a pericardial effusion again demonstrated, measuring 1.4 cm in thickness. Atheromatous calcifications, including the coronary arteries and aorta. Mediastinum/Nodes: No enlarged mediastinal, hilar, or axillary lymph nodes. Thyroid gland, trachea, and esophagus demonstrate no significant findings. Lungs/Pleura: Extensive motion artifacts in the lower lung zones. No gross pulmonary nodules. The previously described 6 mm right middle lobe nodule is no longer visualized. Small left pleural effusion. Upper Abdomen: Enlarged lateral segment left lobe liver and caudate lobe with  a relatively small right lobe, similar to the previous examination. Normal sized spleen. Musculoskeletal: Extensive patchy, sclerotic bony lesions with progression. Previously  demonstrated multiple lytic areas are also progressive. Interval multiple mild thoracic vertebral compression deformities. Progressive expansile lytic and sclerotic 5th rib lesion with a progressive pleural soft tissue component. Review of the MIP images confirms the above findings. IMPRESSION: 1. Marked breathing motion artifacts in the lower lung zones essentially making it impossible to evaluate for lower zone pulmonary emboli. No pulmonary emboli seen elsewhere in either lung. 2. Interval small left pleural effusion. 3. Significantly progressive bony metastatic disease with multiple interval mild thoracic vertebral compression deformities. 4. Mild calcific coronary artery and aortic atherosclerosis. Aortic Atherosclerosis (ICD10-I70.0). Electronically Signed   By: Claudie Revering M.D.   On: 05/01/2021 18:38   DG Chest Port 1 View  Result Date: 05/01/2021 CLINICAL DATA:  Chest pain EXAM: PORTABLE CHEST 1 VIEW COMPARISON:  03/05/2021 FINDINGS: Small left pleural effusion with adjacent atelectasis. Similar cardiomegaly. No pneumothorax. Left pleural thickening laterally underlying rib lesions seen on CT. IMPRESSION: Small left pleural effusion with adjacent atelectasis. Electronically Signed   By: Macy Mis M.D.   On: 05/01/2021 10:45   _______________________________________________________________________________________________________ Latest   Blood pressure (!) 171/81, pulse 91, temperature 97.7 F (36.5 C), temperature source Oral, resp. rate (!) 22, height 5\' 10"  (1.778 m), weight 99.1 kg, SpO2 100 %.   Vitals  labs and radiology finding personally reviewed  Review of Systems:    Pertinent positives include:  chest pain,  Constitutional:  No weight loss, night sweats, Fevers, chills, fatigue, weight loss  HEENT:  No headaches, Difficulty swallowing,Tooth/dental problems,Sore throat,  No sneezing, itching, ear ache, nasal congestion, post nasal drip,  Cardio-vascular:  No  Orthopnea,  PND, anasarca, dizziness, palpitations.no Bilateral lower extremity swelling  GI:  No heartburn, indigestion, abdominal pain, nausea, vomiting, diarrhea, change in bowel habits, loss of appetite, melena, blood in stool, hematemesis Resp:  no shortness of breath at rest. No dyspnea on exertion, No excess mucus, no productive cough, No non-productive cough, No coughing up of blood.No change in color of mucus.No wheezing. Skin:  no rash or lesions. No jaundice GU:  no dysuria, change in color of urine, no urgency or frequency. No straining to urinate.  No flank pain.  Musculoskeletal:  No joint pain or no joint swelling. No decreased range of motion. No back pain.  Psych:  No change in mood or affect. No depression or anxiety. No memory loss.  Neuro: no localizing neurological complaints, no tingling, no weakness, no double vision, no gait abnormality, no slurred speech, no confusion  All systems reviewed and apart from [place] all are negative _______________________________________________________________________________________________ Past Medical History:   Past Medical History:  Diagnosis Date   Hypertension    Prostate cancer ([place])       History reviewed. No pertinent surgical history.  Social History:  Ambulatory   independently      reports that he has been smoking cigarettes. He has a 25.00 pack-year smoking history. He has never used smokeless tobacco. He reports that he does not currently use alcohol. 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     Family History: History of hypertension History reviewed. No pertinent family history. ______________________________________________________________________________________________ Allergies: No Known Allergies   Prior to Admission medications   Medication Sig Start Date End Date Taking? Authorizing Provider  lidocaine (LIDODERM) 5 % Place 1 patch onto the skin daily.  Remove & Discard patch within 12 hours or as directed by MD 04/26/21  Yes Pickenpack-Cousar, Carlena Sax, NP  metoprolol succinate (TOPROL-XL) 100 MG 24 hr tablet Take 1 tablet (100 mg total) by mouth daily. 02/08/21  Yes Carlisle Cater, PA-C  amLODipine (NORVASC) 10 MG tablet Take 1 tablet (10 mg total) by mouth daily. 02/08/21   Carlisle Cater, PA-C  bicalutamide (CASODEX) 50 MG tablet Take 1 tablet (50 mg total) by mouth daily. Patient not taking: Reported on 05/01/2021 03/29/21   Orson Slick, MD  docusate sodium (COLACE) 100 MG capsule Take 1 capsule (100 mg total) by mouth every 12 (twelve) hours. 04/02/21   Drenda Freeze, MD  hydrALAZINE (APRESOLINE) 50 MG tablet Take 1 tablet (50 mg total) by mouth 3 (three) times daily. 02/08/21   Carlisle Cater, PA-C  Multiple Vitamins-Minerals (ONE-A-DAY MENS 50+) TABS Take 1 tablet by mouth daily.    [provider]  ondansetron (ZOFRAN) 4 MG tablet Take 1 tablet (4 mg total) by mouth every 8 (eight) hours as needed for nausea or vomiting. 04/26/21   Pickenpack-Cousar, Carlena Sax, NP  oxyCODONE (OXY IR/ROXICODONE) 5 MG immediate release tablet Take 1-2 tablets (5-10 mg total) by mouth every 4 (four) hours as needed for severe pain or breakthrough pain. 04/27/21   Pickenpack-Cousar, Carlena Sax, NP  oxyCODONE ER (XTAMPZA ER) 9 MG C12A Take 9 mg by mouth every 8 (eight) hours. 04/17/21   Pickenpack-Cousar, Carlena Sax, NP  polyethylene glycol (MIRALAX / GLYCOLAX) 17 g packet Take 17 g by mouth 2 (two) times daily. 04/02/21   Drenda Freeze, MD    ___________________________________________________________________________________________________ Physical Exam: Vitals with BMI 05/01/2021 05/01/2021 05/01/2021  Height 5\' 10"  - -  Weight 218 lbs 8 oz - -  BMI 53.66 - -  Systolic - 440 347  Diastolic - 81 425  Pulse - 91 98     1. General:  in  Acute distress complaining of severe pain   Chronically ill   -appearing 2. Psychological: Alert and    Oriented 3. Head/ENT:   Dry Mucous Membranes                          Head Non traumatic, neck supple                           Poor Dentition 4. SKIN: decreased Skin turgor,  Skin clean Dry and intact no rash 5. Heart: Regular rate and rhythm no  Murmur, no Rub or gallop 6. Lungs:  Clear to auscultation bilaterally, no wheezes or crackles   7. Abdomen: Soft,  non-tender, Non distended  bowel sounds present 8. Lower extremities: no clubbing, cyanosis, no  edema 9. Neurologically Grossly intact, moving all 4 extremities equally   10. MSK: Normal range of motion    Chart has been reviewed  ______________________________________________________________________________________________  Assessment/Plan  66 y.o. 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  Try to apply lidocaine patches.  Palliative care consult notify oncology patient was admitted  Cancer associated pain Supportive pain management  Chest pain Most likely secondary to bony lesions.  Musculoskeletal.  CTA negative for PE.  Troponin  flat.  No ischemic changes on EKG continue to monitor.  Essential hypertension Allow permissive hypertension for today restart beta-blocker blood pressure allow    Other plan as per orders.  DVT prophylaxis:  SCD      Code Status:    Code Status: Not on file FULL CODE  as per patient   I had personally discussed CODE STATUS with patient    Family Communication:   Family not at  Bedside    Disposition Plan:     To home once workup is complete and patient is stable   Following barriers for discharge:                                                        Pain controlled with PO medications                                                              Will need to be able to tolerate PO                                                        Will need consultants to evaluate patient prior to discharge                        Would benefit from PT/OT eval prior to DC  Ordered                                      Palliative care    consulted                  Consults called: email oncology   Admission status:  ED Disposition     ED Disposition  Admit   Condition  --   [place]: [place] [[id number]]  Level of Care: Progressive [102]  Admit to Progressive based on following criteria: CARDIOVASCULAR & THORACIC of moderate stability with acute coronary syndrome symptoms/low risk myocardial infarction/hypertensive urgency/arrhythmias/heart failure potentially compromising stability and stable post cardiovascular intervention patients.  May place patient in observation at [facility] or [place] if equivalent level of care is available:: No  Covid Evaluation: Asymptomatic Screening Protocol (No Symptoms)  Diagnosis: Cancer associated pain [[id number]]  Admitting Physician: Toy Baker [[id number]]  Attending Physician: Toy Baker [[id number]]           Obs     Level of care     progressive tele indefinitely please discontinue once patient no longer qualifies COVID-19 Labs    Lab Results  Component Value Date   [place] 05/01/2021     Precautions: admitted as   Covid Negative      Zanden Colver 05/02/2021, 2:01 AM    [facility]     after 2 AM please page floor coverage PA If 7AM-7PM, please contact  the day team taking care of the patient using [email or web address]   Patient was evaluated in the context of the global COVID-19 pandemic, which necessitated consideration that the patient might be at risk for infection with the SARS-CoV-2 virus that causes COVID-19. Institutional protocols and algorithms that pertain to the evaluation of patients at risk for COVID-19  are in a state of rapid change based on information released by regulatory bodies including the CDC and federal and state organizations. These policies and algorithms were followed during the patient's care.

## 2021-05-01 NOTE — Subjective & Objective (Signed)
Presented with chest pain started when he was trying to put a lidocaine patch on his back.  He has known prostate cancer with metastatic bone lesions and severe pain with for which he takes oxycodone Patient states that the pain was secondary to twisting but it became severe anterior side of his chest.  It seems to be worse if he was bumped in any way.  He also severe he called 911.  He has not had any cough or fever no hemoptysis no leg swelling. Patient initially was seen in the morning in the emergency department decline further work-up disorder and some pain medication and follow-up with oncologist He left emergency department and went to cancer center where rapid response had to be called in the lobby because of severe chest pain or shortness of breath patient was brought Back to emergency department where he was complaining about severe chest pain

## 2021-05-01 NOTE — Assessment & Plan Note (Addendum)
Supportive pain management

## 2021-05-01 NOTE — Assessment & Plan Note (Signed)
Pain control required IV pain medication.  Try to apply lidocaine patches.  Palliative care consult notify oncology patient was admitted

## 2021-05-02 ENCOUNTER — Ambulatory Visit
Admit: 2021-05-02 | Discharge: 2021-05-02 | Disposition: A | Discharge status: Home / Self Care | Payer: Medicare HMO | Attending: Radiation Oncology | Admitting: Radiation Oncology

## 2021-05-02 ENCOUNTER — Observation Stay (HOSPITAL_BASED_OUTPATIENT_CLINIC_OR_DEPARTMENT_OTHER): Payer: Medicare HMO

## 2021-05-02 DIAGNOSIS — Z515 Encounter for palliative care: Secondary | ICD-10-CM

## 2021-05-02 DIAGNOSIS — I10 Essential (primary) hypertension: Secondary | ICD-10-CM | POA: Diagnosis present

## 2021-05-02 DIAGNOSIS — F419 Anxiety disorder, unspecified: Secondary | ICD-10-CM | POA: Diagnosis not present

## 2021-05-02 DIAGNOSIS — F17210 Nicotine dependence, cigarettes, uncomplicated: Secondary | ICD-10-CM | POA: Diagnosis present

## 2021-05-02 DIAGNOSIS — R7989 Other specified abnormal findings of blood chemistry: Secondary | ICD-10-CM | POA: Diagnosis not present

## 2021-05-02 DIAGNOSIS — M899 Disorder of bone, unspecified: Secondary | ICD-10-CM | POA: Diagnosis not present

## 2021-05-02 DIAGNOSIS — K30 Functional dyspepsia: Secondary | ICD-10-CM | POA: Diagnosis not present

## 2021-05-02 DIAGNOSIS — Z28310 Unvaccinated for covid-19: Secondary | ICD-10-CM | POA: Diagnosis not present

## 2021-05-02 DIAGNOSIS — Z8546 Personal history of malignant neoplasm of prostate: Secondary | ICD-10-CM | POA: Diagnosis not present

## 2021-05-02 DIAGNOSIS — C61 Malignant neoplasm of prostate: Secondary | ICD-10-CM

## 2021-05-02 DIAGNOSIS — R339 Retention of urine, unspecified: Secondary | ICD-10-CM | POA: Diagnosis not present

## 2021-05-02 DIAGNOSIS — G839 Paralytic syndrome, unspecified: Secondary | ICD-10-CM | POA: Diagnosis present

## 2021-05-02 DIAGNOSIS — Z8249 Family history of ischemic heart disease and other diseases of the circulatory system: Secondary | ICD-10-CM | POA: Diagnosis not present

## 2021-05-02 DIAGNOSIS — M4804 Spinal stenosis, thoracic region: Secondary | ICD-10-CM | POA: Diagnosis present

## 2021-05-02 DIAGNOSIS — C7951 Secondary malignant neoplasm of bone: Secondary | ICD-10-CM

## 2021-05-02 DIAGNOSIS — R609 Edema, unspecified: Secondary | ICD-10-CM | POA: Diagnosis not present

## 2021-05-02 DIAGNOSIS — G893 Neoplasm related pain (acute) (chronic): Secondary | ICD-10-CM | POA: Diagnosis present

## 2021-05-02 DIAGNOSIS — R0902 Hypoxemia: Secondary | ICD-10-CM | POA: Diagnosis not present

## 2021-05-02 DIAGNOSIS — Z79899 Other long term (current) drug therapy: Secondary | ICD-10-CM | POA: Diagnosis not present

## 2021-05-02 DIAGNOSIS — R52 Pain, unspecified: Secondary | ICD-10-CM

## 2021-05-02 DIAGNOSIS — Z6831 Body mass index (BMI) 31.0-31.9, adult: Secondary | ICD-10-CM | POA: Diagnosis not present

## 2021-05-02 DIAGNOSIS — R600 Localized edema: Secondary | ICD-10-CM | POA: Diagnosis not present

## 2021-05-02 DIAGNOSIS — E669 Obesity, unspecified: Secondary | ICD-10-CM | POA: Diagnosis present

## 2021-05-02 DIAGNOSIS — M8458XA Pathological fracture in neoplastic disease, other specified site, initial encounter for fracture: Secondary | ICD-10-CM | POA: Diagnosis present

## 2021-05-02 DIAGNOSIS — M48061 Spinal stenosis, lumbar region without neurogenic claudication: Secondary | ICD-10-CM | POA: Diagnosis present

## 2021-05-02 DIAGNOSIS — D649 Anemia, unspecified: Secondary | ICD-10-CM | POA: Diagnosis present

## 2021-05-02 DIAGNOSIS — G9529 Other cord compression: Secondary | ICD-10-CM | POA: Diagnosis present

## 2021-05-02 DIAGNOSIS — J90 Pleural effusion, not elsewhere classified: Secondary | ICD-10-CM | POA: Diagnosis present

## 2021-05-02 DIAGNOSIS — R079 Chest pain, unspecified: Secondary | ICD-10-CM | POA: Diagnosis not present

## 2021-05-02 DIAGNOSIS — Z20822 Contact with and (suspected) exposure to covid-19: Secondary | ICD-10-CM | POA: Diagnosis present

## 2021-05-02 DIAGNOSIS — K5900 Constipation, unspecified: Secondary | ICD-10-CM | POA: Diagnosis not present

## 2021-05-02 LAB — CBC WITH DIFFERENTIAL/PLATELET
Abs Immature Granulocytes: 0.42 10*3/uL — ABNORMAL HIGH (ref 0.00–0.07)
Basophils Absolute: 0.1 10*3/uL (ref 0.0–0.1)
Basophils Relative: 1 %
Eosinophils Absolute: 0 10*3/uL (ref 0.0–0.5)
Eosinophils Relative: 0 %
HCT: 33.4 % — ABNORMAL LOW (ref 39.0–52.0)
Hemoglobin: 11.5 g/dL — ABNORMAL LOW (ref 13.0–17.0)
Immature Granulocytes: 4 %
Lymphocytes Relative: 27 %
Lymphs Abs: 3 10*3/uL (ref 0.7–4.0)
MCH: 30.7 pg (ref 26.0–34.0)
MCHC: 34.4 g/dL (ref 30.0–36.0)
MCV: 89.3 fL (ref 80.0–100.0)
Monocytes Absolute: 1 10*3/uL (ref 0.1–1.0)
Monocytes Relative: 9 %
Neutro Abs: 6.8 10*3/uL (ref 1.7–7.7)
Neutrophils Relative %: 59 %
Platelets: 243 10*3/uL (ref 150–400)
RBC: 3.74 MIL/uL — ABNORMAL LOW (ref 4.22–5.81)
RDW: 15.5 % (ref 11.5–15.5)
WBC: 11.4 10*3/uL — ABNORMAL HIGH (ref 4.0–10.5)
nRBC: 2.1 % — ABNORMAL HIGH (ref 0.0–0.2)

## 2021-05-02 LAB — COMPREHENSIVE METABOLIC PANEL
ALT: 15 U/L (ref 0–44)
AST: 50 U/L — ABNORMAL HIGH (ref 15–41)
Albumin: 4.1 g/dL (ref 3.5–5.0)
Alkaline Phosphatase: 470 U/L — ABNORMAL HIGH (ref 38–126)
Anion gap: 9 (ref 5–15)
BUN: 10 mg/dL (ref 8–23)
CO2: 23 mmol/L (ref 22–32)
Calcium: 9.6 mg/dL (ref 8.9–10.3)
Chloride: 105 mmol/L (ref 98–111)
Creatinine, Ser: 0.96 mg/dL (ref 0.61–1.24)
GFR, Estimated: >60 mL/min (ref >60)
Glucose, Bld: 130 mg/dL — ABNORMAL HIGH (ref 70–99)
Potassium: 3.3 mmol/L — ABNORMAL LOW (ref 3.5–5.1)
Sodium: 137 mmol/L (ref 135–145)
Total Bilirubin: 1.1 mg/dL (ref 0.3–1.2)
Total Protein: 7.6 g/dL (ref 6.5–8.1)

## 2021-05-02 LAB — IRON AND TIBC
Iron: 100 ug/dL (ref 45–182)
Saturation Ratios: 39 % (ref 17.9–39.5)
TIBC: 255 ug/dL (ref 250–450)
UIBC: 155 ug/dL

## 2021-05-02 LAB — TSH: TSH: 2.69 u[IU]/mL (ref 0.350–4.500)

## 2021-05-02 LAB — FERRITIN: Ferritin: 865 ng/mL — ABNORMAL HIGH (ref 24–336)

## 2021-05-02 LAB — HIV ANTIBODY (ROUTINE TESTING W REFLEX): HIV Screen 4th Generation wRfx: NONREACTIVE

## 2021-05-02 LAB — RETICULOCYTES
Immature Retic Fract: 31.9 % — ABNORMAL HIGH (ref 2.3–15.9)
RBC.: 3.76 MIL/uL — ABNORMAL LOW (ref 4.22–5.81)
Retic Count, Absolute: 92.1 10*3/uL (ref 19.0–186.0)
Retic Ct Pct: 2.5 % (ref 0.4–3.1)

## 2021-05-02 LAB — VITAMIN B12: Vitamin B-12: 224 pg/mL (ref 180–914)

## 2021-05-02 LAB — MAGNESIUM: Magnesium: 2 mg/dL (ref 1.7–2.4)

## 2021-05-02 LAB — PHOSPHORUS: Phosphorus: 3.4 mg/dL (ref 2.5–4.6)

## 2021-05-02 LAB — FOLATE: Folate: 15.2 ng/mL (ref >5.9)

## 2021-05-02 MED ORDER — ONDANSETRON HCL 4 MG/2ML IJ SOLN: 4.0000 mg | Freq: Four times a day (QID) | INTRAMUSCULAR | Status: DC | PRN

## 2021-05-02 MED ORDER — HYDROMORPHONE 1 MG/ML IV SOLN
INTRAVENOUS | Status: DC
  Administered 2021-05-02: 30 mg via INTRAVENOUS
  Administered 2021-05-02: 2 mg via INTRAVENOUS
  Administered 2021-05-03: 0 mg via INTRAVENOUS
  Administered 2021-05-03: 1 mg via INTRAVENOUS
  Administered 2021-05-03 (×2): 0 mg via INTRAVENOUS
  Filled 2021-05-02: qty 30

## 2021-05-02 MED ORDER — OXYCODONE HCL ER 15 MG PO T12A: 15.0000 mg | EXTENDED_RELEASE_TABLET | Freq: Two times a day (BID) | ORAL | Status: DC

## 2021-05-02 MED ORDER — DIPHENHYDRAMINE HCL 12.5 MG/5ML PO ELIX: 12.5000 mg | ORAL_SOLUTION | Freq: Four times a day (QID) | ORAL | Status: DC | PRN

## 2021-05-02 MED ORDER — DEXAMETHASONE 4 MG PO TABS
4.0000 mg | ORAL_TABLET | Freq: Every day | ORAL | Status: DC
  Administered 2021-05-02 – 2021-05-05 (×4): 4 mg via ORAL
  Filled 2021-05-02 (×4): qty 1

## 2021-05-02 MED ORDER — KETOROLAC TROMETHAMINE 15 MG/ML IJ SOLN
15.0000 mg | Freq: Once | INTRAMUSCULAR | Status: AC
  Administered 2021-05-02: 09:00:00 15 mg via INTRAVENOUS
  Filled 2021-05-02: qty 1

## 2021-05-02 MED ORDER — SODIUM CHLORIDE 0.9% FLUSH: 9.0000 mL | INTRAVENOUS | Status: DC | PRN

## 2021-05-02 MED ORDER — PANTOPRAZOLE SODIUM 40 MG PO TBEC
40.0000 mg | DELAYED_RELEASE_TABLET | Freq: Two times a day (BID) | ORAL | Status: DC
  Administered 2021-05-02 – 2021-05-30 (×56): 40 mg via ORAL
  Filled 2021-05-02 (×57): qty 1

## 2021-05-02 MED ORDER — KETOROLAC TROMETHAMINE 15 MG/ML IJ SOLN
15.0000 mg | Freq: Three times a day (TID) | INTRAMUSCULAR | Status: AC
  Administered 2021-05-02 – 2021-05-03 (×3): 15 mg via INTRAVENOUS
  Filled 2021-05-02 (×3): qty 1

## 2021-05-02 MED ORDER — HYDRALAZINE HCL 50 MG PO TABS
50.0000 mg | ORAL_TABLET | Freq: Three times a day (TID) | ORAL | Status: DC
  Administered 2021-05-02 – 2021-05-30 (×84): 50 mg via ORAL
  Filled 2021-05-02 (×85): qty 1

## 2021-05-02 MED ORDER — GABAPENTIN 300 MG PO CAPS
600.0000 mg | ORAL_CAPSULE | Freq: Every day | ORAL | Status: DC
  Administered 2021-05-02 – 2021-05-30 (×28): 600 mg via ORAL
  Filled 2021-05-02 (×30): qty 2

## 2021-05-02 MED ORDER — DIPHENHYDRAMINE HCL 50 MG/ML IJ SOLN: 12.5000 mg | Freq: Four times a day (QID) | INTRAMUSCULAR | Status: DC | PRN

## 2021-05-02 MED ORDER — OXYCODONE HCL ER 10 MG PO T12A
10.0000 mg | EXTENDED_RELEASE_TABLET | Freq: Two times a day (BID) | ORAL | Status: DC
  Administered 2021-05-02 – 2021-05-03 (×2): 10 mg via ORAL
  Filled 2021-05-02 (×2): qty 1

## 2021-05-02 MED ORDER — ACETAMINOPHEN 500 MG PO TABS
1000.0000 mg | ORAL_TABLET | Freq: Three times a day (TID) | ORAL | Status: DC
  Administered 2021-05-02 – 2021-05-30 (×85): 1000 mg via ORAL
  Filled 2021-05-02 (×86): qty 2

## 2021-05-02 MED ORDER — AMLODIPINE BESYLATE 10 MG PO TABS
10.0000 mg | ORAL_TABLET | Freq: Every day | ORAL | Status: DC
  Administered 2021-05-02 – 2021-05-12 (×11): 10 mg via ORAL
  Filled 2021-05-02 (×12): qty 1

## 2021-05-02 MED ORDER — NALOXONE HCL 0.4 MG/ML IJ SOLN: 0.4000 mg | INTRAMUSCULAR | Status: DC | PRN

## 2021-05-02 NOTE — Assessment & Plan Note (Addendum)
Patient is dealing with difficult to control pain. Appreciate palliative care assistance with adjustment of pain medications - PCA d/c'd on 2/9, had pain throughout the night. He is on dexamethasone, methadone, lidocaine patch, APAP scheduled, toradol scheduled, gabapentin scheduled, robaxin with premedication for radiation. Tolerated radiation this week - Continue dilaudid for premedication before radiation Rx. Pain medications switched to PO pain medications. Pain is well controlled on oral pain medications.

## 2021-05-02 NOTE — Progress Notes (Signed)
OT Cancellation Note  Patient Details Name: Randall Larsen. MRN: [id number] DOB: Jul 04, 1955   Cancelled Treatment:    Reason Eval/Treat Not Completed: Patient at procedure or test/ unavailable Patient is off floor at this time. OT will continue to follow and check back as able.  Jackelyn Poling OTR/L, [place] [facility] Department Office# [phone] Pager# [phone]   05/02/2021, 2:04 PM

## 2021-05-02 NOTE — Assessment & Plan Note (Addendum)
Suspect related to metastatic disease.  Do not suspect ACS.

## 2021-05-02 NOTE — TOC Progression Note (Signed)
Transition of Care (TOC) - Progression Note    Patient Details  Name: Randall Larsen. MRN: [id number] Date of Birth: 26-Jan-1956  Transition of Care [facility]) CM/SW Contact  [place] Huntsman, [name], RN Phone Number: 05/02/2021, 3:04 PM  Clinical Narrative: Spoke to patient about SNF per recc-he agrees to SNF-faxed out await bed offers.      Expected Discharge Plan: [place] Barriers to Discharge: Continued Medical Work up  Expected Discharge Plan and Services Expected Discharge Plan: [place]   Discharge Planning Services: CM Consult   Living arrangements for the past 2 months: Single Family Home                                       Social Determinants of Health (SDOH) Interventions    Readmission Risk Interventions No flowsheet data found.

## 2021-05-02 NOTE — NC FL2 (Signed)
[place] LEVEL OF CARE SCREENING TOOL     IDENTIFICATION  Patient Name: Randall Larsen. Birthdate: 04-22-1955 Sex: male Admission Date (Current Location): 05/01/2021  [facility] and [place] Number:  Herbalist and Address:  [facility],  [place], [place]      Provider Number: [id number]  Attending Physician Name and Address:  Edwin Dada, *  Relative Name and Phone Number:  Auguste Tebbetts dtr [phone]    Current Level of Care: Hospital Recommended Level of Care: [place] Prior Approval Number:    Date Approved/Denied:   PASRR Number: [id number]  Discharge Plan: SNF    Current Diagnoses: Patient Active Problem List   Diagnosis Date Noted   Cancer associated pain 05/01/2021   Tobacco abuse 05/01/2021   Essential hypertension 05/01/2021   Chest pain 05/01/2021   Prostate cancer ([place]) 02/14/2021   Bone lesion 02/14/2021    Orientation RESPIRATION BLADDER Height & Weight     Self, Time, Situation  Normal Continent Weight: 99.1 kg Height:  5\' 10"  (177.8 cm)  BEHAVIORAL SYMPTOMS/MOOD NEUROLOGICAL BOWEL NUTRITION STATUS      Continent Diet (Heart Healthy)  AMBULATORY STATUS COMMUNICATION OF NEEDS Skin   Limited Assist Verbally Normal                       Personal Care Assistance Level of Assistance  Bathing, Feeding, Dressing Bathing Assistance: Limited assistance Feeding assistance: Limited assistance Dressing Assistance: Limited assistance     Functional Limitations Info  Sight, Hearing, Speech Sight Info: Adequate Hearing Info: Adequate      SPECIAL CARE FACTORS FREQUENCY  PT (By licensed PT), OT (By licensed OT)     PT Frequency:  (5x week) OT Frequency:  (5x week)            Contractures Contractures Info: Not present    Additional Factors Info  Code Status, Allergies Code Status Info:  (Full) Allergies Info:  (NKA)           Current Medications  (05/02/2021):  This is the current hospital active medication list Current Facility-Administered Medications  Medication Dose Route Frequency Provider Last Rate Last Admin   acetaminophen (TYLENOL) tablet 1,000 mg  1,000 mg Oral TID Danford, Suann Larry, MD       docusate sodium (COLACE) capsule 100 mg  100 mg Oral Q12H Doutova, Anastassia, MD   100 mg at 05/02/21 1027   HYDROmorphone (DILAUDID) injection 1 mg  1 mg Intravenous Q4H PRN Doutova, Anastassia, MD   1 mg at 05/02/21 1225   ketorolac (TORADOL) 15 MG/ML injection 15 mg  15 mg Intravenous Q8H Danford, Christopher P, MD       lidocaine (LIDODERM) 5 % 1 patch  1 patch Transdermal Q24H Doutova, Anastassia, MD   1 patch at 05/01/21 1739   metoprolol tartrate (LOPRESSOR) tablet 25 mg  25 mg Oral BID Doutova, Anastassia, MD   25 mg at 05/02/21 1027   oxyCODONE (Oxy IR/ROXICODONE) immediate release tablet 5-10 mg  5-10 mg Oral Q4H PRN Doutova, Anastassia, MD   10 mg at 05/02/21 1330   oxyCODONE (OXYCONTIN) 12 hr tablet 15 mg  15 mg Oral Q12H Danford, Suann Larry, MD       polyethylene glycol (MIRALAX / GLYCOLAX) packet 17 g  17 g Oral BID Toy Baker, MD         Discharge Medications: Please see discharge summary for a  list of discharge medications.  Relevant Imaging Results:  Relevant Lab Results:   Additional Information Not vaccinated;ss#[phone], [name], [place]

## 2021-05-02 NOTE — Consult Note (Signed)
Palliative Care Consult Note                                  Date: 05/02/2021   Patient Name: Randall Larsen.  DOB: 13-Aug-1955  MRN: [id number]  Age / Sex: 66 y.o., male  PCP: [name], MD Referring Physician: Edwin Dada, *  Reason for Consultation: Pain control  HPI/Patient Profile: Palliative Care consult requested for goals of care discussion in this 66 y.o. male  with past medical history of hypertension and metastatic castrate sensitive prostate cancer currently taking bicalutamide and he received his first Lupron injection on 04/17/21. He was admitted on 05/01/2021 from home with complaints of chest pain and uncontrolled pain.   Past Medical History:  Diagnosis Date   Hypertension    Prostate cancer ([place])      Subjective:   This NP Osborne Oman reviewed medical records, received report from team, assessed the patient and then met at the patient's bedside to discuss symptom management needs.     Mr. Beadnell is familiar to myself and palliative medicine.  I am currently following him outpatient at [facility] for ongoing symptom management needs.    Ashlyn, PA with Radiation Oncology also at the bedside. Discussions were had with patient regarding initiation of radiation to assist with multiple lesions and offered additional symptom management for his ongoing pain.  Mr. Varricchio verbalized his understanding and agreement.  Tentative plans for patient to go down for simulation later today.   We discussed at length his current pain regimen prehospitalization.  He reports pain was getting better however after attempting to put a lidocaine patch his pain seems to worsen also causing chest pain and dizziness.  He appears increasingly weaker.  He is tearful expressing appreciation of ongoing support and seeing a familiar face.  We discussed appropriate pain regimen changes by Dr. Loleta Books.  His Xtampza ER  was increased to 15 mg twice daily, continues oxycodone 5-10 mg every 4 hours as needed, scheduled Tylenol, and is receiving Toradol every 8 hours for 3 doses.  Education provided on the use of NSAID to assist with pain and inflammation.  Mr. Kynard verbalized understanding and appreciation.  Reports he continues to have pain however he feels much better than he did on yesterday.  I discussed the importance of continued conversation with family and their medical providers regarding overall plan of care and treatment options, ensuring decisions are within the context of the patients values and GOCs.  Questions and concerns were addressed.  PMT will continue to support holistically as needed.  Objective:   Primary Diagnoses: Present on Admission:  Cancer associated pain  Bone lesion  Essential hypertension  Chest pain  Prostate cancer (HCC)   Scheduled Meds:  docusate sodium  100 mg Oral Q12H   lidocaine  1 patch Transdermal Q24H   metoprolol tartrate  25 mg Oral BID   oxyCODONE  10 mg Oral Q12H   polyethylene glycol  17 g Oral BID    Continuous Infusions:   PRN Meds: acetaminophen **OR** acetaminophen, HYDROmorphone (DILAUDID) injection, oxyCODONE  No Known Allergies  Review of Systems  Musculoskeletal:  Positive for arthralgias and back pain.  Neurological:  Positive for weakness.  Unless otherwise noted, a complete review of systems is negative.  Physical Exam General: NAD, chronically-ill appearing Cardiovascular: regular rate and rhythm Pulmonary: clear ant fields, diminished bilaterally  Abdomen:  soft, nontender, + bowel sounds Extremities: Bilateral lower extremity edema Skin: no rashes, warm and dry Neurological: AAOx4  Vital Signs:  BP (!) 152/72 (BP Location: Right Arm)    Pulse 85    Temp 98.8 F (37.1 C) (Oral)    Resp (!) 21    Ht _0  (1.778 m)    Wt 99.1 kg    SpO2 95%    BMI 31.35 kg/m  Pain Scale: 0-10   Pain Score: 6   SpO2: SpO2: 95  % O2 Device:SpO2: 95 % O2 Flow Rate: .   IO: Intake/output summary:  Intake/Output Summary (Last 24 hours) at 05/02/2021 1127 Last data filed at 05/02/2021 1027 Gross per 24 hour  Intake 1168.66 ml  Output 450 ml  Net 718.66 ml    LBM: Last BM Date: 05/01/21 (pt stated "Monday Morning") Baseline Weight: Weight: 99.1 kg Most recent weight: Weight: 99.1 kg      Palliative Assessment/Data:    Advanced Care Planning:   Primary Decision Maker: PATIENT  Code Status/Advance Care Planning: Full code  Assessment & Plan:   SUMMARY OF RECOMMENDATIONS   Continue with current plan of care  PMT will continue to support and follow as needed. Please call team line or send a secure chat with urgent needs.  Symptom Management:  Pain related to neoplasm/metastatic disease OxyContin increased to 15 mg every 12 hours Oxy IR 5-10 mg every 4 hours as needed for breakthrough pain Lidocaine patch every 24 hours Scheduled Tylenol 1000 mg 3 times daily Toradol 15 mg every 8 hours x 3 doses Would consider Celebrex 200 mg daily once Toradol is discontinued in addition to gabapentin 600 mg nightly. Patient is scheduled to have simulation later today with tentative plan to start radiation (total of 10 treatments) on tomorrow.  Target to hopefully reduce pain/symptom burden over time. Constipation MiraLAX twice daily Senna-S twice daily as needed Colace twice daily  Dyspepsia Protonix 40 mg twice daily  Palliative Prophylaxis:  Frequent Pain Assessment  Additional Recommendations (Limitations, Scope, Preferences): Continue to treat the treatable  Psycho-social/Spiritual:  Desire for further Chaplaincy support: no Additional Recommendations:  Ongoing symptom management support  Prognosis:  Unable to determine  Discharge Planning:  Recommendations for SNF rehab.  I will plan to continue to follow patient closely for any ongoing symptom management needs/palliative at [facility].    Patient expressed understanding and was in agreement with this plan.   Time Total: 50 min  Visit consisted of counseling and education dealing with the complex and emotionally intense issues of symptom management and palliative care in the setting of serious and potentially life-threatening illness.Greater than 50%  of this time was spent counseling and coordinating care related to the above assessment and plan.  Signed by:  Alda Lea, AGPCNP-BC Palliative Medicine Team  Phone: [phone] Pager: [phone] Amion: Bjorn Pippin   Thank you for allowing the Palliative Medicine Team to assist in the care of this patient. Please utilize secure chat with additional questions, if there is no response within 30 minutes please call the above phone number. Palliative Medicine Team providers are available by phone from 7am to 5pm daily and can be reached through the team cell phone.  Should this patient require assistance outside of these hours, please call the patient's attending physician.

## 2021-05-02 NOTE — TOC Initial Note (Signed)
Transition of Care (TOC) - Initial/Assessment Note    Patient Details  Name: Randall Larsen. MRN: [id number] Date of Birth: Aug 26, 1955  Transition of Care [facility]) CM/SW Contact:    Dessa Phi, RN Phone Number: 05/02/2021, 12:30 PM  Clinical Narrative: Monitor for d/c needs.                  Expected Discharge Plan: Home/Self Care Barriers to Discharge: Continued Medical Work up   Patient Goals and CMS Choice Patient states their goals for this hospitalization and ongoing recovery are:: home CMS Medicare.gov Compare Post Acute Care list provided to:: Patient Choice offered to / list presented to : Patient  Expected Discharge Plan and Services Expected Discharge Plan: Home/Self Care   Discharge Planning Services: CM Consult   Living arrangements for the past 2 months: Single Family Home                                      Prior Living Arrangements/Services Living arrangements for the past 2 months: Single Family Home Lives with:: Self Patient language and need for interpreter reviewed:: Yes Do you feel safe going back to the place where you live?: Yes      Need for Family Participation in Patient Care: No (Comment) Care giver support system in place?: Yes (comment)   Criminal Activity/Legal Involvement Pertinent to Current Situation/Hospitalization: No - Comment as needed  Activities of Daily Living Home Assistive Devices/Equipment: Cane (specify quad or straight) ADL Screening (condition at time of admission) Patient's cognitive ability adequate to safely complete daily activities?: Yes Is the patient deaf or have difficulty hearing?: No Does the patient have difficulty seeing, even when wearing glasses/contacts?: No Does the patient have difficulty concentrating, remembering, or making decisions?: No Patient able to express need for assistance with ADLs?: Yes Does the patient have difficulty dressing or bathing?: No Independently performs ADLs?: Yes  (appropriate for developmental age) Does the patient have difficulty walking or climbing stairs?: Yes Weakness of Legs: Both Weakness of Arms/Hands: None  Permission Sought/Granted Permission sought to share information with : Case Manager Permission granted to share information with : Yes, Verbal Permission Granted  Share Information with NAME: Case Manager           Emotional Assessment Appearance:: Appears stated age Attitude/Demeanor/Rapport: Gracious Affect (typically observed): Accepting Orientation: : Oriented to Self, Oriented to Place, Oriented to  Time, Oriented to Situation Alcohol / Substance Use: Not Applicable Psych Involvement: No (comment)  Admission diagnosis:  Cancer associated pain [G89.3] Patient Active Problem List   Diagnosis Date Noted   Cancer associated pain 05/01/2021   Tobacco abuse 05/01/2021   Essential hypertension 05/01/2021   Chest pain 05/01/2021   Prostate cancer ([place]) 02/14/2021   Bone lesion 02/14/2021   PCP:  [name], MD Pharmacy:   [facility] [place], [place] [address] [place] [id number] Phone: [phone] Fax: [phone]     Social Determinants of Health (SDOH) Interventions    Readmission Risk Interventions No flowsheet data found.

## 2021-05-02 NOTE — Progress Notes (Signed)
Progress Note   Patient: Randall Larsen. KJZ:[id number] DOB: 1955-12-30 DOA: 05/01/2021     0 DOS: the patient was seen and examined on 05/02/2021   Brief hospital course: Mr. Vassar is a 66 y.o. 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  In th ER, rapid response had to be called due to severe chest pain.  Assessment and Plan * Cancer associated pain- (present on admission) Patient has had 7 mg of IV Dilaudid over the last 16 hours, despite his home OxyContin, plus acetaminophen, Toradol, and dexamethasone.  Chest pain is actually responding, he now has severe unrelenting lower back pain, since getting off CT machine yesterday - Stop oxycodone as needed - Continue OxyContin - Continue Toradol - Continue acetaminophen -Continue lidocaine patch - Start PCA  - Consult oncology, rad Julio Alm, palliative care  - Radiation oncology hope to perform imaging and then radiation to his mets starting tomorrow  Chest pain- (present on admission) This was from a body mass.  Do not suspect ACS.  This is resolved.  Imaging showed no dislocation of the bone.  Essential hypertension- (present on admission) Blood pressure elevated due to pain.\ -Continue metoprolol - Resume amlodipine hydralazine  Prostate cancer ([place])- (present on admission) - Hold Casodex     Subjective: Patient has severe back pain, chest pain is improving.  He feels out of breath due to the pain.  No confusion, fever.  He is somewhat sleepy.  Objective Signs reviewed and remarkable for high blood pressure. Elderly adult male, lying in fetal position, appears uncomfortable, very much in pain with any movement.  Attention somewhat diminished by sedation from opiates.  Oriented to person, place, time.  Heart rate regular, no murmurs.  Respiratory rate slightly  suppressed, no rales or wheezes.  Abdomen soft no tenderness palpation or guarding.  Data Reviewed: Discussed with palliative care and radiation oncology. Review of labs and imaging is notable for CT chest showing a dislocated rib, diffuse bony mets to the ribs, no PE, no pneumonia.  Potassium down to 3.3, alk phos elevated, D-dimer elevated, troponin flat, and low.  CBC shows mild anemia, no change from baseline, TSH normal.     Disposition: Status is: Inpatient  Remains inpatient appropriate because: He requires frequent dosing of IV opiates.  We will need to start radiation therapy tomorrow.            Author: Edwin Dada, MD 05/02/2021 6:33 PM  For on call review [email or web address].

## 2021-05-02 NOTE — Hospital Course (Addendum)
Randall Larsen is a 66 y.o. M with hx. Prostate CA metastatic to anterior ribs, left iliac, scapulae bilateral, pubic rami and bilateral proximal femurs, who presented to the ED with worsening Chest Pain related to his metastatic disease. He was evaluated for urgent radiation therapy and started radiation treatment on 05/04/2021.  Palliative care consulted and assisted with pain management.  He had difficult and prolonged course with difficult to control pain. He was started on Dilaudid PCA and continued to remain on PCA.  Multiple pain regimens were attempted.  On 2/10-2/11 he was found on the ground (denies falling and says he eased himself to ground).  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  He was seen by neurosurgery who did not recommend any surgical intervention.  Radiation oncology has adjusted his treatment plan.  Finally pain has improved and he was successfully transitioned to oral pain medications.  Insurance authorization approved for [facility] for rehabilitation. Patient is being discharged on oral pain medications.

## 2021-05-02 NOTE — Progress Notes (Signed)
BLE venous duplex has been completed.  Results can be found under chart review under CV PROC. 05/02/2021 10:29 AM Mohamad Bruso RVT, RDMS

## 2021-05-02 NOTE — Assessment & Plan Note (Addendum)
He follows with Dr. Lorenso Courier as an outpatient. S/p lupron injection on 04/17/2020, continue q 3 months. Plan for abiraterone 1000 mg PO daily in outpatient setting per oncology. Stop bicalutamide per oncology

## 2021-05-02 NOTE — Evaluation (Signed)
Physical Therapy Evaluation Patient Details Name: Randall Larsen. MRN: [id number] DOB: Nov 27, 1955 Today's Date: 05/02/2021  History of Present Illness  Randall Larsen. is a 66 y.o. male with medical history significant of prostate Ca with mets to bones. Presented with chest pain.  Clinical Impression  Pt presented with dependencies in mobility secondary to the above diagnosis. Pt currently is limited by pain unrelieved with current meds. Pt ambulated in the hall with min assist with RW. Pt reported increased back pain and then at the end of the walk c/o chest pain on the left side RN notified. Pt currently lives alone and has 6-7 steps to enter his apartment. Pt will continue to benefit from acute skilled PT to maximize mobility and independence for the next venue of care.      Recommendations for follow up therapy are one component of a multi-disciplinary discharge planning process, led by the attending physician.  Recommendations may be updated based on patient status, additional functional criteria and insurance authorization.  Follow Up Recommendations Skilled nursing-short term rehab (<3 hours/day)    Assistance Recommended at Discharge Frequent or constant Supervision/Assistance  Patient can return home with the following  A little help with walking and/or transfers;Assistance with cooking/housework;Assist for transportation;Help with stairs or ramp for entrance    Equipment Recommendations Rolling walker (2 wheels)  Recommendations for Other Services       Functional Status Assessment Patient has had a recent decline in their functional status and demonstrates the ability to make significant improvements in function in a reasonable and predictable amount of time.     Precautions / Restrictions Precautions Precautions: Fall Restrictions Weight Bearing Restrictions: No      Mobility  Bed Mobility Overal bed mobility: Needs Assistance Bed Mobility: Supine to Sit, Sit to  Supine     Supine to sit: Supervision, HOB elevated Sit to supine: Min assist, HOB elevated   General bed mobility comments: min assist to lift LE in bed secondary to c/o pain    Transfers Overall transfer level: Needs assistance Equipment used: Rolling walker (2 wheels) Transfers: Sit to/from Stand Sit to Stand: Min guard, From elevated surface           General transfer comment: cues for hand placement    Ambulation/Gait Ambulation/Gait assistance: Min assist Gait Distance (Feet): 60 Feet Assistive device: Rolling walker (2 wheels) Gait Pattern/deviations: Step-through pattern, Decreased stride length Gait velocity: decreased     General Gait Details: Cues for upright posture and to stay inside RW, pain in back increased with gait  Stairs            Wheelchair Mobility    Modified Rankin (Stroke Patients Only)       Balance Overall balance assessment: Needs assistance Sitting-balance support: No upper extremity supported, Feet supported Sitting balance-Leahy Scale: Good     Standing balance support: Bilateral upper extremity supported Standing balance-Leahy Scale: Fair                               Pertinent Vitals/Pain Pain Assessment Pain Assessment: 0-10 Pain Score: 8  Pain Location: back increased with ambulation, chest pain started after ambulation, RN notified Pain Descriptors / Indicators: Discomfort, Moaning, Pressure, Grimacing Pain Intervention(s): Limited activity within patient's tolerance, Repositioned, Heat applied, Relaxation, Patient requesting pain meds-RN notified    Home Living Family/patient expects to be discharged to:: Private residence Living Arrangements: Alone   Type of  Home: Apartment Home Access: Stairs to enter Entrance Stairs-Rails: Right Entrance Stairs-Number of Steps: 6-7   Home Layout: One level Home Equipment: Cane - single point      Prior Function Prior Level of Function :  Independent/Modified Independent                     Hand Dominance        Extremity/Trunk Assessment   Upper Extremity Assessment Upper Extremity Assessment: Defer to OT evaluation    Lower Extremity Assessment Lower Extremity Assessment: Overall WFL for tasks assessed    Cervical / Trunk Assessment Cervical / Trunk Assessment: Normal  Communication   Communication: No difficulties  Cognition Arousal/Alertness: Awake/alert Behavior During Therapy: WFL for tasks assessed/performed Overall Cognitive Status: Within Functional Limits for tasks assessed                                          General Comments General comments (skin integrity, edema, etc.): edema in bil LE    Exercises     Assessment/Plan    PT Assessment Patient needs continued PT services  PT Problem List Decreased strength;Decreased balance;Pain;Decreased mobility;Decreased activity tolerance;Decreased safety awareness       PT Treatment Interventions DME instruction;Functional mobility training;Patient/family education;Therapeutic activities;Gait training;Therapeutic exercise;Stair training;Balance training    PT Goals (Current goals can be found in the Care Plan section)  Acute Rehab PT Goals Patient Stated Goal: To not have pain PT Goal Formulation: With patient Time For Goal Achievement: 05/16/21 Potential to Achieve Goals: Good    Frequency Min 2X/week     Co-evaluation               AM-PAC PT "6 Clicks" Mobility  Outcome Measure Help needed turning from your back to your side while in a flat bed without using bedrails?: A Little Help needed moving from lying on your back to sitting on the side of a flat bed without using bedrails?: A Little Help needed moving to and from a bed to a chair (including a wheelchair)?: A Little Help needed standing up from a chair using your arms (e.g., wheelchair or bedside chair)?: A Little Help needed to walk in hospital  room?: A Little Help needed climbing 3-5 steps with a railing? : A Little 6 Click Score: 18    End of Session Equipment Utilized During Treatment: Gait belt Activity Tolerance: Patient limited by pain Patient left: in bed;with bed alarm set;with call bell/phone within reach Nurse Communication: Mobility status PT Visit Diagnosis: Muscle weakness (generalized) (M62.81);Pain;Difficulty in walking, not elsewhere classified (R26.2) Pain - part of body:  (chest, back)    Time: 8115-7262 PT Time Calculation (min) (ACUTE ONLY): 27 min   Charges:   PT Evaluation $PT Eval Moderate Complexity: 1 Mod PT Treatments $Gait Training: 8-22 mins        Lelon Mast 05/02/2021, 12:34 PM

## 2021-05-02 NOTE — Assessment & Plan Note (Addendum)
Blood pressure intermittently remains elevated due to ongoing pain. Continue metoprolol, hydralazine. Continue Lasix

## 2021-05-03 ENCOUNTER — Ambulatory Visit
Admit: 2021-05-03 | Discharge: 2021-05-03 | Disposition: A | Discharge status: Home / Self Care | Payer: Medicare HMO | Attending: Radiation Oncology | Admitting: Radiation Oncology

## 2021-05-03 ENCOUNTER — Inpatient Hospital Stay: Payer: Medicare HMO | Admitting: Nurse Practitioner

## 2021-05-03 DIAGNOSIS — C7951 Secondary malignant neoplasm of bone: Secondary | ICD-10-CM

## 2021-05-03 DIAGNOSIS — I10 Essential (primary) hypertension: Secondary | ICD-10-CM | POA: Diagnosis not present

## 2021-05-03 DIAGNOSIS — R079 Chest pain, unspecified: Secondary | ICD-10-CM | POA: Diagnosis not present

## 2021-05-03 DIAGNOSIS — G893 Neoplasm related pain (acute) (chronic): Secondary | ICD-10-CM | POA: Diagnosis not present

## 2021-05-03 DIAGNOSIS — C61 Malignant neoplasm of prostate: Secondary | ICD-10-CM | POA: Diagnosis not present

## 2021-05-03 DIAGNOSIS — R52 Pain, unspecified: Secondary | ICD-10-CM | POA: Diagnosis not present

## 2021-05-03 MED ORDER — OXYCODONE HCL ER 10 MG PO T12A
10.0000 mg | EXTENDED_RELEASE_TABLET | Freq: Three times a day (TID) | ORAL | Status: DC
  Administered 2021-05-03 – 2021-05-05 (×5): 10 mg via ORAL
  Filled 2021-05-03 (×5): qty 1

## 2021-05-03 MED ORDER — HYDROMORPHONE 1 MG/ML IV SOLN
INTRAVENOUS | Status: DC
  Administered 2021-05-03: 2 mg via INTRAVENOUS
  Administered 2021-05-03: 1 mg via INTRAVENOUS
  Administered 2021-05-04: 4.5 mg via INTRAVENOUS
  Administered 2021-05-04: 1.5 mg via INTRAVENOUS

## 2021-05-03 NOTE — Progress Notes (Addendum)
°  Progress Note   Patient: Randall Larsen. AXK:[id number] DOB: 1955/11/22 DOA: 05/01/2021     1 DOS: the patient was seen and examined on 05/03/2021       Brief hospital course: Mr. Sherod is a 66 y.o. 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  In th ER, rapid response had to be called due to severe chest pain.      Assessment and Plan * Cancer associated pain- (present on admission) Admitted and failed PRN IV Dilaudid, started PCA on hospital day 2.  Rad Onc consulted, plans to start radiation to bone mets tomorrow.    -Continue Oxycontin from home -Hold home oxycodone - Continue PCA - Change Toradol to dexamethasone - Continue scheduled acetaminophen, lidocaine patch  -Continue gabapentin  - Consult oncology, rad Julio Alm, palliative care    Chest pain- (present on admission) This was from a bony metastasis.  Do not suspect ACS.  This is resolved.  Imaging showed no dislocation of the bone.  Essential hypertension- (present on admission) Blood pressure still somewhat elevated -Continue metoprolol, amlodipine, hydralazine   Bone metastases ([place])- (present on admission) See above  Prostate cancer ([place])- (present on admission) - Hold Casodex     Subjective: Patient's pain is better controlled today, he has pain with coughing, pain with deep inspiration.  Pain with movement.  No fever.  No confusion.  Objective Vital signs reviewed and remarkable for slightly elevated blood pressure, T-max 100.3. Elderly adult male, sitting up in recliner, sleepy but no acute distress, attention normal, affect normal, judgment insight appear slightly subdued due to PCA pump.  Heart rate regular, lower extremity edema noted, normal respiratory rate and rhythm  Data Reviewed: Discussed with radiation oncology PA No new labs  or imaging to review Repeat BMP tomorrow ordered  Family Communication: Called to son and daughter, no answer  Disposition: Status is: Inpatient  Remains inpatient appropriate because: He requires ongoing IV opiates, frequently dosed with the PCA pump.  Will start radiation tomorrow, hopefully we can transition back to oral pain medications once radiation is started, and discharge to SNF early next week with plans to follow-up completion of radiation as an outpatient            Author: Edwin Dada, MD 05/03/2021 1:55 PM  For on call review [email or web address].

## 2021-05-03 NOTE — Progress Notes (Signed)
Palliative Medicine Inpatient Follow Up Note     Chart Reviewed. Patient assessed at the bedside.   [name] is resting in bed comfortably. Expresses he is feeling much better.  He went down this morning for his simulation with plans to start radiation in the upcoming days.  No acute distress noted.  We discussed at length his current pain regimen and effectiveness.  He reports he is comfortable when lying down however when standing up or with significant movement he has severe back pain with supersedes his sternal pain.  Also states he has some hip pain.  States pain was increased during simulation but he was able to tolerate.  He is currently on a Dilaudid PCA at 0.5 mg per dose with a lockout of 1.5 mg.  I personally reviewed MAR history and IV pump history for total usage.  Surprisingly he has only used 2.5 mg over the past 24 hours.  We discussed weaning off of the PCA over the next 24 hours with hopes of finding a effective oral regimen.  He verbalized understanding and agreement.  Given his total PCA and IV use of dilaudid would convert his oral Oxycontin to 10 mg every 8hrs vs every 12hrs. He is on dexamethasone and hope to see some improvement with pharmacological and radiation interventions.  Questions addressed and support provided.    Objective Assessment: Vital Signs Vitals:   05/03/21 0439 05/03/21 1051  BP: (!) 154/74   Pulse: 85   Resp: 20 18  Temp: 99.1 F (37.3 C)   SpO2: 92% 95%    Intake/Output Summary (Last 24 hours) at 05/03/2021 1118 Last data filed at 05/03/2021 0600 Gross per 24 hour  Intake 150 ml  Output 700 ml  Net -550 ml   Last Weight  Most recent update: 05/01/2021  8:40 PM    Weight  99.1 kg (218 lb 7.6 oz)            Gen:  NAD CV: Regular rate and rhythm, no murmurs rubs or gallops PULM: clear to auscultation bilaterally. No wheezes/rales/rhonchi ABD: soft/nontender/nondistended/normal bowel sounds EXT: bilateral lower extremity edema   Neuro: Alert and oriented x3  SUMMARY OF RECOMMENDATIONS   Continue with current plan of care per medical team   Symptom Management:  Pain related to neoplasm/metastatic disease  Decrease Dilaudid PCA to 0.25 mg with plans to           transition to oral medications on tomorrow. OxyContin increased to 10 mg every 8 hrs  Lidocaine patch every 24 hours Scheduled Tylenol 1000 mg 3 times daily Toradol 15 mg every 8 hours x 3 doses-completed  Gabapentin 600 mg nightly. Patient completed simulation today with tentative plan to start radiation (total of 10 treatments) on tomorrow per notations.  Target to hopefully reduce pain/symptom burden over time. Constipation MiraLAX twice daily Senna-S twice daily as needed Colace twice daily  Dyspepsia Protonix 40 mg twice daily PMT will continue to support and follow. Please secure chat for urgent needs.   Discussed with Dr. Loleta Books  Time Total: 35 min.   Visit consisted of counseling and education dealing with the complex and emotionally intense issues of symptom management and palliative care in the setting of serious and potentially life-threatening illness.Greater than 50%  of this time was spent counseling and coordinating care related to the above assessment and plan.  Alda Lea, AGPCNP-BC  Palliative Medicine Team [phone]  Palliative Medicine Team providers are available by phone from 7am to 7pm  daily and can be reached through the team cell phone. Should this patient require assistance outside of these hours, please call the patient's attending physician.

## 2021-05-03 NOTE — TOC Progression Note (Signed)
Transition of Care (TOC) - Progression Note    Patient Details  Name: Randall Larsen. MRN: [id number] Date of Birth: 14-Aug-1955  Transition of Care [facility]) CM/SW Contact  Betania Dizon, [name], RN Phone Number: 05/03/2021, 1:46 PM  Clinical Narrative:  Bed offers given-await choice.   1. 1.3 mi [facility] [facility] [place], [place] [address] [phone] Overall rating Average 2. 1.6 mi [place] [facility] [place], [place] [address] [phone] Overall rating Much below average 3. 2.1 mi [place], [place] [address] [phone] Overall rating Much below average 4. 2.5 mi [facility] [place], [place] [address] [phone] Overall rating Below average 5. 2.8 mi [facility] at the [place], [place] [address] [phone] Overall rating Average 6. 2.8 mi [facility] and [facility] [address] [place], [place] [address] [phone] Overall rating Much below average 7. [place], [place] [address] [phone] Overall rating Much above average 8. 3.6 [place] [address] [place], [place] [address] [phone] Overall rating Average 9. 3.6 mi [facility] [address] [place], [place] [address] [phone] Overall rating Much below average 10. 3.9 mi [facility] [place], [place] [address] [phone] Overall rating Much below average 11. 4.4 mi [facility] at [place], [place] [address] [phone] Overall rating Much above average 12. 5.5 mi [facility] [address] [place],  [place] [address] [phone] Overall rating Above average 13. 8.2 [facility] [place], [place] [address] [phone] Overall rating Much above average 14. 9 mi [facility] [address] [place], [place] [address] [phone] Overall rating Above average 15. 9.1 mi [facility] and [place], [place] [address] [phone] Overall rating Average 16. 9.2 mi [facility] [address] [place], [place] [address] [phone] Overall rating Much above average 17. 10.8 mi [place] at [facility] [address] [place], [place] [address] [phone] Overall rating Much above average 18. 12.6 mi [facility] [address] [place], [place] [address] [phone] Overall rating Much below average 19. 12.8 [facility] [place], [place] [address] [phone] Overall rating Much below average 20. 14.2 mi [facility] [address] [place], [place] [address] [phone] Overall rating Below average 21. 14.4 mi [facility] at [place], [place] [address] [phone] Overall rating Above average 22. 14.8 mi [place] [facility] [place], [place] [address] [phone] Overall rating Much above average 23. 14.9 [place] [address] [place], [place] [address] [phone] Overall rating Much below average 24. 16.5 mi [facility] [address] [place], [place] [address] [phone] Overall rating Average 25. 16.7 mi [facility] [place], [place] [address] [phone] Overall rating Much above average 26. 17.9 mi [place],  [place] [address] [phone] Overall rating Below average 27. 65.5 [place]  [facility] [place], [place] [address] [phone] Overall rating Much below average 28. 19.7 mi [place] [address] [place], [place] [address] [phone] Overall rating Much below average 29. 20 [address] at the [facility], [place] [address] [phone] Overall rating Much above average 30. 21.1 mi [facility] [place], [place] [address] [phone] Overall rating Much below average 31. 21.6 [address] [place], [place] [address] [phone] Overall rating Below average 32. 21.6 mi [facility] [address] [place], [place] [address] [phone] Overall rating Average 33. 21.8 [facility] [address] [place], [place] [address] [phone] Overall rating Much above average 34. [address] [place], [place] [address] [phone] Overall rating Much above average 35. 22.6 mi [facility] [address] [place], [place] [address] [phone] Overall rating Average 36. 22.7 mi [facility] [place], [place] [address] [phone] Overall rating Much below average 37. 23.3 mi [facility] [place] [address] [place], [place] [address] [phone] Overall rating Above average 38. 23.5 [place], [place] [address] [phone] Overall rating Not available18 39. 24.1 mi [facility] [place] [address] [place], [place] [address] [phone] Overall rating Much below average 40. 24.2 mi [facility] [address] [place], [place]  [address] [phone] Overall rating Below average 41. 24.4 [facility] [address] [place], [place] [address] [phone] Overall rating Much below average 42. 24.5 mi [facility] [place], [place] [address] [phone] Overall rating Above average To explore and download nursing h  Expected Discharge Plan: [place] Barriers to Discharge: Continued Medical Work up  Expected Discharge Plan and Services Expected Discharge Plan: [place]   Discharge Planning Services: CM Consult   Living arrangements for the past 2 months: Single Family Home                                       Social Determinants of Health (SDOH) Interventions    Readmission Risk Interventions No flowsheet data found.

## 2021-05-03 NOTE — Evaluation (Signed)
Occupational Therapy Evaluation Patient Details Name: Randall Larsen. MRN: [id number] DOB: October 01, 1955 Today's Date: 05/03/2021   History of Present Illness Randall Larsen. is a 66 y.o. male with medical history significant of prostate Ca with mets to bones. Presented with chest pain.   Clinical Impression   Randall Larsen is a 66 year old man who presents with decreased functional use of LUE and decreased ROM of RLE secondary to pain as well as decreased activity tolerance resulting in a decline in functional abilities. Currently patient requiring mod assist for LB ADLs, min guard for toileting and set up for UB ADLs. He reports pain in left chest limiting use of left arm and right hip and back - limiting tolerance for movement. Patient overall min assist for transfer out of bed and min guard for standing at side of bed and sink. Patient will benefit from skilled OT services while in hospital to improve deficits and learn compensatory strategies as needed in order to return to learn modified independence. Patient's pain and poor activity tolerance limits his ability to perform independent ADLs and significantly impacts his ability to manage his home and IADLs. Recommend short term rehab at discharge to maximize patient's functional abilities and compensatory strategies prior to return home alone.        Recommendations for follow up therapy are one component of a multi-disciplinary discharge planning process, led by the attending physician.  Recommendations may be updated based on patient status, additional functional criteria and insurance authorization.   Follow Up Recommendations  Skilled nursing-short term rehab (<3 hours/day)    Assistance Recommended at Discharge Intermittent Supervision/Assistance  Patient can return home with the following A little help with walking and/or transfers;A little help with bathing/dressing/bathroom;Assistance with cooking/housework;Help with stairs or ramp  for entrance    Functional Status Assessment  Patient has had a recent decline in their functional status and demonstrates the ability to make significant improvements in function in a reasonable and predictable amount of time.  Equipment Recommendations   (TBD)    Recommendations for Other Services       Precautions / Restrictions Precautions Precautions: Other (comment) Precaution Comments: pain in left chest, right hip Restrictions Weight Bearing Restrictions: No      Mobility Bed Mobility                    Transfers                          Balance Overall balance assessment: Mild deficits observed, not formally tested                                         ADL either performed or assessed with clinical judgement   ADL Overall ADL's : Needs assistance/impaired Eating/Feeding: Independent   Grooming: Min guard;Standing;Wash/dry face Grooming Details (indicate cue type and reason): at sink Upper Body Bathing: Set up;Min guard;Standing Upper Body Bathing Details (indicate cue type and reason): at sink Lower Body Bathing: Min guard;Moderate assistance Lower Body Bathing Details (indicate cue type and reason): standing at sink to perform partial bath of periarea and perianal area , unable to reach lower legs and feet due to pain. Upper Body Dressing : Set up;Sitting   Lower Body Dressing: Moderate assistance;Sit to/from stand Lower Body Dressing Details (indicate cue type and reason): unable  to don clothing over feet due to pain Toilet Transfer: Min guard;BSC/3in1   Toileting- Clothing Manipulation and Hygiene: Min guard;Sitting/lateral lean   Tub/ Shower Transfer: Min guard;Shower seat   Functional mobility during ADLs: Min guard General ADL Comments: Patient required hand hold assist to pull himself up into sitting. Min guard and significant increase in time to stand and take steps sink. Able to perform grooming and bathing  task at sink. Reports pain 6/10.     Vision Patient Visual Report: No change from baseline       Perception     Praxis      Pertinent Vitals/Pain Pain Assessment Pain Assessment: 0-10 Pain Score: 6  Pain Location: right hip, left chest Pain Descriptors / Indicators: Aching, Throbbing Pain Intervention(s): Limited activity within patient's tolerance, PCA encouraged     Hand Dominance Right   Extremity/Trunk Assessment Upper Extremity Assessment Upper Extremity Assessment: RUE deficits/detail;LUE deficits/detail RUE Deficits / Details: WFL ROM, 5/5 strength RUE Sensation: WNL RUE Coordination: WNL LUE Deficits / Details: WFL ROM, 4-/5 shoulder strength (secondary to causing pain in chest), 5/5 elbow strength, 5/5 wrist wrist, 5/5 grip LUE Sensation: WNL LUE Coordination: WNL   Lower Extremity Assessment Lower Extremity Assessment: Defer to PT evaluation   Cervical / Trunk Assessment Cervical / Trunk Assessment: Normal   Communication Communication Communication: No difficulties   Cognition Arousal/Alertness: Awake/alert Behavior During Therapy: WFL for tasks assessed/performed Overall Cognitive Status: Within Functional Limits for tasks assessed                                       General Comments       Exercises     Shoulder Instructions      Home Living Family/patient expects to be discharged to:: Private residence Living Arrangements: Alone   Type of Home: Apartment Home Access: Stairs to enter Technical brewer of Steps: 6-7 Entrance Stairs-Rails: Right Home Layout: One level     Bathroom Shower/Tub: Teacher, early years/pre: Standard     Home Equipment: Cane - single point          Prior Functioning/Environment Prior Level of Function : Independent/Modified Independent                        OT Problem List: Decreased activity tolerance;Pain;Decreased knowledge of use of DME or AE;Impaired UE  functional use;Decreased range of motion      OT Treatment/Interventions: Self-care/ADL training;DME and/or AE instruction;Therapeutic activities;Balance training;Patient/family education    OT Goals(Current goals can be found in the care plan section) Acute Rehab OT Goals Patient Stated Goal: to return to independence OT Goal Formulation: With patient Time For Goal Achievement: 05/17/21 Potential to Achieve Goals: Good  OT Frequency: Min 2X/week    Co-evaluation              AM-PAC OT "6 Clicks" Daily Activity     Outcome Measure Help from another person eating meals?: None Help from another person taking care of personal grooming?: A Little Help from another person toileting, which includes using toliet, bedpan, or urinal?: A Little Help from another person bathing (including washing, rinsing, drying)?: A Lot Help from another person to put on and taking off regular upper body clothing?: A Little Help from another person to put on and taking off regular lower body clothing?: A Lot 6 Click Score: 17  End of Session Nurse Communication: Mobility status  Activity Tolerance: Patient limited by pain Patient left: in chair;with call bell/phone within reach  OT Visit Diagnosis: Other abnormalities of gait and mobility (R26.89);Pain                Time: 1164-3539 OT Time Calculation (min): 23 min Charges:  OT General Charges $OT Visit: 1 Visit OT Evaluation $OT Eval Low Complexity: 1 Low OT Treatments $Self Care/Home Management : 8-22 mins  Orphia Mctigue, OTR/L [place]  Office [phone] Pager: [phone]   Lenward Chancellor 05/03/2021, 12:36 PM

## 2021-05-03 NOTE — Assessment & Plan Note (Deleted)
See above

## 2021-05-03 NOTE — Progress Notes (Signed)
Hematology/Oncology Progress Note  Clinical Summary: [name] is a 66 year old male with medical history significant for metastatic adenocarcinoma of the prostate who presents for intractable pain.  Interval History: -- Established care with radiation oncology with plans to start palliative radiation to painful bony metastases --Currently followed by palliative care who are managing his pain medications.  He is currently on a PCA and taking OxyContin as well as dexamethasone --Today [name] notes his pain is 7/10 with movement, better at rest  O:  Vitals:   05/03/21 1417 05/03/21 1535  BP: (!) 146/76   Pulse: 86   Resp: 20 (!) 23  Temp: 98.2 F (36.8 C)   SpO2: 95% 94%   CMP Latest Ref Rng & Units 05/02/2021 05/01/2021 05/01/2021  Glucose 70 - 99 mg/dL 130(H) - 133(H)  BUN 8 - 23 mg/dL 10 - 14  Creatinine 0.61 - 1.24 mg/dL 0.96 - 0.99  Sodium 135 - 145 mmol/L 137 - 140  Potassium 3.5 - 5.1 mmol/L 3.3(L) - 3.6  Chloride 98 - 111 mmol/L 105 - 107  CO2 22 - 32 mmol/L 23 - 24  Calcium 8.9 - 10.3 mg/dL 9.6 - 9.3  Total Protein 6.5 - 8.1 g/dL 7.6 7.0 7.6  Total Bilirubin 0.3 - 1.2 mg/dL 1.1 0.7 0.5  Alkaline Phos 38 - 126 U/L 470(H) 410(H) 440(H)  AST 15 - 41 U/L 50(H) 36 29  ALT 0 - 44 U/L 15 13 14    CBC Latest Ref Rng & Units 05/02/2021 05/01/2021 05/01/2021  WBC 4.0 - 10.5 K/uL 11.4(H) 9.4 10.2  Hemoglobin 13.0 - 17.0 g/dL 11.5(L) 11.0(L) 11.9(L)  Hematocrit 39.0 - 52.0 % 33.4(L) 32.5(L) 35.5(L)  Platelets 150 - 400 K/uL 243 213 254      GENERAL: well appearing elderly African American male in NAD  SKIN: skin color, texture, turgor are normal, no rashes or significant lesions EYES: conjunctiva are pink and non-injected, sclera clear LUNGS: clear to auscultation and percussion with normal breathing effort HEART: regular rate & rhythm and no murmurs and no lower extremity edema Musculoskeletal: no cyanosis of digits and no clubbing  PSYCH: alert & oriented x 3, fluent  speech NEURO: no focal motor/sensory deficits  Assessment/Plan: [name] is a 67 year old male with medical history significant for metastatic adenocarcinoma of the prostate who presents for intractable pain.  # Pain Control -- Appreciate the assistance of radiation oncology and palliative care in the management of this patient's oncological pain --Agree with pain medication recommendations per palliative care.  Defer to them for titration and management of this medication --Oncology will continue to follow  # Metastatic Prostate Cancer # Metastatic Spread to Bones --received lupron 22.5mg  injection on 04/17/2020, continue q 3 months --bicalutamide can be d/c at this time.  --plan to start abiraterone 1000mg  PO daily at next visit on 05/15/2021 --RTC on 05/15/2021 as scheduled. Can push this out if he is still admitted at that time.    Ledell Peoples, MD Department of Hematology/Oncology [place] [facility] Phone: [phone] Pager: [phone] Email: [email or web address]

## 2021-05-03 NOTE — Progress Notes (Signed)
°  Radiation Oncology         [phone]) [phone] ________________________________  Name: Randall Larsen. MRN: [id number]  Date: 05/03/2021  DOB: Nov 06, 1955  INPATIENT  SIMULATION AND TREATMENT PLANNING NOTE    ICD-10-CM   1. Bone metastases (HCC)  C79.51     2. Prostate cancer [facility])  C61       DIAGNOSIS:  66 yo man with multiple painful bone metastases from stage IV castration sensitive prostate cancer  NARRATIVE:  The patient was brought to the [facility].  Identity was confirmed.  All relevant records and images related to the planned course of therapy were reviewed.  The patient freely provided informed written consent to proceed with treatment after reviewing the details related to the planned course of therapy. The consent form was witnessed and verified by the simulation staff.  Then, the patient was set-up in a stable reproducible  supine position for radiation therapy.  CT images were obtained.  Surface markings were placed.  The CT images were loaded into the planning software.  Then the target and avoidance structures were contoured.  Treatment planning then occurred.  The radiation prescription was entered and confirmed.  Then, I designed and supervised the construction of a total of at least 8 medically necessary complex treatment devices to shield critical structures around each of the 4 treatment sites.  I have requested : 3D Simulation  I have requested a DVH of the following structures: Lungs, Spinal Cord, Kidneys and others.  PLAN:  The patient will receive 30 Gy in 10 fractions to the left 5th rib, sternum, right hip and lumbar spine (L2-SIJ).  ________________________________  Sheral Apley Tammi Klippel, M.D.

## 2021-05-03 NOTE — Progress Notes (Signed)
Radiation Oncology         [phone]) [phone] ________________________________  Initial inpatient Consultation  Name: Randall Larsen. 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 Prostate cancer ([facility])  C61     2. Bone metastases (HCC)  C79.51       HISTORY OF PRESENT ILLNESS: Randall Larsen. is a 66 y.o. male seen at the request of Dr. Lorenso Courier. He was initially diagnosed with Gleason 4+5 prostate cancer on 03/28/18 at [facility] in [place], [place]. Staging CT and bone scan on 05/09/17 were negative for metastatic disease. He did not pursue treatment at that time.   More recently, he presented to the ED on 02/04/21 with chest pain. Work up with chest CT showed: destructive lesion involving left lateral 5th rib with associated soft tissue in pathologic fracture; numerous lytic lesions throughout visualized skeleton; 6 mm nodule in medial RUL. He was referred to Dr. Lorenso Courier and PA Murray Hodgkins on 02/13/21. PSA performed that day was 494. He was started on bicalutamide 50 mg at that time. He underwent staging CT AP and bone scan on 02/23/21. CT AP showed: enlarged, heterogeneous prostate gland with suspected tumor extension into seminal vesicles, urinary bladder, left pelvic sidewall, and anterior wall of rectum; bilateral iliac and perirectal metastatic adenopathy; innumerable lytic bone metastases. Bone scan confirmed multifocal osseous metastatic disease. Subsequent imaging with CT AP on 04/02/21 showed progression of bony metastatic disease. He was supposed to begin Lupron 2 weeks after starting bicalutamide, but he did not show for the injection appointments. He finally received his first Lupron on 04/17/21. They plan to switch the  bicalutamide to abiraterone in early February.  Most recently, he presented to the ED on 05/01/21 with chest discomfort and requested to be seen by palliative care here at the [facility] instead. While in the lobby, he developed worsening chest pain and dizziness. He was wheeled back to the ED and subsequently admitted. CT angio chest showed significant progression of osseous metastatic disease.  He was kindly referred to [place] today for consideration of palliative radiation therapy for pain management.  PREVIOUS RADIATION THERAPY: No  PAST MEDICAL HISTORY:  Past Medical History:  Diagnosis Date   Hypertension    Prostate cancer ([place])       PAST SURGICAL HISTORY:No past surgical history on file.  FAMILY HISTORY: No family history on file.  SOCIAL HISTORY:  Social History   Socioeconomic History   Marital status: Single    Spouse name: Not on file   Number of children: Not on file   Years of education: Not on file   Highest education level: Not on file  Occupational History   Not on file  Tobacco Use   Smoking status: Every Day    Packs/day: 0.50    Years: 50.00    Pack years: 25.00    Types: Cigarettes   Smokeless tobacco: Never  Substance and Sexual Activity   Alcohol use: Not Currently    Comment: last drink was 2 weeks ago   Drug use: Not Currently    Types: Heroin   Sexual activity: Not on file  Other Topics Concern   Not on file  Social History Narrative   Not on  file   Social Determinants of Health   Financial Resource Strain: Not on file  Food Insecurity: Not on file  Transportation Needs: Not on file  Physical Activity: Not on file  Stress: Not on file  Social Connections: Not on file  Intimate Partner Violence: Not on file    ALLERGIES: Patient has no known allergies.  MEDICATIONS:  No current facility-administered medications for this encounter.   No current outpatient medications on file.   Facility-Administered Medications Ordered in Other  Encounters  Medication Dose Route Frequency Provider Last Rate Last Admin   acetaminophen (TYLENOL) tablet 1,000 mg  1,000 mg Oral TID Edwin Dada, MD   1,000 mg at 05/03/21 1543   amLODipine (NORVASC) tablet 10 mg  10 mg Oral Daily Danford, Suann Larry, MD   10 mg at 05/03/21 1101   dexamethasone (DECADRON) tablet 4 mg  4 mg Oral Daily Pickenpack-Cousar, Carlena Sax, NP   4 mg at 05/03/21 1100   diphenhydrAMINE (BENADRYL) injection 12.5 mg  12.5 mg Intravenous Q6H PRN Danford, Suann Larry, MD       Or   diphenhydrAMINE (BENADRYL) 12.5 MG/5ML elixir 12.5 mg  12.5 mg Oral Q6H PRN Danford, Suann Larry, MD       docusate sodium (COLACE) capsule 100 mg  100 mg Oral Q12H Doutova, Anastassia, MD   100 mg at 05/03/21 1101   gabapentin (NEURONTIN) capsule 600 mg  600 mg Oral QHS Pickenpack-Cousar, Athena N, NP   600 mg at 05/02/21 2107   hydrALAZINE (APRESOLINE) tablet 50 mg  50 mg Oral TID Edwin Dada, MD   50 mg at 05/03/21 1543   HYDROmorphone (DILAUDID) 1 mg/mL PCA injection   Intravenous Q4H Danford, Suann Larry, MD       lidocaine (LIDODERM) 5 % 1 patch  1 patch Transdermal Q24H Toy Baker, MD   1 patch at 05/02/21 1807   metoprolol tartrate (LOPRESSOR) tablet 25 mg  25 mg Oral BID Toy Baker, MD   25 mg at 05/03/21 1101   naloxone (NARCAN) injection 0.4 mg  0.4 mg Intravenous PRN Danford, Suann Larry, MD       And   sodium chloride flush (NS) 0.9 % injection 9 mL  9 mL Intravenous PRN Danford, Suann Larry, MD       ondansetron (ZOFRAN) injection 4 mg  4 mg Intravenous Q6H PRN Danford, Suann Larry, MD       oxyCODONE (OXYCONTIN) 12 hr tablet 10 mg  10 mg Oral Q8H Danford, Christopher P, MD       pantoprazole (PROTONIX) EC tablet 40 mg  40 mg Oral BID Pickenpack-Cousar, Athena N, NP   40 mg at 05/03/21 1100   polyethylene glycol (MIRALAX / GLYCOLAX) packet 17 g  17 g Oral BID Toy Baker, MD   17 g at 05/03/21 1101    REVIEW OF SYSTEMS:   On review of systems, the patient reports that he is doing well overall. He denies any chest pain, shortness of breath, cough, fevers, chills, night sweats, unintended weight changes. He denies any bowel or bladder disturbances, and denies abdominal pain, nausea or vomiting. A complete review of systems is obtained and is otherwise negative.    PHYSICAL EXAM:  Wt Readings from Last 3 Encounters:  05/01/21 218 lb 7.6 oz (99.1 kg)  04/25/21 222 lb 6.4 oz (100.9 kg)  04/17/21 219 lb 9 oz (99.6 kg)   Temp Readings from Last 3 Encounters:  05/03/21 98.2 F (36.8 C)  05/01/21 97.9 F (36.6 C) (Oral)  04/25/21 98.1 F (36.7 C) (Oral)   BP Readings from Last 3 Encounters:  05/03/21 (!) 146/76  05/01/21 (!) 179/92  04/25/21 (!) 164/85   Pulse Readings from Last 3 Encounters:  05/03/21 86  05/01/21 73  04/25/21 73   8/10  In general this is a well appearing man in no acute distress. He's alert and oriented x4 and appropriate throughout the examination. Cardiopulmonary assessment is negative for acute distress and he exhibits normal effort.     KPS = 50  100 - Normal; no complaints; no evidence of disease. 90   - Able to carry on normal activity; minor signs or symptoms of disease. 80   - Normal activity with effort; some signs or symptoms of disease. 42   - Cares for self; unable to carry on normal activity or to do active work. 60   - Requires occasional assistance, but is able to care for most of his personal needs. 50   - Requires considerable assistance and frequent medical care. 34   - Disabled; requires special care and assistance. 49   - Severely disabled; hospital admission is indicated although death not imminent. 74   - Very sick; hospital admission necessary; active supportive treatment necessary. 10   - Moribund; fatal processes progressing rapidly. 0     - Dead  Karnofsky DA, Abelmann Cyrus, Craver LS and Burchenal [facility] [phone]) The use of the nitrogen mustards in the  palliative treatment of carcinoma: with particular reference to bronchogenic carcinoma Cancer 1 [phone]  LABORATORY DATA:  Lab Results  Component Value Date   WBC 11.4 (H) 05/02/2021   HGB 11.5 (L) 05/02/2021   HCT 33.4 (L) 05/02/2021   MCV 89.3 05/02/2021   PLT 243 05/02/2021   Lab Results  Component Value Date   NA 137 05/02/2021   K 3.3 (L) 05/02/2021   CL 105 05/02/2021   CO2 23 05/02/2021   Lab Results  Component Value Date   ALT 15 05/02/2021   AST 50 (H) 05/02/2021   ALKPHOS 470 (H) 05/02/2021   BILITOT 1.1 05/02/2021     RADIOGRAPHY: CT Angio Chest PE W and/or Wo Contrast  Result Date: 05/01/2021 CLINICAL DATA:  Chest pain and elevated D-dimer. Prostate cancer with bone metastases. EXAM: CT ANGIOGRAPHY CHEST WITH CONTRAST TECHNIQUE: Multidetector CT imaging of the chest was performed using the standard protocol during bolus administration of intravenous contrast. Multiplanar CT image reconstructions and MIPs were obtained to evaluate the vascular anatomy. RADIATION DOSE REDUCTION: This exam was performed according to the departmental dose-optimization program which includes automated exposure control, adjustment of the mA and/or kV according to patient size and/or use of iterative reconstruction technique. CONTRAST:  57mL OMNIPAQUE IOHEXOL 350 MG/ML SOLN COMPARISON:  02/08/2021 FINDINGS: Cardiovascular: Marked motion artifacts in the lower lung zones, significantly worse than on the previous examination. This produces simulation of multiple bilateral pulmonary artery filling defects due to misregistration artifacts. No central or upper lobe pulmonary arterial filling defects are seen. The heart remains enlarged with a pericardial effusion again demonstrated, measuring 1.4 cm in thickness. Atheromatous calcifications, including the coronary arteries and aorta. Mediastinum/Nodes: No enlarged mediastinal, hilar, or axillary lymph nodes. Thyroid gland, trachea, and esophagus  demonstrate no significant findings. Lungs/Pleura: Extensive motion artifacts in the lower lung zones. No gross pulmonary nodules. The previously described 6 mm right middle lobe nodule is no longer visualized. Small left pleural effusion. Upper Abdomen: Enlarged lateral segment left lobe liver  and caudate lobe with a relatively small right lobe, similar to the previous examination. Normal sized spleen. Musculoskeletal: Extensive patchy, sclerotic bony lesions with progression. Previously demonstrated multiple lytic areas are also progressive. Interval multiple mild thoracic vertebral compression deformities. Progressive expansile lytic and sclerotic 5th rib lesion with a progressive pleural soft tissue component. Review of the MIP images confirms the above findings. IMPRESSION: 1. Marked breathing motion artifacts in the lower lung zones essentially making it impossible to evaluate for lower zone pulmonary emboli. No pulmonary emboli seen elsewhere in either lung. 2. Interval small left pleural effusion. 3. Significantly progressive bony metastatic disease with multiple interval mild thoracic vertebral compression deformities. 4. Mild calcific coronary artery and aortic atherosclerosis. Aortic Atherosclerosis (ICD10-I70.0). Electronically Signed   By: Claudie Revering M.D.   On: 05/01/2021 18:38   DG Chest Port 1 View  Result Date: 05/01/2021 CLINICAL DATA:  Chest pain EXAM: PORTABLE CHEST 1 VIEW COMPARISON:  03/05/2021 FINDINGS: Small left pleural effusion with adjacent atelectasis. Similar cardiomegaly. No pneumothorax. Left pleural thickening laterally underlying rib lesions seen on CT. IMPRESSION: Small left pleural effusion with adjacent atelectasis. Electronically Signed   By: Macy Mis M.D.   On: 05/01/2021 10:45   VAS [place] LOWER EXTREMITY VENOUS (DVT)  Result Date: 05/02/2021  Lower Venous DVT Study Patient Name:  Randall Larsen.  Date of Exam:   05/02/2021 Medical Rec #: [id number]           Accession #:    [id number] Date of Birth: [birth year]          Patient Gender: M Patient Age:   21 years Exam Location:  [facility] Procedure:      VAS [place] LOWER EXTREMITY VENOUS (DVT) Referring Phys: Nyoka Lint DOUTOVA --------------------------------------------------------------------------------  Indications: Elevated D-dimer (13.00).  Risk Factors: CA patient. Comparison Study: Previous exam on 04/07/2021 was negative for DVT. Performing Technologist: Rogelia Rohrer RVT, RDMS  Examination Guidelines: A complete evaluation includes B-mode imaging, spectral Doppler, color Doppler, and power Doppler as needed of all accessible portions of each vessel. Bilateral testing is considered an integral part of a complete examination. Limited examinations for reoccurring indications may be performed as noted. The reflux portion of the exam is performed with the patient in reverse Trendelenburg.  +---------+---------------+---------+-----------+----------+--------------+  RIGHT     Compressibility Phasicity Spontaneity Properties Thrombus Aging  +---------+---------------+---------+-----------+----------+--------------+  CFV       Full            Yes       Yes                                    +---------+---------------+---------+-----------+----------+--------------+  SFJ       Full                                                             +---------+---------------+---------+-----------+----------+--------------+  FV Prox   Full            Yes       Yes                                    +---------+---------------+---------+-----------+----------+--------------+  FV Mid    Full            Yes       Yes                                    +---------+---------------+---------+-----------+----------+--------------+  FV Distal Full            Yes       Yes                                    +---------+---------------+---------+-----------+----------+--------------+  PFV       Full                                                              +---------+---------------+---------+-----------+----------+--------------+  POP       Full            Yes       Yes                                    +---------+---------------+---------+-----------+----------+--------------+  PTV       Full                                                             +---------+---------------+---------+-----------+----------+--------------+  PERO      Full                                                             +---------+---------------+---------+-----------+----------+--------------+   +---------+---------------+---------+-----------+----------+--------------+  LEFT      Compressibility Phasicity Spontaneity Properties Thrombus Aging  +---------+---------------+---------+-----------+----------+--------------+  CFV       Full            Yes       Yes                                    +---------+---------------+---------+-----------+----------+--------------+  SFJ       Full                                                             +---------+---------------+---------+-----------+----------+--------------+  FV Prox   Full            Yes       Yes                                    +---------+---------------+---------+-----------+----------+--------------+  FV Mid    Full            Yes       Yes                                    +---------+---------------+---------+-----------+----------+--------------+  FV Distal Full            Yes       Yes                                    +---------+---------------+---------+-----------+----------+--------------+  PFV       Full                                                             +---------+---------------+---------+-----------+----------+--------------+  POP       Full            Yes       Yes                                    +---------+---------------+---------+-----------+----------+--------------+  PTV       Full                                                              +---------+---------------+---------+-----------+----------+--------------+  PERO      Full                                                             +---------+---------------+---------+-----------+----------+--------------+     Summary: BILATERAL: - No evidence of deep vein thrombosis seen in the lower extremities, bilaterally. - No evidence of superficial venous thrombosis in the lower extremities, bilaterally. -No evidence of popliteal cyst, bilaterally.  LEFT: Subcutaneous edema noted in area of calf.  *See table(s) above for measurements and observations. Electronically signed by Deitra Mayo MD on 05/02/2021 at 2:22:35 PM.    Final    VAS [place] LOWER EXTREMITY VENOUS (DVT)  Result Date: 04/07/2021  Lower Venous DVT Study Patient Name:  Randall Larsen.  Date of Exam:   04/07/2021 Medical Rec #: [id number]          Accession #:    [id number] Date of Birth: 04-Jan-1956          Patient Gender: M Patient Age:   71 years Exam Location:  [facility] Procedure:      VAS [place] LOWER EXTREMITY VENOUS (DVT) Referring Phys: Sherol Dade --------------------------------------------------------------------------------  Indications: Swelling.  Risk Factors: Cancer. Limitations: Poor ultrasound/tissue interface. Comparison Study: No prior studies. Performing Technologist: Oliver Hum RVT  Examination Guidelines: A complete evaluation includes B-mode imaging, spectral Doppler, color Doppler, and power Doppler as needed of all  accessible portions of each vessel. Bilateral testing is considered an integral part of a complete examination. Limited examinations for reoccurring indications may be performed as noted. The reflux portion of the exam is performed with the patient in reverse Trendelenburg.  +-----+---------------+---------+-----------+----------+--------------+  RIGHT Compressibility Phasicity Spontaneity Properties Thrombus Aging   +-----+---------------+---------+-----------+----------+--------------+  CFV   Full            Yes       Yes                                    +-----+---------------+---------+-----------+----------+--------------+   +---------+---------------+---------+-----------+----------+--------------+  LEFT      Compressibility Phasicity Spontaneity Properties Thrombus Aging  +---------+---------------+---------+-----------+----------+--------------+  CFV       Full            Yes       Yes                                    +---------+---------------+---------+-----------+----------+--------------+  SFJ       Full                                                             +---------+---------------+---------+-----------+----------+--------------+  FV Prox   Full                                                             +---------+---------------+---------+-----------+----------+--------------+  FV Mid    Full                                                             +---------+---------------+---------+-----------+----------+--------------+  FV Distal Full                                                             +---------+---------------+---------+-----------+----------+--------------+  PFV       Full                                                             +---------+---------------+---------+-----------+----------+--------------+  POP       Full            Yes       Yes                                    +---------+---------------+---------+-----------+----------+--------------+  PTV       Full                                                             +---------+---------------+---------+-----------+----------+--------------+  PERO      Full                                                             +---------+---------------+---------+-----------+----------+--------------+    Summary: RIGHT: - No evidence of common femoral vein obstruction.  LEFT: - There is no evidence of deep vein thrombosis in the lower  extremity.  - No cystic structure found in the popliteal fossa.  *See table(s) above for measurements and observations. Electronically signed by Monica Martinez MD on 04/07/2021 at 4:23:23 PM.    Final       IMPRESSION/PLAN: 94. 66 y.o. man with painful osseous metastatic prostate cancer with significant pain related to metastases to the sternum, left fifth rib, lumbar spine and right hip  Today, we talked to the patient and family about the findings and workup thus far. We discussed the natural history of bone metastases from prostate cancer and general treatment, highlighting the role of radiotherapy in the management of painful osseous metastases. We discussed the available radiation techniques, and focused on the details and logistics of delivery. In his case, the recommendation is for palliative radiation using 30 Gray in 10 fractions of 3 Pearline Cables to each of the 4 sites described above. We reviewed the anticipated acute and late sequelae associated with radiation in this setting. The patient was encouraged to ask questions that were answered to his satisfaction.  At the end of our conversation, the patient would like to proceed with palliative radiation.  He provided informed written consent today.   I personally spent 60 minutes in this encounter including chart review, reviewing radiological studies, meeting face-to-face with the patient, entering orders and completing documentation.    Nicholos Johns, PA-C    Tyler Pita, MD  [facility] Direct Dial: [phone]   Fax: [phone] [email or web address]   Skype   [email or web address]   This document serves as a record of services personally performed by Tyler Pita, MD and Freeman Caldron, PA-C. It was created on their behalf by Wilburn Mylar, a trained medical scribe. The creation of this record is based on the scribe's personal observations and the provider's statements to them. This document has been checked and  approved by the attending provider.

## 2021-05-04 ENCOUNTER — Ambulatory Visit
Admit: 2021-05-04 | Discharge: 2021-05-04 | Disposition: A | Discharge status: Home / Self Care | Payer: Medicare HMO | Attending: Radiation Oncology | Admitting: Radiation Oncology

## 2021-05-04 DIAGNOSIS — G893 Neoplasm related pain (acute) (chronic): Secondary | ICD-10-CM | POA: Diagnosis not present

## 2021-05-04 DIAGNOSIS — I10 Essential (primary) hypertension: Secondary | ICD-10-CM | POA: Diagnosis not present

## 2021-05-04 DIAGNOSIS — R609 Edema, unspecified: Secondary | ICD-10-CM

## 2021-05-04 DIAGNOSIS — C7951 Secondary malignant neoplasm of bone: Secondary | ICD-10-CM | POA: Diagnosis not present

## 2021-05-04 DIAGNOSIS — C61 Malignant neoplasm of prostate: Secondary | ICD-10-CM | POA: Diagnosis not present

## 2021-05-04 LAB — BASIC METABOLIC PANEL
Anion gap: 7 (ref 5–15)
BUN: 26 mg/dL — ABNORMAL HIGH (ref 8–23)
CO2: 23 mmol/L (ref 22–32)
Calcium: 8.7 mg/dL — ABNORMAL LOW (ref 8.9–10.3)
Chloride: 108 mmol/L (ref 98–111)
Creatinine, Ser: 1.01 mg/dL (ref 0.61–1.24)
GFR, Estimated: >60 mL/min (ref >60)
Glucose, Bld: 148 mg/dL — ABNORMAL HIGH (ref 70–99)
Potassium: 3.6 mmol/L (ref 3.5–5.1)
Sodium: 138 mmol/L (ref 135–145)

## 2021-05-04 LAB — PSA: Prostatic Specific Antigen: 267.52 ng/mL — ABNORMAL HIGH (ref 0.00–4.00)

## 2021-05-04 MED ORDER — KETOROLAC TROMETHAMINE 15 MG/ML IJ SOLN
15.0000 mg | Freq: Four times a day (QID) | INTRAMUSCULAR | Status: AC | PRN
  Administered 2021-05-04 – 2021-05-10 (×7): 15 mg via INTRAVENOUS
  Filled 2021-05-04 (×9): qty 1

## 2021-05-04 MED ORDER — FUROSEMIDE 20 MG PO TABS
20.0000 mg | ORAL_TABLET | Freq: Every day | ORAL | Status: DC
  Administered 2021-05-04 – 2021-05-12 (×9): 20 mg via ORAL
  Filled 2021-05-04 (×9): qty 1

## 2021-05-04 MED ORDER — HYDROMORPHONE 1 MG/ML IV SOLN
INTRAVENOUS | Status: DC
  Administered 2021-05-04: 30 mg via INTRAVENOUS
  Administered 2021-05-04 (×2): 0.5 mg via INTRAVENOUS
  Administered 2021-05-04: 5 mg via INTRAVENOUS
  Administered 2021-05-05: 0.5 mg via INTRAVENOUS
  Administered 2021-05-05: 5 mg via INTRAVENOUS
  Administered 2021-05-05: 8.5 mg via INTRAVENOUS
  Administered 2021-05-05: 2.5 mg via INTRAVENOUS
  Administered 2021-05-05: 2 mg via INTRAVENOUS
  Administered 2021-05-06: 4 mg via INTRAVENOUS
  Administered 2021-05-06 (×2): 2 mg via INTRAVENOUS
  Administered 2021-05-06: 30 mg via INTRAVENOUS
  Administered 2021-05-06: 6 mg via INTRAVENOUS
  Administered 2021-05-06: 7 mg via INTRAVENOUS
  Administered 2021-05-07: 3 mg via INTRAVENOUS
  Administered 2021-05-07: 2 mg via INTRAVENOUS
  Administered 2021-05-07: 30 mg via INTRAVENOUS
  Administered 2021-05-07: 5.5 mg via INTRAVENOUS
  Administered 2021-05-07: 6 mg via INTRAVENOUS
  Administered 2021-05-07: 7 mg via INTRAVENOUS
  Filled 2021-05-04 (×3): qty 30

## 2021-05-04 MED ORDER — POTASSIUM CHLORIDE CRYS ER 20 MEQ PO TBCR
20.0000 meq | EXTENDED_RELEASE_TABLET | Freq: Every day | ORAL | Status: DC
  Administered 2021-05-04 – 2021-05-30 (×27): 20 meq via ORAL
  Filled 2021-05-04 (×27): qty 1

## 2021-05-04 NOTE — Assessment & Plan Note (Addendum)
Echo with EF 60-65%, no RWMA, normal RVSF UA without proteinuria. Continue daily lasix LE [place] without DVT Net negative

## 2021-05-04 NOTE — Progress Notes (Signed)
°  Progress Note   Patient: Randall Larsen. ATF:[id number] DOB: 19-Dec-1955 DOA: 05/01/2021     2 DOS: the patient was seen and examined on 05/04/2021       Brief hospital course: Mr. Rog is a 66 y.o. 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  In th ER, rapid response had to be called due to severe chest pain.   1/24: Evaluated by Elsie Lincoln for urgent Radiation therapy, PCA started 1/26: Started radiation therapy      Assessment and Plan * Cancer associated pain- (present on admission) Admitted and failed PRN IV Dilaudid, started PCA on hospital day 2.  Rad Onc consulted, will start urgent radiation, probably first treatment today.    -Continue Oxycontin from home, increased dose  - Hold home oxycodone  - Continue IV Dilaudid PCA  - Continue dexamethasone - Continue Toradol for now (patient seems to have more relief from Toradol)  - Continue scheduled acetaminophen, lidocaine patch  - Continue gabapentin QHS  - Consult oncology, rad Onc, palliative care    Edema Worse from admission, do not think this is just amlodipine.  - Start oral furosemide - Start K - Check BMP  Chest pain- (present on admission) This was from a bony metastasis.  Do not suspect ACS.  This is resolved.  Imaging showed no dislocation of the bone.  Essential hypertension- (present on admission) Blood pressure still elevated -Continue metoprolol, amlodipine, hydralazine - Add Lasix  Bone metastases ([facility]CC)- (present on admission) See above  Prostate cancer ([place])- (present on admission) - Stop Casodex at d/c - Oncology will continue Lupron and start abiraterone soon           Subjective: Substantial worsening of pain in the last 24 hours.  This is mostly with movement.  No specific event started that.  No fever,  confusion.  Objective Vital signs reviewed and remarkable for mildly elevated blood pressure elderly adult male, lying in bed on his side, sleeping but arouses and is attentive and appropriate. Heart rate regular, no murmurs, mild nonpitting lower extremity edema, no JVD. Respiratory rate normal, lungs clear without rales or wheezes. Attention normal, affect appropriate, judgment and insight appear normal.  Face symmetric.  Moves all extremities with generalized weakness but symmetric strength.       Data Reviewed: Discussed with palliative care. Basic metabolic panel notable for normalized potassium, creatinine stable. PSA 267 Ultrasound negative for DVT bilaterally.      Family Communication:    Disposition: Status is: Inpatient  Remains inpatient appropriate because: Requires frequent IV opiates for severe pain, ongoing urgent radiation therapy.  The patient is significantly limited in mobility and will require SNF rehab after discharge.  I have been told by [facility] that this cannot happen until radiation therapy is completed.  In the interim, hopefully his radiation will improve the pain to the point that we can transition back to oral oxycodone from PCA            Author: Edwin Dada, MD 05/04/2021 4:19 PM  For on call review [email or web address].

## 2021-05-04 NOTE — Progress Notes (Signed)
I saw Randall Larsen in his room after his radiation appt. I asked him how he was doing with his pain. He stated that he is still experiencing quite a bit of pain with movement-rates this a 7/10. He said once he is still, the pain decreases significantly. He stated that Toradol was added to his regimen and that this helped his pain before going to radiation.  I explained to Randall Larsen that our goal is to remove the PCA pump and manage him with oral pain medications so that we can find the right regimen to manage his pain and that we could use when he goes to rehab. I informed him that we would still give him IV pain medication before radiation treatments to help decrease the pain with movement during that time. Randall Larsen verbalized understanding and agreeance. I also spoke with Randall Larsen nurse and she stated that his pain has seemed to be increased throughout the night and today. She said he seems to forget to use his button at times and has been educating him on how to use the pump. Randall Baton, NP notified. Will keep Randall Larsen PCA pump for today and tonight. Will re-evaluate pain control tomorrow.

## 2021-05-04 NOTE — Plan of Care (Signed)
  Problem: Education: Goal: Knowledge of General Education information will improve Description Including pain rating scale, medication(s)/side effects and non-pharmacologic comfort measures Outcome: Progressing   

## 2021-05-04 NOTE — Progress Notes (Signed)
PT Cancellation Note  Patient Details Name: Randall Larsen. MRN: [id number] DOB: 1955-10-21   Cancelled Treatment:    Reason Eval/Treat Not Completed: Pain limiting ability to participate   [place] 05/04/2021, 12:17 PM Jannette Spanner PT, DPT Acute Rehabilitation Services Pager: [phone] Office: [phone]

## 2021-05-05 ENCOUNTER — Inpatient Hospital Stay (HOSPITAL_COMMUNITY): Payer: Medicare HMO

## 2021-05-05 ENCOUNTER — Ambulatory Visit
Admit: 2021-05-05 | Discharge: 2021-05-05 | Disposition: A | Discharge status: Home / Self Care | Payer: Medicare HMO | Attending: Radiation Oncology | Admitting: Radiation Oncology

## 2021-05-05 DIAGNOSIS — Z515 Encounter for palliative care: Secondary | ICD-10-CM | POA: Diagnosis not present

## 2021-05-05 DIAGNOSIS — C7951 Secondary malignant neoplasm of bone: Secondary | ICD-10-CM | POA: Diagnosis not present

## 2021-05-05 DIAGNOSIS — C61 Malignant neoplasm of prostate: Secondary | ICD-10-CM | POA: Diagnosis not present

## 2021-05-05 DIAGNOSIS — G893 Neoplasm related pain (acute) (chronic): Secondary | ICD-10-CM | POA: Diagnosis not present

## 2021-05-05 LAB — TESTOSTERONE: Testosterone: 34 ng/dL — ABNORMAL LOW (ref 264–916)

## 2021-05-05 IMAGING — DX DG CHEST 1V PORT
2 series · 2 of 2 positions shown · non-contrast
Comparison: Previous studies done on [DATE]

CLINICAL DATA: Chest pain, metastatic prostatic carcinoma

EXAM:
PORTABLE CHEST 1 VIEW

[chest ap (1 of 2)]
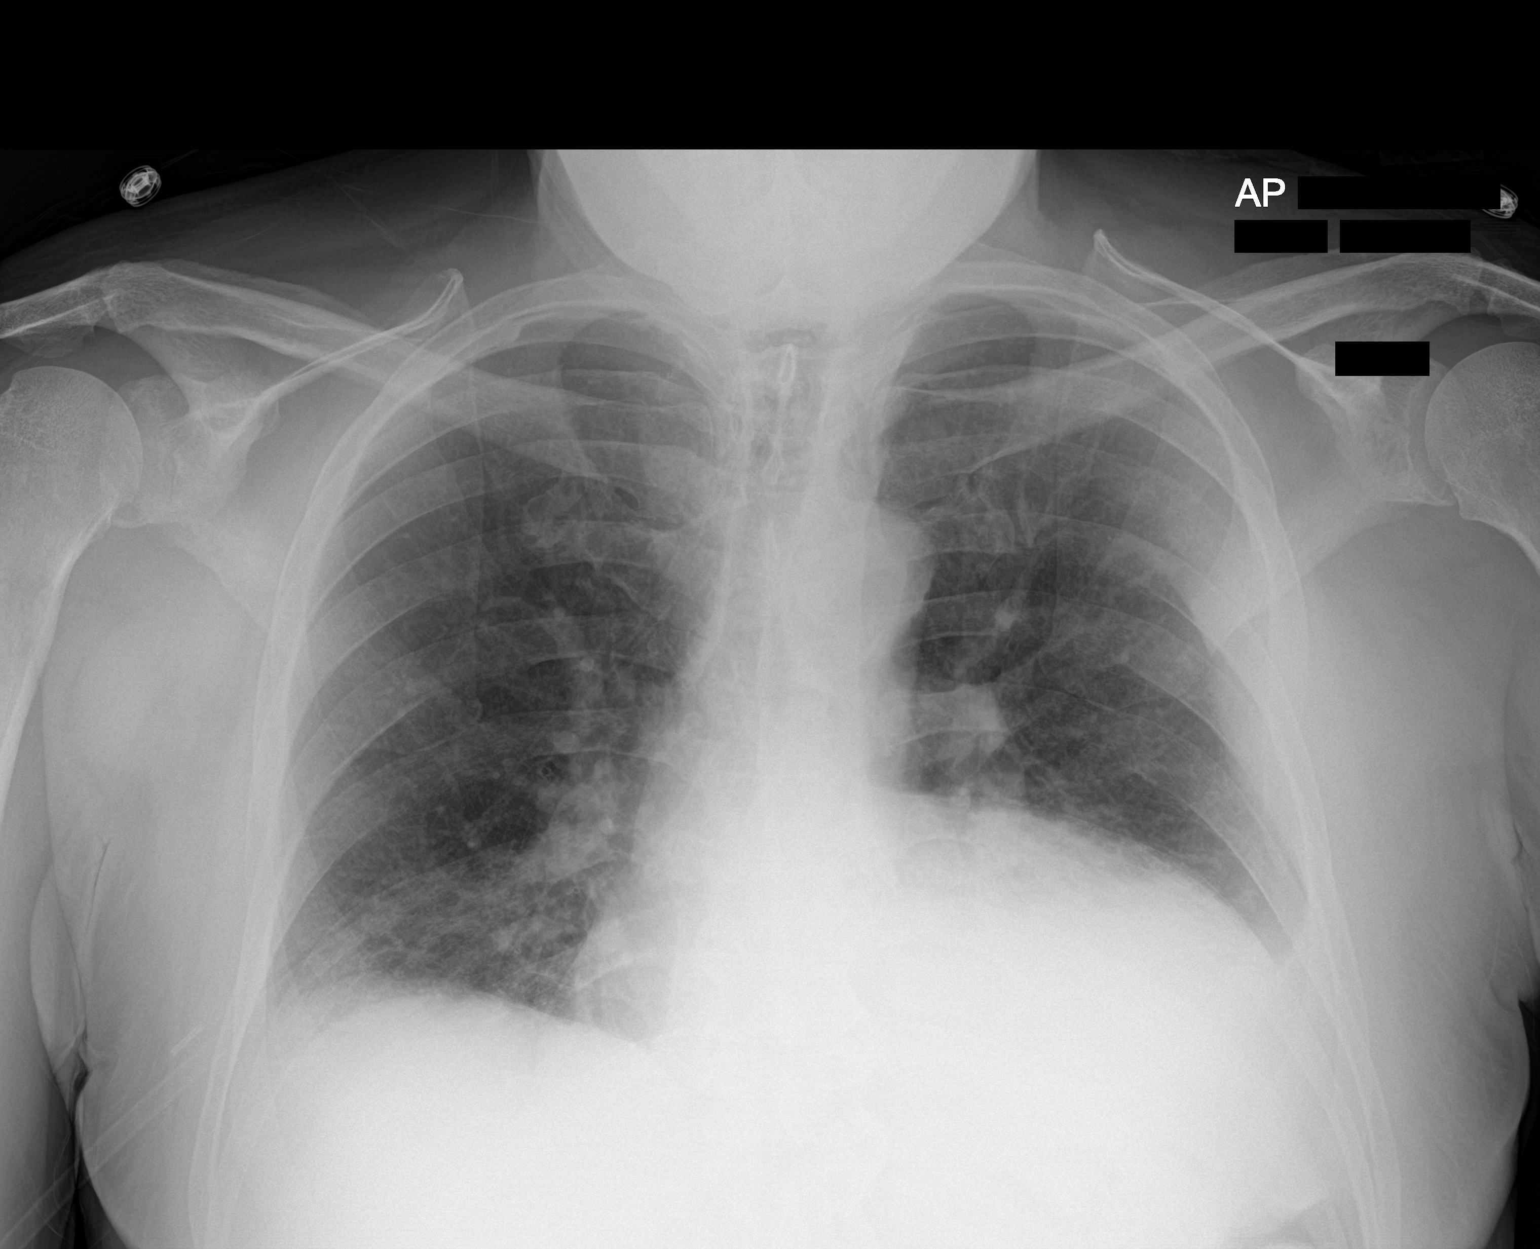

[chest ap (2 of 2)]
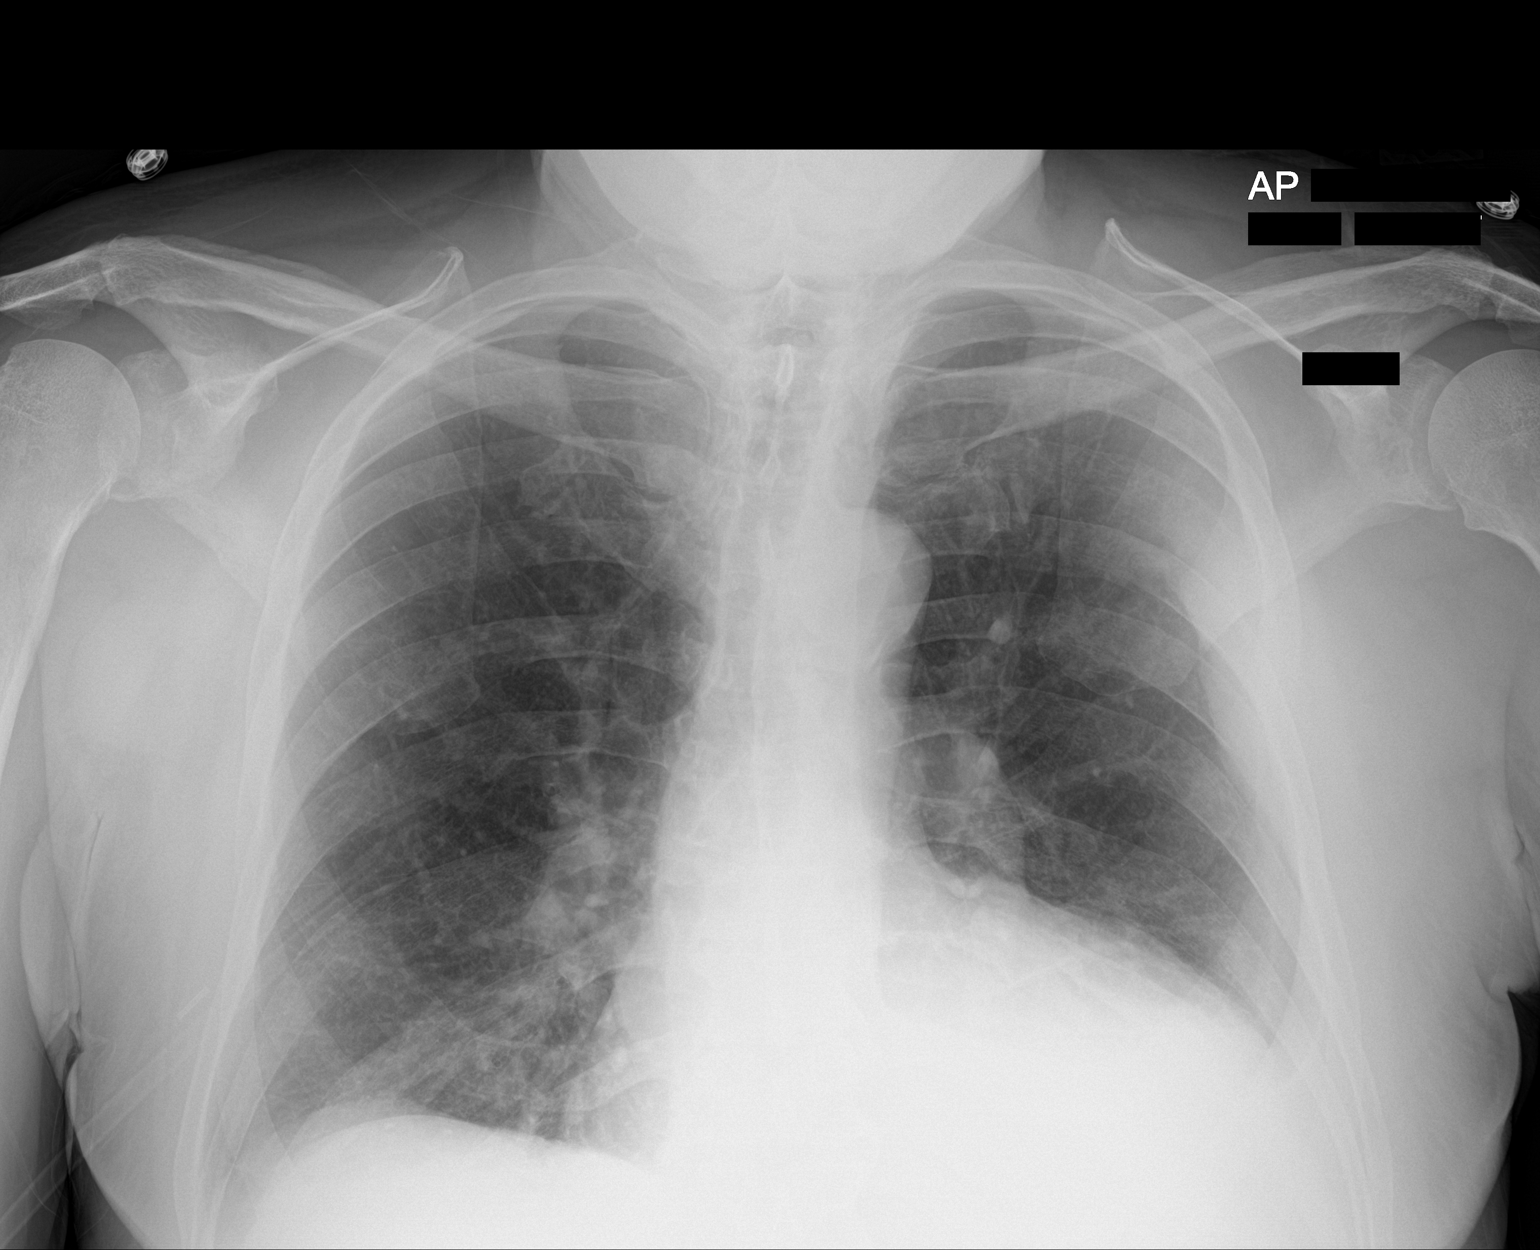

[2 of 2 positions shown; findings below may reference images not displayed]

FINDINGS: Heart is enlarged in size. There are no signs of pulmonary edema.
There is pleural density in the lateral aspect of left upper lung
fields. There is expansile lesion in the left fifth rib. There is
blunting of left lateral CP angle suggesting small effusion.
Increased density in the left lower lung fields may be due to
pleural effusion and possibly underlying atelectasis/pneumonia.
Right lateral CP angle is clear. There is no pneumothorax.
IMPRESSION: Cardiomegaly. There is increased density in the left lower lung
fields obscuring left hemidiaphragm suggesting pleural effusion and
possibly underlying infiltrate.

There is pleural density in the lateral aspect of left upper lung
fields related to expansile lesion in the left fifth rib suggesting
neoplastic process such as metastatic disease.

## 2021-05-05 MED ORDER — OXYCODONE HCL ER 15 MG PO T12A
30.0000 mg | EXTENDED_RELEASE_TABLET | Freq: Three times a day (TID) | ORAL | Status: DC
  Administered 2021-05-05 – 2021-05-10 (×16): 30 mg via ORAL
  Filled 2021-05-05 (×16): qty 2

## 2021-05-05 MED ORDER — FUROSEMIDE 10 MG/ML IJ SOLN
20.0000 mg | Freq: Once | INTRAMUSCULAR | Status: AC
  Administered 2021-05-05: 20 mg via INTRAVENOUS
  Filled 2021-05-05: qty 2

## 2021-05-05 MED ORDER — DEXAMETHASONE 4 MG PO TABS
6.0000 mg | ORAL_TABLET | Freq: Every day | ORAL | Status: DC
  Administered 2021-05-06 – 2021-05-15 (×10): 6 mg via ORAL
  Administered 2021-05-15: 2 mg via ORAL
  Administered 2021-05-16 – 2021-05-20 (×5): 6 mg via ORAL
  Filled 2021-05-05 (×17): qty 1

## 2021-05-05 MED ORDER — GABAPENTIN 300 MG PO CAPS
300.0000 mg | ORAL_CAPSULE | Freq: Two times a day (BID) | ORAL | Status: DC
  Administered 2021-05-05 – 2021-05-30 (×51): 300 mg via ORAL
  Filled 2021-05-05 (×51): qty 1

## 2021-05-05 NOTE — Progress Notes (Signed)
RN spoke with [facility] to ask about patient use of heating pad with radiation treatments. RN was advised that patient needs to avoid placement of heating pad on areas being radiated. Patient has been advised accordingly.

## 2021-05-05 NOTE — Progress Notes (Signed)
Chaplain received a consult that patient was requesting a Quran.  Chaplain inquired if a Quran only in [place] would be okay (as that is all we have currently) and he stated that he would not understand it.  Chaplain found him some resources online for both reading verses of the [place] and listening to an audio version of it.  He was very appreciative and was very grateful for the visit and for all of the care he has received.  [place], [place] Pager, [phone] 10:31 PM

## 2021-05-05 NOTE — Progress Notes (Signed)
PT Cancellation Note  Patient Details Name: Randall Larsen. MRN: [id number] DOB: August 07, 1955   Cancelled Treatment:    Reason Eval/Treat Not Completed: Pain limiting ability to participate Pt with palliative care on arrival, and they are working on adjusting his pain meds.  Pt states he would like to be more mobile however pain limiting him at this time.  Pt wanted to remain on heating pad and relax until radiation today around 11:30.    [name] 05/05/2021, 11:01 AM Arlyce Dice, DPT Acute Rehabilitation Services Pager: [phone] Office: [phone]

## 2021-05-05 NOTE — Progress Notes (Addendum)
PROGRESS NOTE    Randall Larsen.  OYD:[id number] DOB: 25-Jun-1955 DOA: 05/01/2021 PCP: [name], MD    Chief Complaint  Patient presents with   Chest Pain    Brief Narrative:  Randall Larsen is a 66 y.o. 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  In th ER, rapid response had to be called due to severe chest pain.    1/24: Evaluated by Elsie Lincoln for urgent Radiation therapy, PCA started 1/26: Started radiation therapy Remain in the hospital due to uncontrolled pain    Subjective:  Pain is not well controlled on pca Bilateral lower extremity pitting edema New oxygen requirement  Assessment & Plan:   Principal Problem:   Cancer associated pain Active Problems:   Prostate cancer ([place])   Bone metastases ([place])   Essential hypertension   Chest pain   Edema   Hypoxia, new on 1/27 Does has bilateral lower extremity pitting edema Cta negative for PE, venous doppler no DVT Does appear have  edema Will get cxr On lasix which is started on 1/26, may need higher dose pending cxr result   Prostate cancer with bone mets/uncontrolled cancer pain -On steroid, gabapentin, acetaminophen, lidocaine patch, Dilaudid PCA -Getting XRT - palliative care consult placed for symptom management/pain control  Hypertension Currently on metoprolol, Norvasc hydralazine Lasix   Body mass index is 31.35 kg/m.Marland Kitchen     Unresulted Labs (From admission, onward)     Start     Ordered   05/06/21 6720  Basic metabolic panel  Tomorrow morning,   R       Question:  Specimen collection method  Answer:  Lab=Lab collect   05/05/21 1918   05/06/21 0500  Magnesium  Tomorrow morning,   R       Question:  Specimen collection method  Answer:  Lab=Lab collect   05/05/21 1918              DVT prophylaxis: SCDs Start: 05/01/21  2105   Code Status: Full Family Communication: Patient Disposition:   Status is: Inpatient  Dispo: The patient is from: Home, lives alone              Anticipated d/c is to: SNF              Anticipated d/c date is: TBD, need pain better controlled                 Consultants:  Rad Onc Palliative care  Procedures:  XRT  Antimicrobials:    Anti-infectives (From admission, onward)    None          Objective: Vitals:   05/05/21 1200 05/05/21 1550 05/05/21 1606 05/05/21 1853  BP:  (!) 149/78    Pulse:  82    Resp: (!) 21 18 17  (!) 21  Temp:      TempSrc:      SpO2: 94% 97% 94% 97%  Weight:      Height:        Intake/Output Summary (Last 24 hours) at 05/05/2021 1918 Last data filed at 05/05/2021 1600 Gross per 24 hour  Intake 825 ml  Output 1050 ml  Net -225 ml   Filed Weights   05/01/21 2023  Weight: 99.1 kg    Examination:  General exam: alert, awake, communicative,calm, NAD Respiratory system:  diminished, no wheezing, no rhonchi, no rales. Respiratory effort normal. Cardiovascular system:  RRR.  Gastrointestinal system: Abdomen is nondistended, soft and nontender.  Normal bowel sounds heard. Central nervous system: Alert and oriented. No focal neurological deficits. Extremities: Bilateral lower extremity pitting edema Skin: No rashes, lesions or ulcers Psychiatry: Judgement and insight appear normal. Mood & affect appropriate.     Data Reviewed: I have personally reviewed following labs and imaging studies  CBC: Recent Labs  Lab 05/01/21 1355 05/01/21 2116 05/02/21 0455  WBC 10.2 9.4 11.4*  NEUTROABS 5.8 6.5 6.8  HGB 11.9* 11.0* 11.5*  HCT 35.5* 32.5* 33.4*  MCV 89.2 88.6 89.3  PLT 254 213 008    Basic Metabolic Panel: Recent Labs  Lab 05/01/21 1355 05/01/21 2116 05/02/21 0455 05/04/21 0428  NA 140  --  137 138  K 3.6  --  3.3* 3.6  CL 107  --  105 108  CO2 24  --  23 23  GLUCOSE 133*  --  130* 148*  BUN 14  --  10 26*   CREATININE 0.99  --  0.96 1.01  CALCIUM 9.3  --  9.6 8.7*  MG  --  2.0 2.0  --   PHOS  --  3.0 3.4  --     GFR: Estimated Creatinine Clearance: 84.9 mL/min (by C-G formula based on SCr of 1.01 mg/dL).  Liver Function Tests: Recent Labs  Lab 05/01/21 1355 05/01/21 2116 05/02/21 0455  AST 29 36 50*  ALT 14 13 15   ALKPHOS 440* 410* 470*  BILITOT 0.5 0.7 1.1  PROT 7.6 7.0 7.6  ALBUMIN 4.1 3.9 4.1    CBG: No results for input(s): GLUCAP in the last 168 hours.   Recent Results (from the past 240 hour(s))  Resp Panel by RT-PCR (Flu A&B, Covid) Nasopharyngeal Swab     Status: None   Collection Time: 05/01/21  7:02 PM   Specimen: Nasopharyngeal Swab; Nasopharyngeal(NP) swabs in vial transport medium  Result Value Ref Range Status   SARS Coronavirus 2 by RT PCR NEGATIVE NEGATIVE Final    Comment: (NOTE) SARS-CoV-2 target nucleic acids are NOT DETECTED.  The SARS-CoV-2 RNA is generally detectable in upper respiratory specimens during the acute phase of infection. The lowest concentration of SARS-CoV-2 viral copies this assay can detect is 138 copies/mL. A negative result does not preclude SARS-Cov-2 infection and should not be used as the sole basis for treatment or other patient management decisions. A negative result may occur with  improper specimen collection/handling, submission of specimen other than nasopharyngeal swab, presence of viral mutation(s) within the areas targeted by this assay, and inadequate number of viral copies(<138 copies/mL). A negative result must be combined with clinical observations, patient history, and epidemiological information. The expected result is Negative.  Fact Sheet for Patients:  EntrepreneurPulse.com.au  Fact Sheet for Healthcare Providers:  IncredibleEmployment.be  This test is no t yet approved or cleared by the [place] FDA and  has been authorized for detection and/or diagnosis of  SARS-CoV-2 by FDA under an Emergency Use Authorization (EUA). This EUA will remain  in effect (meaning this test can be used) for the duration of the COVID-19 declaration under Section 564(b)(1) of the Act, 21 U.S.C.section 360bbb-3(b)(1), unless the authorization is terminated  or revoked sooner.       Influenza A by PCR NEGATIVE NEGATIVE Final   Influenza B by PCR NEGATIVE NEGATIVE Final    Comment: (NOTE) The Xpert Xpress SARS-CoV-2/FLU/RSV plus assay is  intended as an aid in the diagnosis of influenza from Nasopharyngeal swab specimens and should not be used as a sole basis for treatment. Nasal washings and aspirates are unacceptable for Xpert Xpress SARS-CoV-2/FLU/RSV testing.  Fact Sheet for Patients: EntrepreneurPulse.com.au  Fact Sheet for Healthcare Providers: IncredibleEmployment.be  This test is not yet approved or cleared by the [place] FDA and has been authorized for detection and/or diagnosis of SARS-CoV-2 by FDA under an Emergency Use Authorization (EUA). This EUA will remain in effect (meaning this test can be used) for the duration of the COVID-19 declaration under Section 564(b)(1) of the Act, 21 U.S.C. section 360bbb-3(b)(1), unless the authorization is terminated or revoked.  Performed at [facility], [place] [address], [place], [place] [address]          Radiology Studies: DG CHEST PORT 1 VIEW  Result Date: 05/05/2021 CLINICAL DATA:  Chest pain, metastatic prostatic carcinoma EXAM: PORTABLE CHEST 1 VIEW COMPARISON:  Previous studies done on 05/01/2021 FINDINGS: Heart is enlarged in size. There are no signs of pulmonary edema. There is pleural density in the lateral aspect of left upper lung fields. There is expansile lesion in the left fifth rib. There is blunting of left lateral CP angle suggesting small effusion. Increased density in the left lower lung fields may be due to pleural effusion  and possibly underlying atelectasis/pneumonia. Right lateral CP angle is clear. There is no pneumothorax. IMPRESSION: Cardiomegaly. There is increased density in the left lower lung fields obscuring left hemidiaphragm suggesting pleural effusion and possibly underlying infiltrate. There is pleural density in the lateral aspect of left upper lung fields related to expansile lesion in the left fifth rib suggesting neoplastic process such as metastatic disease. Electronically Signed   By: Elmer Picker M.D.   On: 05/05/2021 17:44        Scheduled Meds:  acetaminophen  1,000 mg Oral TID   amLODipine  10 mg Oral Daily   [START ON 05/06/2021] dexamethasone  6 mg Oral Daily   docusate sodium  100 mg Oral Q12H   furosemide  20 mg Oral Daily   gabapentin  300 mg Oral BID   gabapentin  600 mg Oral QHS   hydrALAZINE  50 mg Oral TID   HYDROmorphone   Intravenous Q4H   lidocaine  1 patch Transdermal Q24H   metoprolol tartrate  25 mg Oral BID   oxyCODONE  30 mg Oral Q8H   pantoprazole  40 mg Oral BID   polyethylene glycol  17 g Oral BID   potassium chloride  20 mEq Oral Daily   Continuous Infusions:   LOS: 3 days    Greater than 50% of this time was spent in counseling, explanation of diagnosis, planning of further management, and coordination of care.   Voice Recognition Viviann Spare dictation system was used to create this note, attempts have been made to correct errors. Please contact the author with questions and/or clarifications.   Florencia Reasons, MD PhD FACP [facility]  Available via Epic secure chat 7am-7pm for nonurgent issues Please page for urgent issues To page the attending provider between 7A-7P or the covering provider during after hours 7P-7A, please log into the web site [email or web address] and access using universal [place] password for that web site. If you do not have the password, please call the hospital operator.    05/05/2021, 7:18 PM

## 2021-05-05 NOTE — TOC Progression Note (Signed)
Transition of Care (TOC) - Progression Note    Patient Details  Name: Randall Larsen. MRN: [id number] Date of Birth: [birth year]  Transition of Care [facility]) CM/SW Contact  Kymora Sciara, [name], RN Phone Number: 05/05/2021, 3:25 PM  Clinical Narrative: Awaiting choice for SNF-TOC to f/u on Monday-patient to get xrt while in hospital prior to d/c.      Expected Discharge Plan: [place] Barriers to Discharge: Continued Medical Work up  Expected Discharge Plan and Services Expected Discharge Plan: [place]   Discharge Planning Services: CM Consult   Living arrangements for the past 2 months: Single Family Home                                       Social Determinants of Health (SDOH) Interventions    Readmission Risk Interventions No flowsheet data found.

## 2021-05-06 DIAGNOSIS — Z515 Encounter for palliative care: Secondary | ICD-10-CM | POA: Diagnosis not present

## 2021-05-06 DIAGNOSIS — C7951 Secondary malignant neoplasm of bone: Secondary | ICD-10-CM | POA: Diagnosis not present

## 2021-05-06 DIAGNOSIS — G893 Neoplasm related pain (acute) (chronic): Secondary | ICD-10-CM | POA: Diagnosis not present

## 2021-05-06 DIAGNOSIS — C61 Malignant neoplasm of prostate: Secondary | ICD-10-CM | POA: Diagnosis not present

## 2021-05-06 LAB — BASIC METABOLIC PANEL
Anion gap: 10 (ref 5–15)
BUN: 27 mg/dL — ABNORMAL HIGH (ref 8–23)
CO2: 22 mmol/L (ref 22–32)
Calcium: 9.5 mg/dL (ref 8.9–10.3)
Chloride: 106 mmol/L (ref 98–111)
Creatinine, Ser: 0.99 mg/dL (ref 0.61–1.24)
GFR, Estimated: >60 mL/min (ref >60)
Glucose, Bld: 110 mg/dL — ABNORMAL HIGH (ref 70–99)
Potassium: 4.4 mmol/L (ref 3.5–5.1)
Sodium: 138 mmol/L (ref 135–145)

## 2021-05-06 LAB — MAGNESIUM: Magnesium: 2.3 mg/dL (ref 1.7–2.4)

## 2021-05-06 MED ORDER — SENNOSIDES-DOCUSATE SODIUM 8.6-50 MG PO TABS
2.0000 | ORAL_TABLET | Freq: Two times a day (BID) | ORAL | Status: DC
  Administered 2021-05-06 – 2021-05-28 (×20): 2 via ORAL
  Filled 2021-05-06 (×37): qty 2

## 2021-05-06 NOTE — Plan of Care (Signed)
°  Problem: Clinical Measurements: Goal: Diagnostic test results will improve Outcome: Progressing Goal: Respiratory complications will improve Outcome: Progressing   Problem: Pain Managment: Goal: General experience of comfort will improve Outcome: Progressing   Problem: Safety: Goal: Ability to remain free from injury will improve Outcome: Progressing

## 2021-05-06 NOTE — Progress Notes (Signed)
° °  Palliative Medicine Inpatient Follow Up Note     I saw and examined Randall Larsen in conjunction with Gabriel Rung, RN from our team who knows him from the outpatient clinic.  He was sitting at the bedside in no acute distress but reports he continues to have pain that is particularly worse whenever he is moving.  Discussed that long-term, radiation is likely to be best modality to help his pain, however, we will continue to work with multimodal pain approach.  For query of his PCA he has utilized: Hours    milligrams of Dilaudid 24    22 12     9.5 8    6.5 4    3.5 2    2.5 1    1   Review of MAR, however, indicates that he is only received 11mg .  Questions addressed and support provided.    Objective Assessment: Vital Signs Vitals:   05/06/21 0400 05/06/21 0429  BP:  (!) 177/84  Pulse:  80  Resp: 20 20  Temp:  97.9 F (36.6 C)  SpO2: 96% 96%    Intake/Output Summary (Last 24 hours) at 05/06/2021 0746 Last data filed at 05/06/2021 0200 Gross per 24 hour  Intake 585 ml  Output 1025 ml  Net -440 ml   Last Weight  Most recent update: 05/01/2021  8:40 PM    Weight  99.1 kg (218 lb 7.6 oz)            Gen:  NAD CV: Regular rate and rhythm, no murmurs rubs or gallops PULM: clear to auscultation bilaterally. No wheezes/rales/rhonchi ABD: soft/nontender/nondistended/normal bowel sounds EXT: bilateral lower extremity edema  Neuro: Alert and oriented x3  SUMMARY OF RECOMMENDATIONS   Continue with current plan of care per medical team   Symptom Management:  Pain related to neoplasm/metastatic disease PCA queried and he is still using significant amount of pain medication.  He has utilized 22 mg of IV Dilaudid in the last 24 hours.  Unfortunately, most this is related to incident pain from bony metastasis and this can be one of the most difficult types of pain to treat.  He is currently on steroids, and I increased these today as this can be more effective than opioids in  bone pain.  Bisphosphonates can also be beneficial, however, I am not sure about his dental history and consideration of risk versus benefit with bisphosphonates is necessary, particularly in regards to risk for osteonecrosis of the jaw.  OxyContin increased to 30 mg every 8 hrs  Lidocaine patch every 24 hours Scheduled Tylenol 1000 mg 3 times daily Toradol 15 mg every 8 hours x 3 doses-completed  Gabapentin increased to 300 twice daily in addition to 600 mg nightly. Long-term, radiation is the best modality to hopefully help control his pain and improve his functional status.  My concern is that it may be difficult to control his pain until we have reached a point where radiation begins to take effect.  Constipation MiraLAX twice daily Senna-S twice daily as needed Colace twice daily  Dyspepsia Protonix 40 mg twice daily  Micheline Rough, MD [place] [facility] [phone]

## 2021-05-06 NOTE — Progress Notes (Signed)
PROGRESS NOTE  [name] 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  He was evaluated for urgent radiation therapy and has started out on 1/26.  Palliative care consulted to assist with pain control, currently on Dilaudid PCA  Subjective / 24h Interval events: -He does not have much pain at rest but gets pretty out of control when he tries to move  Assessment & Plan: Principal problem Cancer associated pain  -appreciate palliative follow-up.  Currently on Dilaudid PCA, oxycodone 30 mg Q8.  He is also on steroids.  Further management per palliative  Active problems Chest pain-due to bony metastasis, not suspicious for ACS.  Essential hypertension-continue amlodipine, metoprolol, hydralazine.  Blood pressure high at times likely due to pain  Metastatic prostate cancer-undergoing radiation therapy, working on pain control currently  Obesity, class I-BMI 31.3   Scheduled Meds:  acetaminophen  1,000 mg Oral TID   amLODipine  10 mg Oral Daily   dexamethasone  6 mg Oral Daily   docusate sodium  100 mg Oral Q12H   furosemide  20 mg Oral Daily   gabapentin  300 mg Oral BID   gabapentin  600 mg Oral QHS   hydrALAZINE  50 mg Oral TID   HYDROmorphone   Intravenous Q4H   lidocaine  1 patch Transdermal Q24H   metoprolol tartrate  25 mg Oral BID   oxyCODONE  30 mg Oral Q8H   pantoprazole  40 mg Oral BID   polyethylene glycol  17 g Oral BID   potassium chloride  20 mEq Oral Daily   senna-docusate  2 tablet Oral BID   Continuous Infusions: PRN Meds:.diphenhydrAMINE **OR** diphenhydrAMINE, ketorolac, naloxone **AND** sodium chloride flush, ondansetron (ZOFRAN) IV  Diet  Orders (From admission, onward)     Start     Ordered   05/01/21 2105  Diet Heart Room service appropriate? Yes; Fluid consistency: Thin  Diet effective now       Question Answer Comment  Room service appropriate? Yes   Fluid consistency: Thin      05/01/21 2105            DVT prophylaxis: SCDs Start: 05/01/21 2105   Lab Results  Component Value Date   PLT 243 05/02/2021      Code Status: Full Code  Family Communication: No family at bedside  Status is: Inpatient  Remains inpatient appropriate because: Severity of pain  Level of care: Med-Surg  Consultants:  Palliative care Radiation oncology  Procedures:  none  Microbiology  none  Antimicrobials: none    Objective: Vitals:   05/06/21 0124 05/06/21 0400 05/06/21 0429 05/06/21 0925  BP:   (!) 177/84   Pulse:   80   Resp: 16 20 20 20   Temp:   97.9 F (36.6 C)   TempSrc:   Oral   SpO2: 98% 96% 96% 95%  Weight:      Height:        Intake/Output Summary (Last 24 hours) at 05/06/2021 1002 Last data filed at 05/06/2021 0200 Gross per 24 hour  Intake 360 ml  Output 675 ml  Net -315 ml   Wt Readings from Last 3 Encounters:  05/01/21 99.1 kg  04/25/21 100.9 kg  04/17/21 99.6 kg    Examination:  Constitutional: NAD Eyes: no scleral icterus ENMT: Mucous membranes are moist.  Neck: normal, supple Respiratory: clear to auscultation bilaterally, no wheezing, no crackles. Normal respiratory effort. Cardiovascular: Regular rate and rhythm, no murmurs / rubs / gallops. No LE edema. Abdomen: non distended, no tenderness. Bowel sounds positive.  Musculoskeletal: no clubbing / cyanosis.  Skin: no rashes Neurologic: non focal   Data Reviewed: I have independently reviewed following labs and imaging studies   CBC Recent Labs  Lab 05/01/21 1355 05/01/21 2116 05/02/21 0455  WBC 10.2 9.4 11.4*  HGB 11.9* 11.0* 11.5*  HCT 35.5* 32.5* 33.4*  PLT 254 213 243  MCV 89.2 88.6 89.3  MCH 29.9 30.0  30.7  MCHC 33.5 33.8 34.4  RDW 15.4 15.4 15.5  LYMPHSABS 3.0 1.6 3.0  MONOABS 1.1* 0.9 1.0  EOSABS 0.1 0.0 0.0  BASOSABS 0.1 0.1 0.1    Recent Labs  Lab 05/01/21 1355 05/01/21 2116 05/02/21 0455 05/04/21 0428 05/06/21 0513  NA 140  --  137 138 138  K 3.6  --  3.3* 3.6 4.4  CL 107  --  105 108 106  CO2 24  --  23 23 22   GLUCOSE 133*  --  130* 148* 110*  BUN 14  --  10 26* 27*  CREATININE 0.99  --  0.96 1.01 0.99  CALCIUM 9.3  --  9.6 8.7* 9.5  AST 29 36 50*  --   --   ALT 14 13 15   --   --   ALKPHOS 440* 410* 470*  --   --   BILITOT 0.5 0.7 1.1  --   --   ALBUMIN 4.1 3.9 4.1  --   --   MG  --  2.0 2.0  --  2.3  DDIMER 13.00*  --   --   --   --   TSH  --   --  2.690  --   --     ------------------------------------------------------------------------------------------------------------------ No results for input(s): CHOL, HDL, LDLCALC, TRIG, CHOLHDL, LDLDIRECT in the last 72 hours.  No results found for: HGBA1C ------------------------------------------------------------------------------------------------------------------ No results for input(s): TSH, T4TOTAL, T3FREE, THYROIDAB in the last 72 hours.  Invalid input(s): FREET3  Cardiac Enzymes No results for input(s): CKMB, TROPONINI, MYOGLOBIN in the last 168 hours.  Invalid input(s): CK ------------------------------------------------------------------------------------------------------------------    Component Value Date/Time   BNP 61.9 02/08/2021 0730    CBG: No results for input(s): GLUCAP in the last 168 hours.  Recent Results (from the past 240 hour(s))  Resp Panel by RT-PCR (Flu A&B, Covid) Nasopharyngeal Swab     Status: None   Collection Time: 05/01/21  7:02 PM   Specimen: Nasopharyngeal Swab; Nasopharyngeal(NP) swabs in vial transport medium  Result Value Ref Range Status   SARS Coronavirus 2 by RT PCR NEGATIVE NEGATIVE Final    Comment: (NOTE) SARS-CoV-2 target nucleic acids are NOT  DETECTED.  The SARS-CoV-2 RNA is generally detectable in upper respiratory specimens during the acute phase of infection. The lowest concentration of SARS-CoV-2 viral copies this assay can detect is 138 copies/mL. A negative result does not preclude SARS-Cov-2 infection and should not be used as the sole basis for treatment or other patient management decisions. A negative result may occur with  improper specimen collection/handling, submission of specimen other than nasopharyngeal swab, presence of viral mutation(s) within the areas targeted by this assay, and inadequate number of viral copies(<138 copies/mL). A negative result must be combined  with clinical observations, patient history, and epidemiological information. The expected result is Negative.  Fact Sheet for Patients:  EntrepreneurPulse.com.au  Fact Sheet for Healthcare Providers:  IncredibleEmployment.be  This test is no t yet approved or cleared by the [place] FDA and  has been authorized for detection and/or diagnosis of SARS-CoV-2 by FDA under an Emergency Use Authorization (EUA). This EUA will remain  in effect (meaning this test can be used) for the duration of the COVID-19 declaration under Section 564(b)(1) of the Act, 21 U.S.C.section 360bbb-3(b)(1), unless the authorization is terminated  or revoked sooner.       Influenza A by PCR NEGATIVE NEGATIVE Final   Influenza B by PCR NEGATIVE NEGATIVE Final    Comment: (NOTE) The Xpert Xpress SARS-CoV-2/FLU/RSV plus assay is intended as an aid in the diagnosis of influenza from Nasopharyngeal swab specimens and should not be used as a sole basis for treatment. Nasal washings and aspirates are unacceptable for Xpert Xpress SARS-CoV-2/FLU/RSV testing.  Fact Sheet for Patients: EntrepreneurPulse.com.au  Fact Sheet for Healthcare Providers: IncredibleEmployment.be  This test is not yet  approved or cleared by the [place] FDA and has been authorized for detection and/or diagnosis of SARS-CoV-2 by FDA under an Emergency Use Authorization (EUA). This EUA will remain in effect (meaning this test can be used) for the duration of the COVID-19 declaration under Section 564(b)(1) of the Act, 21 U.S.C. section 360bbb-3(b)(1), unless the authorization is terminated or revoked.  Performed at [facility], [place] [address] [place], [place] [address]      Radiology Studies: DG CHEST PORT 1 VIEW  Result Date: 05/05/2021 CLINICAL DATA:  Chest pain, metastatic prostatic carcinoma EXAM: PORTABLE CHEST 1 VIEW COMPARISON:  Previous studies done on 05/01/2021 FINDINGS: Heart is enlarged in size. There are no signs of pulmonary edema. There is pleural density in the lateral aspect of left upper lung fields. There is expansile lesion in the left fifth rib. There is blunting of left lateral CP angle suggesting small effusion. Increased density in the left lower lung fields may be due to pleural effusion and possibly underlying atelectasis/pneumonia. Right lateral CP angle is clear. There is no pneumothorax. IMPRESSION: Cardiomegaly. There is increased density in the left lower lung fields obscuring left hemidiaphragm suggesting pleural effusion and possibly underlying infiltrate. There is pleural density in the lateral aspect of left upper lung fields related to expansile lesion in the left fifth rib suggesting neoplastic process such as metastatic disease. Electronically Signed   By: Elmer Picker M.D.   On: 05/05/2021 17:44     Marzetta Board, MD, PhD [facility]  Between 7 am - 7 pm I am available, please contact me via Amion (for emergencies) or Securechat (non urgent messages)  Between 7 pm - 7 am I am not available, please contact night coverage MD/APP via Amion

## 2021-05-07 DIAGNOSIS — C61 Malignant neoplasm of prostate: Secondary | ICD-10-CM | POA: Diagnosis not present

## 2021-05-07 DIAGNOSIS — C7951 Secondary malignant neoplasm of bone: Secondary | ICD-10-CM | POA: Diagnosis not present

## 2021-05-07 DIAGNOSIS — Z515 Encounter for palliative care: Secondary | ICD-10-CM | POA: Diagnosis not present

## 2021-05-07 DIAGNOSIS — G893 Neoplasm related pain (acute) (chronic): Secondary | ICD-10-CM | POA: Diagnosis not present

## 2021-05-07 MED ORDER — HYDROMORPHONE 1 MG/ML IV SOLN
INTRAVENOUS | Status: DC
  Administered 2021-05-07: 8 mg via INTRAVENOUS
  Administered 2021-05-07: 6 mg via INTRAVENOUS
  Administered 2021-05-08: 8 mg via INTRAVENOUS
  Administered 2021-05-08: 3 mg via INTRAVENOUS
  Administered 2021-05-08: 30 mg via INTRAVENOUS
  Administered 2021-05-09: 7 mg via INTRAVENOUS
  Administered 2021-05-09: 6 mg via INTRAVENOUS
  Administered 2021-05-09: 0 mg via INTRAVENOUS
  Administered 2021-05-09: 30 mg via INTRAVENOUS
  Administered 2021-05-09: 8 mg via INTRAVENOUS
  Administered 2021-05-10: 30 mg via INTRAVENOUS
  Administered 2021-05-10: 7 mg via INTRAVENOUS
  Administered 2021-05-10: 6 mg via INTRAVENOUS
  Administered 2021-05-10: 8 mg via INTRAVENOUS
  Administered 2021-05-10: 4 mg via INTRAVENOUS
  Administered 2021-05-10: 9 mg via INTRAVENOUS
  Administered 2021-05-11: 7 mg via INTRAVENOUS
  Administered 2021-05-11: 9 mL via INTRAVENOUS
  Administered 2021-05-11: 7 mg via INTRAVENOUS
  Filled 2021-05-07 (×5): qty 30

## 2021-05-07 MED ORDER — HYDROMORPHONE 1 MG/ML IV SOLN: INTRAVENOUS | Status: DC

## 2021-05-07 NOTE — Progress Notes (Signed)
° °  Palliative Medicine Inpatient Follow Up Note     I saw and examined Randall Larsen.  He was sitting in chair at time of my encounter, however, he was sitting on the edge of the chair and did not appear to be comfortable Larsen.  He had a friend at the bedside Larsen as well.  He feels his pain is a little bit worse Larsen and he has not been able to get comfortable.  Still the same sort of pain with sharp onset with certain movement but he just feels it is happening more frequently Larsen.  He has been having to stack multiple doses from the PCA in order to get relief.  I queried his PCA and he has used 30 mg of Dilaudid in the last 24 hours.   We discussed that since he is needing to stack multiple doses to get any kind of relief, we would increase his PCA dose but extend the dosing interval between when he can hit the button slightly.  He was in agreement with this.  Questions addressed and support provided.    Objective Assessment: Vital Signs Vitals:   05/07/21 1520 05/07/21 1743  BP: (!) 154/68   Pulse: 85   Resp: 18 17  Temp: 99.4 F (37.4 C)   SpO2: 97% 96%    Intake/Output Summary (Last 24 hours) at 05/07/2021 1907 Last data filed at 05/07/2021 1400 Gross per 24 hour  Intake 870.5 ml  Output 600 ml  Net 270.5 ml   Last Weight  Most recent update: 05/01/2021  8:40 PM    Weight  99.1 kg (218 lb 7.6 oz)            Gen:  NAD CV: Regular rate and rhythm, no murmurs rubs or gallops PULM: clear to auscultation bilaterally. No wheezes/rales/rhonchi ABD: soft/nontender/nondistended/normal bowel sounds EXT: bilateral lower extremity edema  Neuro: Alert and oriented x3  SUMMARY OF RECOMMENDATIONS   Continue with current plan of care per medical team   Symptom Management:  Pain related to neoplasm/metastatic disease PCA queried and he is still using significant amount of pain medication.  He has utilized 30 mg of IV Dilaudid in the last 24 hours which is actually  higher than the 24 hours previously.  Unfortunately, incident pain from bony metastasis and this can be one of the most difficult types of pain to treat.  Continue steroids.  Will increase PCA dose to 1 mg but increase lockout interval to 15 minutes.    Bisphosphonates can also be beneficial, however, he has not received recent dental care and consideration of risk versus benefit with bisphosphonates is necessary, particularly in regards to risk for osteonecrosis of the jaw.  Continue OxyContin 30 mg every 8 hrs  Lidocaine patch every 24 hours Scheduled Tylenol 1000 mg 3 times daily Toradol 15 mg every 8 hours x 3 doses-completed  Gabapentin increased to 300 twice daily in addition to 600 mg nightly. Long-term, radiation is the best modality to hopefully help control his pain and improve his functional status.  My concern is that it may be difficult to control his pain until we have reached a point where radiation begins to take effect.  Constipation MiraLAX twice daily Senna-S twice daily as needed Colace twice daily  Dyspepsia Protonix 40 mg twice daily  Micheline Rough, MD [place] Team [phone]

## 2021-05-07 NOTE — Progress Notes (Signed)
PROGRESS NOTE  Randall Larsen. 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  He was evaluated for urgent radiation therapy and has started out on 1/26.  Palliative care consulted to assist with pain control, currently on Dilaudid PCA  Subjective / 24h Interval events: Continues to complain of pain in his back, chest, with every movement.  Finally had a bowel movement yesterday  Assessment & Plan: Principal problem Cancer associated pain  -appreciate palliative follow-up.  Currently on Dilaudid PCA, oxycodone 30 mg Q8.  He is also on steroids.  Appreciate palliative follow-up, also ongoing radiation therapy which she is receiving tomorrow  Active problems Chest pain-due to bony metastasis, not suspicious for ACS.  Essential hypertension-continue amlodipine, metoprolol, hydralazine.  Blood pressure high at times due to the pain  Metastatic prostate cancer-currently undergoing radiation therapy, will have treatment #3 tomorrow, will receive 10 fractions total  Obesity, class I-BMI 31.3   Scheduled Meds:  acetaminophen  1,000 mg Oral TID   amLODipine  10 mg Oral Daily   dexamethasone  6 mg Oral Daily   docusate sodium  100 mg Oral Q12H   furosemide  20 mg Oral Daily   gabapentin  300 mg Oral BID   gabapentin  600 mg Oral QHS   hydrALAZINE  50 mg Oral TID   HYDROmorphone   Intravenous Q4H   lidocaine  1 patch Transdermal Q24H   metoprolol tartrate  25 mg Oral BID   oxyCODONE  30 mg Oral Q8H   pantoprazole  40 mg Oral BID   polyethylene glycol  17 g Oral BID   potassium chloride  20 mEq Oral Daily   senna-docusate  2 tablet Oral BID   Continuous Infusions: PRN  Meds:.diphenhydrAMINE **OR** diphenhydrAMINE, ketorolac, naloxone **AND** sodium chloride flush, ondansetron (ZOFRAN) IV  Diet Orders (From admission, onward)     Start     Ordered   05/01/21 2105  Diet Heart Room service appropriate? Yes; Fluid consistency: Thin  Diet effective now       Question Answer Comment  Room service appropriate? Yes   Fluid consistency: Thin      05/01/21 2105            DVT prophylaxis: SCDs Start: 05/01/21 2105   Lab Results  Component Value Date   PLT 243 05/02/2021      Code Status: Full Code  Family Communication: No family at bedside  Status is: Inpatient  Remains inpatient appropriate because: Severity of pain  Level of care: Med-Surg  Consultants:  Palliative care Radiation oncology  Procedures:  none  Microbiology  none  Antimicrobials: none    Objective: Vitals:   05/07/21 0512 05/07/21 0916 05/07/21 0919 05/07/21 1021  BP:   (!) 193/82 (!) 158/75  Pulse:   (!) 101 89  Resp: 20 17 18 16   Temp:   (!) 97.5 F (36.4 C) 98.2 F (36.8 C)  TempSrc:   Oral Oral  SpO2: 100% 96% 99% 99%  Weight:      Height:        Intake/Output Summary (Last 24 hours) at 05/07/2021 1035 Last data filed at 05/07/2021 0900 Gross per 24 hour  Intake 1050.5 ml  Output 400 ml  Net 650.5 ml    Wt Readings from Last 3 Encounters:  05/01/21 99.1 kg  04/25/21 100.9 kg  04/17/21 99.6 kg    Examination:  Constitutional: NAD Eyes: lids and conjunctivae normal, no scleral icterus ENMT: mmm Neck: normal, supple Respiratory: clear to auscultation bilaterally, no wheezing, no crackles. Normal respiratory effort.  Cardiovascular: Regular rate and rhythm, no murmurs / rubs / gallops. No LE edema. Abdomen: soft, no distention, no tenderness. Bowel sounds positive.  Skin: no rashes Neurologic: no focal deficits, equal strength   Data Reviewed: I have independently reviewed following labs and imaging studies   CBC Recent Labs  Lab  05/01/21 1355 05/01/21 2116 05/02/21 0455  WBC 10.2 9.4 11.4*  HGB 11.9* 11.0* 11.5*  HCT 35.5* 32.5* 33.4*  PLT 254 213 243  MCV 89.2 88.6 89.3  MCH 29.9 30.0 30.7  MCHC 33.5 33.8 34.4  RDW 15.4 15.4 15.5  LYMPHSABS 3.0 1.6 3.0  MONOABS 1.1* 0.9 1.0  EOSABS 0.1 0.0 0.0  BASOSABS 0.1 0.1 0.1     Recent Labs  Lab 05/01/21 1355 05/01/21 2116 05/02/21 0455 05/04/21 0428 05/06/21 0513  NA 140  --  137 138 138  K 3.6  --  3.3* 3.6 4.4  CL 107  --  105 108 106  CO2 24  --  23 23 22   GLUCOSE 133*  --  130* 148* 110*  BUN 14  --  10 26* 27*  CREATININE 0.99  --  0.96 1.01 0.99  CALCIUM 9.3  --  9.6 8.7* 9.5  AST 29 36 50*  --   --   ALT 14 13 15   --   --   ALKPHOS 440* 410* 470*  --   --   BILITOT 0.5 0.7 1.1  --   --   ALBUMIN 4.1 3.9 4.1  --   --   MG  --  2.0 2.0  --  2.3  DDIMER 13.00*  --   --   --   --   TSH  --   --  2.690  --   --      ------------------------------------------------------------------------------------------------------------------ No results for input(s): CHOL, HDL, LDLCALC, TRIG, CHOLHDL, LDLDIRECT in the last 72 hours.  No results found for: HGBA1C ------------------------------------------------------------------------------------------------------------------ No results for input(s): TSH, T4TOTAL, T3FREE, THYROIDAB in the last 72 hours.  Invalid input(s): FREET3  Cardiac Enzymes No results for input(s): CKMB, TROPONINI, MYOGLOBIN in the last 168 hours.  Invalid input(s): CK ------------------------------------------------------------------------------------------------------------------    Component Value Date/Time   BNP 61.9 02/08/2021 0730    CBG: No results for input(s): GLUCAP in the last 168 hours.  Recent Results (from the past 240 hour(s))  Resp Panel by RT-PCR (Flu A&B, Covid) Nasopharyngeal Swab     Status: None   Collection Time: 05/01/21  7:02 PM   Specimen: Nasopharyngeal Swab; Nasopharyngeal(NP) swabs in vial  transport medium  Result Value Ref Range Status   SARS Coronavirus 2 by RT PCR NEGATIVE NEGATIVE Final    Comment: (NOTE) SARS-CoV-2 target nucleic acids are NOT DETECTED.  The SARS-CoV-2 RNA is generally detectable in upper respiratory specimens during the acute phase of infection. The lowest concentration of SARS-CoV-2 viral copies this assay can detect is 138 copies/mL. A negative result does not preclude SARS-Cov-2 infection and should not be used as the sole basis for treatment or other patient management decisions. A negative result may occur with  improper specimen collection/handling, submission of specimen other than nasopharyngeal swab, presence of  viral mutation(s) within the areas targeted by this assay, and inadequate number of viral copies(<138 copies/mL). A negative result must be combined with clinical observations, patient history, and epidemiological information. The expected result is Negative.  Fact Sheet for Patients:  EntrepreneurPulse.com.au  Fact Sheet for Healthcare Providers:  IncredibleEmployment.be  This test is no t yet approved or cleared by the [place] FDA and  has been authorized for detection and/or diagnosis of SARS-CoV-2 by FDA under an Emergency Use Authorization (EUA). This EUA will remain  in effect (meaning this test can be used) for the duration of the COVID-19 declaration under Section 564(b)(1) of the Act, 21 U.S.C.section 360bbb-3(b)(1), unless the authorization is terminated  or revoked sooner.       Influenza A by PCR NEGATIVE NEGATIVE Final   Influenza B by PCR NEGATIVE NEGATIVE Final    Comment: (NOTE) The Xpert Xpress SARS-CoV-2/FLU/RSV plus assay is intended as an aid in the diagnosis of influenza from Nasopharyngeal swab specimens and should not be used as a sole basis for treatment. Nasal washings and aspirates are unacceptable for Xpert Xpress SARS-CoV-2/FLU/RSV testing.  Fact  Sheet for Patients: EntrepreneurPulse.com.au  Fact Sheet for Healthcare Providers: IncredibleEmployment.be  This test is not yet approved or cleared by the [place] FDA and has been authorized for detection and/or diagnosis of SARS-CoV-2 by FDA under an Emergency Use Authorization (EUA). This EUA will remain in effect (meaning this test can be used) for the duration of the COVID-19 declaration under Section 564(b)(1) of the Act, 21 U.S.C. section 360bbb-3(b)(1), unless the authorization is terminated or revoked.  Performed at [facility], [place] [address], [place], [place] [address]      Radiology Studies: No results found.   Marzetta Board, MD, PhD [facility]  Between 7 am - 7 pm I am available, please contact me via Amion (for emergencies) or Securechat (non urgent messages)  Between 7 pm - 7 am I am not available, please contact night coverage MD/APP via Amion

## 2021-05-07 NOTE — Plan of Care (Signed)
°  Problem: Education: Goal: Knowledge of General Education information will improve Description: Including pain rating scale, medication(s)/side effects and non-pharmacologic comfort measures Outcome: Progressing   Problem: Clinical Measurements: Goal: Diagnostic test results will improve Outcome: Progressing   Problem: Activity: Goal: Risk for activity intolerance will decrease Outcome: Progressing   Problem: Nutrition: Goal: Adequate nutrition will be maintained Outcome: Progressing   Problem: Elimination: Goal: Will not experience complications related to bowel motility Outcome: Progressing Goal: Will not experience complications related to urinary retention Outcome: Progressing   Problem: Pain Managment: Goal: General experience of comfort will improve Outcome: Progressing

## 2021-05-07 NOTE — Progress Notes (Signed)
Palliative Medicine Inpatient Follow Up Note     I saw and examined Mr. Bagot today.  At time of encounter, he was sitting in bedside chair with dinner tray in front of him.  We discussed his pain and he continues to relay multiple episodes of severe incident pain.  I queried his PCA and he has used 23 mg of Dilaudid in the last 24 hours.  Discussed plan to continue with PCA.    We also discussed other potential interventions including bisphosphonates.  He reports that he has no current dental problems that he is aware of, however, it has been more than 5 years since his last trip to the dentist.  He does report having multiple dental procedures in the past the last being around 7 years ago.  I shared with him that I think the reality of the situation based upon his use over the past 24 hours is that he may need to receive more radiation to see if pain gets better controlled before we attempt to wean off of PCA.  Unfortunately, but incident pain related to bony metastasis is difficult to treat effectively with oral opioids due to the fact the pain is so sudden in onset and can be gone before oral meds have a chance to take effect.  Questions addressed and support provided.    Objective Assessment: Vital Signs Vitals:   05/07/21 0455 05/07/21 0512  BP: (!) 162/90   Pulse: 88   Resp: 20 20  Temp: 97.8 F (36.6 C)   SpO2: 100% 100%    Intake/Output Summary (Last 24 hours) at 05/07/2021 0844 Last data filed at 05/07/2021 0600 Gross per 24 hour  Intake 1050.5 ml  Output 200 ml  Net 850.5 ml   Last Weight  Most recent update: 05/01/2021  8:40 PM    Weight  99.1 kg (218 lb 7.6 oz)            Gen:  NAD CV: Regular rate and rhythm, no murmurs rubs or gallops PULM: clear to auscultation bilaterally. No wheezes/rales/rhonchi ABD: soft/nontender/nondistended/normal bowel sounds EXT: bilateral lower extremity edema  Neuro: Alert and oriented x3  SUMMARY OF RECOMMENDATIONS    Continue with current plan of care per medical team   Symptom Management:  Pain related to neoplasm/metastatic disease PCA queried and he is still using significant amount of pain medication.  He has utilized 23 mg of IV Dilaudid in the last 24 hours.  We increased his long-acting medication yesterday and he has still required the same amount of short acting medication.  Unfortunately, most this is related to incident pain from bony metastasis and this can be one of the most difficult types of pain to treat.  Continue steroids.  Bisphosphonates can also be beneficial, however, he has not received recent dental care and consideration of risk versus benefit with bisphosphonates is necessary, particularly in regards to risk for osteonecrosis of the jaw.  Continue OxyContin 30 mg every 8 hrs  Lidocaine patch every 24 hours Scheduled Tylenol 1000 mg 3 times daily Toradol 15 mg every 8 hours x 3 doses-completed  Gabapentin increased to 300 twice daily in addition to 600 mg nightly. Long-term, radiation is the best modality to hopefully help control his pain and improve his functional status.  My concern is that it may be difficult to control his pain until we have reached a point where radiation begins to take effect.  Constipation MiraLAX twice daily Senna-S twice daily as needed Colace twice  daily  Dyspepsia Protonix 40 mg twice daily  Micheline Rough, MD [facility] Team [phone]

## 2021-05-08 ENCOUNTER — Ambulatory Visit
Admit: 2021-05-08 | Discharge: 2021-05-08 | Disposition: A | Discharge status: Home / Self Care | Payer: Medicare HMO | Attending: Radiation Oncology | Admitting: Radiation Oncology

## 2021-05-08 DIAGNOSIS — C7951 Secondary malignant neoplasm of bone: Secondary | ICD-10-CM | POA: Diagnosis not present

## 2021-05-08 DIAGNOSIS — Z515 Encounter for palliative care: Secondary | ICD-10-CM | POA: Diagnosis not present

## 2021-05-08 DIAGNOSIS — C61 Malignant neoplasm of prostate: Secondary | ICD-10-CM | POA: Diagnosis not present

## 2021-05-08 DIAGNOSIS — G893 Neoplasm related pain (acute) (chronic): Secondary | ICD-10-CM | POA: Diagnosis not present

## 2021-05-08 LAB — CBC
HCT: 30 % — ABNORMAL LOW (ref 39.0–52.0)
Hemoglobin: 9.8 g/dL — ABNORMAL LOW (ref 13.0–17.0)
MCH: 30.1 pg (ref 26.0–34.0)
MCHC: 32.7 g/dL (ref 30.0–36.0)
MCV: 92 fL (ref 80.0–100.0)
Platelets: 238 10*3/uL (ref 150–400)
RBC: 3.26 MIL/uL — ABNORMAL LOW (ref 4.22–5.81)
RDW: 16.6 % — ABNORMAL HIGH (ref 11.5–15.5)
WBC: 10 10*3/uL (ref 4.0–10.5)
nRBC: 4.9 % — ABNORMAL HIGH (ref 0.0–0.2)

## 2021-05-08 LAB — BASIC METABOLIC PANEL
Anion gap: 8 (ref 5–15)
BUN: 37 mg/dL — ABNORMAL HIGH (ref 8–23)
CO2: 23 mmol/L (ref 22–32)
Calcium: 9.3 mg/dL (ref 8.9–10.3)
Chloride: 106 mmol/L (ref 98–111)
Creatinine, Ser: 1.07 mg/dL (ref 0.61–1.24)
GFR, Estimated: >60 mL/min (ref >60)
Glucose, Bld: 110 mg/dL — ABNORMAL HIGH (ref 70–99)
Potassium: 4.3 mmol/L (ref 3.5–5.1)
Sodium: 137 mmol/L (ref 135–145)

## 2021-05-08 NOTE — Progress Notes (Signed)
PROGRESS NOTE    Randall Larsen.  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  Patient was evaluated for urgent radiation therapy and has started radiation treatment on 05/04/2021.  Palliative care consulted to assist with the pain control currently on Dilaudid PCA.  Assessment & Plan:   Principal Problem:   Cancer associated pain Active Problems:   Prostate cancer ([place])   Bone metastases ([place])   Essential hypertension   Chest pain   Edema  Cancer related pain: Patient with prostate cancer with metastasis to the bone. Palliative care consulted, appreciate recommendation. Continue Dilaudid PCA, Continue oxycodone 30 mg every 8 hours. Continue dexamethasone. Continue radiation therapy for ongoing pain.  Chest pain, Musculoskeletal: Likely due to bony metastasis.  Not suspicious for ACS. EKG unremarkable.  Essential hypertension: Continue amlodipine, metoprolol, hydralazine. BP remains elevated intermittently due to pain.  Metastatic prostate cancer Continue ongoing radiation therapy. Patient is going to get treatment # 3 today.  Obesity class I: BMI 31.3. Diet and exercise discussed:  DVT prophylaxis: SCDs Code Status: Full code. Family Communication: No family at bed side. Disposition Plan:   Status is: Inpatient  Remains inpatient appropriate because:  Patient with metastatic prostate cancer to the bone presented with intractable pain.  Palliative care is helping with pain management,  Patient is on Dilaudid PCA.  Anticipated discharge to skilled nursing facility once pain is manageable with oral pain medications.  Consultants:  Oncology Palliative care  Procedures:  Dilaudid PCA Antimicrobials:  Anti-infectives (From admission, onward)    None       Subjective: Patient was seen and examined at bedside.  Overnight events noted.   Patient was sitting on the chair, reports pain is not controlled.  His PCA frequency was increased.  Objective: Vitals:   05/07/21 2353 05/08/21 0410 05/08/21 0429 05/08/21 0835  BP:   (!) 157/83   Pulse:   79   Resp: 20 19 14 14   Temp:   98 F (36.7 C)   TempSrc:   Oral   SpO2: 97% 98% 98% 94%  Weight:      Height:        Intake/Output Summary (Last 24 hours) at 05/08/2021 1224 Last data filed at 05/08/2021 1045 Gross per 24 hour  Intake 660 ml  Output 1350 ml  Net -690 ml   Filed Weights   05/01/21 2023  Weight: 99.1 kg    Examination:  General exam: Appears comfortable, chronically ill looking, not in any distress. Respiratory system: Clear to auscultation bilaterally, respiratory effort normal, RR 15. Cardiovascular system: S1-S2 heard, regular rate and rhythm, no murmur. Gastrointestinal system: Abdomen is soft, nontender, nondistended, BS+ Central nervous system: Alert and oriented X 2. No focal neurological deficits. Extremities: No edema, no cyanosis, no clubbing. Skin: No rashes, lesions or ulcers Psychiatry: Judgement and insight appear normal. Mood & affect appropriate.     Data Reviewed: I have personally reviewed following labs and imaging studies  CBC: Recent Labs  Lab 05/01/21 1355 05/01/21 2116 05/02/21 0455 05/08/21 0434  WBC 10.2 9.4 11.4* 10.0  NEUTROABS 5.8 6.5 6.8  --   HGB 11.9* 11.0* 11.5* 9.8*  HCT 35.5* 32.5* 33.4* 30.0*  MCV 89.2 88.6 89.3 92.0  PLT 254 213 243 885   Basic Metabolic Panel: Recent Labs  Lab 05/01/21 1355 05/01/21 2116 05/02/21 0455 05/04/21 0428 05/06/21 0513 05/08/21 0434  NA 140  --  137 138 138 137  K 3.6  --  3.3* 3.6 4.4 4.3  CL 107  --  105 108 106 106  CO2 24  --  23 23 22 23   GLUCOSE 133*  --  130* 148* 110* 110*  BUN 14  --   10 26* 27* 37*  CREATININE 0.99  --  0.96 1.01 0.99 1.07  CALCIUM 9.3  --  9.6 8.7* 9.5 9.3  MG  --  2.0 2.0  --  2.3  --   PHOS  --  3.0 3.4  --   --   --    GFR: Estimated Creatinine Clearance: 80.1 mL/min (by C-G formula based on SCr of 1.07 mg/dL). Liver Function Tests: Recent Labs  Lab 05/01/21 1355 05/01/21 2116 05/02/21 0455  AST 29 36 50*  ALT 14 13 15   ALKPHOS 440* 410* 470*  BILITOT 0.5 0.7 1.1  PROT 7.6 7.0 7.6  ALBUMIN 4.1 3.9 4.1   No results for input(s): LIPASE, AMYLASE in the last 168 hours. No results for input(s): AMMONIA in the last 168 hours. Coagulation Profile: No results for input(s): INR, PROTIME in the last 168 hours. Cardiac Enzymes: Recent Labs  Lab 05/01/21 2116  CKTOTAL 105   BNP (last 3 results) No results for input(s): PROBNP in the last 8760 hours. HbA1C: No results for input(s): HGBA1C in the last 72 hours. CBG: No results for input(s): GLUCAP in the last 168 hours. Lipid Profile: No results for input(s): CHOL, HDL, LDLCALC, TRIG, CHOLHDL, LDLDIRECT in the last 72 hours. Thyroid Function Tests: No results for input(s): TSH, T4TOTAL, FREET4, T3FREE, THYROIDAB in the last 72 hours. Anemia Panel: No results for input(s): VITAMINB12, FOLATE, FERRITIN, TIBC, IRON, RETICCTPCT in the last 72 hours. Sepsis Labs: No results for input(s): PROCALCITON, LATICACIDVEN in the last 168 hours.  Recent Results (from the past 240 hour(s))  Resp Panel by RT-PCR (Flu A&B, Covid) Nasopharyngeal Swab     Status: None   Collection Time: 05/01/21  7:02 PM   Specimen: Nasopharyngeal Swab; Nasopharyngeal(NP) swabs in vial transport medium  Result Value Ref Range Status   SARS Coronavirus 2 by RT PCR NEGATIVE NEGATIVE Final    Comment: (NOTE) SARS-CoV-2 target nucleic acids are NOT DETECTED.  The SARS-CoV-2 RNA is generally detectable in upper respiratory specimens during the acute phase of infection. The lowest concentration of SARS-CoV-2 viral copies  this assay can detect is 138 copies/mL. A negative result does not preclude SARS-Cov-2 infection and should not be used as the sole basis for treatment or other patient management decisions. A negative result may occur with  improper specimen collection/handling, submission of specimen other than nasopharyngeal swab, presence of viral mutation(s) within the areas targeted by this assay, and inadequate number of viral copies(<138 copies/mL). A negative result must be combined with clinical observations, patient history, and epidemiological information. The expected result is Negative.  Fact Sheet for Patients:  EntrepreneurPulse.com.au  Fact Sheet for Healthcare Providers:  IncredibleEmployment.be  This test is no t yet approved or cleared by the [place] FDA and  has been authorized for detection and/or diagnosis of SARS-CoV-2 by FDA under an Emergency Use Authorization (EUA). This EUA will remain  in effect (meaning this test can be used) for the duration of the COVID-19 declaration under Section 564(b)(1)  of the Act, 21 U.S.C.section 360bbb-3(b)(1), unless the authorization is terminated  or revoked sooner.       Influenza A by PCR NEGATIVE NEGATIVE Final   Influenza B by PCR NEGATIVE NEGATIVE Final    Comment: (NOTE) The Xpert Xpress SARS-CoV-2/FLU/RSV plus assay is intended as an aid in the diagnosis of influenza from Nasopharyngeal swab specimens and should not be used as a sole basis for treatment. Nasal washings and aspirates are unacceptable for Xpert Xpress SARS-CoV-2/FLU/RSV testing.  Fact Sheet for Patients: EntrepreneurPulse.com.au  Fact Sheet for Healthcare Providers: IncredibleEmployment.be  This test is not yet approved or cleared by the [place] FDA and has been authorized for detection and/or diagnosis of SARS-CoV-2 by FDA under an Emergency Use Authorization (EUA). This EUA  will remain in effect (meaning this test can be used) for the duration of the COVID-19 declaration under Section 564(b)(1) of the Act, 21 U.S.C. section 360bbb-3(b)(1), unless the authorization is terminated or revoked.  Performed at [facility], [place] [address], [place], [place] [address]     Radiology Studies: No results found.  Scheduled Meds:  acetaminophen  1,000 mg Oral TID   amLODipine  10 mg Oral Daily   dexamethasone  6 mg Oral Daily   docusate sodium  100 mg Oral Q12H   furosemide  20 mg Oral Daily   gabapentin  300 mg Oral BID   gabapentin  600 mg Oral QHS   hydrALAZINE  50 mg Oral TID   HYDROmorphone   Intravenous Q4H   lidocaine  1 patch Transdermal Q24H   metoprolol tartrate  25 mg Oral BID   oxyCODONE  30 mg Oral Q8H   pantoprazole  40 mg Oral BID   polyethylene glycol  17 g Oral BID   potassium chloride  20 mEq Oral Daily   senna-docusate  2 tablet Oral BID   Continuous Infusions:   LOS: 6 days    Time spent: 50 mins    Naziyah Tieszen, MD [facility]   If 7PM-7AM, please contact night-coverage

## 2021-05-08 NOTE — Progress Notes (Signed)
PT Cancellation Note  Patient Details Name: Randall Larsen. MRN: [id number] DOB: 11/18/1955   Cancelled Treatment:    Reason Eval/Treat Not Completed: Pain limiting ability to participate;Patient at procedure or test/unavailable (radiation)   Kati L Payson 05/08/2021, 11:59 AM Arlyce Dice, DPT Acute Rehabilitation Services Pager: [phone] Office: [phone]

## 2021-05-08 NOTE — TOC Progression Note (Signed)
Transition of Care (TOC) - Progression Note    Patient Details  Name: Randall Larsen. MRN: [id number] Date of Birth: Apr 05, 1956  Transition [facility] CM/SW Contact  [name], [name], RN Phone Number: 05/08/2021, 11:50 AM  Clinical Narrative:  Spoke to patient again about SNF bed choice-he will let me know since his pain is still a concern that he wants resolved, radiation treatments completed prior to  leaving hospital. Oncology/MD updated.    Expected Discharge Plan: [place] Barriers to Discharge: Continued Medical Work up  Expected Discharge Plan and Services Expected Discharge Plan: [place]   Discharge Planning Services: CM Consult   Living arrangements for the past 2 months: Single Family Home                                       Social Determinants of Health (SDOH) Interventions    Readmission Risk Interventions No flowsheet data found.

## 2021-05-08 NOTE — Progress Notes (Signed)
OT Cancellation Note  Patient Details Name: Randall Larsen. MRN: [id number] DOB: [birth year]   Cancelled Treatment:    Reason Eval/Treat Not Completed: Other (comment) Patients pain was impacting participation in session on this date. Patient unable to participate in AE training on this date. OT to continue to follow and check back as schedule will allow. Jackelyn Poling OTR/L, [place] Acute Rehabilitation Department Office# [phone] Pager# [phone]   05/08/2021, 11:34 AM

## 2021-05-09 ENCOUNTER — Ambulatory Visit: Payer: Medicare HMO

## 2021-05-09 DIAGNOSIS — C7951 Secondary malignant neoplasm of bone: Secondary | ICD-10-CM | POA: Diagnosis not present

## 2021-05-09 DIAGNOSIS — R52 Pain, unspecified: Secondary | ICD-10-CM | POA: Diagnosis not present

## 2021-05-09 DIAGNOSIS — G893 Neoplasm related pain (acute) (chronic): Secondary | ICD-10-CM | POA: Diagnosis not present

## 2021-05-09 DIAGNOSIS — K5900 Constipation, unspecified: Secondary | ICD-10-CM

## 2021-05-09 DIAGNOSIS — C61 Malignant neoplasm of prostate: Secondary | ICD-10-CM | POA: Diagnosis not present

## 2021-05-09 DIAGNOSIS — K30 Functional dyspepsia: Secondary | ICD-10-CM

## 2021-05-09 LAB — HEMOGLOBIN AND HEMATOCRIT, BLOOD
HCT: 33.4 % — ABNORMAL LOW (ref 39.0–52.0)
Hemoglobin: 11.1 g/dL — ABNORMAL LOW (ref 13.0–17.0)

## 2021-05-09 MED ORDER — METHOCARBAMOL 500 MG PO TABS
500.0000 mg | ORAL_TABLET | Freq: Three times a day (TID) | ORAL | Status: DC | PRN
  Administered 2021-05-11 – 2021-05-27 (×12): 500 mg via ORAL
  Filled 2021-05-09 (×12): qty 1

## 2021-05-09 MED ORDER — LIDOCAINE 5 % EX PTCH
2.0000 | MEDICATED_PATCH | CUTANEOUS | Status: DC
  Administered 2021-05-09 – 2021-05-30 (×20): 2 via TRANSDERMAL
  Filled 2021-05-09 (×22): qty 2

## 2021-05-09 NOTE — Progress Notes (Addendum)
Palliative Medicine Inpatient Follow Up Note     Chart reviewed and patient examined at the bedside.   Randall Larsen is sitting up in his recliner. He is tearful expressing his pain during radiation and transferring to the table. He is requesting to skip his treatment today to allow him some comfort, however emphasizes he does not wish to discontinue or jeopardize his ability to receive treatment.   We discussed at length his current pain regimen. He continues on PCA dilaudid. I have personally observed total use of PCA and he has used 36 mg of Dilaudid over the past 24 hours. We will continue to utilize PCA as previously discussed to offer support and relief as he carries out his radiation treatments.   We will continue to closely evaluate usage and make necessary adjustments to oral regimen in preparation of discharge.   Questions addressed and support provided.    Objective Assessment: Vital Signs Vitals:   05/09/21 0540 05/09/21 0811  BP: (!) 145/81   Pulse: 79   Resp: 17 15  Temp: 98.4 F (36.9 C)   SpO2: 97% 94%    Intake/Output Summary (Last 24 hours) at 05/09/2021 0931 Last data filed at 05/09/2021 0532 Gross per 24 hour  Intake 480 ml  Output 900 ml  Net -420 ml    Last Weight  Most recent update: 05/01/2021  8:40 PM    Weight  99.1 kg (218 lb 7.6 oz)            Gen:  NAD CV: Normal breathing pattern  PULM: clear to auscultation bilaterally.  ABD: soft/nontender/nondistended/normal bowel sounds EXT: bilateral lower extremity edema  Neuro: Alert and oriented x3  SUMMARY OF RECOMMENDATIONS   Continue with current plan of care per medical team  Potential SNF placement once radiation is completed.   Symptom Management:  Pain related to neoplasm/metastatic disease PCA queried and he is still using significant amount of pain medication.  He has utilized  36 mg of IV Dilaudid in the last 24 hours.  Unfortunately, he is experiencing pain from bony metastasis and  this can be one of the most difficult types of pain to treat.  Continue steroids.  Continue PCA with bolus dose of 1mg  with 15 minute lockout.    Bisphosphonates can also be beneficial, however, he has not received recent dental care and consideration of risk versus benefit with bisphosphonates is necessary, particularly in regards to risk for osteonecrosis of the jaw.  Continue OxyContin 30 mg every 8 hrs, if pain continues to be uncontrolled we may have to consider transitioning him to methadone with hopes of increased relief/effectiveness.  Lidocaine patch every 24 hours will add an additional patch to apply to areas  Scheduled Tylenol 1000 mg 3 times daily Toradol 15 mg every 8 hours x 3 doses-he expressed decreased pain when receiving. Will continue for another 48 hours before discontinuing.   Gabapentin increased to 300 twice daily in addition to 600 mg nightly. Robaxin 500mg  as needed for muscle spasm Long-term, radiation is the best modality to hopefully help control his pain and improve his functional status.  My concern is that it may be difficult to control his pain until we have reached a point where radiation begins to take effect.  Constipation MiraLAX twice daily Senna-S twice daily as needed Colace twice daily  Dyspepsia Protonix 40 mg twice daily  Time Total: 45 min.   Visit consisted of counseling and education dealing with the complex and emotionally intense  issues of symptom management and palliative care in the setting of serious and potentially life-threatening illness.Greater than 50%  of this time was spent counseling and coordinating care related to the above assessment and plan.  Alda Lea, AGPCNP-BC  Palliative Medicine Team [phone]

## 2021-05-09 NOTE — TOC Progression Note (Signed)
Transition of Care (TOC) - Progression Note    Patient Details  Name: Randall Larsen. MRN: [id number] Date of Birth: 1956-02-23  [facility] CM/SW Contact  Ziona Wickens, [name], RN Phone Number: 05/09/2021, 1:43 PM  Clinical Narrative: Patient chose [facility] in [place] for SNF-rep Valli Glance will contact patient.      Expected Discharge Plan: [place] Barriers to Discharge: Continued Medical Work up  Expected Discharge Plan and Services Expected Discharge Plan: [place]   Discharge Planning Services: CM Consult   Living arrangements for the past 2 months: Single Family Home                                       Social Determinants of Health (SDOH) Interventions    Readmission Risk Interventions No flowsheet data found.

## 2021-05-09 NOTE — Care Management Important Message (Signed)
Important Message  Patient Details IM Letter placed in Patients room. Name: Randall Larsen. MRN: [id number] Date of Birth: 09/02/1955   Medicare Important Message Given:  Yes     Kerin Salen 05/09/2021, 12:19 PM

## 2021-05-09 NOTE — Progress Notes (Signed)
PROGRESS NOTE    Randall Larsen.  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  Patient was evaluated for urgent radiation therapy and has started radiation treatment on 05/04/2021.  Palliative care consulted to assist with the pain control currently on Dilaudid PCA.  Assessment & Plan:   Principal Problem:   Cancer associated pain Active Problems:   Prostate cancer ([place])   Bone metastases ([place])   Essential hypertension   Chest pain   Edema  Cancer related pain: Patient with prostate cancer with metastasis to the bone. Palliative care consulted, appreciate recommendation. Continue Dilaudid PCA, Continue oxycodone 30 mg every 8 hours. Continue dexamethasone. Continue radiation therapy for ongoing pain.  Chest pain, Musculoskeletal: Likely due to bony metastasis.  Not suspicious for ACS. EKG unremarkable.  Essential hypertension: Continue amlodipine, metoprolol, hydralazine. BP remains elevated intermittently due to pain.  Metastatic prostate cancer Continue ongoing radiation therapy. Patient is going to get treatment # 4 today.  Obesity class I: BMI 31.3. Diet and exercise discussed.  DVT prophylaxis: SCDs Code Status: Full code. Family Communication: No family at bed side. Disposition Plan:   Status is: Inpatient  Remains inpatient appropriate because:  Patient with metastatic prostate cancer to the bone presented with intractable pain.  Palliative care is helping with pain management,  Patient is on Dilaudid PCA.  Anticipated discharge to skilled nursing facility once pain is manageable with oral pain medications.  Consultants:  Oncology Palliative care  Procedures:  Dilaudid PCA Antimicrobials:  Anti-infectives (From admission, onward)    None       Subjective: Patient was seen and examined at bedside.  Overnight events noted.   Patient was sitting on the chair, reports pain is not controlled.   Patient had radiation therapy yesterday,  states he was moved hard which caused worsening lung pain. His PCA frequency was increased.  Objective: Vitals:   05/09/21 0025 05/09/21 0427 05/09/21 0540 05/09/21 0811  BP:   (!) 145/81   Pulse:   79   Resp: 11 (!) 9 17 15   Temp:   98.4 F (36.9 C)   TempSrc:   Oral   SpO2: 94% 94% 97% 94%  Weight:      Height:        Intake/Output Summary (Last 24 hours) at 05/09/2021 1101 Last data filed at 05/09/2021 1038 Gross per 24 hour  Intake 360 ml  Output 950 ml  Net -590 ml   Filed Weights   05/01/21 2023  Weight: 99.1 kg    Examination:  General exam: Appears comfortable, deconditioned, not in any distress. Respiratory system: Clear to auscultation bilaterally, respiratory effort normal, RR 14. Cardiovascular system: S1-S2 heard, regular rate and rhythm, no murmur. Gastrointestinal system: Abdomen is soft, nontender, nondistended, BS+ Central nervous system: Alert and oriented X 2. No focal neurological deficits. Extremities: No edema, no cyanosis, no clubbing. Skin: No rashes, lesions or ulcers Psychiatry: Judgement and insight appear normal. Mood & affect appropriate.     Data Reviewed: I have personally reviewed following labs and imaging studies  CBC: Recent Labs  Lab 05/08/21 0434 05/09/21 0855  WBC 10.0  --   HGB 9.8* 11.1*  HCT 30.0* 33.4*  MCV 92.0  --  PLT 238  --    Basic Metabolic Panel: Recent Labs  Lab 05/04/21 0428 05/06/21 0513 05/08/21 0434  NA 138 138 137  K 3.6 4.4 4.3  CL 108 106 106  CO2 23 22 23   GLUCOSE 148* 110* 110*  BUN 26* 27* 37*  CREATININE 1.01 0.99 1.07  CALCIUM 8.7* 9.5 9.3  MG  --  2.3  --    GFR: Estimated Creatinine Clearance: 80.1  mL/min (by C-G formula based on SCr of 1.07 mg/dL). Liver Function Tests: No results for input(s): AST, ALT, ALKPHOS, BILITOT, PROT, ALBUMIN in the last 168 hours.  No results for input(s): LIPASE, AMYLASE in the last 168 hours. No results for input(s): AMMONIA in the last 168 hours. Coagulation Profile: No results for input(s): INR, PROTIME in the last 168 hours. Cardiac Enzymes: No results for input(s): CKTOTAL, CKMB, CKMBINDEX, TROPONINI in the last 168 hours.  BNP (last 3 results) No results for input(s): PROBNP in the last 8760 hours. HbA1C: No results for input(s): HGBA1C in the last 72 hours. CBG: No results for input(s): GLUCAP in the last 168 hours. Lipid Profile: No results for input(s): CHOL, HDL, LDLCALC, TRIG, CHOLHDL, LDLDIRECT in the last 72 hours. Thyroid Function Tests: No results for input(s): TSH, T4TOTAL, FREET4, T3FREE, THYROIDAB in the last 72 hours. Anemia Panel: No results for input(s): VITAMINB12, FOLATE, FERRITIN, TIBC, IRON, RETICCTPCT in the last 72 hours. Sepsis Labs: No results for input(s): PROCALCITON, LATICACIDVEN in the last 168 hours.  Recent Results (from the past 240 hour(s))  Resp Panel by RT-PCR (Flu A&B, Covid) Nasopharyngeal Swab     Status: None   Collection Time: 05/01/21  7:02 PM   Specimen: Nasopharyngeal Swab; Nasopharyngeal(NP) swabs in vial transport medium  Result Value Ref Range Status   SARS Coronavirus 2 by RT PCR NEGATIVE NEGATIVE Final    Comment: (NOTE) SARS-CoV-2 target nucleic acids are NOT DETECTED.  The SARS-CoV-2 RNA is generally detectable in upper respiratory specimens during the acute phase of infection. The lowest concentration of SARS-CoV-2 viral copies this assay can detect is 138 copies/mL. A negative result does not preclude SARS-Cov-2 infection and should not be used as the sole basis for treatment or other patient management decisions. A negative result may occur with  improper specimen collection/handling,  submission of specimen other than nasopharyngeal swab, presence of viral mutation(s) within the areas targeted by this assay, and inadequate number of viral copies(<138 copies/mL). A negative result must be combined with clinical observations, patient history, and epidemiological information. The expected result is Negative.  Fact Sheet for Patients:  EntrepreneurPulse.com.au  Fact Sheet for Healthcare Providers:  IncredibleEmployment.be  This test is no t yet approved or cleared by the [place] FDA and  has been authorized for detection and/or diagnosis of SARS-CoV-2 by FDA under an Emergency Use Authorization (EUA). This EUA will remain  in effect (meaning this test can be used) for the duration of the COVID-19 declaration under Section 564(b)(1) of the Act, 21 U.S.C.section 360bbb-3(b)(1), unless the authorization is terminated  or revoked sooner.       Influenza A by PCR NEGATIVE NEGATIVE Final   Influenza B by PCR NEGATIVE NEGATIVE Final    Comment: (NOTE) The Xpert Xpress SARS-CoV-2/FLU/RSV plus assay is intended as an aid in the diagnosis of influenza from Nasopharyngeal swab specimens and should not be used as a sole basis for treatment. Nasal washings and aspirates are unacceptable for Xpert Xpress SARS-CoV-2/FLU/RSV testing.  Fact Sheet for Patients: EntrepreneurPulse.com.au  Fact Sheet for Healthcare Providers: IncredibleEmployment.be  This test is not yet approved or cleared by the [place] FDA and has been authorized for detection and/or diagnosis of SARS-CoV-2 by FDA under an Emergency Use Authorization (EUA). This EUA will remain in effect (meaning this test can be used) for the duration of the COVID-19 declaration under Section 564(b)(1) of the Act, 21 U.S.C. section 360bbb-3(b)(1), unless the authorization is terminated or revoked.  Performed at [facility], [place] [address] [place], [place] [address]     Radiology Studies: No results found.  Scheduled Meds:  acetaminophen  1,000 mg Oral TID   amLODipine  10 mg Oral Daily   dexamethasone  6 mg Oral Daily   docusate sodium  100 mg Oral Q12H   furosemide  20 mg Oral Daily   gabapentin  300 mg Oral BID   gabapentin  600 mg Oral QHS   hydrALAZINE  50 mg Oral TID   HYDROmorphone   Intravenous Q4H   lidocaine  2 patch Transdermal Q24H   metoprolol tartrate  25 mg Oral BID   oxyCODONE  30 mg Oral Q8H   pantoprazole  40 mg Oral BID   polyethylene glycol  17 g Oral BID   potassium chloride  20 mEq Oral Daily   senna-docusate  2 tablet Oral BID   Continuous Infusions:   LOS: 7 days    Time spent: 35 mins    Brexley Cutshaw, MD [facility]   If 7PM-7AM, please contact night-coverage

## 2021-05-09 NOTE — Progress Notes (Signed)
Occupational Therapy Treatment Patient Details Name: Randall Larsen. MRN: [id number] DOB: 03/26/1956 Today's Date: 05/09/2021   History of present illness Randall Larsen. is a 66 y.o. male with medical history significant of prostate Ca with mets to bones. Presented with chest pain related to mets on 05/01/21.   OT comments  Patient was educated on LB dressing with AE. Patient was able to demonstrate understanding of AE. Patient noted to have increased edema in BLE with education provided on importance of keeping feet elevated during the day and strategies to use to reduce pressure on back with keeping them elevated. Patient verbalized understanding. Patient continues to be limited by pain but was noted to be in better spirits today during session compared to previous day. Patient was educated extensively on proper body mechanics. Patient verbalized understanding. Patient would continue to benefit from skilled OT services at this time while admitted and after d/c to address noted deficits in order to improve overall safety and independence in ADLs.     Recommendations for follow up therapy are one component of a multi-disciplinary discharge planning process, led by the attending physician.  Recommendations may be updated based on patient status, additional functional criteria and insurance authorization.    Follow Up Recommendations  Skilled nursing-short term rehab (<3 hours/day)    Assistance Recommended at Discharge    Patient can return home with the following  A little help with walking and/or transfers;A little help with bathing/dressing/bathroom;Assistance with cooking/housework;Help with stairs or ramp for entrance   Equipment Recommendations       Recommendations for Other Services      Precautions / Restrictions Precautions Precautions: Other (comment) Precaution Comments: pain in left chest, right hip Restrictions Weight Bearing Restrictions: No       Mobility Bed  Mobility               General bed mobility comments: in chair    Transfers                         Balance Overall balance assessment: Needs assistance Sitting-balance support: No upper extremity supported, Feet supported Sitting balance-Leahy Scale: Good     Standing balance support: No upper extremity supported, Single extremity supported Standing balance-Leahy Scale: Fair                             ADL either performed or assessed with clinical judgement   ADL Overall ADL's : Needs assistance/impaired                     Lower Body Dressing: Sitting/lateral leans;With adaptive equipment Lower Body Dressing Details (indicate cue type and reason): patient was educated on AE for LBD ressing with patient able to doff socks with SUP with reacher seated in recliner. patient was educated on using reacher for LB Dressing for pants. patient unable to complet use of sock aid with edema in BLE. patient was able to complete donning sock on sock aid. patient would need bariatric sock aid.               General ADL Comments: patient was educated on proper body mechanics for sleeping on back and in sidelying with pillow placement recommendations, and sitting in chair to reduce presure on back. patient verbalized understanding.    Extremity/Trunk Assessment              Vision  Perception     Praxis      Cognition Arousal/Alertness: Awake/alert Behavior During Therapy: WFL for tasks assessed/performed Overall Cognitive Status: Within Functional Limits for tasks assessed                                          Exercises      Shoulder Instructions       General Comments Pt limited by pain - only ambulated and held further exercise due to pain and  Pt reports OT coming around 12:30.  Pt reports very painful when they lift him from bed to table for radiation treatment.  Encouraged pt to ask about stand pivot as  he thinks that would be less painful and is capable of performing.    Pertinent Vitals/ Pain       Pain Assessment Pain Assessment: Faces Faces Pain Scale: Hurts even more Pain Location: L chest to back Pain Descriptors / Indicators: Sharp, Aching Pain Intervention(s): Limited activity within patient's tolerance, Monitored during session, PCA encouraged, Repositioned  Home Living                                          Prior Functioning/Environment              Frequency  Min 2X/week        Progress Toward Goals  OT Goals(current goals can now be found in the care plan section)  Progress towards OT goals: Progressing toward goals     Plan Discharge plan remains appropriate    Co-evaluation                 AM-PAC OT "6 Clicks" Daily Activity     Outcome Measure   Help from another person eating meals?: None Help from another person taking care of personal grooming?: A Little Help from another person toileting, which includes using toliet, bedpan, or urinal?: A Little Help from another person bathing (including washing, rinsing, drying)?: A Lot Help from another person to put on and taking off regular upper body clothing?: A Little Help from another person to put on and taking off regular lower body clothing?: A Lot 6 Click Score: 17    End of Session    OT Visit Diagnosis: Other abnormalities of gait and mobility (R26.89);Pain   Activity Tolerance Patient limited by pain   Patient Left in chair;with call bell/phone within reach   Nurse Communication Mobility status        Time: 3664-4034 OT Time Calculation (min): 23 min  Charges: OT Treatments $Self Care/Home Management : 23-37 mins  Jackelyn Poling OTR/L, MS Acute Rehabilitation Department Office# [phone] Pager# [phone]   Marcellina Millin 05/09/2021, 2:46 PM

## 2021-05-09 NOTE — Progress Notes (Signed)
Physical Therapy Treatment Patient Details Name: Randall Larsen. MRN: [id number] DOB: 1955-10-24 Today's Date: 05/09/2021   History of Present Illness Kollin Udell. is a 66 y.o. male with medical history significant of prostate Ca with mets to bones. Presented with chest pain related to mets on 05/01/21.    PT Comments    Pt making gradual progress.  His main limiting factor is pain.  Current AMPAC score is 17 which indicates home, however, pt lives alone, fall risk, limited by pain, has stairs to enter, gait speed indicates fall risk, and would have difficulty with ADLs/IADLs , and is below his baseline so continue to recommend SNF.  If pt's pain improves and he is able to progress -may be able to advance to next level of care.     Recommendations for follow up therapy are one component of a multi-disciplinary discharge planning process, led by the attending physician.  Recommendations may be updated based on patient status, additional functional criteria and insurance authorization.  Follow Up Recommendations  Skilled nursing-short term rehab (<3 hours/day)     Assistance Recommended at Discharge Frequent or constant Supervision/Assistance  Patient can return home with the following A little help with walking and/or transfers;Assistance with cooking/housework;Assist for transportation;Help with stairs or ramp for entrance   Equipment Recommendations  Rolling walker (2 wheels)    Recommendations for Other Services       Precautions / Restrictions Precautions Precautions: Other (comment) Precaution Comments: pain in left chest, right hip     Mobility  Bed Mobility               General bed mobility comments: in chair    Transfers Overall transfer level: Needs assistance Equipment used: None Transfers: Sit to/from Stand Sit to Stand: Supervision           General transfer comment: cues for hand placement    Ambulation/Gait Ambulation/Gait assistance: Min  assist Gait Distance (Feet): 80 Feet Assistive device: IV Pole Gait Pattern/deviations: Step-to pattern, Decreased stride length Gait velocity: decreased     General Gait Details: Min A to guide IV pole and min guard for safety; slow speed due to pain - last 20' pt very painful requiring walking 4-5 steps resting and repeating.  Noted pt with shoulder hiked - encouraged relaxation for decreased tension on chest.  Also, encouraged slow controlled breathing in nose, out mouth as deep and fast breathing could further increase pain in ribs/chest   Stairs             Wheelchair Mobility    Modified Rankin (Stroke Patients Only)       Balance Overall balance assessment: Needs assistance Sitting-balance support: No upper extremity supported, Feet supported Sitting balance-Leahy Scale: Good     Standing balance support: No upper extremity supported, Single extremity supported Standing balance-Leahy Scale: Fair Standing balance comment: Could static stand without AD; IV pole to ambulate                            Cognition Arousal/Alertness: Awake/alert Behavior During Therapy: WFL for tasks assessed/performed Overall Cognitive Status: Within Functional Limits for tasks assessed                                          Exercises      General Comments General comments (skin  integrity, edema, etc.): Pt limited by pain - only ambulated and held further exercise due to pain and  Pt reports OT coming around 12:30.  Pt reports very painful when they lift him from bed to table for radiation treatment.  Encouraged pt to ask about stand pivot as he thinks that would be less painful and is capable of performing.      Pertinent Vitals/Pain Pain Assessment Pain Assessment: Faces Faces Pain Scale: Hurts whole lot Pain Location: L chest to back Pain Descriptors / Indicators: Sharp, Aching Pain Intervention(s): Limited activity within patient's tolerance,  Monitored during session, PCA encouraged, Relaxation    Home Living                          Prior Function            PT Goals (current goals can now be found in the care plan section) Progress towards PT goals: Progressing toward goals    Frequency    Min 2X/week      PT Plan Current plan remains appropriate    Co-evaluation              AM-PAC PT "6 Clicks" Mobility   Outcome Measure  Help needed turning from your back to your side while in a flat bed without using bedrails?: A Little Help needed moving from lying on your back to sitting on the side of a flat bed without using bedrails?: A Little Help needed moving to and from a bed to a chair (including a wheelchair)?: A Little Help needed standing up from a chair using your arms (e.g., wheelchair or bedside chair)?: A Little Help needed to walk in hospital room?: A Little Help needed climbing 3-5 steps with a railing? : A Lot 6 Click Score: 17    End of Session Equipment Utilized During Treatment: Gait belt Activity Tolerance: Patient limited by pain Patient left: with chair alarm set;in chair;with call bell/phone within reach Nurse Communication: Mobility status PT Visit Diagnosis: Muscle weakness (generalized) (M62.81);Pain;Difficulty in walking, not elsewhere classified (R26.2)     Time: 1150-1210 PT Time Calculation (min) (ACUTE ONLY): 20 min  Charges:  $Gait Training: 8-22 mins                     Abran Richard, PT Acute Rehab Services Pager [phone] [facility] [place] 05/09/2021, 12:18 PM

## 2021-05-09 NOTE — Progress Notes (Signed)
° °  Palliative Medicine Inpatient Follow Up Note     I saw and examined Randall Larsen today.  He was sitting on the edge of the bed in mild pain.  Reports that he has been having rough time with transfers to tables in radiation oncology.  I queried his PCA and he has used 28 mg of Dilaudid in the last 24 hours. Discussed again that he is likely going to need to be through majority of doses of radiation before he is going to be able to be weaned from PCA.  Questions addressed and support provided.    Objective Assessment: Vital Signs Vitals:   05/08/21 1959 05/08/21 2109  BP:  (!) 143/73  Pulse:  94  Resp: 20 17  Temp:  98.5 F (36.9 C)  SpO2: 93% 96%    Intake/Output Summary (Last 24 hours) at 05/09/2021 0026 Last data filed at 05/08/2021 1518 Gross per 24 hour  Intake 240 ml  Output 1300 ml  Net -1060 ml   Last Weight  Most recent update: 05/01/2021  8:40 PM    Weight  99.1 kg (218 lb 7.6 oz)            Gen:  NAD CV: Regular rate and rhythm, no murmurs rubs or gallops PULM: clear to auscultation bilaterally. No wheezes/rales/rhonchi ABD: soft/nontender/nondistended/normal bowel sounds EXT: bilateral lower extremity edema  Neuro: Alert and oriented x3  SUMMARY OF RECOMMENDATIONS   Continue with current plan of care per medical team   Symptom Management:  Pain related to neoplasm/metastatic disease PCA queried and he is still using significant amount of pain medication.  He has utilized 28 mg of IV Dilaudid in the last 24 hours.  Unfortunately, incident pain from bony metastasis and this can be one of the most difficult types of pain to treat.  Continue steroids.  Continue PCA with bolus dose of 1mg  with 15 minute lockout.    Bisphosphonates can also be beneficial, however, he has not received recent dental care and consideration of risk versus benefit with bisphosphonates is necessary, particularly in regards to risk for osteonecrosis of the jaw.  Continue OxyContin  30 mg every 8 hrs  Lidocaine patch every 24 hours Scheduled Tylenol 1000 mg 3 times daily Toradol 15 mg every 8 hours x 3 doses-completed  Gabapentin increased to 300 twice daily in addition to 600 mg nightly. Long-term, radiation is the best modality to hopefully help control his pain and improve his functional status.  My concern is that it may be difficult to control his pain until we have reached a point where radiation begins to take effect.  Constipation MiraLAX twice daily Senna-S twice daily as needed Colace twice daily  Dyspepsia Protonix 40 mg twice daily  Micheline Rough, MD [place] Team [phone]

## 2021-05-10 ENCOUNTER — Ambulatory Visit
Admission: RE | Admit: 2021-05-10 | Discharge: 2021-05-10 | Disposition: A | Discharge status: Home / Self Care | Payer: Medicare HMO | Source: Ambulatory Visit | Attending: Radiation Oncology | Admitting: Radiation Oncology

## 2021-05-10 DIAGNOSIS — R52 Pain, unspecified: Secondary | ICD-10-CM | POA: Diagnosis not present

## 2021-05-10 DIAGNOSIS — R079 Chest pain, unspecified: Secondary | ICD-10-CM | POA: Diagnosis not present

## 2021-05-10 DIAGNOSIS — C7951 Secondary malignant neoplasm of bone: Secondary | ICD-10-CM | POA: Diagnosis not present

## 2021-05-10 DIAGNOSIS — G893 Neoplasm related pain (acute) (chronic): Secondary | ICD-10-CM | POA: Diagnosis not present

## 2021-05-10 LAB — BASIC METABOLIC PANEL
Anion gap: 7 (ref 5–15)
BUN: 41 mg/dL — ABNORMAL HIGH (ref 8–23)
CO2: 22 mmol/L (ref 22–32)
Calcium: 9.1 mg/dL (ref 8.9–10.3)
Chloride: 108 mmol/L (ref 98–111)
Creatinine, Ser: 0.93 mg/dL (ref 0.61–1.24)
GFR, Estimated: >60 mL/min (ref >60)
Glucose, Bld: 111 mg/dL — ABNORMAL HIGH (ref 70–99)
Potassium: 4.5 mmol/L (ref 3.5–5.1)
Sodium: 137 mmol/L (ref 135–145)

## 2021-05-10 MED ORDER — METHADONE HCL 10 MG PO TABS
10.0000 mg | ORAL_TABLET | Freq: Three times a day (TID) | ORAL | Status: DC
  Administered 2021-05-11 – 2021-05-12 (×4): 10 mg via ORAL
  Filled 2021-05-10 (×4): qty 1

## 2021-05-10 MED ORDER — LORAZEPAM 0.5 MG PO TABS
0.5000 mg | ORAL_TABLET | Freq: Two times a day (BID) | ORAL | Status: DC | PRN
  Administered 2021-05-12 – 2021-05-25 (×10): 0.5 mg via ORAL
  Filled 2021-05-10 (×12): qty 1

## 2021-05-10 NOTE — Progress Notes (Signed)
PROGRESS NOTE    Randall Larsen.  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  Patient was evaluated for urgent radiation therapy and has started radiation treatment on 05/04/2021.  Palliative care consulted to assist with the pain control currently on Dilaudid PCA.  Assessment & Plan:   Principal Problem:   Cancer associated pain Active Problems:   Prostate cancer ([place])   Bone metastases ([place])   Essential hypertension   Chest pain   Edema  Cancer related pain: Patient with prostate cancer with metastasis to the bone. Palliative care consulted, appreciate recommendation. Continue Dilaudid PCA, Continue oxycodone 30 mg every 8 hours. Continue dexamethasone. Continue radiation therapy for ongoing pain, so far has completed 4 treatments.. Long-term, radiation is the best modality to help control his pain and improve his functional status.  Chest pain, Musculoskeletal: Likely due to bony metastasis.  Not suspicious for ACS. EKG unremarkable.  Essential hypertension: Continue amlodipine, metoprolol, hydralazine. BP remains elevated intermittently due to pain.  Metastatic prostate cancer Continue ongoing radiation therapy. Patient is going to get treatment # 4 today.  Obesity class I: BMI 31.3. Diet and exercise discussed.  DVT prophylaxis: SCDs Code Status: Full code. Family Communication: No family at bed side. Disposition Plan:   Status is: Inpatient  Remains inpatient appropriate because:  Patient with metastatic prostate cancer to the bone presented with intractable pain.  Palliative care is helping with pain management,  Patient is on Dilaudid PCA.  Anticipated discharge to  skilled nursing facility once pain is manageable with oral pain medications.  Consultants:  Oncology Palliative care  Procedures: Dilaudid PCA Antimicrobials:  Anti-infectives (From admission, onward)    None       Subjective: Patient was seen and examined at bedside.  Overnight events noted.   Patient was sitting on the chair,  states pain is not manageable. He has declined to go for radiation treatment yesterday because of ongoing pain. His PCA frequency was increased.  Objective: Vitals:   05/10/21 0444 05/10/21 0747 05/10/21 0754 05/10/21 0912  BP: (!) 163/77  (!) 163/86   Pulse: 89  87   Resp: 18 16 18 18   Temp: 97.8 F (36.6 C)  97.8 F (36.6 C)   TempSrc: Oral  Oral   SpO2: 96% 97% 98% 97%  Weight:      Height:        Intake/Output Summary (Last 24 hours) at 05/10/2021 1119 Last data filed at 05/10/2021 0900 Gross per 24 hour  Intake 700 ml  Output 1600 ml  Net -900 ml   Filed Weights   05/01/21 2023  Weight: 99.1 kg    Examination:  General exam: Appears deconditioned, appears in pain, deconditioned. Respiratory system: Clear to auscultation bilaterally, respiratory effort normal, RR 15. Cardiovascular system: S1-S2 heard, regular rate and rhythm, no murmur. Gastrointestinal system: Abdomen is soft, nontender, nondistended, BS+ Central nervous system: Alert and oriented X 3. No focal neurological deficits. Extremities: No edema, no cyanosis, no clubbing. Skin: No rashes, lesions or ulcers Psychiatry: Judgement and insight appear normal. Mood & affect appropriate.     Data Reviewed: I have personally reviewed following labs and imaging studies  CBC: Recent Labs  Lab 05/08/21 0434 05/09/21 0855  WBC 10.0  --   HGB 9.8* 11.1*  HCT 30.0* 33.4*  MCV 92.0  --   PLT 238  --    Basic Metabolic Panel: Recent Labs  Lab 05/04/21 0428 05/06/21 0513 05/08/21 0434 05/10/21 0440  NA 138 138 137 137  K 3.6 4.4 4.3 4.5  CL 108 106 106 108  CO2 23  22 23 22   GLUCOSE 148* 110* 110* 111*  BUN 26* 27* 37* 41*  CREATININE 1.01 0.99 1.07 0.93  CALCIUM 8.7* 9.5 9.3 9.1  MG  --  2.3  --   --    GFR: Estimated Creatinine Clearance: 92.2 mL/min (by C-G formula based on SCr of 0.93 mg/dL). Liver Function Tests: No results for input(s): AST, ALT, ALKPHOS, BILITOT, PROT, ALBUMIN in the last 168 hours.  No results for input(s): LIPASE, AMYLASE in the last 168 hours. No results for input(s): AMMONIA in the last 168 hours. Coagulation Profile: No results for input(s): INR, PROTIME in the last 168 hours. Cardiac Enzymes: No results for input(s): CKTOTAL, CKMB, CKMBINDEX, TROPONINI in the last 168 hours.  BNP (last 3 results) No results for input(s): PROBNP in the last 8760 hours. HbA1C: No results for input(s): HGBA1C in the last 72 hours. CBG: No results for input(s): GLUCAP in the last 168 hours. Lipid Profile: No results for input(s): CHOL, HDL, LDLCALC, TRIG, CHOLHDL, LDLDIRECT in the last 72 hours. Thyroid Function Tests: No results for input(s): TSH, T4TOTAL, FREET4, T3FREE, THYROIDAB in the last 72 hours. Anemia Panel: No results for input(s): VITAMINB12, FOLATE, FERRITIN, TIBC, IRON, RETICCTPCT in the last 72 hours. Sepsis Labs: No results for input(s): PROCALCITON, LATICACIDVEN in the last 168 hours.  Recent Results (from the past 240 hour(s))  Resp Panel by RT-PCR (Flu A&B, Covid) Nasopharyngeal Swab     Status: None   Collection Time: 05/01/21  7:02 PM   Specimen: Nasopharyngeal Swab; Nasopharyngeal(NP) swabs in vial transport medium  Result Value Ref Range Status   SARS Coronavirus 2 by RT PCR NEGATIVE NEGATIVE Final    Comment: (NOTE) SARS-CoV-2 target nucleic acids are NOT DETECTED.  The SARS-CoV-2 RNA is generally detectable in upper respiratory specimens during the acute phase of infection. The lowest concentration of SARS-CoV-2 viral copies this assay can detect is 138 copies/mL. A negative result does not  preclude SARS-Cov-2 infection and should not be used as the sole basis for treatment or other patient management decisions. A negative result may occur with  improper specimen collection/handling, submission of specimen other than nasopharyngeal swab, presence of viral mutation(s) within the areas targeted by this assay, and inadequate number of viral copies(<138 copies/mL). A negative result must be combined with clinical observations, patient history, and epidemiological information. The expected result is Negative.  Fact Sheet for Patients:  EntrepreneurPulse.com.au  Fact Sheet for Healthcare Providers:  IncredibleEmployment.be  This test is no t yet approved or cleared by the [place] FDA and  has been authorized for detection and/or diagnosis of SARS-CoV-2 by FDA under an Emergency Use Authorization (EUA). This EUA will remain  in effect (meaning this test can be used) for the duration of the COVID-19 declaration under Section 564(b)(1) of the Act, 21 U.S.C.section 360bbb-3(b)(1), unless the authorization is terminated  or revoked sooner.       Influenza A by PCR NEGATIVE NEGATIVE Final   Influenza B by PCR NEGATIVE NEGATIVE Final    Comment: (NOTE) The Xpert Xpress SARS-CoV-2/FLU/RSV plus assay is intended as an aid in the diagnosis of  influenza from Nasopharyngeal swab specimens and should not be used as a sole basis for treatment. Nasal washings and aspirates are unacceptable for Xpert Xpress SARS-CoV-2/FLU/RSV testing.  Fact Sheet for Patients: EntrepreneurPulse.com.au  Fact Sheet for Healthcare Providers: IncredibleEmployment.be  This test is not yet approved or cleared by the [place] FDA and has been authorized for detection and/or diagnosis of SARS-CoV-2 by FDA under an Emergency Use Authorization (EUA). This EUA will remain in effect (meaning this test can be used) for the  duration of the COVID-19 declaration under Section 564(b)(1) of the Act, 21 U.S.C. section 360bbb-3(b)(1), unless the authorization is terminated or revoked.  Performed at [facility], [place] [address], [place], [place] [address]     Radiology Studies: No results found.  Scheduled Meds:  acetaminophen  1,000 mg Oral TID   amLODipine  10 mg Oral Daily   dexamethasone  6 mg Oral Daily   docusate sodium  100 mg Oral Q12H   furosemide  20 mg Oral Daily   gabapentin  300 mg Oral BID   gabapentin  600 mg Oral QHS   hydrALAZINE  50 mg Oral TID   HYDROmorphone   Intravenous Q4H   lidocaine  2 patch Transdermal Q24H   metoprolol tartrate  25 mg Oral BID   oxyCODONE  30 mg Oral Q8H   pantoprazole  40 mg Oral BID   polyethylene glycol  17 g Oral BID   potassium chloride  20 mEq Oral Daily   senna-docusate  2 tablet Oral BID   Continuous Infusions:   LOS: 8 days    Time spent: 35 mins    Chea Malan, MD [facility]   If 7PM-7AM, please contact night-coverage

## 2021-05-10 NOTE — Progress Notes (Signed)
Palliative Medicine Inpatient Follow Up Note     Chart reviewed and patient examined at the bedside.   Mr. Biermann is sitting up in his recliner eating lunch. He recently returned from his radiation treatment. Unfortunately he was not able to tolerate the entire treatment due to pain and discomfort, despite pre-medication. He reports significant back pain that radiates to his chest causing him to feel pressure and shortness of breath. He is hopeful tomorrow will be better.   We discussed medication regimen. Mr. Jensen is tearful during conversation. His pain continues to be of his concern but he also seems to have some anticipatory anxiety with fear of pain during treatment.   He continues on PCA dilaudid. I have personally observed total use of PCA and he has used 34 mg of Dilaudid over the past 24 hours. We will continue to utilize PCA as previously discussed to offer support and relief as he carries out his radiation treatments.   We will continue to closely evaluate usage and make necessary adjustments to oral regimen in preparation of discharge.   Questions addressed and support provided.    Objective Assessment: Vital Signs Vitals:   05/10/21 1958 05/10/21 2016  BP: (!) 174/91   Pulse: 97   Resp: 13 (!) 22  Temp: 97.8 F (36.6 C)   SpO2: 97% 96%    Intake/Output Summary (Last 24 hours) at 05/10/2021 2214 Last data filed at 05/10/2021 2000 Gross per 24 hour  Intake 1510 ml  Output 2075 ml  Net -565 ml    Last Weight  Most recent update: 05/01/2021  8:40 PM    Weight  99.1 kg (218 lb 7.6 oz)            Gen:  NAD CV: Normal breathing pattern  PULM: clear to auscultation bilaterally.  ABD: soft/nontender/nondistended/normal bowel sounds EXT: bilateral lower extremity edema  Neuro: Alert and oriented x3  SUMMARY OF RECOMMENDATIONS   Continue with current plan of care per medical team  Potential SNF placement once radiation is completed.   Symptom Management:  Pain  related to neoplasm/metastatic disease PCA queried and he is still using significant amount of pain medication.  He has utilized  34 mg of IV Dilaudid in the last 24 hours.  Unfortunately, he is experiencing pain from bony metastasis and this can be one of the most difficult types of pain to treat.  Continue steroids.  Continue PCA with bolus dose of 1mg  with 15 minute lockout.    Bisphosphonates can also be beneficial, however, he has not received recent dental care and consideration of risk versus benefit with bisphosphonates is necessary, particularly in regards to risk for osteonecrosis of the jaw.  Given patient's continued high use of PCA conversions would be 113mg  of Oxycontin to convert from his IV dilaudid use. This will not be sustainable outpatient given high amounts. We will transition patient to oral methadone. Given his uncontrolled pain we will not dose reduce for cross intolerance with hopes of continued relief and a better response. We will start patient on methadone 10 mg TID.  Lidocaine patch x2 every 24 hours Scheduled Tylenol 1000 mg 3 times daily Toradol 15 mg every 8 hours x 3 doses-he expressed decreased pain when receiving. Will continue for another 48 hours before discontinuing.   Gabapentin increased to 300 twice daily in addition to 600 mg nightly. Robaxin 500mg  as needed for muscle spasm Will premedicate with robaxin prior to radiation and as needed for anxiety in  addition to 1mg  bolus of dilaudid.  If patient's pain continue to be uncontrolled may consider one time dose of ketamine.  Long-term, radiation is the best modality to hopefully help control his pain and improve his functional status.  My concern is that it may be difficult to control his pain until we have reached a point where radiation begins to take effect.  Constipation MiraLAX twice daily Senna-S twice daily as needed Colace twice daily  Dyspepsia Protonix 40 mg twice daily  Time Total: 40 min.    Visit consisted of counseling and education dealing with the complex and emotionally intense issues of symptom management and palliative care in the setting of serious and potentially life-threatening illness.Greater than 50%  of this time was spent counseling and coordinating care related to the above assessment and plan.  Alda Lea, AGPCNP-BC  Palliative Medicine Team [phone]

## 2021-05-11 ENCOUNTER — Ambulatory Visit
Admit: 2021-05-11 | Discharge: 2021-05-11 | Disposition: A | Discharge status: Home / Self Care | Payer: Medicare HMO | Attending: Radiation Oncology | Admitting: Radiation Oncology

## 2021-05-11 DIAGNOSIS — F419 Anxiety disorder, unspecified: Secondary | ICD-10-CM

## 2021-05-11 DIAGNOSIS — C61 Malignant neoplasm of prostate: Secondary | ICD-10-CM | POA: Diagnosis not present

## 2021-05-11 DIAGNOSIS — Z7189 Other specified counseling: Secondary | ICD-10-CM

## 2021-05-11 DIAGNOSIS — R52 Pain, unspecified: Secondary | ICD-10-CM | POA: Diagnosis not present

## 2021-05-11 DIAGNOSIS — G893 Neoplasm related pain (acute) (chronic): Secondary | ICD-10-CM | POA: Diagnosis not present

## 2021-05-11 DIAGNOSIS — C7951 Secondary malignant neoplasm of bone: Secondary | ICD-10-CM | POA: Diagnosis not present

## 2021-05-11 MED ORDER — HYDROMORPHONE 1 MG/ML IV SOLN
INTRAVENOUS | Status: DC
  Administered 2021-05-11: 8 mg via INTRAVENOUS
  Administered 2021-05-11: 10 mg via INTRAVENOUS
  Administered 2021-05-11 – 2021-05-12 (×2): 30 mg via INTRAVENOUS
  Administered 2021-05-12: 1 mg via INTRAVENOUS
  Administered 2021-05-12: 4 mg via INTRAVENOUS
  Administered 2021-05-12: 7 mg via INTRAVENOUS
  Administered 2021-05-13: 6 mg via INTRAVENOUS
  Administered 2021-05-13: 30 mg via INTRAVENOUS
  Administered 2021-05-13: 3 mg via INTRAVENOUS
  Administered 2021-05-13: 2 mg via INTRAVENOUS
  Administered 2021-05-13 (×2): 4 mg via INTRAVENOUS
  Administered 2021-05-13: 5.62 mg via INTRAVENOUS
  Administered 2021-05-14: 4 mg via INTRAVENOUS
  Administered 2021-05-14: 5.4 mg via INTRAVENOUS
  Administered 2021-05-14: 30 mg via INTRAVENOUS
  Administered 2021-05-14: 4 mg via INTRAVENOUS
  Administered 2021-05-14: 3 mg via INTRAVENOUS
  Administered 2021-05-15: 4 mg via INTRAVENOUS
  Administered 2021-05-15: 3 mg via INTRAVENOUS
  Administered 2021-05-15: 2 mg via INTRAVENOUS
  Administered 2021-05-15: 6 mg via INTRAVENOUS
  Administered 2021-05-15: 4 mg via INTRAVENOUS
  Administered 2021-05-16: 2 mg via INTRAVENOUS
  Administered 2021-05-16: 30 mg via INTRAVENOUS
  Administered 2021-05-16: 3 mg via INTRAVENOUS
  Administered 2021-05-16: 1 mg via INTRAVENOUS
  Administered 2021-05-17: 30 mg via INTRAVENOUS
  Administered 2021-05-17: 3 mg via INTRAVENOUS
  Administered 2021-05-17: 2 mg via INTRAVENOUS
  Filled 2021-05-11 (×6): qty 30

## 2021-05-11 NOTE — TOC Progression Note (Signed)
Transition of Care (TOC) - Progression Note    Patient Details  Name: Randall Larsen. MRN: [id number] Date of Birth: [birth year]  Transition of Care [facility]) CM/SW Contact  Amily Depp, [name], RN Phone Number: 05/11/2021, 10:13 AM  Clinical Narrative:  Will initiate auth on 2/7 for [facility] order covid on 2/7-xrt ends 2/9.     Expected Discharge Plan: [place] Barriers to Discharge: Continued Medical Work up  Expected Discharge Plan and Services Expected Discharge Plan: [place]   Discharge Planning Services: CM Consult   Living arrangements for the past 2 months: Single Family Home                                       Social Determinants of Health (SDOH) Interventions    Readmission Risk Interventions No flowsheet data found.

## 2021-05-11 NOTE — Progress Notes (Signed)
Occupational Therapy Treatment Patient Details Name: Randall Larsen. MRN: [id number] DOB: Nov 27, 1955 Today's Date: 05/11/2021   History of present illness Randall Larsen. is a 66 y.o. male with medical history significant of prostate Ca with mets to bones. Presented with chest pain related to mets on 05/01/21.   OT comments  Patient seated in chair upon arrival, wanting to practice use of [place] adaptive equipment. Patient demonstrates use of reacher to doff socks with min cues for technique. Patient ultimately needing max A to don socks using bariatric sock aid due to difficulty lifting R foot off floor enough to slide out sock aid needing assist to complete. Patient asking to try other foot tomorrow as he is fatigued and having back pain despite use of PCA pump at beginning of session.   Recommendations for follow up therapy are one component of a multi-disciplinary discharge planning process, led by the attending physician.  Recommendations may be updated based on patient status, additional functional criteria and insurance authorization.    Follow Up Recommendations  Skilled nursing-short term rehab (<3 hours/day)    Assistance Recommended at Discharge Frequent or constant Supervision/Assistance  Patient can return home with the following  A little help with walking and/or transfers;Assistance with cooking/housework;Help with stairs or ramp for entrance;A lot of help with bathing/dressing/bathroom   Equipment Recommendations  Other (comment) ([place] [facility])       Precautions / Restrictions Precautions Precautions: Other (comment) Precaution Comments: pain in left chest, right hip, back       Mobility Bed Mobility               General bed mobility comments: in chair           ADL either performed or assessed with clinical judgement   ADL Overall ADL's : Needs assistance/impaired                     Lower Body Dressing: Sitting/lateral leans;With adaptive  equipment;Maximal assistance Lower Body Dressing Details (indicate cue type and reason): Patient instructed on use of reacher and sock aid to doff/don socks. Patient able to doff socks using reacher and min cues for technique. Patient needing mod A with sock aid to don R sock and cues to lift foot as he was stepping firmly onto sock aid. Patient so fatigued and in back pain asking to try the other foot tomorrow needing total A to don L sock.                      Cognition Arousal/Alertness: Awake/alert Behavior During Therapy: WFL for tasks assessed/performed Overall Cognitive Status: Within Functional Limits for tasks assessed                                                     Pertinent Vitals/ Pain       Pain Assessment Pain Assessment: Faces Faces Pain Scale: Hurts even more Pain Location: back Pain Descriptors / Indicators: Grimacing Pain Intervention(s): PCA encouraged         Frequency  Min 2X/week        Progress Toward Goals  OT Goals(current goals can now be found in the care plan section)  Progress towards OT goals: Progressing toward goals  Acute Rehab OT Goals Patient Stated Goal: less pain OT Goal Formulation:  With patient Time For Goal Achievement: 05/17/21 Potential to Achieve Goals: Good ADL Goals Pt Will Perform Lower Body Dressing: with modified independence;sit to/from stand;with adaptive equipment Pt Will Transfer to Toilet: with modified independence;ambulating;regular height toilet;grab bars Pt Will Perform Toileting - Clothing Manipulation and hygiene: with modified independence;sit to/from stand Pt Will Perform Tub/Shower Transfer: with supervision;ambulating;shower seat;grab bars  Plan Discharge plan remains appropriate       AM-PAC OT "6 Clicks" Daily Activity     Outcome Measure   Help from another person eating meals?: None Help from another person taking care of personal grooming?: A Little Help from  another person toileting, which includes using toliet, bedpan, or urinal?: A Little Help from another person bathing (including washing, rinsing, drying)?: A Lot Help from another person to put on and taking off regular upper body clothing?: A Little Help from another person to put on and taking off regular lower body clothing?: A Lot 6 Click Score: 17    End of Session  OT Visit Diagnosis: Other abnormalities of gait and mobility (R26.89);Pain Pain - part of body:  (back)   Activity Tolerance Patient limited by pain   Patient Left in chair;with call bell/phone within reach   Nurse Communication Mobility status        Time: 8295-6213 OT Time Calculation (min): 15 min  Charges: OT General Charges $OT Visit: 1 Visit OT Treatments $Self Care/Home Management : 8-22 mins  Delbert Phenix OT OT pager: [place] 05/11/2021, 1:35 PM

## 2021-05-11 NOTE — Progress Notes (Signed)
Palliative Medicine Inpatient Follow Up Note     Chart reviewed and patient examined at the bedside.   Mr. Hewes is sitting up in his recliner. Reports he was able to sleep in the bed some last night. Pain is decreased this morning and he is comfortable in recliner. He is anxious about radiation today with anticipation of discomfort.   Education provided on pre-medicating patient 30 min prior to his treatment with hopes he will be able to tolerate complete treatment in order to gain full benefit. Orders have been placed and nurse is at the bedside. Radiation Tech, Scott also at bedside informing patient of his planned treatment time.   Appetite continues to improve. He is having daily bowel movements. Ongoing discussions regarding pain. We have transitioned him to methadone given high volume use of PCA and oral medications. He is tolerating well thus far with first dose given at 6am.   I have personally observed total use of PCA and he has used 32 mg of Dilaudid over the past 24 hours. We will continue to utilize PCA as previously discussed to offer support and relief as he carries out his radiation treatments. Patient is holding PCA button in hand expressing he waits for the light to come on so he can push it. I inquire if he is pushing it because he is having intense pain. He states he does at times but doesn't wan to not push it and then have pain later. Education provided on use with understanding we will wean to oral medication over the next several days in anticipation of discharge. His final treatment is next Thursday 2/9, the goal will be to completely have pain effectively managed by Tuesday with oral medications. Mr. Tweed verbalized understanding and appreciation of ongoing support.   We will continue to closely evaluate usage and make necessary adjustments to oral regimen in preparation of discharge.   Questions addressed and support provided.    Objective Assessment: Vital  Signs Vitals:   05/11/21 1448 05/11/21 1559  BP:  (!) 154/72  Pulse:    Resp: 20   Temp:    SpO2: 94%     Intake/Output Summary (Last 24 hours) at 05/11/2021 1702 Last data filed at 05/11/2021 1655 Gross per 24 hour  Intake 600 ml  Output 2450 ml  Net -1850 ml    Last Weight  Most recent update: 05/01/2021  8:40 PM    Weight  99.1 kg (218 lb 7.6 oz)            Gen:  NAD, appears comfortable  CV: Normal breathing pattern  PULM: clear to auscultation bilaterally.  ABD: soft/nontender/nondistended/normal bowel sounds EXT: bilateral lower extremity edema  Neuro: Alert and oriented x3  SUMMARY OF RECOMMENDATIONS   Continue with current plan of care per medical team  Potential SNF placement once radiation is completed.   Symptom Management:  Pain related to neoplasm/metastatic disease PCA queried and he is still using significant amount of pain medication.  He has utilized  32 mg of IV Dilaudid in the last 24 hours.  Unfortunately, he is experiencing pain from bony metastasis and this can be one of the most difficult types of pain to treat.  Continue steroids.  Continue PCA with adjustments made for bolus dose of 0.5 mg with 30 minute lockout.    Bisphosphonates can also be beneficial, however, he has not received recent dental care and consideration of risk versus benefit with bisphosphonates is necessary, particularly in regards to  risk for osteonecrosis of the jaw.  Given patient's continued high use of PCA conversions would be 113mg  of Oxycontin to convert from his IV dilaudid use. This will not be sustainable outpatient given high amounts. We will transition patient to oral methadone. We will not dose reduce for cross intolerance with hopes of continued relief and a better response. We will start patient on methadone 10 mg TID (he is tolerating).  Lidocaine patch x2 every 24 hours Scheduled Tylenol 1000 mg 3 times daily Toradol 15 mg every 8 hours x 3 doses-he expressed  decreased pain when receiving. Will continue for another 48 hours before discontinuing.   Gabapentin increased to 300 twice daily in addition to 600 mg nightly. Robaxin 500mg  as needed for muscle spasm Will premedicate with robaxin prior to radiation and as needed for anxiety in addition to 1mg  bolus of dilaudid.  If patient's pain continue to be uncontrolled may consider one time dose of ketamine.  Long-term, radiation is the best modality to hopefully help control his pain and improve his functional status.  My concern is that it may be difficult to control his pain until we have reached a point where radiation begins to take effect.  Constipation MiraLAX twice daily Senna-S twice daily as needed Colace twice daily  Dyspepsia Protonix 40 mg twice daily Anxiety Mr. Scarantino is exhibiting anxiety and anticipation that he will have pain and fear of pain not being controlled. Tearful expressing his concerns but appreciation of current comfort level.  Ativan 0.5mg  BID as needed for anxiety.    Time Total: 35 min.   Visit consisted of counseling and education dealing with the complex and emotionally intense issues of symptom management and palliative care in the setting of serious and potentially life-threatening illness.Greater than 50%  of this time was spent counseling and coordinating care related to the above assessment and plan.  Alda Lea, AGPCNP-BC  Palliative Medicine Team [phone]

## 2021-05-11 NOTE — Progress Notes (Signed)
PROGRESS NOTE    Randall Larsen.  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  Patient was evaluated for urgent radiation therapy and has started radiation treatment on 05/04/2021.  Palliative care consulted to assist with the pain control currently on Dilaudid PCA.  Assessment & Plan:   Principal Problem:   Cancer associated pain Active Problems:   Prostate cancer ([place])   Bone metastases ([place])   Essential hypertension   Chest pain   Edema  Cancer related pain ( Metastatic Prostate Cancer): Patient with prostate cancer with metastasis to the bone. Palliative care consulted, appreciate recommendation. Continue Dilaudid PCA, oxycodone switched with methadone 10 mg 3 times daily. Continue dexamethasone. Continue radiation therapy for ongoing pain, so far has completed 4 treatments.. Long-term, radiation is the best modality to help control his pain and improve his functional status.  Chest pain, Musculoskeletal: Likely due to bony metastasis.  Not suspicious for ACS. EKG unremarkable.  Essential hypertension: Continue amlodipine, metoprolol, hydralazine. BP remains elevated intermittently due to pain.  Metastatic prostate cancer Continue ongoing radiation therapy. Patient has completed fourth radiation treatment yesterday..  Obesity class I: BMI 31.3. Diet and exercise discussed.  DVT prophylaxis: SCDs Code Status: Full code. Family Communication: No family at bed side. Disposition Plan:   Status is: Inpatient  Remains inpatient appropriate because:  Patient with metastatic prostate cancer to the bone presented with intractable pain.  Palliative care is helping with pain  management,  Patient is on Dilaudid PCA.  Anticipated discharge to skilled nursing facility once pain is manageable with oral pain medications.  Consultants:  Oncology Palliative care  Procedures: Dilaudid PCA Antimicrobials:  Anti-infectives (From admission, onward)    None       Subjective: Patient was seen and examined at bedside.  Overnight events noted.   Patient was sitting on the chair, stated pain is not controlled. Patient reports he has not completed radiation treatment yesterday because of ongoing pain. He is continued on Dilaudid PCA, also started on methadone.  Objective: Vitals:   05/11/21 0405 05/11/21 0428 05/11/21 0633 05/11/21 0744  BP:   (!) 157/74   Pulse:  92 82   Resp: 10 16 18 20   Temp:  98 F (36.7 C) 97.9 F (36.6 C)   TempSrc:  Oral Oral   SpO2: 95% 100% 95% 95%  Weight:      Height:        Intake/Output Summary (Last 24 hours) at 05/11/2021 1025 Last data filed at 05/11/2021 0900 Gross per 24 hour  Intake 960 ml  Output 1975 ml  Net -1015 ml   Filed Weights   05/01/21 2023  Weight: 99.1 kg    Examination:  General exam: Appears deconditioned, continue to remain in pain.  Comfortable. Respiratory system: Clear to auscultation bilaterally, respiratory effort normal, RR 16. Cardiovascular system: S1-S2 heard, regular rate and rhythm, no murmur. Gastrointestinal system: Abdomen is soft, nontender, nondistended, BS+ Central nervous system: Alert and oriented X 3. No focal neurological deficits. Extremities: No edema, no cyanosis, no clubbing. Skin: No rashes, lesions or ulcers Psychiatry: Judgement and insight appear normal. Mood & affect appropriate.     Data Reviewed: I have personally reviewed following labs and imaging studies  CBC: Recent Labs  Lab 05/08/21 0434 05/09/21 0855  WBC 10.0  --   HGB 9.8* 11.1*  HCT 30.0* 33.4*  MCV 92.0  --   PLT 238  --    Basic Metabolic Panel: Recent Labs  Lab 05/06/21 0513  05/08/21 0434 05/10/21 0440  NA 138 137 137  K 4.4 4.3 4.5  CL 106 106 108  CO2 22 23 22   GLUCOSE 110* 110* 111*  BUN 27* 37* 41*  CREATININE 0.99 1.07 0.93  CALCIUM 9.5 9.3 9.1  MG 2.3  --   --    GFR: Estimated Creatinine Clearance: 92.2 mL/min (by C-G formula based on SCr of 0.93 mg/dL). Liver Function Tests: No results for input(s): AST, ALT, ALKPHOS, BILITOT, PROT, ALBUMIN in the last 168 hours.  No results for input(s): LIPASE, AMYLASE in the last 168 hours. No results for input(s): AMMONIA in the last 168 hours. Coagulation Profile: No results for input(s): INR, PROTIME in the last 168 hours. Cardiac Enzymes: No results for input(s): CKTOTAL, CKMB, CKMBINDEX, TROPONINI in the last 168 hours.  BNP (last 3 results) No results for input(s): PROBNP in the last 8760 hours. HbA1C: No results for input(s): HGBA1C in the last 72 hours. CBG: No results for input(s): GLUCAP in the last 168 hours. Lipid Profile: No results for input(s): CHOL, HDL, LDLCALC, TRIG, CHOLHDL, LDLDIRECT in the last 72 hours. Thyroid Function Tests: No results for input(s): TSH, T4TOTAL, FREET4, T3FREE, THYROIDAB in the last 72 hours. Anemia Panel: No results for input(s): VITAMINB12, FOLATE, FERRITIN, TIBC, IRON, RETICCTPCT in the last 72 hours. Sepsis Labs: No results for input(s): PROCALCITON, LATICACIDVEN in the last 168 hours.  Recent Results (from the past 240 hour(s))  Resp Panel by RT-PCR (Flu A&B, Covid) Nasopharyngeal Swab     Status: None   Collection Time: 05/01/21  7:02 PM   Specimen: Nasopharyngeal Swab; Nasopharyngeal(NP) swabs in vial transport medium  Result Value Ref Range Status   SARS Coronavirus 2 by RT PCR NEGATIVE NEGATIVE Final    Comment: (NOTE) SARS-CoV-2 target nucleic acids are NOT DETECTED.  The SARS-CoV-2 RNA is generally detectable in upper respiratory specimens during the acute phase of infection. The lowest concentration of SARS-CoV-2 viral copies this assay  can detect is 138 copies/mL. A negative result does not preclude SARS-Cov-2 infection and should not be used as the sole basis for treatment or other patient management decisions. A negative result may occur with  improper specimen collection/handling, submission of specimen other than nasopharyngeal swab, presence of viral mutation(s) within the areas targeted by this assay, and inadequate number of viral copies(<138 copies/mL). A negative result must be combined with clinical observations, patient history, and epidemiological information. The expected result is Negative.  Fact Sheet for Patients:  EntrepreneurPulse.com.au  Fact Sheet for Healthcare Providers:  IncredibleEmployment.be  This test is no t yet approved or cleared by the [place] FDA and  has been authorized for detection and/or diagnosis of SARS-CoV-2 by FDA under an Emergency Use Authorization (EUA). This EUA will remain  in effect (meaning this test can be used) for the duration of the COVID-19 declaration under Section 564(b)(1) of the Act, 21 U.S.C.section 360bbb-3(b)(1), unless the authorization is terminated  or revoked sooner.       Influenza A by PCR NEGATIVE NEGATIVE Final   Influenza B by PCR NEGATIVE NEGATIVE Final    Comment: (NOTE) The Xpert Xpress SARS-CoV-2/FLU/RSV plus assay is intended as an aid in the diagnosis of influenza from Nasopharyngeal  swab specimens and should not be used as a sole basis for treatment. Nasal washings and aspirates are unacceptable for Xpert Xpress SARS-CoV-2/FLU/RSV testing.  Fact Sheet for Patients: EntrepreneurPulse.com.au  Fact Sheet for Healthcare Providers: IncredibleEmployment.be  This test is not yet approved or cleared by the [place] FDA and has been authorized for detection and/or diagnosis of SARS-CoV-2 by FDA under an Emergency Use Authorization (EUA). This EUA will  remain in effect (meaning this test can be used) for the duration of the COVID-19 declaration under Section 564(b)(1) of the Act, 21 U.S.C. section 360bbb-3(b)(1), unless the authorization is terminated or revoked.  Performed at [facility], [place] [address], [place], [place] [address]     Radiology Studies: No results found.  Scheduled Meds:  acetaminophen  1,000 mg Oral TID   amLODipine  10 mg Oral Daily   dexamethasone  6 mg Oral Daily   docusate sodium  100 mg Oral Q12H   furosemide  20 mg Oral Daily   gabapentin  300 mg Oral BID   gabapentin  600 mg Oral QHS   hydrALAZINE  50 mg Oral TID   HYDROmorphone   Intravenous Q4H   lidocaine  2 patch Transdermal Q24H   methadone  10 mg Oral Q8H   metoprolol tartrate  25 mg Oral BID   pantoprazole  40 mg Oral BID   polyethylene glycol  17 g Oral BID   potassium chloride  20 mEq Oral Daily   senna-docusate  2 tablet Oral BID   Continuous Infusions:   LOS: 9 days    Time spent: 35 mins    Randall Hoque, MD [facility]   If 7PM-7AM, please contact night-coverage

## 2021-05-11 NOTE — Progress Notes (Incomplete)
Histology and Location of Primary Cancer: Prostate Ca with metastasis to bone  Sites of Visceral and Bony Metastatic Disease: Left Spine, Right Hip, Sternum, and Left rib  Location(s) of Symptomatic Metastases:  Left Spine, Right Hip, Sternum, and Left rib  Past/Anticipated chemotherapy by medical oncology, if any: ***  Pain on a scale of 0-10 is:     If Spine Met(s), symptoms, if any, include: Bowel/Bladder retention or incontinence (please describe): *** Numbness or weakness in extremities (please describe): *** Current Decadron regimen, if applicable: Decadron 6 mg daily.  Ambulatory status? Walker? Wheelchair?:  Wheelchair  SAFETY ISSUES: Prior radiation? {:18581} Pacemaker/ICD? {:18581} Possible current pregnancy? Male Is the patient on methotrexate? {:18581}  Current Complaints / other details:

## 2021-05-12 ENCOUNTER — Inpatient Hospital Stay (HOSPITAL_COMMUNITY): Payer: Medicare HMO

## 2021-05-12 ENCOUNTER — Ambulatory Visit: Payer: Medicare HMO

## 2021-05-12 DIAGNOSIS — G893 Neoplasm related pain (acute) (chronic): Secondary | ICD-10-CM | POA: Diagnosis not present

## 2021-05-12 DIAGNOSIS — R609 Edema, unspecified: Secondary | ICD-10-CM

## 2021-05-12 DIAGNOSIS — R079 Chest pain, unspecified: Secondary | ICD-10-CM

## 2021-05-12 DIAGNOSIS — C7951 Secondary malignant neoplasm of bone: Secondary | ICD-10-CM | POA: Diagnosis not present

## 2021-05-12 DIAGNOSIS — R52 Pain, unspecified: Secondary | ICD-10-CM | POA: Diagnosis not present

## 2021-05-12 LAB — ECHOCARDIOGRAM COMPLETE
AV Peak grad: 10.1 mmHg
Ao pk vel: 1.59 m/s
Height: 70 in
S' Lateral: 3.7 cm
Weight: 3495.61 oz

## 2021-05-12 MED ORDER — HYDROMORPHONE HCL 2 MG PO TABS
4.0000 mg | ORAL_TABLET | ORAL | Status: DC | PRN
  Administered 2021-05-12 – 2021-05-17 (×5): 4 mg via ORAL
  Filled 2021-05-12 (×5): qty 2

## 2021-05-12 MED ORDER — METHADONE HCL 10 MG PO TABS
15.0000 mg | ORAL_TABLET | Freq: Three times a day (TID) | ORAL | Status: DC
  Administered 2021-05-12 – 2021-05-18 (×19): 15 mg via ORAL
  Filled 2021-05-12 (×19): qty 2

## 2021-05-12 MED ORDER — HYDROMORPHONE BOLUS VIA INFUSION
1.0000 mg | Freq: Every day | INTRAVENOUS | Status: DC
  Filled 2021-05-12: qty 1

## 2021-05-12 MED ORDER — HYDROMORPHONE HCL 1 MG/ML IJ SOLN
1.0000 mg | Freq: Two times a day (BID) | INTRAMUSCULAR | Status: DC | PRN
  Administered 2021-05-12: 2 mg via INTRAVENOUS
  Administered 2021-05-15 (×2): 1 mg via INTRAVENOUS
  Administered 2021-05-18: 2 mg via INTRAVENOUS
  Filled 2021-05-12: qty 1
  Filled 2021-05-12: qty 2
  Filled 2021-05-12: qty 1

## 2021-05-12 MED ORDER — HYDROMORPHONE HCL 1 MG/ML IJ SOLN
1.0000 mg | Freq: Every day | INTRAMUSCULAR | Status: DC
  Administered 2021-05-12: 1 mg via INTRAVENOUS

## 2021-05-12 MED ORDER — CELECOXIB 200 MG PO CAPS
200.0000 mg | ORAL_CAPSULE | Freq: Two times a day (BID) | ORAL | Status: DC
  Administered 2021-05-12 – 2021-05-30 (×37): 200 mg via ORAL
  Filled 2021-05-12 (×38): qty 1

## 2021-05-12 MED ORDER — FUROSEMIDE 10 MG/ML IJ SOLN
40.0000 mg | Freq: Every day | INTRAMUSCULAR | Status: DC
  Administered 2021-05-12 – 2021-05-14 (×3): 40 mg via INTRAVENOUS
  Filled 2021-05-12 (×3): qty 4

## 2021-05-12 NOTE — Progress Notes (Addendum)
Palliative Medicine Inpatient Follow Up Note     Chart reviewed and patient examined at the bedside.   Mr. Dafoe is sitting up in his recliner. He is extremely frustrated expressing he is in pain from radiation on yesterday, in addition to all testing today (echo and ultrasound). Reports he was feeling better and then after treatment went downhill from there as pain continued to be uncontrolled.   He is emotional expressing he wants to complete recommended treatment but feels staff does not understand his pain and sensitivity. He is able to tolerate his radiation until he has to place hands across chest causing pain to escalate. He is inquiring abut decreasing timeframe and length of each daily treatment. I listened empathetically and discussed at length the goal of radiation and the inability to shorten time frame of treatments as his regimen is designed for his particular needs and cannot be altered with a goal of achieving maximum effectiveness.   I created space assuring his goals were to continue treatment with awareness if he is not able to tolerate the medical team would not want to continue care if he wished to forego. He expresses goals to continue treatment but states he understands his rights and today he is not up for going down. States "I know my body and I can't do it today and I am not going to push myself".   Jerene Pitch, RN and Jarrett Soho, RN also at the bedside during discussions. I offered ongoing support and expressed the medical team is putting forth every effort to stabilize his pain and get him to a place he can discharge from the hospital as he desires.   I discussed his pain regimen at length. Brooke, RN has administered bolus as ordered in addition to robaxin and ativan. He is appearing much calmer by the end of our discussions and seems to be sitting more at ease in his recliner.   His appetite is much improved. He is eating a cheeseburger during our discussion.   We discussed  his limited family support as all of his family resides up [place] including his children. He expresses he would like to return there at some point.   All questions answered and ongoing support provided.   Objective Assessment: Vital Signs Vitals:   05/12/21 2044 05/12/21 2045  BP:  126/66  Pulse:  82  Resp: 18 18  Temp:  99.3 F (37.4 C)  SpO2:  97%    Intake/Output Summary (Last 24 hours) at 05/12/2021 2047 Last data filed at 05/12/2021 1700 Gross per 24 hour  Intake 1080 ml  Output 2600 ml  Net -1520 ml    Last Weight  Most recent update: 05/01/2021  8:40 PM    Weight  99.1 kg (218 lb 7.6 oz)            Gen:  NAD, emotional and upset  CV: Normal breathing pattern  PULM: clear to auscultation bilaterally.  ABD: soft/nontender/nondistended/normal bowel sounds EXT: bilateral lower extremity edema  Neuro: Alert and oriented x3  SUMMARY OF RECOMMENDATIONS   Continue with current plan of care per medical team  Potential SNF placement once radiation is completed on 2/10.  Symptom Management:  Pain related to neoplasm/metastatic disease PCA queried. He has utilized  24 mg of IV Dilaudid in the last 24 hours.  Unfortunately, he is experiencing pain from bony metastasis and this can be one of the most difficult types of pain to treat.  Continue steroids.  Continue PCA with  adjustments made for bolus dose of 1 mg with 30 minute lockout.    Bisphosphonates can also be beneficial, however, he has not received recent dental care and consideration of risk versus benefit with bisphosphonates is necessary, particularly in regards to risk for osteonecrosis of the jaw.  Given patient's continued high use of PCA conversions would be 113mg  of Oxycontin to convert from his IV dilaudid use. This will not be sustainable outpatient given high amounts. We will transition patient to oral methadone. We will not dose reduce for cross intolerance with hopes of continued relief and a better response.  Methadone increased to 15mg  TID.  Lidocaine patch x2 every 24 hours Scheduled Tylenol 1000 mg 3 times daily Toradol 15 mg every 8 hours x 3 doses-completed Gabapentin increased to 300 twice daily in addition to 600 mg nightly. Robaxin 500mg  as needed for muscle spasm Will premedicate with robaxin prior to radiation and ativan needed for anxiety in addition to 1-2mg  bolus of dilaudid available BID.  Oral dilaudid 4mg  as needed for breakthrough  If patient's pain continue to be uncontrolled may consider one time dose of ketamine.  Long-term, radiation is the best modality to hopefully help control his pain and improve his functional status.  My concern is that it may be difficult to control his pain until we have reached a point where radiation begins to take effect.  Constipation MiraLAX twice daily Senna-S twice daily as needed Colace twice daily  Dyspepsia Protonix 40 mg twice daily Anxiety Mr. Gertner is exhibiting anxiety and anticipation that he will have pain and fear of pain not being controlled. Tearful expressing his concerns but appreciation of current comfort level.  Ativan 0.5mg  BID as needed for anxiety.   Time Total: 45 min  Visit consisted of counseling and education dealing with the complex and emotionally intense issues of symptom management and palliative care in the setting of serious and potentially life-threatening illness.Greater than 50%  of this time was spent counseling and coordinating care related to the above assessment and plan.  Alda Lea, AGPCNP-BC  Palliative Medicine Team [phone]

## 2021-05-12 NOTE — Progress Notes (Signed)
Echocardiogram 2D Echocardiogram has been performed.  Jefferey Pica 05/12/2021, 1:44 PM

## 2021-05-12 NOTE — Progress Notes (Signed)
Physical Therapy Treatment Patient Details Name: Randall Larsen. MRN: [id number] DOB: May 05, 1955 Today's Date: 05/12/2021   History of Present Illness Randall Desa. is a 66 y.o. male with medical history significant of prostate Ca with mets to bones. Presented with chest pain related to mets on 05/01/21.    PT Comments    Pt reports his pain is too severe to tolerate ambulation today. Noted dopplers were negative for DVTs in BLEs today. Pt agreed to chair level BUE/LE strengthening exercises.    Recommendations for follow up therapy are one component of a multi-disciplinary discharge planning process, led by the attending physician.  Recommendations may be updated based on patient status, additional functional criteria and insurance authorization.  Follow Up Recommendations  Skilled nursing-short term rehab (<3 hours/day)     Assistance Recommended at Discharge Frequent or constant Supervision/Assistance  Patient can return home with the following A little help with walking and/or transfers;Assistance with cooking/housework;Assist for transportation;Help with stairs or ramp for entrance;A little help with bathing/dressing/bathroom   Equipment Recommendations  Rolling walker (2 wheels)    Recommendations for Other Services       Precautions / Restrictions Precautions Precautions: Other (comment) Precaution Comments: pain in left chest, right hip, back Restrictions Weight Bearing Restrictions: No     Mobility  Bed Mobility               General bed mobility comments: up in recliner    Transfers                   General transfer comment: pt declined mobility 2* severe pain    Ambulation/Gait               General Gait Details: pt declined mobility 2* severe pain   Stairs             Wheelchair Mobility    Modified Rankin (Stroke Patients Only)       Balance                                             Cognition Arousal/Alertness: Awake/alert Behavior During Therapy: WFL for tasks assessed/performed Overall Cognitive Status: Within Functional Limits for tasks assessed                                          Exercises General Exercises - Upper Extremity Shoulder Flexion: AROM, Both, 10 reps, Seated General Exercises - Lower Extremity Ankle Circles/Pumps: AROM, Both, 10 reps, Seated Quad Sets: AROM, Both, 5 reps, Supine Gluteal Sets: AROM, Both, 5 reps, Supine Long Arc Quad: AROM, Both, 10 reps, Seated Hip Flexion/Marching: AROM, Both, 10 reps, Seated Chair push ups x 7   General Comments        Pertinent Vitals/Pain Pain Assessment Pain Score: 7  Pain Location: back, R hip Pain Descriptors / Indicators: Grimacing, Crying, Guarding Pain Intervention(s): Limited activity within patient's tolerance, Monitored during session, Patient requesting pain meds-RN notified, Premedicated before session    Home Living                          Prior Function            PT Goals (current goals can now  be found in the care plan section) Acute Rehab PT Goals Patient Stated Goal: To not have pain PT Goal Formulation: With patient Time For Goal Achievement: 05/16/21 Potential to Achieve Goals: Good Progress towards PT goals: Progressing toward goals    Frequency    Min 2X/week      PT Plan Current plan remains appropriate    Co-evaluation              AM-PAC PT "6 Clicks" Mobility   Outcome Measure  Help needed turning from your back to your side while in a flat bed without using bedrails?: A Little Help needed moving from lying on your back to sitting on the side of a flat bed without using bedrails?: A Little Help needed moving to and from a bed to a chair (including a wheelchair)?: A Little Help needed standing up from a chair using your arms (e.g., wheelchair or bedside chair)?: A Little Help needed to walk in hospital room?: A  Little Help needed climbing 3-5 steps with a railing? : A Lot 6 Click Score: 17    End of Session Equipment Utilized During Treatment: Gait belt Activity Tolerance: Patient limited by pain Patient left: with chair alarm set;in chair;with call bell/phone within reach Nurse Communication: Mobility status PT Visit Diagnosis: Muscle weakness (generalized) (M62.81);Pain;Difficulty in walking, not elsewhere classified (R26.2)     Time: 7829-5621 PT Time Calculation (min) (ACUTE ONLY): 14 min  Charges:  $Therapeutic Exercise: 8-22 mins                    Blondell Reveal Kistler PT 05/12/2021  Acute Rehabilitation Services Pager [phone] Office [phone]

## 2021-05-12 NOTE — Progress Notes (Signed)
PROGRESS NOTE    Randall Larsen.  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  Patient was evaluated for urgent radiation therapy and has started radiation treatment on 05/04/2021.  Palliative care consulted to assist with the pain control currently on Dilaudid PCA.  Patient is started on methadone 10 mg 3 times daily.  Assessment & Plan:   Principal Problem:   Cancer associated pain Active Problems:   Prostate cancer ([name])   Bone metastases ([name])   Essential hypertension   Chest pain   Edema  Cancer related pain ( Metastatic Prostate Cancer): Patient with prostate cancer with metastasis to the bone. Palliative care consulted, appreciate recommendation. Continue Dilaudid PCA, oxycodone switched with methadone 10 mg 3 times daily. Continue dexamethasone. Continue radiation therapy for ongoing pain, so far has completed 4 treatments.. Long-term, radiation is the best modality to help control his pain and improve his functional status.  Chest pain, Musculoskeletal: Likely due to bony metastasis.  Not suspicious for ACS. EKG unremarkable.  Essential hypertension: Continue amlodipine, metoprolol, hydralazine. BP remains elevated intermittently due to pain.  Metastatic prostate cancer Continue ongoing radiation therapy. Patient has not completed radiation therapy yesterday.  Bilateral leg swelling: Obtain venous duplex to rule out DVT. Start Lasix 40 mg IV daily. Obtain 2d Echo   Obesity class I: BMI 31.3. Diet and exercise discussed.  DVT prophylaxis: SCDs Code Status: Full code. Family Communication: No family at bed side. Disposition Plan:   Status is: Inpatient  Remains  inpatient appropriate because:  Patient with metastatic prostate cancer to the bone presented with intractable pain.  Palliative care is helping with pain management,  Patient is on Dilaudid PCA.  Anticipated discharge to skilled nursing facility once pain is manageable with oral pain medications.  Consultants:  Oncology Palliative care  Procedures: Dilaudid PCA Antimicrobials:  Anti-infectives (From admission, onward)    None       Subjective: Patient was seen and examined at bedside.  Overnight events noted.   Patient was sitting on the chair, stated pain is not controlled. He has not completed radiation treatment yesterday because of ongoing pain. He is continued on Dilaudid PCA, also started on methadone.  Objective: Vitals:   05/12/21 0405 05/12/21 0452 05/12/21 1038 05/12/21 1052  BP:  (!) 165/75    Pulse:  78    Resp: 18 18 18 14   Temp:  97.8 F (36.6 C)    TempSrc:  Oral    SpO2: 96% 99% 97% 95%  Weight:      Height:        Intake/Output Summary (Last 24 hours) at 05/12/2021 1148 Last data filed at 05/12/2021 0600 Gross per 24 hour  Intake 480 ml  Output 1475 ml  Net -995 ml   Filed Weights   05/01/21 2023  Weight: 99.1 kg    Examination:  General exam: Appears deconditioned, continue to remain pain, comfortable. Respiratory system: Clear to auscultation bilaterally, respiratory effort normal, RR 15. Cardiovascular system: S1-S2 heard, regular rate and rhythm, no murmur. Gastrointestinal system: Abdomen is soft, nontender, nondistended, BS+ Central nervous system: Alert and oriented X 3. No focal neurological deficits. Extremities: Edema++, cyanosis, no clubbing. Skin: No rashes, lesions or ulcers Psychiatry: Judgement  and insight appear normal. Mood & affect appropriate.     Data Reviewed: I have personally reviewed following labs and imaging studies  CBC: Recent Labs  Lab 05/08/21 0434 05/09/21 0855  WBC 10.0  --   HGB 9.8* 11.1*  HCT 30.0*  33.4*  MCV 92.0  --   PLT 238  --    Basic Metabolic Panel: Recent Labs  Lab 05/06/21 0513 05/08/21 0434 05/10/21 0440  NA 138 137 137  K 4.4 4.3 4.5  CL 106 106 108  CO2 22 23 22   GLUCOSE 110* 110* 111*  BUN 27* 37* 41*  CREATININE 0.99 1.07 0.93  CALCIUM 9.5 9.3 9.1  MG 2.3  --   --    GFR: Estimated Creatinine Clearance: 92.2 mL/min (by C-G formula based on SCr of 0.93 mg/dL). Liver Function Tests: No results for input(s): AST, ALT, ALKPHOS, BILITOT, PROT, ALBUMIN in the last 168 hours.  No results for input(s): LIPASE, AMYLASE in the last 168 hours. No results for input(s): AMMONIA in the last 168 hours. Coagulation Profile: No results for input(s): INR, PROTIME in the last 168 hours. Cardiac Enzymes: No results for input(s): CKTOTAL, CKMB, CKMBINDEX, TROPONINI in the last 168 hours.  BNP (last 3 results) No results for input(s): PROBNP in the last 8760 hours. HbA1C: No results for input(s): HGBA1C in the last 72 hours. CBG: No results for input(s): GLUCAP in the last 168 hours. Lipid Profile: No results for input(s): CHOL, HDL, LDLCALC, TRIG, CHOLHDL, LDLDIRECT in the last 72 hours. Thyroid Function Tests: No results for input(s): TSH, T4TOTAL, FREET4, T3FREE, THYROIDAB in the last 72 hours. Anemia Panel: No results for input(s): VITAMINB12, FOLATE, FERRITIN, TIBC, IRON, RETICCTPCT in the last 72 hours. Sepsis Labs: No results for input(s): PROCALCITON, LATICACIDVEN in the last 168 hours.  No results found for this or any previous visit (from the past 240 hour(s)).   Radiology Studies: No results found.  Scheduled Meds:  acetaminophen  1,000 mg Oral TID   amLODipine  10 mg Oral Daily   celecoxib  200 mg Oral BID   dexamethasone  6 mg Oral Daily   docusate sodium  100 mg Oral Q12H   furosemide  40 mg Intravenous Daily   gabapentin  300 mg Oral BID   gabapentin  600 mg Oral QHS   hydrALAZINE  50 mg Oral TID   HYDROmorphone   Intravenous Q4H    HYDROmorphone  1 mg Intravenous Daily   lidocaine  2 patch Transdermal Q24H   methadone  15 mg Oral Q8H   metoprolol tartrate  25 mg Oral BID   pantoprazole  40 mg Oral BID   polyethylene glycol  17 g Oral BID   potassium chloride  20 mEq Oral Daily   senna-docusate  2 tablet Oral BID   Continuous Infusions:   LOS: 10 days    Time spent: 35 mins    Crystalle Popwell, MD [facility]   If 7PM-7AM, please contact night-coverage

## 2021-05-12 NOTE — Progress Notes (Signed)
PT Cancellation Note  Patient Details Name: Randall Larsen. MRN: [id number] DOB: Jun 22, 1955   Cancelled Treatment:    Reason Eval/Treat Not Completed: Other (comment) (dopplers to r/o LE DVT just ordered. Will hold and follow.)  Philomena Doheny PT 05/12/2021  Acute Rehabilitation Services Pager [phone] Office [phone]

## 2021-05-12 NOTE — Progress Notes (Signed)
BLE venous duplex has been completed.  Results can be found under chart review under CV PROC. 05/12/2021 12:30 PM Akira Perusse RVT, RDMS

## 2021-05-13 LAB — BASIC METABOLIC PANEL
Anion gap: 9 (ref 5–15)
BUN: 39 mg/dL — ABNORMAL HIGH (ref 8–23)
CO2: 23 mmol/L (ref 22–32)
Calcium: 9.1 mg/dL (ref 8.9–10.3)
Chloride: 105 mmol/L (ref 98–111)
Creatinine, Ser: 0.95 mg/dL (ref 0.61–1.24)
GFR, Estimated: >60 mL/min (ref >60)
Glucose, Bld: 139 mg/dL — ABNORMAL HIGH (ref 70–99)
Potassium: 4.3 mmol/L (ref 3.5–5.1)
Sodium: 137 mmol/L (ref 135–145)

## 2021-05-13 LAB — CBC
HCT: 31.3 % — ABNORMAL LOW (ref 39.0–52.0)
Hemoglobin: 10 g/dL — ABNORMAL LOW (ref 13.0–17.0)
MCH: 30 pg (ref 26.0–34.0)
MCHC: 31.9 g/dL (ref 30.0–36.0)
MCV: 94 fL (ref 80.0–100.0)
Platelets: 200 10*3/uL (ref 150–400)
RBC: 3.33 MIL/uL — ABNORMAL LOW (ref 4.22–5.81)
RDW: 17.3 % — ABNORMAL HIGH (ref 11.5–15.5)
WBC: 7.3 10*3/uL (ref 4.0–10.5)
nRBC: 4.1 % — ABNORMAL HIGH (ref 0.0–0.2)

## 2021-05-13 LAB — MAGNESIUM: Magnesium: 2.5 mg/dL — ABNORMAL HIGH (ref 1.7–2.4)

## 2021-05-13 LAB — PHOSPHORUS: Phosphorus: 4.4 mg/dL (ref 2.5–4.6)

## 2021-05-13 MED ORDER — ENOXAPARIN SODIUM 40 MG/0.4ML IJ SOSY
40.0000 mg | PREFILLED_SYRINGE | INTRAMUSCULAR | Status: DC
  Filled 2021-05-13 (×11): qty 0.4

## 2021-05-13 NOTE — Progress Notes (Signed)
PROGRESS NOTE    Randall Larsen.  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  Patient was evaluated for urgent radiation therapy and has started radiation treatment on 05/04/2021.  Palliative care consulted to assist with the pain control currently on Dilaudid PCA.  Patient is started on methadone 10 mg 3 times daily.  Assessment & Plan:   Principal Problem:   Cancer associated pain Active Problems:   Prostate cancer ([place])   Bone metastases ([place])   Essential hypertension   Chest pain   Edema  Cancer related pain ( Metastatic Prostate Cancer): Patient with prostate cancer with metastasis to the bone. Palliative care consulted, appreciate recommendation. Continue Dilaudid PCA, Continue methadone 10 mg 3 times daily. Continue dexamethasone. Continue radiation therapy for ongoing pain, so far has completed 5 treatments.. Long-term, radiation is the best modality to help control his pain and improve his functional status.  Chest pain, Musculoskeletal: Likely due to bony metastasis.  Not suspicious for ACS. EKG unremarkable.  Essential hypertension: Continue metoprolol, hydralazine. BP remains elevated intermittently due to pain. Discontinue amlodipine due to bilateral leg swelling.  Metastatic prostate cancer Continue ongoing radiation therapy. Patient has not completed radiation therapy yesterday.    Bilateral leg swelling: Bilateral venous duplex negative for DVT. Continue Lasix 40 mg IV daily. 2D echocardiogram LVEF 60 to 65%, no RW MA Discontinue amlodipine.  Obesity class I: BMI 31.3. Diet and exercise discussed.  DVT prophylaxis: SCDs Code Status: Full code. Family  Communication: No family at bed side. Disposition Plan:   Status is: Inpatient  Remains inpatient appropriate because:  Patient with metastatic prostate cancer to the bone presented with intractable pain.  Palliative care is helping with pain management,  Patient is on Dilaudid PCA.  Anticipated discharge to skilled nursing facility once pain is manageable with oral pain medications.  Consultants:  Oncology Palliative care  Procedures: Dilaudid PCA Antimicrobials:  Anti-infectives (From admission, onward)    None       Subjective: Patient was seen and examined at bedside.  Overnight events noted.   Patient was sitting on the chair, appears sad, upset on his condition. He has not completed radiation treatment yesterday because of ongoing pain. He is continued on Dilaudid PCA, also started on methadone.  Objective: Vitals:   05/13/21 0527 05/13/21 0800 05/13/21 0939 05/13/21 1007  BP: (!) 159/75  (!) 142/76   Pulse: 81  88   Resp: 20 13 20 20   Temp: 98 F (36.7 C)  98.2 F (36.8 C)   TempSrc: Oral  Oral   SpO2: 98% 94% 95% 95%  Weight:      Height:        Intake/Output Summary (Last 24 hours) at 05/13/2021 1031 Last data filed at 05/13/2021 0647 Gross per 24 hour  Intake 600 ml  Output 2350 ml  Net -1750 ml   Filed Weights   05/01/21 2023  Weight: 99.1 kg    Examination:  General exam: Appears deconditioned, upset, crying in pain, no acute distress. Respiratory system: Clear to auscultation bilaterally, respiratory effort normal, RR 14. Cardiovascular system: S1-S2 heard, regular rate and rhythm, no murmur. Gastrointestinal system: Abdomen is soft, non tender, non distended, BS+ Central nervous system: Alert  and oriented X 3. No focal neurological deficits. Extremities: Edema++, cyanosis, no clubbing. Skin: No rashes, lesions or ulcers Psychiatry: Judgement and insight appear normal. Mood & affect appropriate.     Data Reviewed: I have personally reviewed  following labs and imaging studies  CBC: Recent Labs  Lab 05/08/21 0434 05/09/21 0855 05/13/21 0552  WBC 10.0  --  7.3  HGB 9.8* 11.1* 10.0*  HCT 30.0* 33.4* 31.3*  MCV 92.0  --  94.0  PLT 238  --  585   Basic Metabolic Panel: Recent Labs  Lab 05/08/21 0434 05/10/21 0440 05/13/21 0552  NA 137 137 137  K 4.3 4.5 4.3  CL 106 108 105  CO2 23 22 23   GLUCOSE 110* 111* 139*  BUN 37* 41* 39*  CREATININE 1.07 0.93 0.95  CALCIUM 9.3 9.1 9.1  MG  --   --  2.5*  PHOS  --   --  4.4   GFR: Estimated Creatinine Clearance: 90.2 mL/min (by C-G formula based on SCr of 0.95 mg/dL). Liver Function Tests: No results for input(s): AST, ALT, ALKPHOS, BILITOT, PROT, ALBUMIN in the last 168 hours.  No results for input(s): LIPASE, AMYLASE in the last 168 hours. No results for input(s): AMMONIA in the last 168 hours. Coagulation Profile: No results for input(s): INR, PROTIME in the last 168 hours. Cardiac Enzymes: No results for input(s): CKTOTAL, CKMB, CKMBINDEX, TROPONINI in the last 168 hours.  BNP (last 3 results) No results for input(s): PROBNP in the last 8760 hours. HbA1C: No results for input(s): HGBA1C in the last 72 hours. CBG: No results for input(s): GLUCAP in the last 168 hours. Lipid Profile: No results for input(s): CHOL, HDL, LDLCALC, TRIG, CHOLHDL, LDLDIRECT in the last 72 hours. Thyroid Function Tests: No results for input(s): TSH, T4TOTAL, FREET4, T3FREE, THYROIDAB in the last 72 hours. Anemia Panel: No results for input(s): VITAMINB12, FOLATE, FERRITIN, TIBC, IRON, RETICCTPCT in the last 72 hours. Sepsis Labs: No results for input(s): PROCALCITON, LATICACIDVEN in the last 168 hours.  No results found for this or any previous visit (from the past 240 hour(s)).   Radiology Studies: ECHOCARDIOGRAM COMPLETE  Result Date: 05/12/2021    ECHOCARDIOGRAM REPORT   Patient Name:   Randall Larsen. Date of Exam: 05/12/2021 Medical Rec #:  [id number]         Height:        70.0 in Accession #:    [id number]        Weight:       218.5 lb Date of Birth:  12/05/1955         BSA:          2.167 m Patient Age:    75 years          BP:           165/75 mmHg Patient Gender: M                 HR:           78 bpm. Exam Location:  Inpatient Procedure: 2D Echo Indications:    Chest pain  History:        Patient has no prior history of Echocardiogram examinations.                 Risk Factors:Hypertension.  Sonographer:    Jefferey Pica Referring Phys: [place]  1. Left ventricular ejection fraction, by estimation, is 60 to 65%. The left ventricle has normal  function. The left ventricle has no regional wall motion abnormalities. There is severe left ventricular hypertrophy. Left ventricular diastolic parameters  were normal.  2. Right ventricular systolic function is normal. The right ventricular size is normal. There is normal pulmonary artery systolic pressure.  3. Left atrial size was mildly dilated.  4. A small pericardial effusion is present. The pericardial effusion is posterior to the left ventricle.  5. The mitral valve is abnormal. Trivial mitral valve regurgitation. No evidence of mitral stenosis.  6. The aortic valve is tricuspid. There is mild calcification of the aortic valve. Aortic valve regurgitation is not visualized. Aortic valve sclerosis is present, with no evidence of aortic valve stenosis.  7. The inferior vena cava is normal in size with greater than 50% respiratory variability, suggesting right atrial pressure of 3 mmHg. FINDINGS  Left Ventricle: Left ventricular ejection fraction, by estimation, is 60 to 65%. The left ventricle has normal function. The left ventricle has no regional wall motion abnormalities. The left ventricular internal cavity size was normal in size. There is  severe left ventricular hypertrophy. Left ventricular diastolic parameters were normal. Right Ventricle: The right ventricular size is normal. No increase in right  ventricular wall thickness. Right ventricular systolic function is normal. There is normal pulmonary artery systolic pressure. The tricuspid regurgitant velocity is 2.57 m/s, and  with an assumed right atrial pressure of 5 mmHg, the estimated right ventricular systolic pressure is 16.1 mmHg. Left Atrium: Left atrial size was mildly dilated. Right Atrium: Right atrial size was normal in size. Pericardium: A small pericardial effusion is present. The pericardial effusion is posterior to the left ventricle. Mitral Valve: The mitral valve is abnormal. There is mild thickening of the mitral valve leaflet(s). There is mild calcification of the mitral valve leaflet(s). Trivial mitral valve regurgitation. No evidence of mitral valve stenosis. Tricuspid Valve: The tricuspid valve is normal in structure. Tricuspid valve regurgitation is mild . No evidence of tricuspid stenosis. Aortic Valve: The aortic valve is tricuspid. There is mild calcification of the aortic valve. Aortic valve regurgitation is not visualized. Aortic valve sclerosis is present, with no evidence of aortic valve stenosis. Aortic valve peak gradient measures 10.1 mmHg. Pulmonic Valve: The pulmonic valve was normal in structure. Pulmonic valve regurgitation is not visualized. No evidence of pulmonic stenosis. Aorta: The aortic root is normal in size and structure. Venous: The inferior vena cava is normal in size with greater than 50% respiratory variability, suggesting right atrial pressure of 3 mmHg. IAS/Shunts: No atrial level shunt detected by color flow Doppler.  LEFT VENTRICLE PLAX 2D LVIDd:         5.30 cm Diastology LVIDs:         3.70 cm LV e' medial:  5.10 cm/s LV PW:         1.60 cm LV e' lateral: 7.95 cm/s LV IVS:        1.70 cm  RIGHT VENTRICLE          IVC RV Basal diam:  2.70 cm  IVC diam: 1.40 cm TAPSE (M-mode): 3.8 cm LEFT ATRIUM              Index        RIGHT ATRIUM           Index LA diam:        4.00 cm  1.85 cm/m   RA Area:     23.50  cm LA Vol (A2C):   106.0 ml 48.91 ml/m  RA Volume:   74.70 ml  34.47 ml/m LA Vol (A4C):   109.0 ml 50.30 ml/m LA Biplane Vol: 110.0 ml 50.76 ml/m  AORTIC VALVE              PULMONIC VALVE AV Vmax:      159.00 cm/s PV Vmax:       1.06 m/s AV Peak Grad: 10.1 mmHg   PV Peak grad:  4.5 mmHg LVOT Vmax:    117.00 cm/s LVOT Vmean:   82.600 cm/s LVOT VTI:     0.292 m  AORTA Ao Root diam: 3.70 cm Ao Asc diam:  3.50 cm MV A Prime: 7.2 cm/s  TRICUSPID VALVE                       TR Peak grad:   26.4 mmHg                       TR Vmax:        257.00 cm/s                        SHUNTS                       Systemic VTI: 0.29 m Jenkins Rouge MD Electronically signed by Jenkins Rouge MD Signature Date/Time: 05/12/2021/1:51:55 PM    Final    VAS [place] LOWER EXTREMITY VENOUS (DVT)  Result Date: 05/12/2021  Lower Venous DVT Study Patient Name:  Randall Larsen.  Date of Exam:   05/12/2021 Medical Rec #: [id number]          Accession #:    [id number] Date of Birth: 1955-08-19          Patient Gender: M Patient Age:   35 years Exam Location:  [facility] Procedure:      VAS [place] LOWER EXTREMITY VENOUS (DVT) Referring Phys: Shawna Clamp --------------------------------------------------------------------------------  Indications: Edema.  Risk Factors: CA patient. Comparison Study: Previous exam on 05/02/2021 was negative for DVT Performing Technologist: Rogelia Rohrer RVT, RDMS  Examination Guidelines: A complete evaluation includes B-mode imaging, spectral Doppler, color Doppler, and power Doppler as needed of all accessible portions of each vessel. Bilateral testing is considered an integral part of a complete examination. Limited examinations for reoccurring indications may be performed as noted. The reflux portion of the exam is performed with the patient in reverse Trendelenburg.  +---------+---------------+---------+-----------+----------+--------------+  RIGHT     Compressibility Phasicity Spontaneity Properties Thrombus Aging   +---------+---------------+---------+-----------+----------+--------------+  CFV       Full            Yes       Yes                                    +---------+---------------+---------+-----------+----------+--------------+  SFJ       Full                                                             +---------+---------------+---------+-----------+----------+--------------+  FV Prox   Full            Yes  Yes                                    +---------+---------------+---------+-----------+----------+--------------+  FV Mid    Full            Yes       Yes                                    +---------+---------------+---------+-----------+----------+--------------+  FV Distal Full            Yes       Yes                                    +---------+---------------+---------+-----------+----------+--------------+  PFV       Full                                                             +---------+---------------+---------+-----------+----------+--------------+  POP       Full            Yes       Yes                                    +---------+---------------+---------+-----------+----------+--------------+  PTV       Full                                                             +---------+---------------+---------+-----------+----------+--------------+  PERO      Full                                                             +---------+---------------+---------+-----------+----------+--------------+   +---------+---------------+---------+-----------+----------+--------------+  LEFT      Compressibility Phasicity Spontaneity Properties Thrombus Aging  +---------+---------------+---------+-----------+----------+--------------+  CFV       Full            Yes       Yes                                    +---------+---------------+---------+-----------+----------+--------------+  SFJ       Full                                                              +---------+---------------+---------+-----------+----------+--------------+  FV Prox   Full  Yes       Yes                                    +---------+---------------+---------+-----------+----------+--------------+  FV Mid    Full            Yes       Yes                                    +---------+---------------+---------+-----------+----------+--------------+  FV Distal Full            Yes       Yes                                    +---------+---------------+---------+-----------+----------+--------------+  PFV       Full                                                             +---------+---------------+---------+-----------+----------+--------------+  POP       Full            Yes       Yes                                    +---------+---------------+---------+-----------+----------+--------------+  PTV       Full                                                             +---------+---------------+---------+-----------+----------+--------------+  PERO      Full                                                             +---------+---------------+---------+-----------+----------+--------------+     Summary: BILATERAL: - No evidence of deep vein thrombosis seen in the lower extremities, bilaterally. - No evidence of superficial venous thrombosis in the lower extremities, bilaterally. -No evidence of popliteal cyst, bilaterally. Diffuse subcutaneous edema, bilaterally.   *See table(s) above for measurements and observations.    Preliminary     Scheduled Meds:  acetaminophen  1,000 mg Oral TID   celecoxib  200 mg Oral BID   dexamethasone  6 mg Oral Daily   docusate sodium  100 mg Oral Q12H   enoxaparin (LOVENOX) injection  40 mg Subcutaneous Q24H   furosemide  40 mg Intravenous Daily   gabapentin  300 mg Oral BID   gabapentin  600 mg Oral QHS   hydrALAZINE  50 mg Oral TID   HYDROmorphone   Intravenous Q4H   lidocaine  2 patch Transdermal Q24H   methadone  15 mg Oral Q8H    metoprolol tartrate  25 mg Oral BID   pantoprazole  40 mg Oral BID   polyethylene glycol  17 g Oral BID   potassium chloride  20 mEq Oral Daily   senna-docusate  2 tablet Oral BID   Continuous Infusions:   LOS: 11 days    Time spent: 35 mins    Qualyn Oyervides, MD [facility]   If 7PM-7AM, please contact night-coverage

## 2021-05-14 NOTE — Progress Notes (Signed)
PROGRESS NOTE    Randall Larsen.  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  Patient was evaluated for urgent radiation therapy and has started radiation treatment on 05/04/2021.  Palliative care consulted to assist with the pain control currently on Dilaudid PCA.  Patient is started on methadone 10 mg 3 times daily.  Assessment & Plan:   Principal Problem:   Cancer associated pain Active Problems:   Prostate cancer ([place])   Bone metastases ([place])   Essential hypertension   Chest pain   Edema  Cancer related pain ( Metastatic Prostate Cancer): Patient with prostate cancer with metastasis to the bone. Palliative care consulted, appreciate recommendation. Continue Dilaudid PCA, Continue methadone 10 mg 3 times daily. Continue dexamethasone. Continue radiation therapy for ongoing pain, so far has completed 5 treatments.. Long-term, radiation is the best modality to help control this pain and improve his functional status however it may be difficult to control his pain until we have reached to a point when radiation begins to take effect.  Chest pain, Musculoskeletal: Likely due to bony metastasis.  Not suspicious for ACS. EKG unremarkable.  Essential hypertension: Continue metoprolol,  Increase hydralazine to 50 mg TID BP remains elevated intermittently due to pain. Discontinue amlodipine due to bilateral leg swelling.  Metastatic prostate cancer Continue ongoing radiation therapy. Patient has not completed radiation therapy yesterday.   Plan is to premedicate him before having radiation treatment tomorrow.  Bilateral leg swelling: > Improving Bilateral venous duplex negative for  DVT. Continue Lasix 40 mg IV daily. 2D echocardiogram LVEF 60 to 65%, no RW MA Discontinue amlodipine.  Obesity class I: BMI 31.3. Diet and exercise discussed.  DVT prophylaxis: SCDs Code Status: Full code. Family Communication: No family at bed side. Disposition Plan:   Status is: Inpatient  Remains inpatient appropriate because:  Patient with metastatic prostate cancer to the bone presented with intractable pain.  Palliative care is helping with pain management,  Patient is on Dilaudid PCA.  Anticipated discharge to skilled nursing facility once pain is manageable with oral pain medications.  Patient is medically clear for discharge: No  Consultants:  Oncology Palliative care  Procedures: Dilaudid PCA Antimicrobials:  Anti-infectives (From admission, onward)    None       Subjective:  Patient was seen and examined at bedside.  Overnight events noted.   Patient was sitting on the chair, crying with fear of pain not being controlled. He is continued on Dilaudid PCA, also started on methadone. He will be premedicated before radiation treatment tomorrow.  Objective: Vitals:   05/14/21 0209 05/14/21 0412 05/14/21 0639 05/14/21 0841  BP: (!) 143/75  (!) 166/69   Pulse: 74  67   Resp: 16 10 17 14   Temp: 97.8 F (36.6 C)  98.2 F (36.8 C)   TempSrc: Oral  Oral   SpO2: 99% 97% 99% 96%  Weight:      Height:        Intake/Output Summary (Last 24 hours) at 05/14/2021 0947 Last data filed at 05/14/2021 0853 Gross per 24 hour  Intake 360 ml  Output 1895 ml  Net -1535 ml   Filed Weights   05/01/21 2023  Weight: 99.1 kg  Examination:  General exam: Appears comfortable, upset, deconditioned, no acute distress Respiratory system: Clear to auscultation bilaterally, respiratory effort normal, RR 15. Cardiovascular system: S1-S2 heard, regular rate and rhythm, no murmur. Gastrointestinal system: Abdomen is soft, non tender, non distended, BS+ Central nervous system:  Alert and oriented X 3. No focal neurological deficits. Extremities: Edema++, cyanosis, no clubbing. Skin: No rashes, lesions or ulcers Psychiatry: Judgement and insight appear normal. Mood & affect appropriate.     Data Reviewed: I have personally reviewed following labs and imaging studies  CBC: Recent Labs  Lab 05/08/21 0434 05/09/21 0855 05/13/21 0552  WBC 10.0  --  7.3  HGB 9.8* 11.1* 10.0*  HCT 30.0* 33.4* 31.3*  MCV 92.0  --  94.0  PLT 238  --  150   Basic Metabolic Panel: Recent Labs  Lab 05/08/21 0434 05/10/21 0440 05/13/21 0552  NA 137 137 137  K 4.3 4.5 4.3  CL 106 108 105  CO2 23 22 23   GLUCOSE 110* 111* 139*  BUN 37* 41* 39*  CREATININE 1.07 0.93 0.95  CALCIUM 9.3 9.1 9.1  MG  --   --  2.5*  PHOS  --   --  4.4   GFR: Estimated Creatinine Clearance: 90.2 mL/min (by C-G formula based on SCr of 0.95 mg/dL). Liver Function Tests: No results for input(s): AST, ALT, ALKPHOS, BILITOT, PROT, ALBUMIN in the last 168 hours.  No results for input(s): LIPASE, AMYLASE in the last 168 hours. No results for input(s): AMMONIA in the last 168 hours. Coagulation Profile: No results for input(s): INR, PROTIME in the last 168 hours. Cardiac Enzymes: No results for input(s): CKTOTAL, CKMB, CKMBINDEX, TROPONINI in the last 168 hours.  BNP (last 3 results) No results for input(s): PROBNP in the last 8760 hours. HbA1C: No results for input(s): HGBA1C in the last 72 hours. CBG: No results for input(s): GLUCAP in the last 168 hours. Lipid Profile: No results for input(s): CHOL, HDL, LDLCALC, TRIG, CHOLHDL, LDLDIRECT in the last 72 hours. Thyroid Function Tests: No results for input(s): TSH, T4TOTAL, FREET4, T3FREE, THYROIDAB in the last 72 hours. Anemia Panel: No results for input(s): VITAMINB12, FOLATE, FERRITIN, TIBC, IRON, RETICCTPCT in the last 72 hours. Sepsis Labs: No results for input(s): PROCALCITON, LATICACIDVEN in the last 168 hours.  No results found for  this or any previous visit (from the past 240 hour(s)).   Radiology Studies: ECHOCARDIOGRAM COMPLETE  Result Date: 05/12/2021    ECHOCARDIOGRAM REPORT   Patient Name:   Randall Larsen. Date of Exam: 05/12/2021 Medical Rec #:  [id number]         Height:       70.0 in Accession #:    [id number]        Weight:       218.5 lb Date of Birth:  [birth year]         BSA:          2.167 m Patient Age:    51 years          BP:           165/75 mmHg Patient Gender: M                 HR:           78 bpm. Exam Location:  Inpatient Procedure: 2D Echo Indications:    Chest pain  History:        Patient has no prior history of Echocardiogram examinations.  Risk Factors:Hypertension.  Sonographer:    Jefferey Pica Referring Phys: Caroleen  1. Left ventricular ejection fraction, by estimation, is 60 to 65%. The left ventricle has normal function. The left ventricle has no regional wall motion abnormalities. There is severe left ventricular hypertrophy. Left ventricular diastolic parameters  were normal.  2. Right ventricular systolic function is normal. The right ventricular size is normal. There is normal pulmonary artery systolic pressure.  3. Left atrial size was mildly dilated.  4. A small pericardial effusion is present. The pericardial effusion is posterior to the left ventricle.  5. The mitral valve is abnormal. Trivial mitral valve regurgitation. No evidence of mitral stenosis.  6. The aortic valve is tricuspid. There is mild calcification of the aortic valve. Aortic valve regurgitation is not visualized. Aortic valve sclerosis is present, with no evidence of aortic valve stenosis.  7. The inferior vena cava is normal in size with greater than 50% respiratory variability, suggesting right atrial pressure of 3 mmHg. FINDINGS  Left Ventricle: Left ventricular ejection fraction, by estimation, is 60 to 65%. The left ventricle has normal function. The left ventricle has no regional wall  motion abnormalities. The left ventricular internal cavity size was normal in size. There is  severe left ventricular hypertrophy. Left ventricular diastolic parameters were normal. Right Ventricle: The right ventricular size is normal. No increase in right ventricular wall thickness. Right ventricular systolic function is normal. There is normal pulmonary artery systolic pressure. The tricuspid regurgitant velocity is 2.57 m/s, and  with an assumed right atrial pressure of 5 mmHg, the estimated right ventricular systolic pressure is 31.4 mmHg. Left Atrium: Left atrial size was mildly dilated. Right Atrium: Right atrial size was normal in size. Pericardium: A small pericardial effusion is present. The pericardial effusion is posterior to the left ventricle. Mitral Valve: The mitral valve is abnormal. There is mild thickening of the mitral valve leaflet(s). There is mild calcification of the mitral valve leaflet(s). Trivial mitral valve regurgitation. No evidence of mitral valve stenosis. Tricuspid Valve: The tricuspid valve is normal in structure. Tricuspid valve regurgitation is mild . No evidence of tricuspid stenosis. Aortic Valve: The aortic valve is tricuspid. There is mild calcification of the aortic valve. Aortic valve regurgitation is not visualized. Aortic valve sclerosis is present, with no evidence of aortic valve stenosis. Aortic valve peak gradient measures 10.1 mmHg. Pulmonic Valve: The pulmonic valve was normal in structure. Pulmonic valve regurgitation is not visualized. No evidence of pulmonic stenosis. Aorta: The aortic root is normal in size and structure. Venous: The inferior vena cava is normal in size with greater than 50% respiratory variability, suggesting right atrial pressure of 3 mmHg. IAS/Shunts: No atrial level shunt detected by color flow Doppler.  LEFT VENTRICLE PLAX 2D LVIDd:         5.30 cm Diastology LVIDs:         3.70 cm LV e' medial:  5.10 cm/s LV PW:         1.60 cm LV e'  lateral: 7.95 cm/s LV IVS:        1.70 cm  RIGHT VENTRICLE          IVC RV Basal diam:  2.70 cm  IVC diam: 1.40 cm TAPSE (M-mode): 3.8 cm LEFT ATRIUM              Index        RIGHT ATRIUM           Index LA diam:  4.00 cm  1.85 cm/m   RA Area:     23.50 cm LA Vol (A2C):   106.0 ml 48.91 ml/m  RA Volume:   74.70 ml  34.47 ml/m LA Vol (A4C):   109.0 ml 50.30 ml/m LA Biplane Vol: 110.0 ml 50.76 ml/m  AORTIC VALVE              PULMONIC VALVE AV Vmax:      159.00 cm/s PV Vmax:       1.06 m/s AV Peak Grad: 10.1 mmHg   PV Peak grad:  4.5 mmHg LVOT Vmax:    117.00 cm/s LVOT Vmean:   82.600 cm/s LVOT VTI:     0.292 m  AORTA Ao Root diam: 3.70 cm Ao Asc diam:  3.50 cm MV A Prime: 7.2 cm/s  TRICUSPID VALVE                       TR Peak grad:   26.4 mmHg                       TR Vmax:        257.00 cm/s                        SHUNTS                       Systemic VTI: 0.29 m Jenkins Rouge MD Electronically signed by Jenkins Rouge MD Signature Date/Time: 05/12/2021/1:51:55 PM    Final    VAS [place] LOWER EXTREMITY VENOUS (DVT)  Result Date: 05/13/2021  Lower Venous DVT Study Patient Name:  Randall Larsen.  Date of Exam:   05/12/2021 Medical Rec #: [id number]          Accession #:    [id number] Date of Birth: 1955-09-04          Patient Gender: M Patient Age:   72 years Exam Location[facility]  [facility] Procedure:      VAS [place] LOWER EXTREMITY VENOUS (DVT) Referring Phys: Shawna Clamp --------------------------------------------------------------------------------  Indications: Edema.  Risk Factors: CA patient. Comparison Study: Previous exam on 05/02/2021 was negative for DVT Performing Technologist: Rogelia Rohrer RVT, RDMS  Examination Guidelines: A complete evaluation includes B-mode imaging, spectral Doppler, color Doppler, and power Doppler as needed of all accessible portions of each vessel. Bilateral testing is considered an integral part of a complete examination. Limited examinations for reoccurring indications may be  performed as noted. The reflux portion of the exam is performed with the patient in reverse Trendelenburg.  +---------+---------------+---------+-----------+----------+--------------+  RIGHT     Compressibility Phasicity Spontaneity Properties Thrombus Aging  +---------+---------------+---------+-----------+----------+--------------+  CFV       Full            Yes       Yes                                    +---------+---------------+---------+-----------+----------+--------------+  SFJ       Full                                                             +---------+---------------+---------+-----------+----------+--------------+  FV Prox   Full            Yes       Yes                                    +---------+---------------+---------+-----------+----------+--------------+  FV Mid    Full            Yes       Yes                                    +---------+---------------+---------+-----------+----------+--------------+  FV Distal Full            Yes       Yes                                    +---------+---------------+---------+-----------+----------+--------------+  PFV       Full                                                             +---------+---------------+---------+-----------+----------+--------------+  POP       Full            Yes       Yes                                    +---------+---------------+---------+-----------+----------+--------------+  PTV       Full                                                             +---------+---------------+---------+-----------+----------+--------------+  PERO      Full                                                             +---------+---------------+---------+-----------+----------+--------------+   +---------+---------------+---------+-----------+----------+--------------+  LEFT      Compressibility Phasicity Spontaneity Properties Thrombus Aging  +---------+---------------+---------+-----------+----------+--------------+  CFV       Full             Yes       Yes                                    +---------+---------------+---------+-----------+----------+--------------+  SFJ       Full                                                             +---------+---------------+---------+-----------+----------+--------------+  FV Prox   Full            Yes       Yes                                    +---------+---------------+---------+-----------+----------+--------------+  FV Mid    Full            Yes       Yes                                    +---------+---------------+---------+-----------+----------+--------------+  FV Distal Full            Yes       Yes                                    +---------+---------------+---------+-----------+----------+--------------+  PFV       Full                                                             +---------+---------------+---------+-----------+----------+--------------+  POP       Full            Yes       Yes                                    +---------+---------------+---------+-----------+----------+--------------+  PTV       Full                                                             +---------+---------------+---------+-----------+----------+--------------+  PERO      Full                                                             +---------+---------------+---------+-----------+----------+--------------+     Summary: BILATERAL: - No evidence of deep vein thrombosis seen in the lower extremities, bilaterally. - No evidence of superficial venous thrombosis in the lower extremities, bilaterally. -No evidence of popliteal cyst, bilaterally. Diffuse subcutaneous edema, bilaterally.   *See table(s) above for measurements and observations. Electronically signed by Harold Barban MD on 05/13/2021 at 5:31:16 PM.    Final     Scheduled Meds:  acetaminophen  1,000 mg Oral TID   celecoxib  200 mg Oral BID   dexamethasone  6 mg Oral Daily   docusate sodium  100 mg Oral Q12H   enoxaparin (LOVENOX)  injection  40 mg Subcutaneous Q24H   furosemide  40 mg Intravenous Daily   gabapentin  300 mg Oral BID   gabapentin  600 mg Oral QHS   hydrALAZINE  50 mg Oral TID  HYDROmorphone   Intravenous Q4H   lidocaine  2 patch Transdermal Q24H   methadone  15 mg Oral Q8H   metoprolol tartrate  25 mg Oral BID   pantoprazole  40 mg Oral BID   polyethylene glycol  17 g Oral BID   potassium chloride  20 mEq Oral Daily   senna-docusate  2 tablet Oral BID   Continuous Infusions:   LOS: 12 days    Time spent: 35 mins    Mehgan Santmyer, MD [facility]   If 7PM-7AM, please contact night-coverage

## 2021-05-14 NOTE — Progress Notes (Signed)
Pt being seen for prostate cancer. Pt sitting in bedside recliner with chair alarm. Pt friendly and talkative. PT urinal emptied. Pt in NAD, denied CP, SOB. Pt denies any further needs at this time call bell within reach.

## 2021-05-15 ENCOUNTER — Inpatient Hospital Stay: Payer: Medicare HMO | Admitting: Hematology and Oncology

## 2021-05-15 ENCOUNTER — Ambulatory Visit
Admit: 2021-05-15 | Discharge: 2021-05-15 | Disposition: A | Discharge status: Home / Self Care | Payer: Medicare HMO | Attending: Radiation Oncology | Admitting: Radiation Oncology

## 2021-05-15 ENCOUNTER — Ambulatory Visit: Payer: Medicare HMO

## 2021-05-15 ENCOUNTER — Encounter: Payer: Medicare HMO | Admitting: Nurse Practitioner

## 2021-05-15 ENCOUNTER — Ambulatory Visit: Payer: Medicare HMO | Admitting: Radiation Oncology

## 2021-05-15 ENCOUNTER — Inpatient Hospital Stay: Payer: Medicare HMO

## 2021-05-15 DIAGNOSIS — C7951 Secondary malignant neoplasm of bone: Secondary | ICD-10-CM | POA: Diagnosis not present

## 2021-05-15 DIAGNOSIS — G893 Neoplasm related pain (acute) (chronic): Secondary | ICD-10-CM | POA: Diagnosis not present

## 2021-05-15 DIAGNOSIS — R079 Chest pain, unspecified: Secondary | ICD-10-CM | POA: Diagnosis not present

## 2021-05-15 DIAGNOSIS — R52 Pain, unspecified: Secondary | ICD-10-CM | POA: Diagnosis not present

## 2021-05-15 MED ORDER — FUROSEMIDE 10 MG/ML IJ SOLN
40.0000 mg | Freq: Two times a day (BID) | INTRAMUSCULAR | Status: DC
  Administered 2021-05-15 – 2021-05-22 (×15): 40 mg via INTRAVENOUS
  Filled 2021-05-15 (×15): qty 4

## 2021-05-15 NOTE — Progress Notes (Signed)
PROGRESS NOTE    Randall Larsen.  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  Patient was evaluated for urgent radiation therapy and has started radiation treatment on 05/04/2021.  Palliative care consulted to assist with the pain control currently on Dilaudid PCA.  Patient is started on methadone 10 mg 3 times daily.  Assessment & Plan:   Principal Problem:   Cancer associated pain Active Problems:   Prostate cancer ([place])   Bone metastases ([place])   Essential hypertension   Chest pain   Edema  Cancer related pain ( Metastatic Prostate Cancer): Patient with prostate cancer with metastasis to the bone. Palliative care consulted, appreciate recommendation. Continue Dilaudid PCA, Continue methadone 10 mg 3 times daily. Continue dexamethasone. Continue radiation therapy for ongoing pain, so far has completed 5 treatments.. Long-term, radiation is the best modality to help control this pain and improve his functional status however it may be difficult to control his pain until we have reached to a point when radiation begins to take effect.  Chest pain, Musculoskeletal: Likely due to bony metastasis.  Not suspicious for ACS. EKG unremarkable.  Essential hypertension: Continue metoprolol,  Continue hydralazine 50 mg TID BP remains elevated intermittently due to pain. Discontinue amlodipine due to bilateral leg swelling.  Metastatic prostate cancer Continue ongoing radiation therapy. Patient has not completed radiation therapy last 2 times. Plan is to premedicate him before having radiation treatment today.  Bilateral leg swelling: > Improving Bilateral venous duplex negative for DVT. 2D  echocardiogram LVEF 60 to 65%, no RW MA Discontinue amlodipine. Increase Lasix to 40 mg IV every 12 hours. Monitor serum creatinine.  Obesity class I: BMI 31.3. Diet and exercise discussed.  DVT prophylaxis: SCDs Code Status: Full code. Family Communication: No family at bed side. Disposition Plan:   Status is: Inpatient  Remains inpatient appropriate because:  Patient with metastatic prostate cancer to the bone presented with intractable pain.  Palliative care is helping with pain management,  Patient is on Dilaudid PCA.  Anticipated discharge to skilled nursing facility once pain is manageable with oral pain medications.  Patient is medically clear for discharge: No  Consultants:  Oncology Palliative care  Procedures: Dilaudid PCA Antimicrobials:  Anti-infectives (From admission, onward)    None       Subjective:  Patient was seen and examined at bedside.  Overnight events noted.   Patient was sitting on the chair,  reports pain is not controlled.   He is hoping that he will be premedicated before radiation treatment today. He is continued on Dilaudid PCA, also started on methadone.  Objective: Vitals:   05/15/21 0000 05/15/21 0415 05/15/21 0554 05/15/21 0753  BP:   (!) 168/76   Pulse:   70   Resp: 14 16 19 18   Temp:   98.8 F (37.1 C)   TempSrc:   Oral   SpO2: 96% 94% 100% 99%  Weight:      Height:        Intake/Output Summary (Last 24 hours) at 05/15/2021 1039 Last data filed at 05/15/2021 1014 Gross per 24 hour  Intake 1567 ml  Output 2075 ml  Net -508 ml   Filed Weights   05/01/21 2023  Weight:  99.1 kg    Examination:  General exam: Appears deconditioned, comfortable, not in any acute distress. Respiratory system: Clear to auscultation bilaterally, respiratory effort normal, RR 13 Cardiovascular system: S1-S2 heard, regular rate and rhythm, no murmur. Gastrointestinal system: Abdomen is soft, non tender, non distended, BS+ Central nervous  system: Alert and oriented X 3. No focal neurological deficits. Extremities: Bilateral pedal edema+, no cyanosis, no clubbing. Skin: No rashes, lesions or ulcers Psychiatry: Judgement and insight appear normal. Mood & affect appropriate.     Data Reviewed: I have personally reviewed following labs and imaging studies  CBC: Recent Labs  Lab 05/09/21 0855 05/13/21 0552  WBC  --  7.3  HGB 11.1* 10.0*  HCT 33.4* 31.3*  MCV  --  94.0  PLT  --  403   Basic Metabolic Panel: Recent Labs  Lab 05/10/21 0440 05/13/21 0552  NA 137 137  K 4.5 4.3  CL 108 105  CO2 22 23  GLUCOSE 111* 139*  BUN 41* 39*  CREATININE 0.93 0.95  CALCIUM 9.1 9.1  MG  --  2.5*  PHOS  --  4.4   GFR: Estimated Creatinine Clearance: 90.2 mL/min (by C-G formula based on SCr of 0.95 mg/dL). Liver Function Tests: No results for input(s): AST, ALT, ALKPHOS, BILITOT, PROT, ALBUMIN in the last 168 hours.  No results for input(s): LIPASE, AMYLASE in the last 168 hours. No results for input(s): AMMONIA in the last 168 hours. Coagulation Profile: No results for input(s): INR, PROTIME in the last 168 hours. Cardiac Enzymes: No results for input(s): CKTOTAL, CKMB, CKMBINDEX, TROPONINI in the last 168 hours.  BNP (last 3 results) No results for input(s): PROBNP in the last 8760 hours. HbA1C: No results for input(s): HGBA1C in the last 72 hours. CBG: No results for input(s): GLUCAP in the last 168 hours. Lipid Profile: No results for input(s): CHOL, HDL, LDLCALC, TRIG, CHOLHDL, LDLDIRECT in the last 72 hours. Thyroid Function Tests: No results for input(s): TSH, T4TOTAL, FREET4, T3FREE, THYROIDAB in the last 72 hours. Anemia Panel: No results for input(s): VITAMINB12, FOLATE, FERRITIN, TIBC, IRON, RETICCTPCT in the last 72 hours. Sepsis Labs: No results for input(s): PROCALCITON, LATICACIDVEN in the last 168 hours.  No results found for this or any previous visit (from the past 240 hour(s)).   Radiology  Studies: No results found.  Scheduled Meds:  acetaminophen  1,000 mg Oral TID   celecoxib  200 mg Oral BID   dexamethasone  6 mg Oral Daily   docusate sodium  100 mg Oral Q12H   enoxaparin (LOVENOX) injection  40 mg Subcutaneous Q24H   furosemide  40 mg Intravenous BID   gabapentin  300 mg Oral BID   gabapentin  600 mg Oral QHS   hydrALAZINE  50 mg Oral TID   HYDROmorphone   Intravenous Q4H   lidocaine  2 patch Transdermal Q24H   methadone  15 mg Oral Q8H   metoprolol tartrate  25 mg Oral BID   pantoprazole  40 mg Oral BID   polyethylene glycol  17 g Oral BID   potassium chloride  20 mEq Oral Daily   senna-docusate  2 tablet Oral BID   Continuous Infusions:   LOS: 13 days    Time spent: 35 mins    Galileah Piggee, MD [facility]   If 7PM-7AM, please contact night-coverage

## 2021-05-15 NOTE — TOC Progression Note (Signed)
Transition of Care (TOC) - Progression Note    Patient Details  Name: Randall Larsen. MRN: [id number] Date of Birth: 01-06-1956  [facility] CM/SW Contact  Brittinee Risk, [name], RN Phone Number: 05/15/2021, 3:48 PM  Clinical Narrative:  Noted last xrt 2/10 @ 11:45a. Will need PT/OT to see patient 2/7, will start auth on 2/8 for [facility]rep [place] following.     Expected Discharge Plan: [place] Barriers to Discharge: Continued Medical Work up  Expected Discharge Plan and Services Expected Discharge Plan: [place]   Discharge Planning Services: CM Consult   Living arrangements for the past 2 months: Single Family Home                                       Social Determinants of Health (SDOH) Interventions    Readmission Risk Interventions No flowsheet data found.

## 2021-05-15 NOTE — Progress Notes (Signed)
PT Cancellation Note  Patient Details Name: Randall Larsen. MRN: [id number] DOB: January 05, 1956   Cancelled Treatment:    Reason Eval/Treat Not Completed: Fatigue/lethargy limiting ability to participate RN reports pt trying to rest prior to radiation around 1130.   Kati L Payson 05/15/2021, 11:03 AM Arlyce Dice, DPT Acute Rehabilitation Services Pager: [phone] Office: [phone]

## 2021-05-15 NOTE — Progress Notes (Signed)
Palliative Medicine Inpatient Follow Up Note     Chart reviewed and patient examined at the bedside.   Randall Larsen is sitting up in his recliner with his legs crossed preparing to eat some ice cream. Is smiling and expressing his appreciation of a better radiation experience today. Reports pain is manageable.   Continues to complain of some pain with movement. States once he stands up and gains his balance or get in position pain will ease off. Rates his pain 3/10 currently. Education provided on goal of beginning to wean off of PCA in preparation for discharge. Will plan to make further adjustments on tomorrow with clear instructions for premedicating prior to his radiation treatments.   Reports he is sleeping much better at night.   He is hopeful to discharge to SNF later this week. Is asking if the plan is still the same location he chose. Advised TOC will further confirm discharge plans throughout the week.   Appetite is good. Ongoing support provided.   Objective Assessment: Vital Signs Vitals:   05/15/21 1955 05/15/21 2035  BP:  (!) 152/75  Pulse:  78  Resp: 17 19  Temp:  97.9 F (36.6 C)  SpO2: 97% 95%    Intake/Output Summary (Last 24 hours) at 05/15/2021 2225 Last data filed at 05/15/2021 2217 Gross per 24 hour  Intake 1470 ml  Output 2600 ml  Net -1130 ml    Last Weight  Most recent update: 05/01/2021  8:40 PM    Weight  99.1 kg (218 lb 7.6 oz)            Gen:  NAD, sitting in recliner with legs crossed CV: Normal breathing pattern  PULM: clear to auscultation bilaterally.  ABD: soft/nontender/nondistended/normal bowel sounds EXT: bilateral lower extremity edema improved Neuro: Alert and oriented x3  SUMMARY OF RECOMMENDATIONS   Continue with current plan of care per medical team  Potential SNF placement once radiation is completed on 2/10.  Symptom Management:  Pain related to neoplasm/metastatic disease PCA queried. He has utilized  18 mg of IV  Dilaudid in the last 24 hours.  Unfortunately, he is experiencing pain from bony metastasis and this can be one of the most difficult types of pain to treat.  Continue steroids.  Continue PCA with adjustments made for bolus dose of 1 mg with 30 minute lockout on 2/2    Bisphosphonates can also be beneficial, however, he has not received recent dental care and consideration of risk versus benefit with bisphosphonates is necessary, particularly in regards to risk for osteonecrosis of the jaw.  Given patient's continued high use of PCA conversions would be 113mg  of Oxycontin to convert from his IV dilaudid use. This will not be sustainable outpatient given high amounts. We will transition patient to oral methadone. We will not dose reduce for cross intolerance with hopes of continued relief and a better response. Methadone increased to 15mg  TID.  Lidocaine patch x2 every 24 hours Scheduled Tylenol 1000 mg 3 times daily Toradol 15 mg every 8 hours x 3 doses-completed Gabapentin increased to 300 twice daily in addition to 600 mg nightly. Robaxin 500mg  as needed for muscle spasm Will premedicate with robaxin prior to radiation and ativan needed for anxiety in addition to 1-2mg  bolus of dilaudid available BID.  Oral dilaudid 4mg  as needed for breakthrough  If patient's pain continue to be uncontrolled may consider one time dose of ketamine.  Long-term, radiation is the best modality to hopefully help control his  pain and improve his functional status.  My concern is that it may be difficult to control his pain until we have reached a point where radiation begins to take effect.  Constipation MiraLAX twice daily Senna-S twice daily as needed Colace twice daily  Dyspepsia Protonix 40 mg twice daily Anxiety Randall Larsen is exhibiting anxiety and anticipation that he will have pain and fear of pain not being controlled. Tearful expressing his concerns but appreciation of current comfort level.  Ativan 0.5mg   BID as needed for anxiety.   Time Total: 30 min.   Visit consisted of counseling and education dealing with the complex and emotionally intense issues of symptom management and palliative care in the setting of serious and potentially life-threatening illness.Greater than 50%  of this time was spent counseling and coordinating care related to the above assessment and plan.  Alda Lea, AGPCNP-BC  Palliative Medicine Team [phone]

## 2021-05-15 NOTE — Care Management Important Message (Signed)
Important Message  Patient Details IM Letter placed in Patients room. Name: Randall Larsen. MRN: [id number] Date of Birth: 14-Aug-1955   Medicare Important Message Given:  Yes     Kerin Salen 05/15/2021, 2:48 PM

## 2021-05-16 ENCOUNTER — Ambulatory Visit: Payer: Medicare HMO

## 2021-05-16 LAB — BASIC METABOLIC PANEL
Anion gap: 8 (ref 5–15)
BUN: 41 mg/dL — ABNORMAL HIGH (ref 8–23)
CO2: 28 mmol/L (ref 22–32)
Calcium: 9.1 mg/dL (ref 8.9–10.3)
Chloride: 105 mmol/L (ref 98–111)
Creatinine, Ser: 1.07 mg/dL (ref 0.61–1.24)
GFR, Estimated: >60 mL/min (ref >60)
Glucose, Bld: 102 mg/dL — ABNORMAL HIGH (ref 70–99)
Potassium: 4.3 mmol/L (ref 3.5–5.1)
Sodium: 141 mmol/L (ref 135–145)

## 2021-05-16 LAB — CBC
HCT: 30.7 % — ABNORMAL LOW (ref 39.0–52.0)
Hemoglobin: 10 g/dL — ABNORMAL LOW (ref 13.0–17.0)
MCH: 30.1 pg (ref 26.0–34.0)
MCHC: 32.6 g/dL (ref 30.0–36.0)
MCV: 92.5 fL (ref 80.0–100.0)
Platelets: 181 10*3/uL (ref 150–400)
RBC: 3.32 MIL/uL — ABNORMAL LOW (ref 4.22–5.81)
RDW: 17.8 % — ABNORMAL HIGH (ref 11.5–15.5)
WBC: 7.1 10*3/uL (ref 4.0–10.5)
nRBC: 4.2 % — ABNORMAL HIGH (ref 0.0–0.2)

## 2021-05-16 LAB — PHOSPHORUS: Phosphorus: 4.4 mg/dL (ref 2.5–4.6)

## 2021-05-16 LAB — MAGNESIUM: Magnesium: 2.4 mg/dL (ref 1.7–2.4)

## 2021-05-16 MED ORDER — HYDRALAZINE HCL 20 MG/ML IJ SOLN: 10.0000 mg | Freq: Three times a day (TID) | INTRAMUSCULAR | Status: DC | PRN

## 2021-05-16 NOTE — Progress Notes (Signed)
Physical Therapy Treatment Patient Details Name: Randall Larsen. 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  Patient was evaluated for urgent radiation therapy and has started radiation treatment on 05/04/2021.  Palliative care consulted to assist with the pain control currently on Dilaudid PCA.  Patient is started on methadone 15 mg 3 times daily.  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  Pt lives alone in an apartment and was Indep/driving before admit. Today pt was OOB in recliner eager to walk.  General transfer comment: safety of multiple lines and slight instability due to R hip pain.  General Gait Details: very limited anb distance due to increased R hip pain 7/10 with excessive lean on walker.  Steps are shuffled and heavy due to B LE edema Grade + 4.  Also MAX c/o fatigue after requiring an extended rest break.   Pt will need ST Rehab at SNF prior to safely returning home.   PT Comments      Recommendations for follow up therapy are one component of a multi-disciplinary discharge planning process, led by the attending physician.  Recommendations may be updated based on patient status, additional functional criteria and insurance authorization.  Follow Up Recommendations  Skilled nursing-short term rehab (<3 hours/day)     Assistance Recommended at Discharge Frequent or constant Supervision/Assistance  Patient can return home with the following A little help with walking and/or transfers;Assistance with cooking/housework;Assist for transportation;Help with stairs or ramp for entrance;A little help with bathing/dressing/bathroom   Equipment  Recommendations  Rolling walker (2 wheels)    Recommendations for Other Services       Precautions / Restrictions Precautions Precautions: Fall Precaution Comments: Bone METS ribs, pelvic, R hip     Mobility  Bed Mobility               General bed mobility comments: OOB in recliner    Transfers Overall transfer level: Needs assistance Equipment used: None Transfers: Sit to/from Stand Sit to Stand: Supervision, Min guard           General transfer comment: safety of multiple lines and slight instability due to R hip pain    Ambulation/Gait Ambulation/Gait assistance: Min assist Gait Distance (Feet): 44 Feet Assistive device: Rollator (4 wheels) Gait Pattern/deviations: Step-to pattern, Decreased stance time - right, Narrow base of support, Trunk flexed, Shuffle Gait velocity: decreased     General Gait Details: very limited anb distance due to increased R hip pain 7/10 with excessive lean on walker.  Steps are shuffled and heavy due to B LE edema Grade + 4.  Also MAX c/o fatigue after requiring an extended rest break.  .   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, radio from [employer] Comments        Pertinent Vitals/Pain Pain Assessment Pain Assessment: Faces Pain Score: 7  Pain Location: R hip Pain Descriptors / Indicators: Grimacing, Crying, Guarding Pain Intervention(s): Monitored during session, Premedicated before session, PCA encouraged, Repositioned    Home Living                           Prior Function            PT Goals (current goals can now be found in the care plan section) Progress towards PT goals: Progressing toward goals    Frequency    Min 2X/week      PT Plan Current plan remains appropriate    Co-evaluation              AM-PAC PT "6 Clicks" Mobility   Outcome Measure  Help needed turning from your back to your side while in a flat bed without using bedrails?: A Little Help needed moving from lying on your back to sitting on the side of a flat bed without using bedrails?: A Little Help needed moving to and from a bed to a chair (including a wheelchair)?: A Little Help needed standing up from a chair using your arms (e.g., wheelchair or bedside chair)?: A Lot Help needed to walk in hospital room?: A Lot Help needed climbing 3-5 steps with a railing? : Total 6 Click Score: 14    End of Session Equipment Utilized During Treatment: Gait belt Activity Tolerance: Patient limited by pain;Patient limited by fatigue Patient left: with chair alarm set;in chair;with call bell/phone within reach   PT Visit Diagnosis: Muscle weakness (generalized) (M62.81);Pain;Difficulty in walking, not elsewhere classified (R26.2) Pain - Right/Left: Right Pain - part of body: Hip     Time: 9774-1423 PT Time Calculation (min) (ACUTE ONLY): 18 min  Charges:  $Gait Training: 8-22 mins                    Rica Koyanagi  PTA Acute  Rehabilitation Services Pager      [phone] Office      [phone]

## 2021-05-16 NOTE — Plan of Care (Signed)
  Problem: Pain Managment: Goal: General experience of comfort will improve Outcome: Progressing   Problem: Safety: Goal: Ability to remain free from injury will improve Outcome: Progressing   

## 2021-05-16 NOTE — Progress Notes (Signed)
Occupational Therapy Treatment Patient Details Name: Randall Larsen. 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  Patient was evaluated for urgent radiation therapy and has started radiation treatment on 05/04/2021.  Palliative care consulted to assist with the pain control currently on Dilaudid PCA.  Patient is started on methadone 15 mg 3 times daily.   OT comments  Patient was eager to participate in session on this date with noted PCA use just prior to starting movement. Patient was educated on proper body mechanics for getting out of bed. Patient was able to don lotion to BLE with increased time and min A to maintain sitting balance when donning on LLE with noted increased pain. Patient was able to complete chair exercises to maintain strength. Patient would continue to benefit from skilled OT services at this time while admitted and after d/c to address noted deficits in order to improve overall safety and independence in ADLs.     Recommendations for follow up therapy are one component of a multi-disciplinary discharge planning process, led by the attending physician.  Recommendations may be updated based on patient status, additional functional criteria and insurance authorization.    Follow Up Recommendations  Skilled nursing-short term rehab (<3 hours/day)    Assistance Recommended at Discharge Frequent or constant Supervision/Assistance  Patient can return home with the following  A little help with walking and/or transfers;Assistance with cooking/housework;Help with stairs or ramp for entrance;A lot of help with bathing/dressing/bathroom   Equipment Recommendations  Other (comment) (total hip kit)     Recommendations for Other Services      Precautions / Restrictions Precautions Precautions: Fall Precaution Comments: Bone METS ribs, pelvic, R hip Restrictions Weight Bearing Restrictions: No       Mobility Bed Mobility                    Transfers                         Balance                                           ADL either performed or assessed with clinical judgement   ADL Overall ADL's : Needs assistance/impaired               Lower Body Bathing Details (indicate cue type and reason): patient was able to apply lotion to RLE with postion on lap with minimal reports of pain. patient was notd to have increased pain with attemptes at LLE movement with noted increased edmea in this LE. patietn was educated on keeping BLE elvated. patient verbalized understanding.       Lower Body Dressing Details (indicate cue type and reason): patient was min A to don/doff slide on shoes with heel straps               General ADL Comments: patient was educated on proper body mechanics for sleeping on back and in sidelying with pillow placement recommendations, and sitting in chair to reduce presure on back. patient verbalized understanding.    Extremity/Trunk Assessment  Vision       Perception     Praxis      Cognition Arousal/Alertness: Awake/alert Behavior During Therapy: WFL for tasks assessed/performed Overall Cognitive Status: Within Functional Limits for tasks assessed                                          Exercises Other Exercises Other Exercises: patient completed chair push ups x6 reps on this date with education on proper positioning    Shoulder Instructions       General Comments      Pertinent Vitals/ Pain       Pain Assessment Pain Assessment: Faces Faces Pain Scale: Hurts even more Pain Location: LLE, R hip and sternum Pain Descriptors / Indicators: Discomfort,  Grimacing, Guarding Pain Intervention(s): Monitored during session, PCA encouraged, Premedicated before session, Repositioned  Home Living                                          Prior Functioning/Environment              Frequency  Min 2X/week        Progress Toward Goals  OT Goals(current goals can now be found in the care plan section)  Progress towards OT goals: Progressing toward goals     Plan Discharge plan remains appropriate    Co-evaluation                 AM-PAC OT "6 Clicks" Daily Activity     Outcome Measure   Help from another person eating meals?: None Help from another person taking care of personal grooming?: A Little Help from another person toileting, which includes using toliet, bedpan, or urinal?: A Little Help from another person bathing (including washing, rinsing, drying)?: A Lot Help from another person to put on and taking off regular upper body clothing?: A Little Help from another person to put on and taking off regular lower body clothing?: A Lot 6 Click Score: 17    End of Session    OT Visit Diagnosis: Other abnormalities of gait and mobility (R26.89);Pain   Activity Tolerance Patient limited by pain   Patient Left in chair;with call bell/phone within reach   Nurse Communication Mobility status        Time: 1416-1500 OT Time Calculation (min): 44 min  Charges: OT Treatments $Self Care/Home Management : 8-22 mins $Therapeutic Activity: 23-37 mins  Jackelyn Poling OTR/L, MS Acute Rehabilitation Department Office# [phone] Pager# [phone]   Marcellina Millin 05/16/2021, 4:40 PM

## 2021-05-16 NOTE — Progress Notes (Signed)
PROGRESS NOTE    Randall Larsen.  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  Patient was evaluated for urgent radiation therapy and has started radiation treatment on 05/04/2021.  Palliative care consulted to assist with the pain control currently on Dilaudid PCA.  Patient is started on methadone 15 mg 3 times daily.  Assessment & Plan:   Principal Problem:   Cancer associated pain Active Problems:   Prostate cancer ([place])   Bone metastases ([place])   Essential hypertension   Chest pain   Edema  Cancer related pain ( Metastatic Prostate Cancer): Patient with prostate cancer with metastasis to the bone. Palliative care consulted, appreciate recommendation. Continue Dilaudid PCA, Continue methadone 15 mg 3 times daily. Continue dexamethasone, Robaxin, gabapentin, Toradol. Continue radiation therapy for ongoing pain, so far has completed 5 treatments. Premedicate with Robaxin prior to radiation and Ativan as needed for anxiety. Long-term, radiation is the best modality to help control this pain and improve his functional status however it may be difficult to control his pain until we have reached to a point when radiation begins to take effect.  Chest pain, Musculoskeletal: Likely due to bony metastasis.  Not suspicious for ACS. EKG unremarkable.  Essential hypertension: Continue metoprolol,  Continue hydralazine 50 mg TID BP remains elevated intermittently due to pain. Discontinue amlodipine due to bilateral leg swelling.  Metastatic prostate cancer Continue ongoing radiation therapy. Patient has not completed radiation therapy last 2 times. Now premedicate before having radiation  treatment working fine.  Bilateral leg swelling: > Improving Bilateral venous duplex negative for DVT. 2D echocardiogram LVEF 60 to 65%, no RWMA Discontinue amlodipine. Increase Lasix to 40 mg IV every 12 hours. Monitor serum creatinine.  Obesity class I: BMI 31.3. Diet and exercise discussed.  DVT prophylaxis: SCDs Code Status: Full code. Family Communication: No family at bed side. Disposition Plan:   Status is: Inpatient  Remains inpatient appropriate because:  Patient with metastatic prostate cancer to the bone presented with intractable pain.  Palliative care is helping with pain management,  Patient is on Dilaudid PCA.  Anticipated discharge to skilled nursing facility once pain is manageable with oral pain medications.  Patient is medically clear for discharge: No  Consultants:  Oncology Palliative care  Procedures: Dilaudid PCA Antimicrobials:  Anti-infectives (From admission, onward)    None       Subjective:  Patient was seen and examined at bedside.  Overnight events noted.   Patient was sitting on the chair,  reports pain is now improved and manageable. He is continued on Dilaudid PCA, also started on methadone.  Objective: Vitals:   05/16/21 0403 05/16/21 0450 05/16/21 0847 05/16/21 0855  BP:  (!) 163/92 (!) 159/76   Pulse:  71 71   Resp: 17 20  19   Temp:  98 F (36.7 C)    TempSrc:  Oral    SpO2: 95% 97%  98%  Weight:      Height:        Intake/Output Summary (Last 24 hours) at 05/16/2021 0950 Last data filed at 05/16/2021 0451 Gross per 24 hour  Intake 1320 ml  Output 4100 ml  Net -2780 ml   Filed Weights   05/01/21 2023  Weight: 99.1 kg    Examination:  General exam: Appears comfortable, deconditioned, not in any distress. Respiratory system: Clear to auscultation bilaterally, respiratory effort normal, RR 15 Cardiovascular system: S1-S2 heard, regular rate and rhythm, no murmur. Gastrointestinal system: Abdomen is soft, non tender,  non distended, BS+ Central nervous system: Alert and oriented X 3. No focal neurological deficits. Extremities: Bilateral pedal edema+, no cyanosis, no clubbing. Skin: No rashes, lesions or ulcers Psychiatry: Judgement and insight appear normal. Mood & affect appropriate.     Data Reviewed: I have personally reviewed following labs and imaging studies  CBC: Recent Labs  Lab 05/13/21 0552 05/16/21 0505  WBC 7.3 7.1  HGB 10.0* 10.0*  HCT 31.3* 30.7*  MCV 94.0 92.5  PLT 200 382   Basic Metabolic Panel: Recent Labs  Lab 05/10/21 0440 05/13/21 0552 05/16/21 0505  NA 137 137 141  K 4.5 4.3 4.3  CL 108 105 105  CO2 22 23 28   GLUCOSE 111* 139* 102*  BUN 41* 39* 41*  CREATININE 0.93 0.95 1.07  CALCIUM 9.1 9.1 9.1  MG  --  2.5* 2.4  PHOS  --  4.4 4.4   GFR: Estimated Creatinine Clearance: 80.1 mL/min (by C-G formula based on SCr of 1.07 mg/dL). Liver Function Tests: No results for input(s): AST, ALT, ALKPHOS, BILITOT, PROT, ALBUMIN in the last 168 hours.  No results for input(s): LIPASE, AMYLASE in the last 168 hours. No results for input(s): AMMONIA in the last 168 hours. Coagulation Profile: No results for input(s): INR, PROTIME in the last 168 hours. Cardiac Enzymes: No results for input(s): CKTOTAL, CKMB, CKMBINDEX, TROPONINI in the last 168 hours.  BNP (last 3 results) No results for input(s): PROBNP in the last 8760 hours. HbA1C: No results for input(s): HGBA1C in the last 72 hours. CBG: No results for input(s): GLUCAP in the last 168 hours. Lipid Profile: No results for input(s): CHOL, HDL, LDLCALC, TRIG, CHOLHDL, LDLDIRECT in the last 72 hours. Thyroid Function Tests: No results for input(s): TSH, T4TOTAL, FREET4, T3FREE, THYROIDAB in the last 72 hours. Anemia Panel: No results for input(s): VITAMINB12, FOLATE, FERRITIN, TIBC, IRON, RETICCTPCT in the last 72 hours. Sepsis Labs: No results for input(s): PROCALCITON, LATICACIDVEN in the last 168 hours.  No  results found for this or any previous visit (from the past 240 hour(s)).   Radiology Studies: No results found.  Scheduled Meds:  acetaminophen  1,000 mg Oral TID   celecoxib  200 mg Oral BID   dexamethasone  6 mg Oral Daily   docusate sodium  100 mg Oral Q12H   enoxaparin (LOVENOX) injection  40 mg Subcutaneous Q24H   furosemide  40 mg Intravenous BID   gabapentin  300 mg Oral BID   gabapentin  600 mg Oral QHS   hydrALAZINE  50 mg Oral TID   HYDROmorphone   Intravenous Q4H   lidocaine  2 patch Transdermal Q24H   methadone  15 mg Oral Q8H   metoprolol tartrate  25 mg Oral BID   pantoprazole  40 mg Oral BID   polyethylene glycol  17 g Oral BID   potassium chloride  20 mEq Oral Daily   senna-docusate  2 tablet Oral BID   Continuous Infusions:   LOS: 14 days    Time spent: 35 mins    Mujahid Jalomo, MD [facility]   If 7PM-7AM, please contact night-coverage

## 2021-05-17 ENCOUNTER — Ambulatory Visit: Payer: Medicare HMO

## 2021-05-17 DIAGNOSIS — R52 Pain, unspecified: Secondary | ICD-10-CM | POA: Diagnosis not present

## 2021-05-17 DIAGNOSIS — I10 Essential (primary) hypertension: Secondary | ICD-10-CM | POA: Diagnosis not present

## 2021-05-17 DIAGNOSIS — C7951 Secondary malignant neoplasm of bone: Secondary | ICD-10-CM | POA: Diagnosis not present

## 2021-05-17 DIAGNOSIS — G893 Neoplasm related pain (acute) (chronic): Secondary | ICD-10-CM | POA: Diagnosis not present

## 2021-05-17 MED ORDER — HYDROMORPHONE HCL 2 MG PO TABS
4.0000 mg | ORAL_TABLET | ORAL | Status: DC | PRN
  Administered 2021-05-18 – 2021-05-19 (×8): 6 mg via ORAL
  Administered 2021-05-19 – 2021-05-20 (×3): 4 mg via ORAL
  Administered 2021-05-22 – 2021-05-27 (×13): 6 mg via ORAL
  Administered 2021-05-27 (×2): 4 mg via ORAL
  Administered 2021-05-27 – 2021-05-30 (×14): 6 mg via ORAL
  Filled 2021-05-17 (×6): qty 3
  Filled 2021-05-17: qty 2
  Filled 2021-05-17 (×17): qty 3
  Filled 2021-05-17: qty 2
  Filled 2021-05-17 (×6): qty 3
  Filled 2021-05-17: qty 2
  Filled 2021-05-17 (×9): qty 3
  Filled 2021-05-17: qty 2

## 2021-05-17 MED ORDER — HYDROMORPHONE HCL 1 MG/ML IJ SOLN
1.0000 mg | INTRAMUSCULAR | Status: DC | PRN
  Administered 2021-05-18: 1 mg via INTRAVENOUS
  Filled 2021-05-17: qty 1

## 2021-05-17 MED ORDER — HYDROMORPHONE 1 MG/ML IV SOLN
INTRAVENOUS | Status: DC
  Administered 2021-05-17: 5 mL via INTRAVENOUS
  Administered 2021-05-18: 5 mg via INTRAVENOUS
  Administered 2021-05-18: 4 mL via INTRAVENOUS

## 2021-05-17 MED ORDER — LIP MEDEX EX OINT
TOPICAL_OINTMENT | CUTANEOUS | Status: DC | PRN
  Administered 2021-05-17: 75 via TOPICAL
  Filled 2021-05-17: qty 7

## 2021-05-17 NOTE — Progress Notes (Signed)
Physical Therapy Treatment Patient Details Name: Randall Larsen. 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  Patient was evaluated for urgent radiation therapy and has started radiation treatment on 05/04/2021.  Palliative care consulted to assist with the pain control currently on Dilaudid PCA.  Patient is started on methadone 15 mg 3 times daily.    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 B LE look even more swollen.  Assisted with amb a limited distance in hallway.  Slow but steady.  Mac c/o "heavy legs".  General Gait Details: very limited anb distance due to increased R hip pain 7/10 with excessive lean on walker.  Steps are shuffled and heavy due to B LE edema Grade + 4.  Also MAX c/o fatigue after requiring an extended rest break.  Marland Kitchen Performed some standing TE's of marching and kick backs.  Positioned in recliner with B LE elevated and applied SCD's.   Pt will need ST Rehab at SNF to address his mobility decline and functional Indep.   Recommendations for follow up therapy are one component of a multi-disciplinary discharge planning process, led by the attending physician.  Recommendations may be updated based on patient status, additional functional criteria and insurance authorization.  Follow Up Recommendations  Skilled nursing-short term rehab (<3 hours/day)     Assistance Recommended at Discharge Frequent or constant Supervision/Assistance  Patient can return home with the following A little help with walking and/or transfers;Assistance with cooking/housework;Assist for transportation;Help with stairs or ramp for entrance;A little help  with bathing/dressing/bathroom   Equipment Recommendations  Rolling walker (2 wheels)    Recommendations for Other Services       Precautions / Restrictions Precautions Precautions: Fall Precaution Comments: Bone METS ribs, pelvic, R hip, B LE edema     Mobility  Bed Mobility               General bed mobility comments: OOB in recliner    Transfers Overall transfer level: Needs assistance Equipment used: None Transfers: Sit to/from Stand Sit to Stand: Supervision, Min guard           General transfer comment: safety of multiple lines and slight instability due to R hip pain    Ambulation/Gait Ambulation/Gait assistance: Supervision, Min guard Gait Distance (Feet): 38 Feet Assistive device: Rolling walker (2 wheels) Gait Pattern/deviations: Step-to pattern, Decreased stance time - right, Narrow base of support, Trunk flexed, Shuffle Gait velocity: decreased     General Gait Details: very limited anb distance due to increased R hip pain 7/10 with excessive lean on walker.  Steps are shuffled and heavy due to B LE edema Grade + 4.  Also MAX c/o fatigue after requiring an extended rest break.  .   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, radio from [employer] Comments        Pertinent Vitals/Pain Pain Assessment Pain Assessment: No/denies pain Faces Pain Scale: Hurts even more Pain Location: LLE, R hip and sternum Pain Descriptors / Indicators: Discomfort, Grimacing, Guarding Pain Intervention(s):  Monitored during session, Premedicated before session, Repositioned    Home Living                          Prior Function            PT Goals (current goals can now be found in the care plan section) Progress towards PT goals: Progressing toward goals    Frequency    Min 2X/week      PT Plan Current plan remains appropriate    Co-evaluation              AM-PAC PT "6 Clicks" Mobility   Outcome Measure  Help needed turning from your back to your side while in a flat bed without using bedrails?: A Little Help needed moving from lying on your back to sitting on the side of a flat bed without using bedrails?: A Little Help needed moving to and from a bed to a chair (including a wheelchair)?: A Little Help needed standing up from a chair using your arms (e.g., wheelchair or bedside chair)?: A Lot Help needed to walk in hospital room?: A Lot Help needed climbing 3-5 steps with a railing? : Total 6 Click Score: 14    End of Session Equipment Utilized During Treatment: Gait belt Activity Tolerance: Patient limited by pain;Patient limited by fatigue Patient left: with chair alarm set;in chair;with call bell/phone within reach Nurse Communication: Mobility status PT Visit Diagnosis: Muscle weakness (generalized) (M62.81);Pain;Difficulty in walking, not elsewhere classified (R26.2) Pain - Right/Left: Right     Time: 6861-6837 PT Time Calculation (min) (ACUTE ONLY): 24 min  Charges:  $Gait Training: 8-22 mins $Therapeutic Exercise: 8-22 mins                     Rica Koyanagi  PTA Acute  Rehabilitation Services Pager      [phone] Office      [phone]

## 2021-05-17 NOTE — Progress Notes (Signed)
PROGRESS NOTE    Randall Larsen.  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  Patient was evaluated for urgent radiation therapy and has started radiation treatment on 05/04/2021.  Palliative care consulted to assist with the pain control currently on Dilaudid PCA.  Patient is started on methadone 15 mg 3 times daily.  Assessment & Plan:   Principal Problem:   Cancer associated pain Active Problems:   Prostate cancer ([place])   Bone metastases ([place])   Essential hypertension   Chest pain   Edema  Cancer related pain ( Metastatic Prostate Cancer): Patient with prostate cancer with metastasis to the bone. Palliative care consulted, appreciate recommendation. Continue Dilaudid PCA, Continue methadone 15 mg 3 times daily. Continue dexamethasone, Robaxin, gabapentin, and Toradol. Continue radiation therapy for ongoing pain, so far has completed 6 treatments. Premedicate with Robaxin prior to radiation and Ativan as needed for anxiety. Long-term, radiation is the best modality to help control this pain and improve his functional status however it may be difficult to control his pain until we have reached to a point when radiation begins to take effect.  Chest pain, Musculoskeletal: Likely due to bony metastasis.  Not suspicious for ACS. EKG unremarkable.  Essential hypertension: Continue metoprolol,  Continue hydralazine 50 mg TID BP remains elevated intermittently due to pain. Discontinue amlodipine due to bilateral leg swelling.  Metastatic prostate cancer Continue ongoing radiation therapy. Patient has not completed radiation therapy last 2 times. Now premedication before having radiation  treatment working fine.  Bilateral leg swelling: > Improving Bilateral venous duplex negative for DVT. 2D echocardiogram LVEF 60 to 65%, no RWMA Discontinue amlodipine. Continue Lasix to 40 mg IV every 12 hours. Monitor serum creatinine. Keep Legs elevated.  Obesity class I: BMI 31.3. Diet and exercise discussed.  DVT prophylaxis: SCDs Code Status: Full code. Family Communication: No family at bed side. Disposition Plan:   Status is: Inpatient  Remains inpatient appropriate because:  Patient with metastatic prostate cancer to the bone presented with intractable pain.  Palliative care is helping with pain management,  Patient is on Dilaudid PCA.  Anticipated discharge to skilled nursing facility once pain is manageable with oral pain medications.  Patient is medically clear for discharge: No  Consultants:  Oncology Palliative care  Procedures: Dilaudid PCA Antimicrobials:  Anti-infectives (From admission, onward)    None       Subjective:  Patient was seen and examined at bedside.  Overnight events noted.   Patient was sitting on the chair with legs elevated, states pain is now manageable. He is continued on Dilaudid PCA,  methadone dose was increased. He reports pain is manageable with premedication before radiation treatment.  Objective: Vitals:   05/17/21 0359 05/17/21 0536 05/17/21 0840 05/17/21 0928  BP:  (!) 142/73  (!) 151/71  Pulse:  72  77  Resp: 20 18 18    Temp:  98.6 F (37 C)    TempSrc:  Oral    SpO2: 97% 96% 97%   Weight:      Height:        Intake/Output Summary (Last 24 hours) at 05/17/2021 1013 Last data filed at 05/17/2021 0109 Gross per 24 hour  Intake 1080 ml  Output  3050 ml  Net -1970 ml   Filed Weights   05/01/21 2023  Weight: 99.1 kg    Examination:  General exam: Appears deconditioned, comfortable, not in any distress. Respiratory system: CTA bilaterally, respiratory effort normal, RR 15 Cardiovascular system: S1-S2 heard,  regular rate and rhythm, no murmur. Gastrointestinal system: Abdomen is soft, non tender, non distended, BS+ Central nervous system: Alert and oriented X 3. No focal neurological deficits. Extremities: Bilateral pedal edema+, no cyanosis, no clubbing. Skin: No rashes, lesions or ulcers Psychiatry: Judgement and insight appear normal. Mood & affect appropriate.     Data Reviewed: I have personally reviewed following labs and imaging studies  CBC: Recent Labs  Lab 05/13/21 0552 05/16/21 0505  WBC 7.3 7.1  HGB 10.0* 10.0*  HCT 31.3* 30.7*  MCV 94.0 92.5  PLT 200 078   Basic Metabolic Panel: Recent Labs  Lab 05/13/21 0552 05/16/21 0505  NA 137 141  K 4.3 4.3  CL 105 105  CO2 23 28  GLUCOSE 139* 102*  BUN 39* 41*  CREATININE 0.95 1.07  CALCIUM 9.1 9.1  MG 2.5* 2.4  PHOS 4.4 4.4   GFR: Estimated Creatinine Clearance: 80.1 mL/min (by C-G formula based on SCr of 1.07 mg/dL). Liver Function Tests: No results for input(s): AST, ALT, ALKPHOS, BILITOT, PROT, ALBUMIN in the last 168 hours.  No results for input(s): LIPASE, AMYLASE in the last 168 hours. No results for input(s): AMMONIA in the last 168 hours. Coagulation Profile: No results for input(s): INR, PROTIME in the last 168 hours. Cardiac Enzymes: No results for input(s): CKTOTAL, CKMB, CKMBINDEX, TROPONINI in the last 168 hours.  BNP (last 3 results) No results for input(s): PROBNP in the last 8760 hours. HbA1C: No results for input(s): HGBA1C in the last 72 hours. CBG: No results for input(s): GLUCAP in the last 168 hours. Lipid Profile: No results for input(s): CHOL, HDL, LDLCALC, TRIG, CHOLHDL, LDLDIRECT in the last 72 hours. Thyroid Function Tests: No results for input(s): TSH, T4TOTAL, FREET4, T3FREE, THYROIDAB in the last 72 hours. Anemia Panel: No results for input(s): VITAMINB12, FOLATE, FERRITIN, TIBC, IRON, RETICCTPCT in the last 72 hours. Sepsis Labs: No results for input(s): PROCALCITON,  LATICACIDVEN in the last 168 hours.  No results found for this or any previous visit (from the past 240 hour(s)).   Radiology Studies: No results found.  Scheduled Meds:  acetaminophen  1,000 mg Oral TID   celecoxib  200 mg Oral BID   dexamethasone  6 mg Oral Daily   docusate sodium  100 mg Oral Q12H   enoxaparin (LOVENOX) injection  40 mg Subcutaneous Q24H   furosemide  40 mg Intravenous BID   gabapentin  300 mg Oral BID   gabapentin  600 mg Oral QHS   hydrALAZINE  50 mg Oral TID   HYDROmorphone   Intravenous Q4H   lidocaine  2 patch Transdermal Q24H   methadone  15 mg Oral Q8H   metoprolol tartrate  25 mg Oral BID   pantoprazole  40 mg Oral BID   polyethylene glycol  17 g Oral BID   potassium chloride  20 mEq Oral Daily   senna-docusate  2 tablet Oral BID   Continuous Infusions:   LOS: 15 days    Time spent: 35 mins    Genell Thede, MD [facility]   If 7PM-7AM, please contact night-coverage

## 2021-05-17 NOTE — TOC Progression Note (Signed)
Transition of Care (TOC) - Progression Note    Patient Details  Name: Randall Larsen. MRN: [id number] Date of Birth: [birth year]  Transition of Care [facility]) CM/SW Contact  Aariana Shankland, [name], RN Phone Number: 05/17/2021, 9:46 AM  Clinical Narrative:  Rogelia Rohrer for [facility] RC#[id number] eff 2/9-2/13-rep Jackelyn Poling aware of expected d/c date 2/10 after last xrt @ 11:45a.    Expected Discharge Plan: Skilled Nursing Facility Barriers to Discharge: Insurance Authorization  Expected Discharge Plan and Services Expected Discharge Plan: [place]   Discharge Planning Services: CM Consult   Living arrangements for the past 2 months: Single Family Home                                       Social Determinants of Health (SDOH) Interventions    Readmission Risk Interventions No flowsheet data found.

## 2021-05-17 NOTE — Plan of Care (Signed)
  Problem: Coping: Goal: Level of anxiety will decrease Outcome: Progressing   Problem: Pain Managment: Goal: General experience of comfort will improve Outcome: Progressing   

## 2021-05-17 NOTE — Progress Notes (Signed)
Palliative Medicine Inpatient Follow Up Note     Chart reviewed and patient examined at the bedside.   Randall Larsen is sitting up in his recliner eating breakfast. Reports he slept well at night. Pain continues to be improved and much more controlled. Shares it is still there but tolerable. He is hopeful to discharge to facility later this week. Expresses he is following-up with TOC and the facility for further discussions and to review plans. He is tolerating radiation and is appreciative of the adjustments that have been made.   Ongoing support provided.   Objective Assessment: Vital Signs Vitals:   05/17/21 1338 05/17/21 1500  BP:    Pulse:    Resp: 18 10  Temp:    SpO2: 94% 99%    Intake/Output Summary (Last 24 hours) at 05/17/2021 1707 Last data filed at 05/17/2021 1000 Gross per 24 hour  Intake 720 ml  Output 1550 ml  Net -830 ml    Last Weight  Most recent update: 05/01/2021  8:40 PM    Weight  99.1 kg (218 lb 7.6 oz)            Gen:  NAD, sitting up in recliner CV: Normal breathing pattern  PULM: clear to auscultation bilaterally.  ABD: soft/nontender/nondistended/normal bowel sounds EXT: bilateral lower extremity edema left >right  Neuro: Alert and oriented x3  SUMMARY OF RECOMMENDATIONS   Continue with current plan of care per medical team  Potential SNF placement once radiation is completed on 2/10.  Symptom Management:  Pain related to neoplasm/metastatic disease PCA queried. He has utilized  11 mg of IV Dilaudid in the last 24 hours.  Unfortunately, he is experiencing pain from bony metastasis and this can be one of the most difficult types of pain to treat.  Continue steroids.  Continue PCA with adjustments made for bolus dose of 1 mg every hour with plans to discontinue pump on 2/8 @12pm .  IV Dilaudid available every 4 hours as needed for breakthrough if pain not relieved with oral medication.     Bisphosphonates can also be beneficial, however, he has  not received recent dental care and consideration of risk versus benefit with bisphosphonates is necessary, particularly in regards to risk for osteonecrosis of the jaw.  Given patient's continued high use of PCA conversions would be 113mg  of Oxycontin to convert from his IV dilaudid use. This will not be sustainable outpatient given high amounts. We will transition patient to oral methadone. We will not dose reduce for cross intolerance with hopes of continued relief and a better response. Methadone increased to 15mg  TID.  Lidocaine patch x2 every 24 hours Scheduled Tylenol 1000 mg 3 times daily Toradol 15 mg every 8 hours x 3 doses-completed Gabapentin increased to 300 twice daily in addition to 600 mg nightly. Robaxin 500mg  as needed for muscle spasm Will premedicate with robaxin prior to radiation and ativan needed for anxiety in addition to 1-2mg  bolus of dilaudid available BID.  Oral dilaudid increased to 4-6mg  as needed for breakthrough  If patient's pain continue to be uncontrolled may consider one time dose of ketamine.  Long-term, radiation is the best modality to hopefully help control his pain and improve his functional status.  My concern is that it may be difficult to control his pain until we have reached a point where radiation begins to take effect.  Constipation MiraLAX twice daily Senna-S twice daily as needed Colace twice daily  Dyspepsia Protonix 40 mg twice daily Anxiety Mr.  Larsen is exhibiting anxiety and anticipation that he will have pain and fear of pain not being controlled. Tearful expressing his concerns but appreciation of current comfort level.  Ativan 0.5mg  BID as needed for anxiety.   Time Total: 25 min.   Visit consisted of counseling and education dealing with the complex and emotionally intense issues of symptom management and palliative care in the setting of serious and potentially life-threatening illness.Greater than 50%  of this time was spent  counseling and coordinating care related to the above assessment and plan.  Alda Lea, AGPCNP-BC  Palliative Medicine Team [phone]

## 2021-05-18 ENCOUNTER — Ambulatory Visit: Payer: Medicare HMO

## 2021-05-18 DIAGNOSIS — R600 Localized edema: Secondary | ICD-10-CM

## 2021-05-18 DIAGNOSIS — R079 Chest pain, unspecified: Secondary | ICD-10-CM | POA: Diagnosis not present

## 2021-05-18 DIAGNOSIS — G893 Neoplasm related pain (acute) (chronic): Secondary | ICD-10-CM | POA: Diagnosis not present

## 2021-05-18 DIAGNOSIS — R52 Pain, unspecified: Secondary | ICD-10-CM | POA: Diagnosis not present

## 2021-05-18 DIAGNOSIS — C7951 Secondary malignant neoplasm of bone: Secondary | ICD-10-CM | POA: Diagnosis not present

## 2021-05-18 MED ORDER — HYDROMORPHONE HCL 1 MG/ML IJ SOLN: 1.0000 mg | INTRAMUSCULAR | Status: DC | PRN

## 2021-05-18 MED ORDER — METHADONE HCL 10 MG PO TABS
20.0000 mg | ORAL_TABLET | Freq: Three times a day (TID) | ORAL | Status: DC
  Administered 2021-05-18 – 2021-05-30 (×36): 20 mg via ORAL
  Filled 2021-05-18 (×38): qty 2

## 2021-05-18 MED ORDER — HYDROMORPHONE HCL 1 MG/ML IJ SOLN
1.0000 mg | INTRAMUSCULAR | Status: DC | PRN
  Administered 2021-05-19: 1 mg via INTRAVENOUS
  Filled 2021-05-18: qty 1

## 2021-05-18 MED ORDER — HYDROMORPHONE 1 MG/ML IV SOLN: INTRAVENOUS | Status: DC

## 2021-05-18 MED ORDER — HYDROMORPHONE 1 MG/ML IV SOLN: INTRAVENOUS | Status: AC

## 2021-05-18 MED ORDER — HYDROMORPHONE HCL 1 MG/ML IJ SOLN
1.0000 mg | Freq: Every day | INTRAMUSCULAR | Status: DC | PRN
  Filled 2021-05-18: qty 2

## 2021-05-18 NOTE — Progress Notes (Signed)
Occupational Therapy Treatment Patient Details Name: Randall Larsen. 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  Patient was evaluated for urgent radiation therapy and has started radiation treatment on 05/04/2021.  Palliative care consulted to assist with the pain control currently on Dilaudid PCA.  Patient is started on methadone 15 mg 3 times daily.   OT comments  Patient motivated and cooperative with therapy. Patient reporting sternal chest pain and needing increased time for all mobility. Patient overall min G assist for safety with ambulation to/from bathroom as he fatigues quickly. Also leaning on doorway during standing rest break. Patient min G in standing to utilize urinal and returned to recliner chair. Continue to recommend short term rehab at D/C, acute OT to follow.    Recommendations for follow up therapy are one component of a multi-disciplinary discharge planning process, led by the attending physician.  Recommendations may be updated based on patient status, additional functional criteria and insurance authorization.    Follow Up Recommendations  Skilled nursing-short term rehab (<3 hours/day)    Assistance Recommended at Discharge Frequent or constant Supervision/Assistance  Patient can return home with the following  A little help with walking and/or transfers;Assistance with cooking/housework;Help with stairs or ramp for entrance;A lot of help with bathing/dressing/bathroom   Equipment Recommendations  Other (comment) (total hip kit)       Precautions / Restrictions Precautions Precautions: Fall Precaution Comments: Bone METS ribs, pelvic, R hip, B LE edema Restrictions Weight Bearing  Restrictions: No       Mobility Bed Mobility               General bed mobility comments: OOB in recliner       Balance Overall balance assessment: Needs assistance Sitting-balance support: Feet supported Sitting balance-Leahy Scale: Good     Standing balance support: No upper extremity supported, Single extremity supported Standing balance-Leahy Scale: Fair                             ADL either performed or assessed with clinical judgement   ADL Overall ADL's : Needs assistance/impaired                         Toilet Transfer: Min guard;Ambulation;Rolling walker (2 wheels) Toilet Transfer Details (indicate cue type and reason): min G for safety as patient leaning against door frame at times, fatigues quickly Toileting- Water quality scientist and Hygiene: Min guard;Sit to/from stand Toileting - Clothing Manipulation Details (indicate cue type and reason): Patient able to stand and use urinal at min G level     Functional mobility during ADLs: Min guard;Rolling walker (2 wheels)        Cognition Arousal/Alertness: Awake/alert Behavior During Therapy: WFL for tasks assessed/performed Overall Cognitive Status: Within Functional Limits for tasks assessed                                                General Comments VSS on room air    Pertinent Vitals/ Pain  Pain Assessment Pain Assessment: Faces Faces Pain Scale: Hurts even more Pain Location: chest/sternum Pain Descriptors / Indicators: Grimacing Pain Intervention(s): PCA encouraged         Frequency  Min 2X/week        Progress Toward Goals  OT Goals(current goals can now be found in the care plan section)  Progress towards OT goals: Progressing toward goals  Acute Rehab OT Goals Patient Stated Goal: Motivated to do therapy OT Goal Formulation: With patient Time For Goal Achievement: 06/01/21 Potential to Achieve Goals: Good ADL Goals Pt  Will Perform Lower Body Dressing: with modified independence;sit to/from stand;with adaptive equipment Pt Will Transfer to Toilet: with modified independence;ambulating;regular height toilet;grab bars Pt Will Perform Toileting - Clothing Manipulation and hygiene: with modified independence;sit to/from stand Pt Will Perform Tub/Shower Transfer: with supervision;ambulating;shower seat;grab bars  Plan Discharge plan remains appropriate       AM-PAC OT "6 Clicks" Daily Activity     Outcome Measure   Help from another person eating meals?: None Help from another person taking care of personal grooming?: A Little Help from another person toileting, which includes using toliet, bedpan, or urinal?: A Little Help from another person bathing (including washing, rinsing, drying)?: A Lot Help from another person to put on and taking off regular upper body clothing?: A Little Help from another person to put on and taking off regular lower body clothing?: A Lot 6 Click Score: 17    End of Session Equipment Utilized During Treatment: Rolling walker (2 wheels)  OT Visit Diagnosis: Other abnormalities of gait and mobility (R26.89);Pain   Activity Tolerance Patient limited by pain   Patient Left in chair;with call bell/phone within reach   Nurse Communication Mobility status        Time: 8850-2774 OT Time Calculation (min): 23 min  Charges: OT General Charges $OT Visit: 1 Visit OT Treatments $Self Care/Home Management : 23-37 mins  Delbert Phenix OT OT pager: [phone]   Rosemary Holms 05/18/2021, 10:08 AM

## 2021-05-18 NOTE — Progress Notes (Signed)
PROGRESS NOTE    Randall Larsen.  GXQ:[id number] DOB: September 12, 1955 DOA: 05/01/2021 PCP: [name], MD  Chief Complaint  Patient presents with   Chest Pain    Brief Narrative:  Randall Larsen is Randall Larsen 66 y.o. 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  In th ER, rapid response had to be called due to severe chest pain.   1/24: Evaluated by Elsie Lincoln for urgent Radiation therapy, PCA started 1/26: Started radiation therapy    Assessment & Plan:   Principal Problem:   Cancer associated pain Active Problems:   Prostate cancer metastatic to bone [facility])   Edema   Chest pain   Essential hypertension   Bilateral lower extremity edema   Bone metastases (HCC)   Assessment and Plan: * Cancer associated pain- (present on admission) Pain is difficult to control Appreciate palliative care assistance - was on PCA today, plan to discontinue this today.  He's on dexamethasone, methadone, lidocaine patch, APAP scheduled, toradol scheduled, gabapentin scheduled, robaxin with premedication for radiation He's not tolerated his recent radiation sessions    Prostate cancer metastatic to bone [facility])- (present on admission) Sees Dr. Lorenso Courier as an outpatient S/p lupron 04/17/2020, continue q 3 months Plan for abiraterone 1000 mg PO daily on 2/6 (will reach out to Dr. Lorenso Courier regarding this) Stop bicalutamide per oncology   Edema Echo with EF 60-65%, no RWMA, normal RVSF Continue lasix  Chest pain- (present on admission) Suspect related to metastatic disease.  Do not suspect ACS.  Essential hypertension- (present on admission) Blood pressure still elevated Continue metoprolol, hydralazine Lasix   DVT prophylaxis: lovenox Code Status: full Family Communication: none at bedside Disposition:   Status is: Inpatient Remains  inpatient appropriate because: pending better pain control   Consultants:  Oncology palliative  Procedures:  none  Antimicrobials:  Anti-infectives (From admission, onward)    None       Subjective: C/o pain with movement  Objective: Vitals:   05/18/21 1025 05/18/21 1216 05/18/21 1301 05/18/21 1522  BP: 134/66  130/62 (!) 161/66  Pulse: 74  66 84  Resp:  18 15 16   Temp:   98.7 F (37.1 C)   TempSrc:   Oral   SpO2:  93% 98% 98%  Weight:      Height:        Intake/Output Summary (Last 24 hours) at 05/18/2021 1557 Last data filed at 05/18/2021 1523 Gross per 24 hour  Intake 451 ml  Output 2850 ml  Net -2399 ml   Filed Weights   05/01/21 2023  Weight: 99.1 kg    Examination:  General exam: Appears calm and comfortable  Respiratory system: unlabored Cardiovascular system: RRR Gastrointestinal system: Abdomen is nondistended, soft and nontender.  Central nervous system: Alert and oriented. No focal neurological deficits. Extremities: bilateral LE edema    Data Reviewed: I have personally reviewed following labs and imaging studies  CBC: Recent Labs  Lab 05/13/21 0552 05/16/21 0505  WBC 7.3 7.1  HGB 10.0* 10.0*  HCT 31.3* 30.7*  MCV 94.0 92.5  PLT 200 144    Basic Metabolic Panel: Recent Labs  Lab 05/13/21 0552 05/16/21 0505  NA 137 141  K 4.3 4.3  CL 105 105  CO2 23 28  GLUCOSE 139* 102*  BUN 39* 41*  CREATININE 0.95 1.07  CALCIUM  9.1 9.1  MG 2.5* 2.4  PHOS 4.4 4.4    GFR: Estimated Creatinine Clearance: 80.1 mL/min (by C-G formula based on SCr of 1.07 mg/dL).  Liver Function Tests: No results for input(s): AST, ALT, ALKPHOS, BILITOT, PROT, ALBUMIN in the last 168 hours.  CBG: No results for input(s): GLUCAP in the last 168 hours.   No results found for this or any previous visit (from the past 240 hour(s)).       Radiology Studies: No results found.      Scheduled Meds:  acetaminophen  1,000 mg Oral TID    celecoxib  200 mg Oral BID   dexamethasone  6 mg Oral Daily   docusate sodium  100 mg Oral Q12H   enoxaparin (LOVENOX) injection  40 mg Subcutaneous Q24H   furosemide  40 mg Intravenous BID   gabapentin  300 mg Oral BID   gabapentin  600 mg Oral QHS   hydrALAZINE  50 mg Oral TID   HYDROmorphone   Intravenous Q4H   lidocaine  2 patch Transdermal Q24H   methadone  15 mg Oral Q8H   metoprolol tartrate  25 mg Oral BID   pantoprazole  40 mg Oral BID   polyethylene glycol  17 g Oral BID   potassium chloride  20 mEq Oral Daily   senna-docusate  2 tablet Oral BID   Continuous Infusions:   LOS: 16 days    Time spent: over 30 min    Fayrene Helper, MD [facility]   To contact the attending provider between 7A-7P or the covering provider during after hours 7P-7A, please log into the web site [email or web address] and access using universal [place] password for that web site. If you do not have the password, please call the hospital operator.  05/18/2021, 3:57 PM

## 2021-05-18 NOTE — Care Management Important Message (Signed)
Important Message  Patient Details IM Letter placed in Patients room. Name: Randall Larsen. MRN: [id number] Date of Birth: 24-May-1955   Medicare Important Message Given:  Yes     Kerin Salen 05/18/2021, 1:17 PM

## 2021-05-19 ENCOUNTER — Ambulatory Visit: Payer: Medicare HMO

## 2021-05-19 DIAGNOSIS — R52 Pain, unspecified: Secondary | ICD-10-CM | POA: Diagnosis not present

## 2021-05-19 DIAGNOSIS — I10 Essential (primary) hypertension: Secondary | ICD-10-CM | POA: Diagnosis not present

## 2021-05-19 DIAGNOSIS — G893 Neoplasm related pain (acute) (chronic): Secondary | ICD-10-CM | POA: Diagnosis not present

## 2021-05-19 DIAGNOSIS — C7951 Secondary malignant neoplasm of bone: Secondary | ICD-10-CM | POA: Diagnosis not present

## 2021-05-19 LAB — CBC WITH DIFFERENTIAL/PLATELET
Abs Immature Granulocytes: 0.33 10*3/uL — ABNORMAL HIGH (ref 0.00–0.07)
Basophils Absolute: 0 10*3/uL (ref 0.0–0.1)
Basophils Relative: 0 %
Eosinophils Absolute: 0 10*3/uL (ref 0.0–0.5)
Eosinophils Relative: 0 %
HCT: 33.1 % — ABNORMAL LOW (ref 39.0–52.0)
Hemoglobin: 10.7 g/dL — ABNORMAL LOW (ref 13.0–17.0)
Immature Granulocytes: 5 %
Lymphocytes Relative: 17 %
Lymphs Abs: 1.3 10*3/uL (ref 0.7–4.0)
MCH: 30.6 pg (ref 26.0–34.0)
MCHC: 32.3 g/dL (ref 30.0–36.0)
MCV: 94.6 fL (ref 80.0–100.0)
Monocytes Absolute: 0.7 10*3/uL (ref 0.1–1.0)
Monocytes Relative: 9 %
Neutro Abs: 5 10*3/uL (ref 1.7–7.7)
Neutrophils Relative %: 69 %
Platelets: 164 10*3/uL (ref 150–400)
RBC: 3.5 MIL/uL — ABNORMAL LOW (ref 4.22–5.81)
RDW: 18.5 % — ABNORMAL HIGH (ref 11.5–15.5)
WBC: 7.3 10*3/uL (ref 4.0–10.5)
nRBC: 3.2 % — ABNORMAL HIGH (ref 0.0–0.2)

## 2021-05-19 LAB — COMPREHENSIVE METABOLIC PANEL
ALT: 22 U/L (ref 0–44)
AST: 33 U/L (ref 15–41)
Albumin: 3.9 g/dL (ref 3.5–5.0)
Alkaline Phosphatase: 274 U/L — ABNORMAL HIGH (ref 38–126)
Anion gap: 9 (ref 5–15)
BUN: 43 mg/dL — ABNORMAL HIGH (ref 8–23)
CO2: 26 mmol/L (ref 22–32)
Calcium: 9.2 mg/dL (ref 8.9–10.3)
Chloride: 104 mmol/L (ref 98–111)
Creatinine, Ser: 1.19 mg/dL (ref 0.61–1.24)
GFR, Estimated: >60 mL/min (ref >60)
Glucose, Bld: 129 mg/dL — ABNORMAL HIGH (ref 70–99)
Potassium: 3.8 mmol/L (ref 3.5–5.1)
Sodium: 139 mmol/L (ref 135–145)
Total Bilirubin: 0.2 mg/dL — ABNORMAL LOW (ref 0.3–1.2)
Total Protein: 6.6 g/dL (ref 6.5–8.1)

## 2021-05-19 LAB — MAGNESIUM: Magnesium: 2.4 mg/dL (ref 1.7–2.4)

## 2021-05-19 LAB — PHOSPHORUS: Phosphorus: 4.7 mg/dL — ABNORMAL HIGH (ref 2.5–4.6)

## 2021-05-19 MED ORDER — HYDROMORPHONE HCL 1 MG/ML IJ SOLN
1.0000 mg | INTRAMUSCULAR | Status: DC | PRN
  Administered 2021-05-19 – 2021-05-22 (×6): 2 mg via INTRAVENOUS
  Administered 2021-05-22: 1 mg via INTRAVENOUS
  Administered 2021-05-24 – 2021-05-26 (×5): 2 mg via INTRAVENOUS
  Filled 2021-05-19 (×6): qty 2
  Filled 2021-05-19: qty 1
  Filled 2021-05-19 (×6): qty 2

## 2021-05-19 NOTE — Progress Notes (Addendum)
Palliative Medicine Inpatient Follow Up Note     Chart reviewed and patient examined at the bedside.   Randall Larsen is sitting in recliner. States he is in pain, however the nurse has recently administered medication and pain is decreasing.   We discussed at length goals of discontinuing PCA to allow the team to gain better control of his pain on oral regimen. He is tearful expressing his anxiety of having pain and also with radiation. He has declined radiation several days this week due to discomfort. Expresses his pain is "devastating" when he he is transferred to the table and when in certain positions. I acknowledge his concerns and pain. Staff is premedicating him prior to radiation which he feels does help but does not sustain him when he is there. Says after 10-15 min he becomes uncomfortable.   We discussed at length the role of radiation and the need to have it daily in order for it to be effective. He expresses understanding. I advised if he is unable to tolerate their is a chance treatments could be discontinued. He does not wish for them to be discontinued and plans to hopefully remain compliant for remaining treatments.   TOC is arranging for discharge to [facility] facility at discharge. He is aware and states he is looking forward to going to facility when the time comes but with some anxiety regarding care. He expresses appreciation of the care that he is receiving while hospitalized stating he hopes they are as a tentative and focused on his needs as the medical team has been here.  We discussed at length his pain regimen with plans for PCA pump to be discontinued later this evening.  Education provided on available medications in the absence of the PCA.  He verbalized understanding and appreciation.  Reports pain does decrease when he takes oral Dilaudid however it does not work as quickly as the IV medication.  I acknowledged this with emphasis on requesting pain medication when  needed with a goal of not waiting until pain levels are severe.  [name] is appreciative of the support expressed understanding of pain regimen.  He does continue to express some anxiety with PCA being removed acknowledging he is not sure how he would do.  I again offered support and education on the use of PCA which is can consider temporary and the need to be able to manage his pain with oral medications to ensure he is in a good place when he is discharged from the hospital.  He will have IV Dilaudid available as needed for breakthrough pain not controlled with his oral medications.  Ongoing support provided.   Objective Assessment: Vital Signs Vitals:   05/19/21 0528 05/19/21 0933  BP: (!) 150/69 (!) 163/77  Pulse: 75 73  Resp: 18   Temp: 98.1 F (36.7 C)   SpO2: 95%     Intake/Output Summary (Last 24 hours) at 05/19/2021 1116 Last data filed at 05/19/2021 0943 Gross per 24 hour  Intake 366 ml  Output 3070 ml  Net -2704 ml    Last Weight  Most recent update: 05/01/2021  8:40 PM    Weight  99.1 kg (218 lb 7.6 oz)            Gen:  NAD, sitting up in recliner CV: Normal breathing pattern  PULM: clear to auscultation bilaterally.  ABD: soft/nontender/nondistended/normal bowel sounds EXT: bilateral lower extremity edema left >right  Neuro: Alert and oriented x3  SUMMARY OF RECOMMENDATIONS  Continue with current plan of care per medical team  Potential SNF placement once radiation is completed on 2/10.  Symptom Management:  Pain related to neoplasm/metastatic disease PCA queried. He has utilized  10 mg of IV Dilaudid in the last 24 hours.  Unfortunately, he is experiencing pain from bony metastasis and this can be one of the most difficult types of pain to treat.  Continue steroids.  Continue PCA with adjustments made for bolus dose of 1 mg every hour with plans to discontinue pump on 2/8 @ 8pm.  IV Dilaudid available every 3 hours as needed for breakthrough if pain not  relieved with oral medication.     Bisphosphonates can also be beneficial, however, he has not received recent dental care and consideration of risk versus benefit with bisphosphonates is necessary, particularly in regards to risk for osteonecrosis of the jaw.  Given patient's continued high use of PCA conversions would be 113mg  of Oxycontin to convert from his IV dilaudid use. This will not be sustainable outpatient given high amounts. We will transition patient to oral methadone. We will not dose reduce for cross intolerance with hopes of continued relief and a better response. Methadone increased to 20mg  TID.  Lidocaine patch x2 every 24 hours Scheduled Tylenol 1000 mg 3 times daily Toradol 15 mg every 8 hours x 3 doses-completed Gabapentin increased to 300 twice daily in addition to 600 mg nightly. Robaxin 500mg  as needed for muscle spasm Will premedicate with robaxin prior to radiation and ativan needed for anxiety in addition to 1-2mg  bolus of dilaudid available BID.  Oral dilaudid increased to 4-6mg  as needed for breakthrough  If patient's pain continue to be uncontrolled may consider one time dose of ketamine.  Long-term, radiation is the best modality to hopefully help control his pain and improve his functional status.  My concern is that it may be difficult to control his pain until we have reached a point where radiation begins to take effect.  Constipation MiraLAX twice daily Senna-S twice daily as needed Colace twice daily  Dyspepsia Protonix 40 mg twice daily Anxiety Mr. Hamon is exhibiting anxiety and anticipation that he will have pain and fear of pain not being controlled. Tearful expressing his concerns but appreciation of current comfort level.  Ativan 0.5mg  BID as needed for anxiety.   Time Total: 45 min  Visit consisted of counseling and education dealing with the complex and emotionally intense issues of symptom management and palliative care in the setting of serious  and potentially life-threatening illness.Greater than 50%  of this time was spent counseling and coordinating care related to the above assessment and plan.  Alda Lea, AGPCNP-BC  Palliative Medicine Team [phone]

## 2021-05-19 NOTE — Progress Notes (Signed)
PROGRESS NOTE    Randall Larsen.  JSH:[id number] DOB: 07/24/1955 DOA: 05/01/2021 PCP: [name], MD  Chief Complaint  Patient presents with   Chest Pain    Brief Narrative:  Randall Larsen is Randall Larsen 66 y.o. 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  He was evaluated for urgent radiation therapy and started radiation treatment on 05/04/2021.  Palliative care following and assisting with pain management.      Assessment & Plan:   Principal Problem:   Cancer associated pain Active Problems:   Prostate cancer metastatic to bone [facility])   Edema   Chest pain   Essential hypertension   Bilateral lower extremity edema   Bone metastases (HCC)   Assessment and Plan: * Cancer associated pain- (present on admission) Pain is difficult to control Appreciate palliative care assistance with adjustment of medications - PCA d/c'd 2/9, had pain throughout the night.  He's on dexamethasone, methadone, lidocaine patch, APAP scheduled, toradol scheduled, gabapentin scheduled, robaxin with premedication for radiation He's not tolerated his recent radiation sessions    Prostate cancer metastatic to bone [place])- (present on admission) Sees Dr. Lorenso Courier as an outpatient S/p lupron 04/17/2020, continue q 3 months Plan for abiraterone 1000 mg PO daily on 2/6 (will reach out to Dr. Lorenso Courier regarding this) Stop bicalutamide per oncology   Edema Echo with EF 60-65%, no RWMA, normal RVSF Continue lasix LE [place] without DVT Net negative, but still impressive edema  Chest pain- (present on admission) Suspect related to metastatic disease.  Do not suspect ACS.  Essential hypertension- (present on admission) Blood pressure still elevated Continue metoprolol, hydralazine Lasix   DVT prophylaxis: lovenox Code Status: full Family Communication: none at  bedside Disposition:   Status is: Inpatient Remains inpatient appropriate because: pending better pain control   Consultants:  Oncology palliative  Procedures:  none  Antimicrobials:  Anti-infectives (From admission, onward)    None       Subjective: Continued pain He's anxious and asking many questions about prognosis  Objective: Vitals:   05/19/21 0528 05/19/21 0933 05/19/21 1320 05/19/21 1700  BP: (!) 150/69 (!) 163/77 126/71 137/73  Pulse: 75 73 72 73  Resp: 18  18   Temp: 98.1 F (36.7 C)  98.2 F (36.8 C)   TempSrc: Oral  Oral   SpO2: 95%  93%   Weight:      Height:        Intake/Output Summary (Last 24 hours) at 05/19/2021 1711 Last data filed at 05/19/2021 0943 Gross per 24 hour  Intake 240 ml  Output 2420 ml  Net -2180 ml   Filed Weights   05/01/21 2023  Weight: 99.1 kg    Examination:  General: No acute distress. Cardiovascular: RRR Lungs: unlabored Abdomen: Soft, nontender, nondistended  Neurological: Alert and oriented 3. Moves all extremities 4. Cranial nerves II through XII grossly intact. Skin: Warm and dry. No rashes or lesions. Extremities: LE edema     Data Reviewed: I have personally reviewed following labs and imaging studies  CBC: Recent Labs  Lab 05/13/21 0552 05/16/21 0505 05/19/21 0501  WBC 7.3 7.1 7.3  NEUTROABS  --   --  5.0  HGB 10.0* 10.0* 10.7*  HCT 31.3* 30.7* 33.1*  MCV 94.0 92.5 94.6  PLT 200 181 850    Basic Metabolic Panel: Recent Labs  Lab 05/13/21 0552 05/16/21 0505 05/19/21 0501  NA 137 141 139  K 4.3 4.3 3.8  CL 105 105 104  CO2 23 28 26   GLUCOSE 139* 102* 129*  BUN 39* 41* 43*  CREATININE 0.95 1.07 1.19  CALCIUM 9.1 9.1 9.2  MG 2.5* 2.4 2.4  PHOS 4.4 4.4 4.7*    GFR: Estimated Creatinine Clearance: 72 mL/min (by C-G formula based on SCr of 1.19 mg/dL).  Liver Function Tests: Recent Labs  Lab 05/19/21 0501  AST 33  ALT 22  ALKPHOS 274*  BILITOT 0.2*  PROT 6.6  ALBUMIN  3.9    CBG: No results for input(s): GLUCAP in the last 168 hours.   No results found for this or any previous visit (from the past 240 hour(s)).       Radiology Studies: No results found.      Scheduled Meds:  acetaminophen  1,000 mg Oral TID   celecoxib  200 mg Oral BID   dexamethasone  6 mg Oral Daily   docusate sodium  100 mg Oral Q12H   enoxaparin (LOVENOX) injection  40 mg Subcutaneous Q24H   furosemide  40 mg Intravenous BID   gabapentin  300 mg Oral BID   gabapentin  600 mg Oral QHS   hydrALAZINE  50 mg Oral TID   lidocaine  2 patch Transdermal Q24H   methadone  20 mg Oral Q8H   metoprolol tartrate  25 mg Oral BID   pantoprazole  40 mg Oral BID   polyethylene glycol  17 g Oral BID   potassium chloride  20 mEq Oral Daily   senna-docusate  2 tablet Oral BID   Continuous Infusions:   LOS: 17 days    Time spent: over 30 min    Fayrene Helper, MD [facility]   To contact the attending provider between 7A-7P or the covering provider during after hours 7P-7A, please log into the web site [email or web address] and access using universal [place] password for that web site. If you do not have the password, please call the hospital operator.  05/19/2021, 5:11 PM

## 2021-05-19 NOTE — Progress Notes (Signed)
Chaplain met the patient for a follow up support, he was somewhat frustrated with the process of finding a placement for him. He also shared his determination to walk even though he was told that it might take him five weeks. He is looking forward to one day running with his grandchildren and that is a motivating factor for him. Chaplains provided emotional support and listening.   [place], [facility] Pager [phone]

## 2021-05-19 NOTE — Progress Notes (Signed)
Physical Therapy Treatment Patient Details Name: Randall Larsen. MRN: [id number] DOB: Sep 04, 1955 Today's Date: 05/19/2021   History of Present Illness This 66 years old male with PMH significant for prostate cancer metastatic to the anterior ribs, left iliac, bilateral scapula, pubic rami, bilateral proximal femurs came in the ED with worsening chest pain related to his metastatic disease.  Patient was evaluated for urgent radiation therapy and has started radiation treatment on 05/04/2021.  Palliative care consulted to assist with the pain control    PT Comments    Pt very determined to keep moving as he states his goal is to be able to walk again normally.  Pt participated as able with exercises in recliner and performed 6 mini squats from recliner utilizing upper body due to pain.  Pt reports change in pain medications, and he is attempting to perform exercises as tolerated (taking breaks as needed).  Pt with increased work of breathing with exercises and SPO2 remained in mid90s on room air during session.  After performing exercises, pt very fatigued.  Pt reports posterior LOB with getting OOB earlier (nurse tech confirms) and hopeful next visit he will tolerate standing and ambulating.  Continue to recommend SNF upon d/c.   Recommendations for follow up therapy are one component of a multi-disciplinary discharge planning process, led by the attending physician.  Recommendations may be updated based on patient status, additional functional criteria and insurance authorization.  Follow Up Recommendations  Skilled nursing-short term rehab (<3 hours/day)     Assistance Recommended at Discharge Frequent or constant Supervision/Assistance  Patient can return home with the following A little help with walking and/or transfers;Assistance with cooking/housework;Assist for transportation;Help with stairs or ramp for entrance;A little help with bathing/dressing/bathroom   Equipment Recommendations   Rolling walker (2 wheels)    Recommendations for Other Services       Precautions / Restrictions Precautions Precautions: Fall Precaution Comments: Bone mets, B LE edema     Mobility  Bed Mobility               General bed mobility comments: OOB in recliner    Transfers                        Ambulation/Gait                   Stairs             Wheelchair Mobility    Modified Rankin (Stroke Patients Only)       Balance                                            Cognition Arousal/Alertness: Awake/alert Behavior During Therapy: WFL for tasks assessed/performed Overall Cognitive Status: Within Functional Limits for tasks assessed                                          Exercises General Exercises - Lower Extremity Ankle Circles/Pumps: Both, 10 reps, Seated, AROM Heel Slides: AROM, Seated, 10 reps, Both Hip Flexion/Marching: AROM, Both, 10 reps, Seated Toe Raises: AROM, Both, 15 reps, Seated Heel Raises: AROM, Both, 15 reps, Seated Mini-Sqauts: AROM, Both, 5 reps    General Comments  Pertinent Vitals/Pain Pain Assessment Pain Assessment: Faces Faces Pain Scale: Hurts whole lot Pain Location: left hip/pelvis with activity Pain Descriptors / Indicators: Grimacing Pain Intervention(s): Repositioned, Monitored during session    Home Living                          Prior Function            PT Goals (current goals can now be found in the care plan section) Acute Rehab PT Goals PT Goal Formulation: With patient Time For Goal Achievement: 06/02/21 Potential to Achieve Goals: Good Progress towards PT goals: Progressing toward goals    Frequency    Min 2X/week      PT Plan Current plan remains appropriate    Co-evaluation              AM-PAC PT "6 Clicks" Mobility   Outcome Measure  Help needed turning from your back to your side while in a flat bed  without using bedrails?: A Little Help needed moving from lying on your back to sitting on the side of a flat bed without using bedrails?: A Little Help needed moving to and from a bed to a chair (including a wheelchair)?: A Lot Help needed standing up from a chair using your arms (e.g., wheelchair or bedside chair)?: A Lot Help needed to walk in hospital room?: A Lot Help needed climbing 3-5 steps with a railing? : Total 6 Click Score: 13    End of Session   Activity Tolerance: Patient tolerated treatment well Patient left: in chair;with call bell/phone within reach Nurse Communication: Mobility status PT Visit Diagnosis: Muscle weakness (generalized) (M62.81);Difficulty in walking, not elsewhere classified (R26.2)     Time: 3338-3291 PT Time Calculation (min) (ACUTE ONLY): 30 min  Charges:  $Therapeutic Exercise: 23-37 mins                    Jannette Spanner PT, DPT Acute Rehabilitation Services Pager: [phone] Office: [place] 05/19/2021, 3:45 PM

## 2021-05-19 NOTE — Progress Notes (Signed)
PCA was discontinued around 2000. Pt has been sitting on recliner all night long. Pt kept complaining of severe pain (9 or 10/ 10) on his chest through the night. PO Dilaudid  were given to control pain. At 0548, pt complained severe pain again after having PO Dilaudid at 0242, so Dilaudid injection was given. Pt is finally sleeping in the bed. Berton Mount, Therapist, sports.

## 2021-05-19 NOTE — TOC Progression Note (Addendum)
Transition of Care (TOC) - Progression Note    Patient Details  Name: Randall Larsen. MRN: [id number] Date of Birth: 01-01-1956  Transition of Care [facility] CM/SW Contact  Toan Mort, [name], RN Phone Number: 05/19/2021, 12:12 PM  Clinical Narrative: See all prior notes for additional info. [facility] if can accept over weekend;we have auth till 2/13;[facility] if d/c [facility] going to [employer]       Expected Discharge Plan: [place] Barriers to Discharge: Continued Medical Work up  Expected Discharge Plan and Services Expected Discharge Plan: [place]   Discharge Planning Services: CM Consult   Living arrangements for the past 2 months: Single Family Home                                       Social Determinants of Health (SDOH) Interventions    Readmission Risk Interventions No flowsheet data found.

## 2021-05-19 NOTE — Progress Notes (Signed)
Palliative Medicine Inpatient Follow Up Note     Chart reviewed and patient examined at the bedside.   Randall Larsen had a rough night gaining pain control once PCA was removed. Eventually achieved some relief allowing him to get some rest in the bed after receiving medications consecutively.  He was able to sleep in his bed which she does not do usually due to pain of lying down.  This morning during my visit he is sitting on the side of the bed preparing to take his morning medications and eat breakfast.  Reports he is still having some pain but this is much improved compared to the pain he was having late last night.  He states "I survived without the PCA".  We discussed at length his current pain regimen with acknowledgment of dosage increased on his methadone and Dilaudid.  Reports he does have some relief when taking his medication.  He is asking if he was going to rehab today. Advised I am unsure and that we want to make sure when he does believe he is in a functional state and does not have to return back to the hospital.  He verbalized understanding and appreciation.  All questions answered and support provided.   Objective Assessment: Vital Signs Vitals:   05/19/21 0528 05/19/21 0933  BP: (!) 150/69 (!) 163/77  Pulse: 75 73  Resp: 18   Temp: 98.1 F (36.7 C)   SpO2: 95%     Intake/Output Summary (Last 24 hours) at 05/19/2021 1128 Last data filed at 05/19/2021 3299 Gross per 24 hour  Intake 366 ml  Output 3070 ml  Net -2704 ml    Last Weight  Most recent update: 05/01/2021  8:40 PM    Weight  99.1 kg (218 lb 7.6 oz)            Gen:  NAD, sitting on the side of bed, occasional grimacing CV: Normal breathing pattern  PULM: clear to auscultation bilaterally.  ABD: soft/nontender/nondistended/normal bowel sounds EXT: bilateral lower extremity edema left >right, scrotal edema Neuro: Alert and oriented x3  SUMMARY OF RECOMMENDATIONS   Continue with current plan of care  per medical team  Potential SNF placement once medically stable  Symptom Management:  Pain related to neoplasm/metastatic disease PCA discontinued on 2/9 and 2000. IV Dilaudid available every 3 hours as needed for breakthrough if pain not relieved with oral medication.     Bisphosphonates can also be beneficial, however, he has not received recent dental care and consideration of risk versus benefit with bisphosphonates is necessary, particularly in regards to risk for osteonecrosis of the jaw.  Given patient's continued high use of PCA conversions would be 113mg  of Oxycontin to convert from his IV dilaudid use. This will not be sustainable outpatient given high amounts. We will transition patient to oral methadone. We will not dose reduce for cross intolerance with hopes of continued relief and a better response. Methadone increased to 20mg  TID.  Lidocaine patch x2 every 24 hours Scheduled Tylenol 1000 mg 3 times daily Toradol 15 mg every 8 hours x 3 doses-completed Gabapentin increased to 300 twice daily in addition to 600 mg nightly. Robaxin 500mg  as needed for muscle spasm Will premedicate with robaxin prior to radiation and ativan needed for anxiety in addition to 1-2mg  bolus of dilaudid available BID.  Oral dilaudid increased to 4-6mg  as needed for breakthrough  If patient's pain continue to be uncontrolled may consider one time dose of ketamine.  Long-term, radiation  is the best modality to hopefully help control his pain and improve his functional status.  My concern is that it may be difficult to control his pain until we have reached a point where radiation begins to take effect.  Constipation MiraLAX twice daily Senna-S twice daily as needed Colace twice daily  Dyspepsia Protonix 40 mg twice daily Anxiety Mr. Vanderpol is exhibiting anxiety and anticipation that he will have pain and fear of pain not being controlled. Tearful expressing his concerns but appreciation of current  comfort level.  Ativan 0.5mg  BID as needed for anxiety.   Time Total: 35 min  Visit consisted of counseling and education dealing with the complex and emotionally intense issues of symptom management and palliative care in the setting of serious and potentially life-threatening illness.Greater than 50%  of this time was spent counseling and coordinating care related to the above assessment and plan.  Alda Lea, AGPCNP-BC  Palliative Medicine Team [phone]

## 2021-05-20 ENCOUNTER — Inpatient Hospital Stay (HOSPITAL_COMMUNITY): Payer: Medicare HMO

## 2021-05-20 DIAGNOSIS — W19XXXA Unspecified fall, initial encounter: Secondary | ICD-10-CM

## 2021-05-20 DIAGNOSIS — R338 Other retention of urine: Secondary | ICD-10-CM

## 2021-05-20 DIAGNOSIS — G893 Neoplasm related pain (acute) (chronic): Secondary | ICD-10-CM | POA: Diagnosis not present

## 2021-05-20 DIAGNOSIS — G8220 Paraplegia, unspecified: Secondary | ICD-10-CM

## 2021-05-20 LAB — PHOSPHORUS: Phosphorus: 4.9 mg/dL — ABNORMAL HIGH (ref 2.5–4.6)

## 2021-05-20 LAB — COMPREHENSIVE METABOLIC PANEL
ALT: 19 U/L (ref 0–44)
AST: 21 U/L (ref 15–41)
Albumin: 3.4 g/dL — ABNORMAL LOW (ref 3.5–5.0)
Alkaline Phosphatase: 244 U/L — ABNORMAL HIGH (ref 38–126)
Anion gap: 7 (ref 5–15)
BUN: 46 mg/dL — ABNORMAL HIGH (ref 8–23)
CO2: 27 mmol/L (ref 22–32)
Calcium: 8.9 mg/dL (ref 8.9–10.3)
Chloride: 104 mmol/L (ref 98–111)
Creatinine, Ser: 0.82 mg/dL (ref 0.61–1.24)
GFR, Estimated: >60 mL/min (ref >60)
Glucose, Bld: 104 mg/dL — ABNORMAL HIGH (ref 70–99)
Potassium: 3.6 mmol/L (ref 3.5–5.1)
Sodium: 138 mmol/L (ref 135–145)
Total Bilirubin: 0.3 mg/dL (ref 0.3–1.2)
Total Protein: 5.4 g/dL — ABNORMAL LOW (ref 6.5–8.1)

## 2021-05-20 LAB — URINALYSIS, COMPLETE (UACMP) WITH MICROSCOPIC
Bacteria, UA: NONE SEEN
Bilirubin Urine: NEGATIVE
Glucose, UA: NEGATIVE mg/dL
Hgb urine dipstick: NEGATIVE
Ketones, ur: NEGATIVE mg/dL
Leukocytes,Ua: NEGATIVE
Nitrite: NEGATIVE
Protein, ur: NEGATIVE mg/dL
Specific Gravity, Urine: 1.018 (ref 1.005–1.030)
pH: 5 (ref 5.0–8.0)

## 2021-05-20 LAB — CBC WITH DIFFERENTIAL/PLATELET
Abs Immature Granulocytes: 0.23 10*3/uL — ABNORMAL HIGH (ref 0.00–0.07)
Basophils Absolute: 0 10*3/uL (ref 0.0–0.1)
Basophils Relative: 0 %
Eosinophils Absolute: 0 10*3/uL (ref 0.0–0.5)
Eosinophils Relative: 0 %
HCT: 29.4 % — ABNORMAL LOW (ref 39.0–52.0)
Hemoglobin: 9.6 g/dL — ABNORMAL LOW (ref 13.0–17.0)
Immature Granulocytes: 3 %
Lymphocytes Relative: 14 %
Lymphs Abs: 1 10*3/uL (ref 0.7–4.0)
MCH: 30.6 pg (ref 26.0–34.0)
MCHC: 32.7 g/dL (ref 30.0–36.0)
MCV: 93.6 fL (ref 80.0–100.0)
Monocytes Absolute: 0.8 10*3/uL (ref 0.1–1.0)
Monocytes Relative: 11 %
Neutro Abs: 5.3 10*3/uL (ref 1.7–7.7)
Neutrophils Relative %: 72 %
Platelets: 143 10*3/uL — ABNORMAL LOW (ref 150–400)
RBC: 3.14 MIL/uL — ABNORMAL LOW (ref 4.22–5.81)
RDW: 18.3 % — ABNORMAL HIGH (ref 11.5–15.5)
WBC: 7.4 10*3/uL (ref 4.0–10.5)
nRBC: 2.3 % — ABNORMAL HIGH (ref 0.0–0.2)

## 2021-05-20 LAB — MAGNESIUM: Magnesium: 2.3 mg/dL (ref 1.7–2.4)

## 2021-05-20 IMAGING — MR MR THORACIC SPINE W/O CM
7 series · 44 of 48 positions shown · non-contrast
Comparison: CT from [DATE].

CLINICAL DATA: Initial evaluation for acute mid back pain, history
of metastatic prostate cancer.

EXAM:
MRI THORACIC SPINE WITHOUT CONTRAST
TECHNIQUE: Multiplanar, multisequence MR imaging of the thoracic spine was
performed. No intravenous contrast was administered.

[Series 16: T1 · sagittal · 4.0mm · 1.72mm/px · 1 of 5 slices shown (1 of 2)]
[im 1/5]
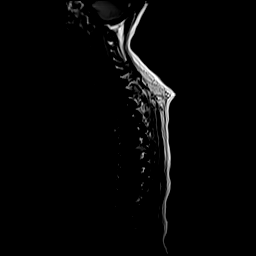

[Series 17: STIR · sagittal · 3.0mm · 1.00mm/px · 4 of 19 slices shown]
[im 1/19]
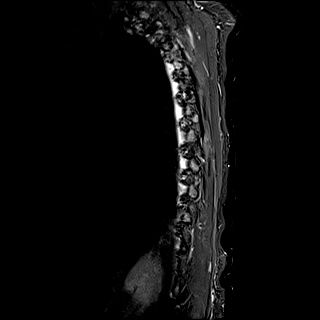
[im 7/19]
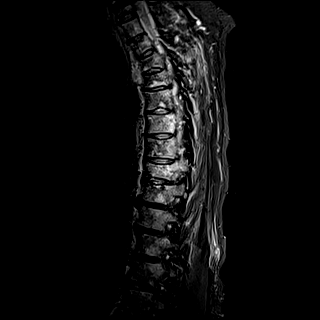
[im 13/19]
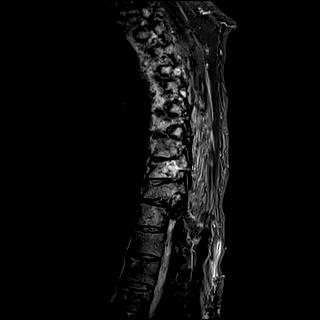
[im 19/19]
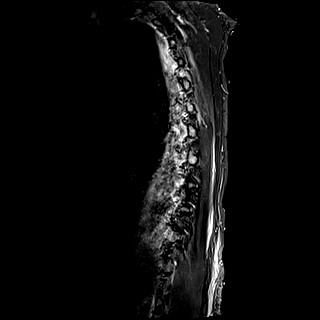

[Series 18: T1 · sagittal · 3.0mm · 1.00mm/px · 5 of 19 slices shown (2 of 2)]
[im 1/19]
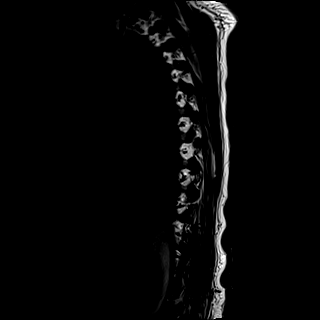
[im 5/19]
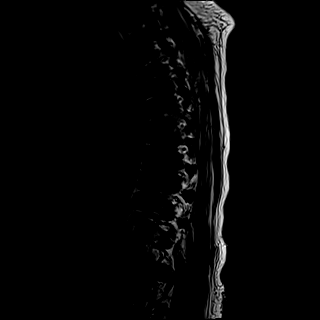
[im 10/19]
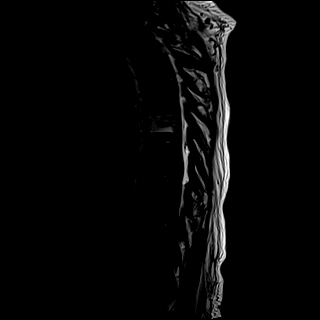
[im 14/19]
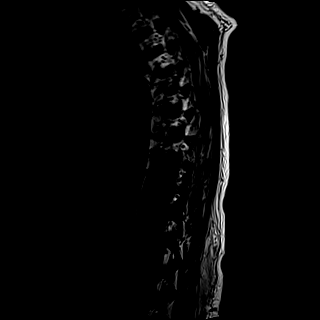
[im 19/19]
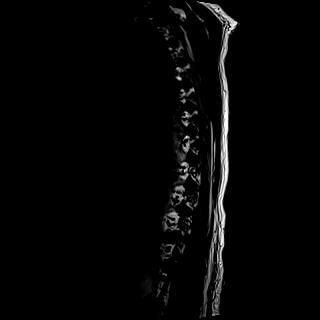

[Series 19: T2 · sagittal · 3.0mm · 0.83mm/px · 5 of 19 slices shown (1 of 3)]
[im 1/19]
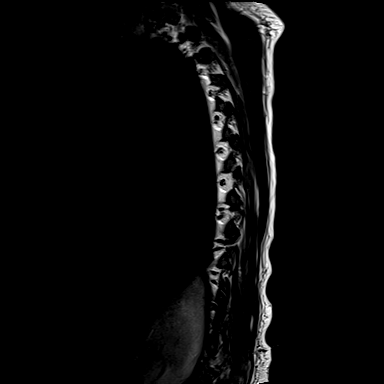
[im 5/19]
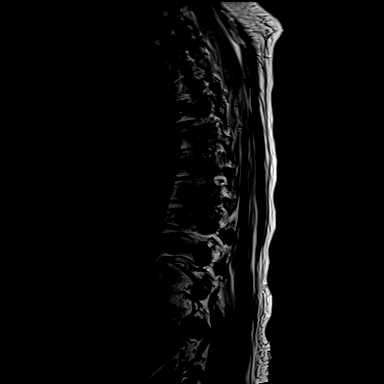
[im 10/19]
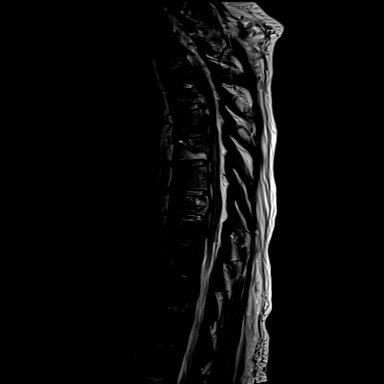
[im 14/19]
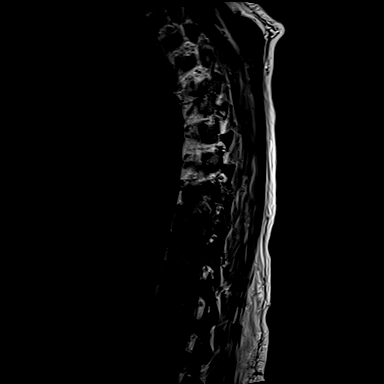
[im 19/19]
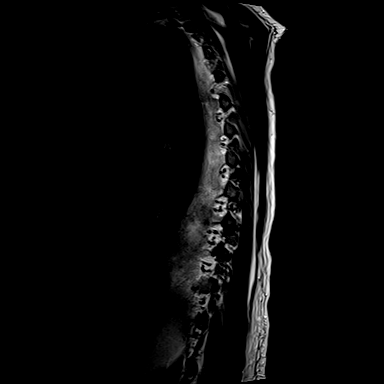

[Series 20: T2 · axial · 4.0mm · 0.78mm/px · z∈[-303,-68]mm · 9 of 39 slices shown (2 of 3)]
[im 1/39]
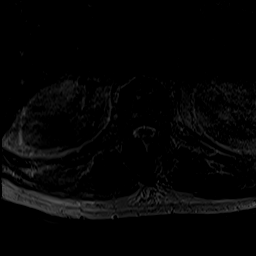
[im 5/39]
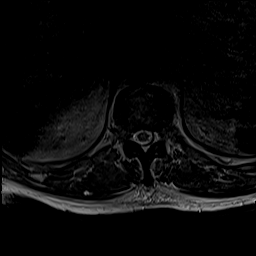
[im 10/39]
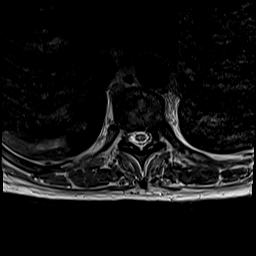
[im 15/39]
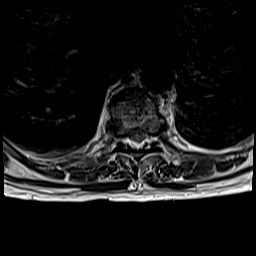
[im 20/39]
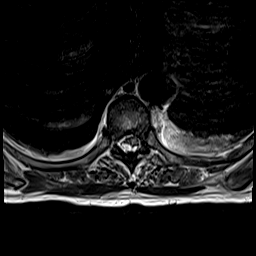
[im 24/39]
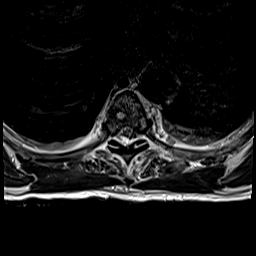
[im 29/39]
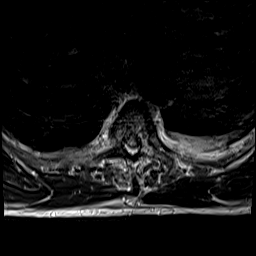
[im 34/39]
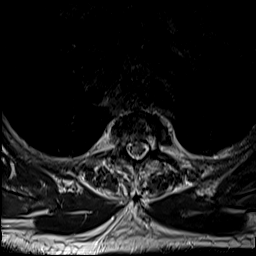
[im 39/39]
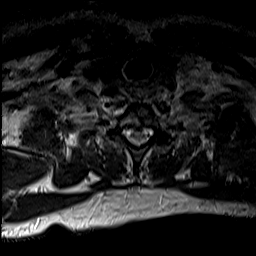

[Series 21: T2 · axial · 4.0mm · 0.78mm/px · z∈[-308,-45]mm · 12 of 50 slices shown (3 of 3)]
[im 1/50]
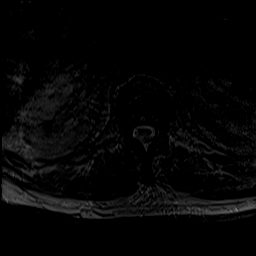
[im 5/50]
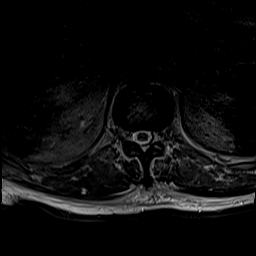
[im 9/50]
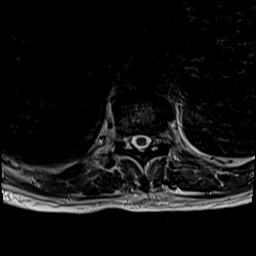
[im 14/50]
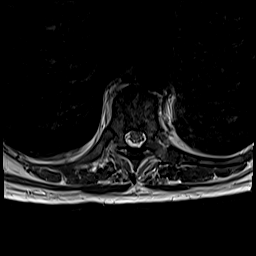
[im 18/50]
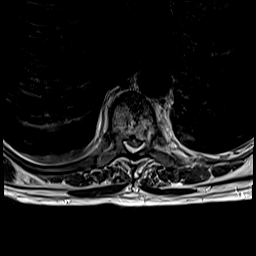
[im 23/50]
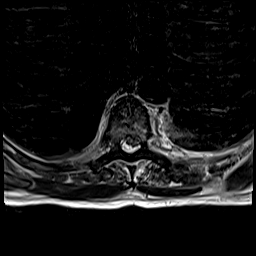
[im 27/50]
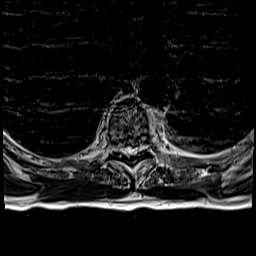
[im 32/50]
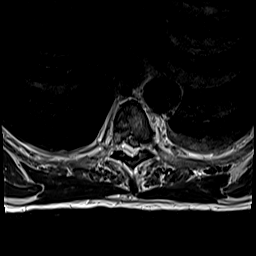
[im 36/50]
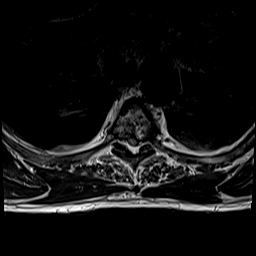
[im 41/50]
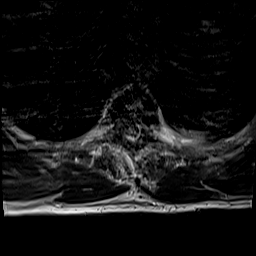
[im 45/50]
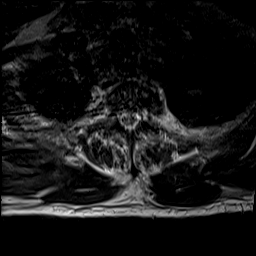
[im 50/50]
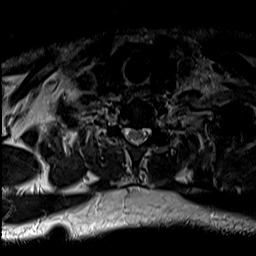

[Series 22: t2_me2d_tra · axial · 4.0mm · 0.52mm/px · z∈[-308,-45]mm · 8 of 50 slices shown]
[im 1/50]
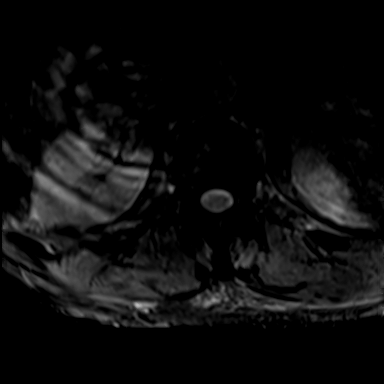
[im 9/50]
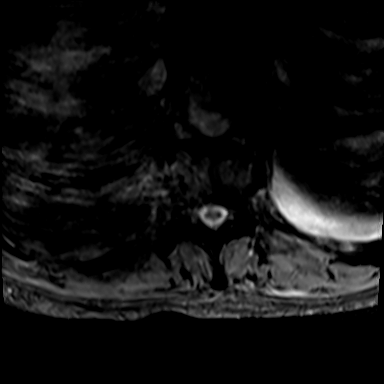
[im 14/50]
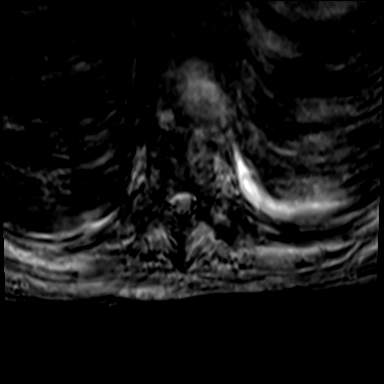
[im 23/50]
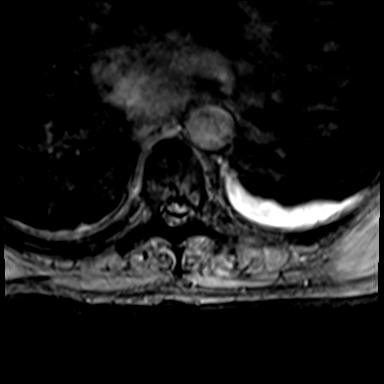
[im 27/50]
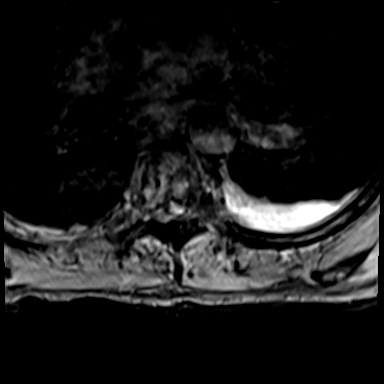
[im 36/50]
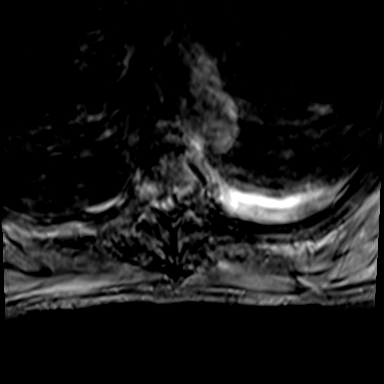
[im 41/50]
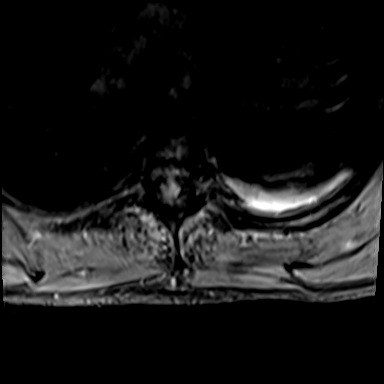
[im 50/50]
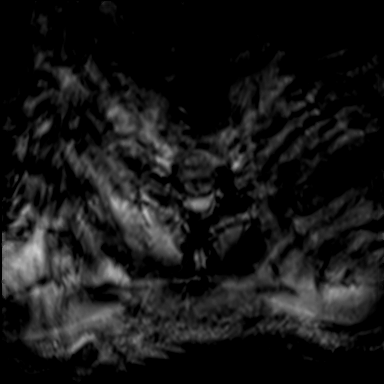

[44 of 48 positions shown; findings below may reference images not displayed]

FINDINGS: Alignment:  Examination degraded by motion artifact.

Mild dextroscoliosis. Alignment otherwise normal with preservation
of the normal thoracic kyphosis. No listhesis.

Vertebrae: Innumerable osseous metastases seen throughout the
thoracic spine, consistent with history of metastatic prostate
cancer. There is involvement of essentially all levels, with diffuse
involvement of the visualized ribs. Since previous exam, there is
progressive height loss at the T2, T3, T4, T5, T8, T9, T10, T11, and
T12 vertebral bodies, consistent with interval pathologic
compression fractures. Associated bony retropulsion and/or abnormal
convex posterior contour now seen at multiple levels, most
pronounced at T2, T3, T4, T6, and T8.

Cord: Grossly normal signal and morphology. No appreciable cord
signal changes on this motion degraded exam.

Paraspinal and other soft tissues: Mild edema seen throughout the
upper and mid paraspinous soft tissues, presumably reactive. No
collections. Small layering bilateral pleural effusions, left
greater than right.

Undo that

Disc levels:

No significant disc pathology seen within the thoracic spine.
However, the bony retropulsion and/or posterior convex bowing seen
at multiple levels extending from T2 through T9, in combination with
prominence of the dorsal epidural fat results in mild to moderate
diffuse spinal stenosis, most pronounced at T2 through T4 and T8.
Mild flattening of the thoracic cord at several levels, most
pronounced at T4 and T8, but no visible cord signal changes. Mild to
moderate bilateral bony foraminal narrowing present at T1-2 through
T5-6, most pronounced at T4-5 bilaterally. Otherwise, no foramina
remain patent.
IMPRESSION: 1. Innumerable osseous metastases throughout the thoracic spine,
consistent with history of metastatic prostate cancer. Progressive
height loss at the T2, T3, T4, T5, T8, T9, T10, T11, and T12
vertebral bodies, consistent with interval pathologic compression
fractures. Associated bony retropulsion and/or abnormal convex
posterior contour now seen at multiple levels, most pronounced at T2
through T8.
2. Mild to moderate diffuse spinal stenosis extending from T2
through T9, most pronounced at T2 through T4 and T8.
3. Mild to moderate bilateral bony foraminal narrowing at T1-2
through T5-6, most pronounced at T4-5.
4. Small layering bilateral pleural effusions, left greater than
right.

## 2021-05-20 IMAGING — DX DG HIP (WITH OR WITHOUT PELVIS) 2V BILAT
5 series · 5 of 5 positions shown · non-contrast
Comparison: No priors.

CLINICAL DATA: 65-year-old male with history of trauma from a fall
with bilateral hip pain. History of prostate cancer.

EXAM:
DG HIP (WITH OR WITHOUT PELVIS) 2V BILAT

[pelvis ap]
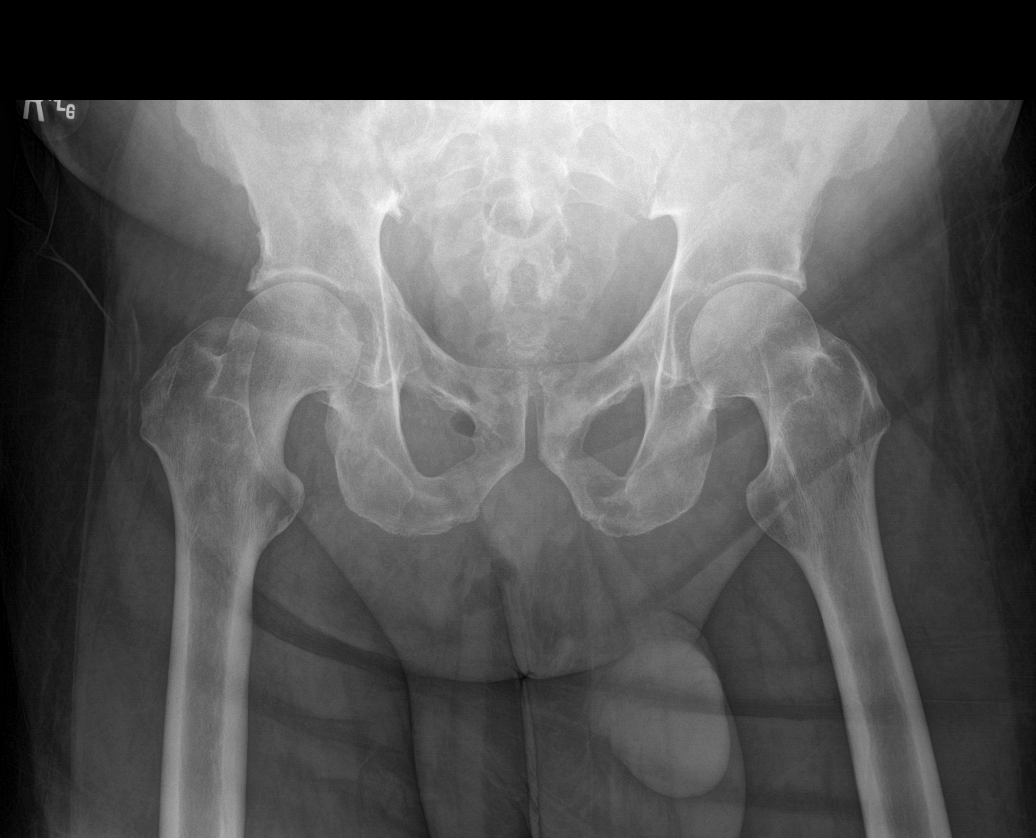

[hip ap (1 of 2)]
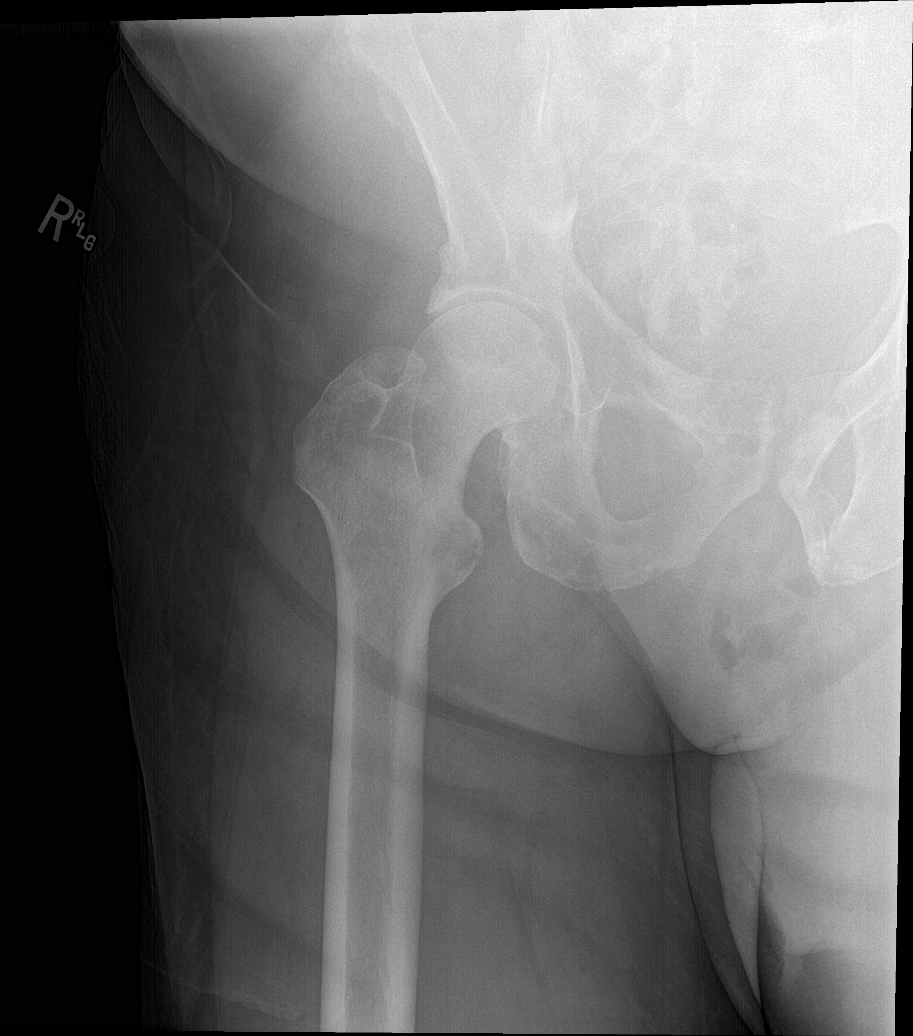

[hip lat (1 of 2)]
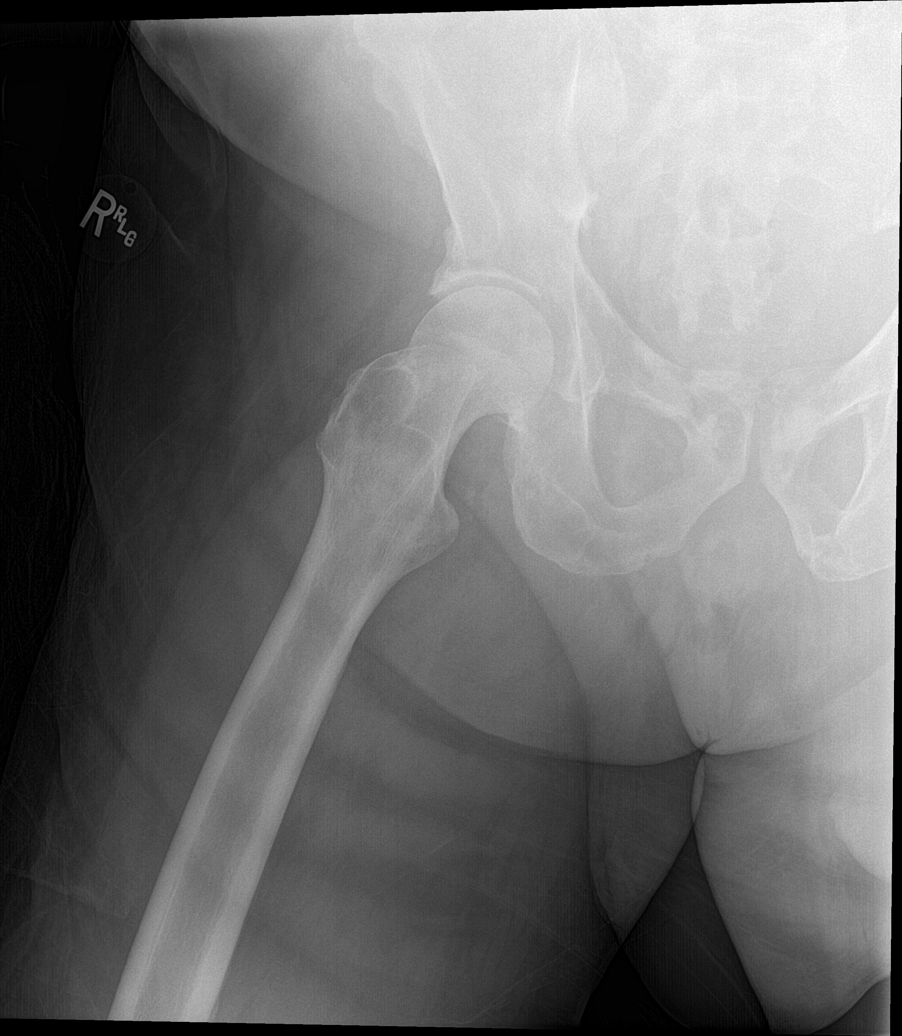

[hip ap (2 of 2)]
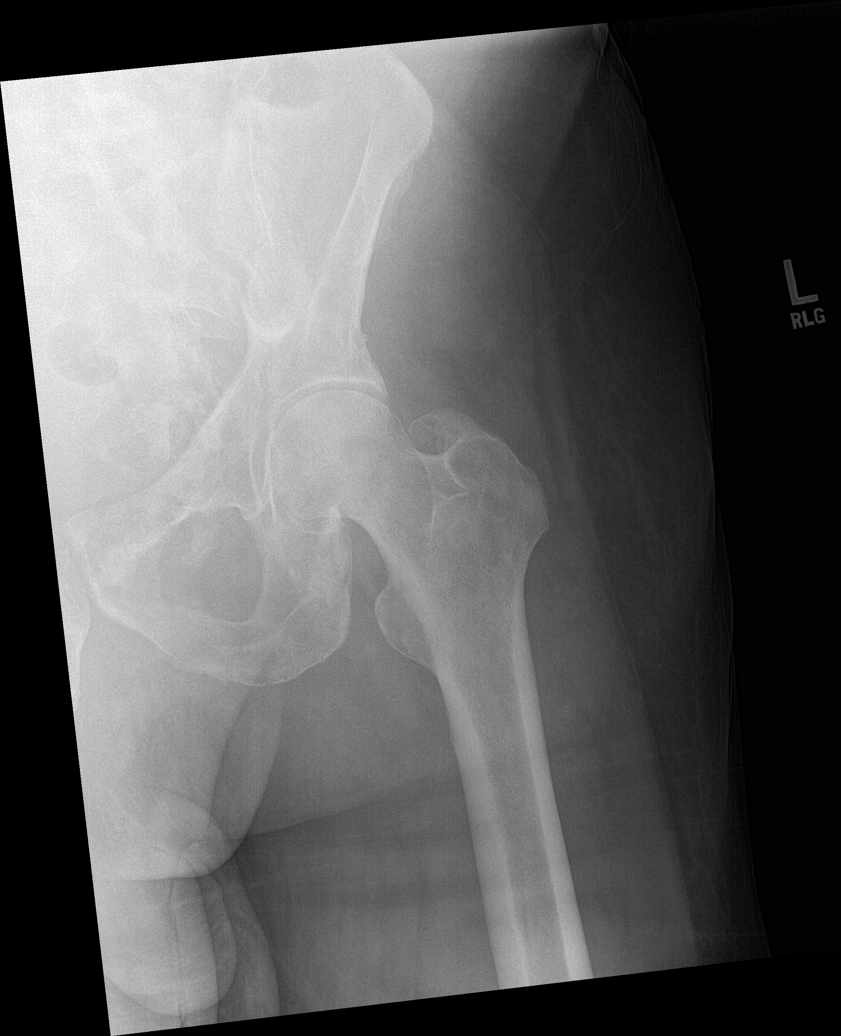

[hip lat (2 of 2)]
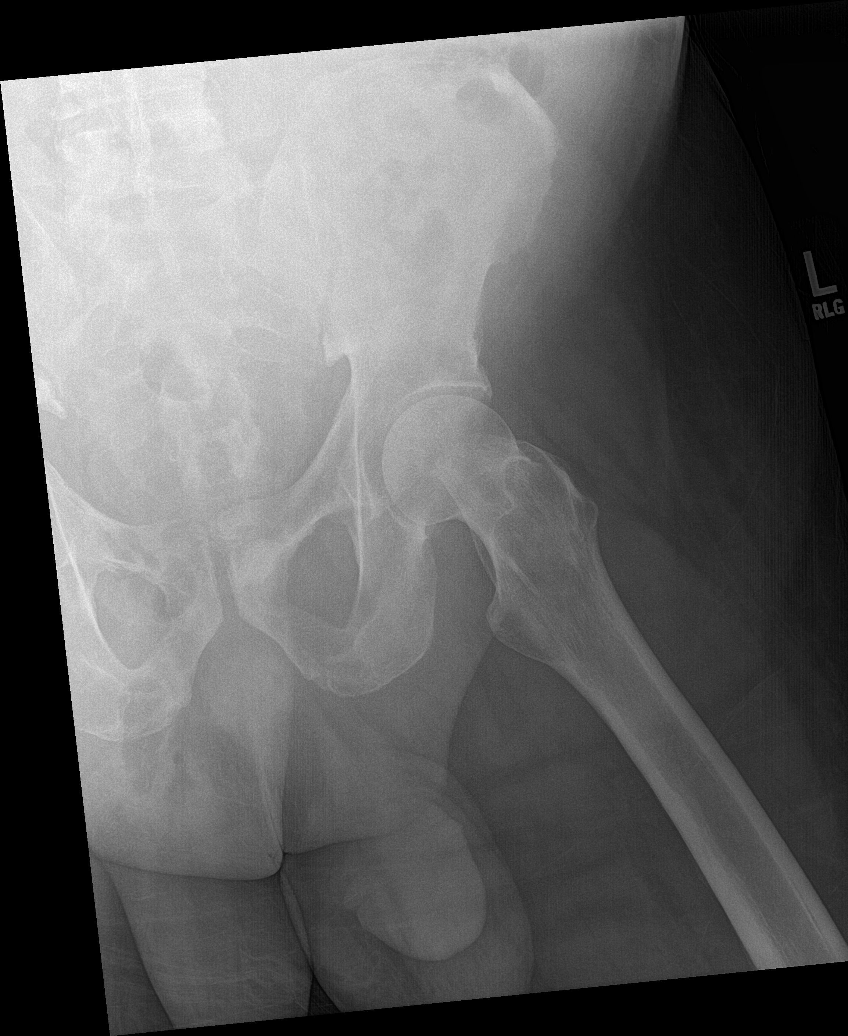

[5 of 5 positions shown; findings below may reference images not displayed]

FINDINGS: Five views of the bony pelvis and bilateral hips demonstrate no
acute displaced fracture, subluxation or dislocation. There is joint
space narrowing, subchondral sclerosis and osteophyte formation
associated with both hip joints, indicative of moderate
osteoarthritis. Innumerable poorly defined sclerotic areas are noted
throughout the visualized portions of the axial and appendicular
skeleton, indicative of widespread metastatic disease to the bones.
IMPRESSION: 1. No acute radiographic abnormality of the bony pelvis.
2. Widespread metastatic disease to the bones.
3. Moderate bilateral hip joint osteoarthritis.

## 2021-05-20 IMAGING — MR MR LUMBAR SPINE W/O CM
4 of 5 series · 30 of 48 positions shown · non-contrast
Comparison: Lumbar spine CT [DATE]. CT abdomen and pelvis
[DATE].

CLINICAL DATA: Low back pain, cauda equina syndrome suspected.
Weakness and numbness in the lower extremities. Metastatic prostate
cancer.

EXAM:
MRI LUMBAR SPINE WITHOUT CONTRAST
TECHNIQUE: Multiplanar, multisequence MR imaging of the lumbar spine was
performed. No intravenous contrast was administered.

[Series 16: T1 · sagittal · 4.0mm · 0.81mm/px · 6 of 17 slices shown (1 of 2)]
[im 1/17]
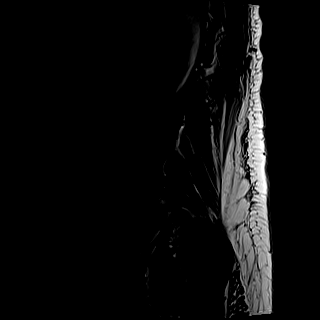
[im 4/17]
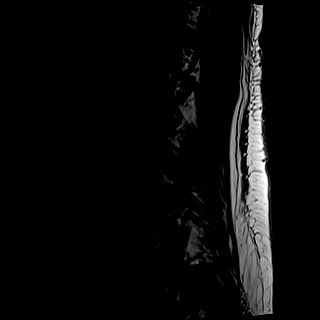
[im 7/17]
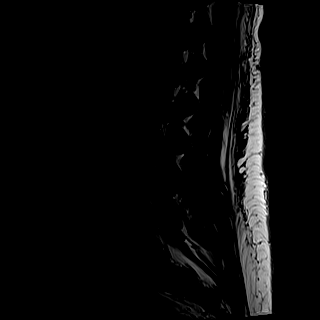
[im 10/17]
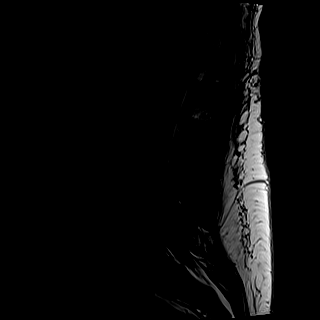
[im 13/17]
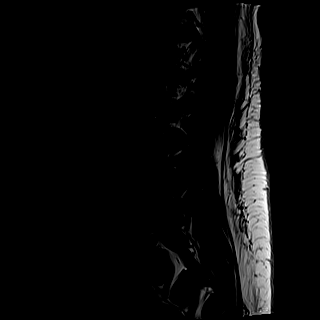
[im 17/17]
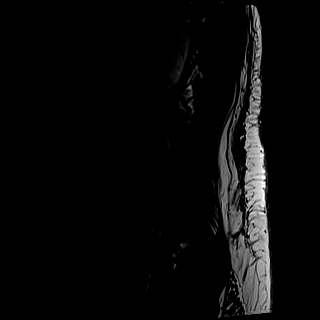

[Series 18: T2 post-contrast · sagittal · 4.0mm · 0.81mm/px · 6 of 17 slices shown]
[im 1/17]
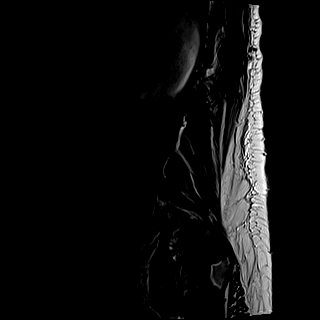
[im 4/17]
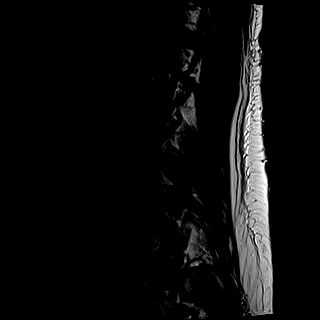
[im 7/17]
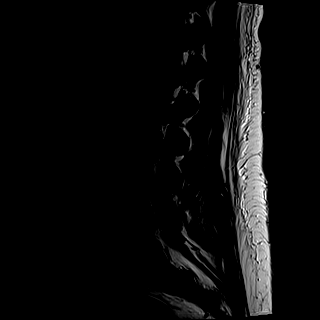
[im 10/17]
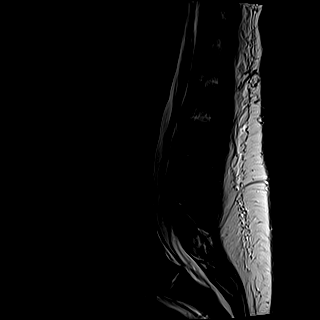
[im 13/17]
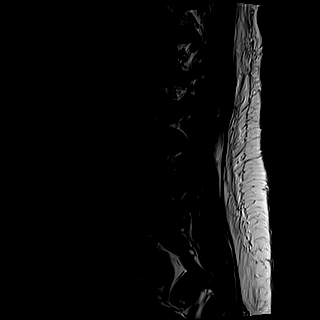
[im 17/17]
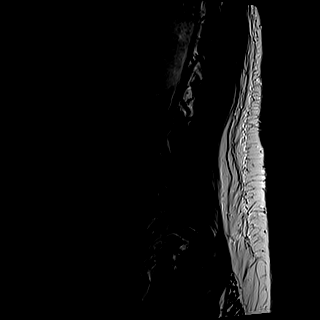

[Series 19: T2 · axial · 4.0mm · 0.62mm/px · z∈[-514,-290]mm · 9 of 43 slices shown]
[im 1/43]
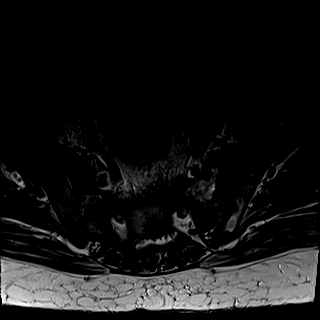
[im 7/43]
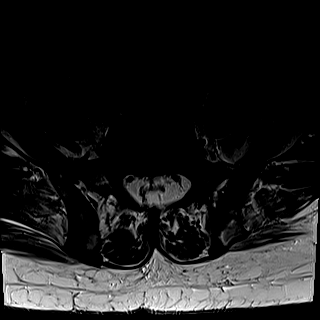
[im 13/43]
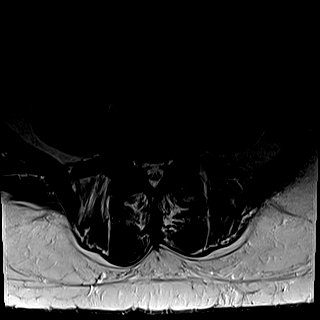
[im 19/43]
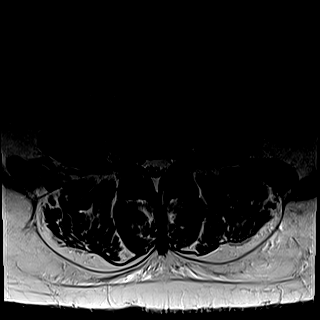
[im 22/43]
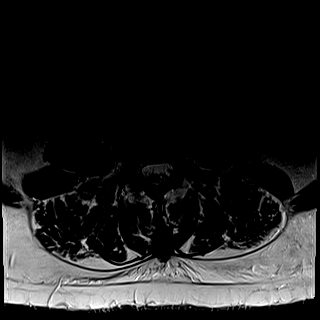
[im 25/43]
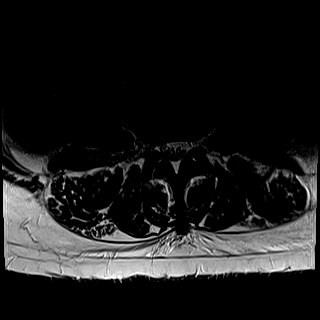
[im 31/43]
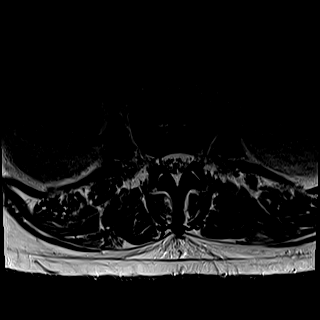
[im 37/43]
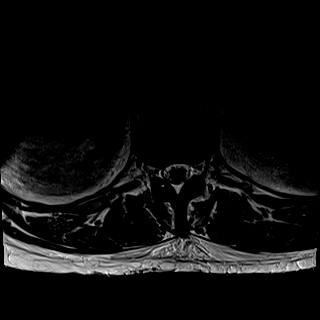
[im 43/43]
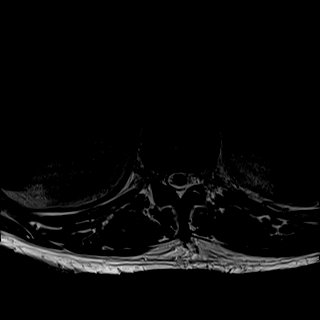

[Series 20: T1 · axial · 4.0mm · 0.39mm/px · z∈[-514,-290]mm · 9 of 43 slices shown (2 of 2)]
[im 1/43]
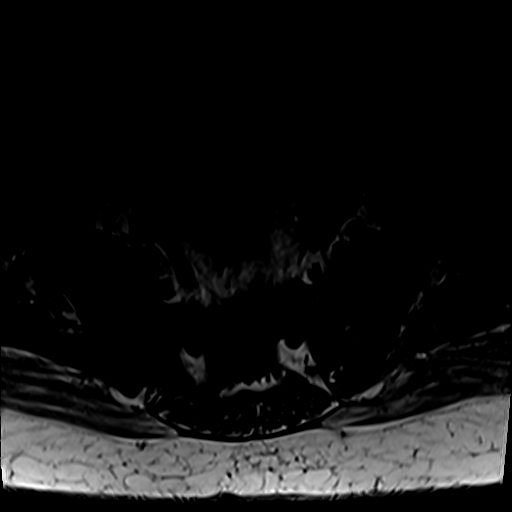
[im 7/43]
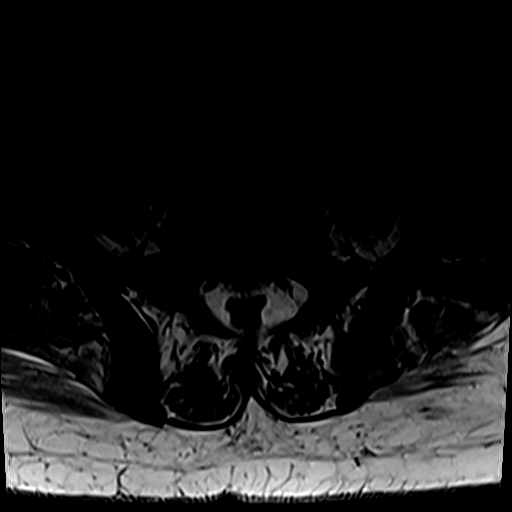
[im 13/43]
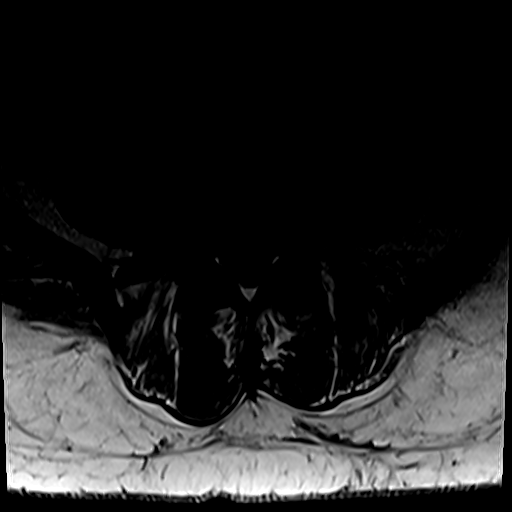
[im 19/43]
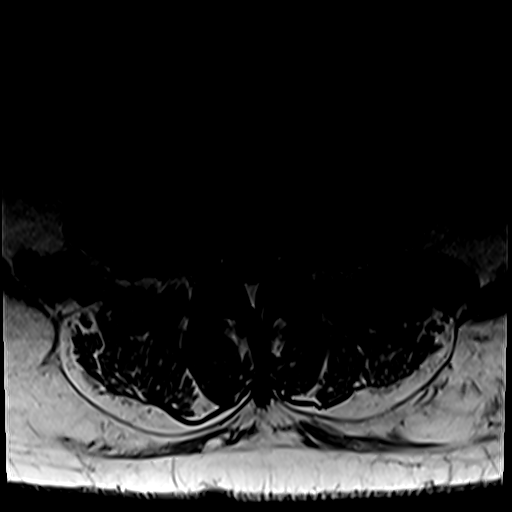
[im 22/43]
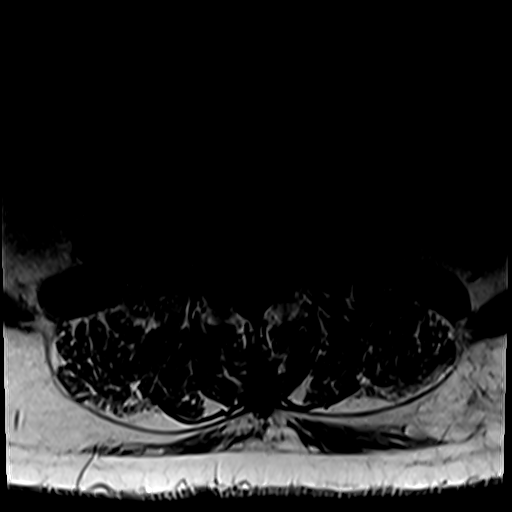
[im 25/43]
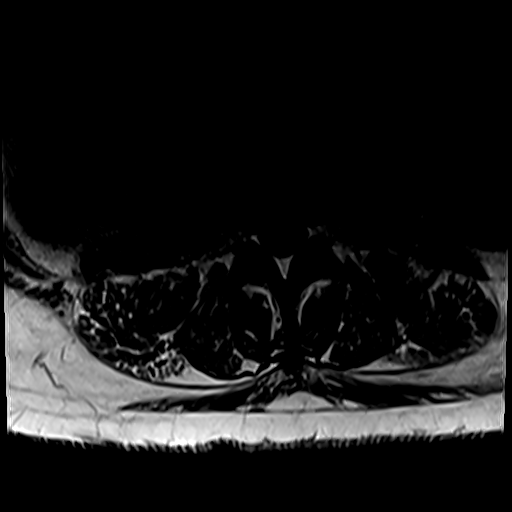
[im 31/43]
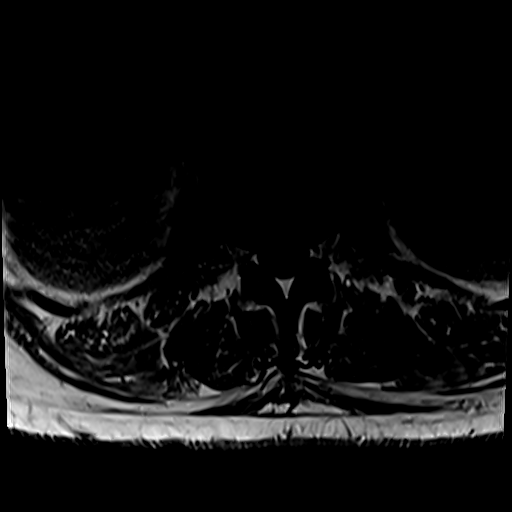
[im 37/43]
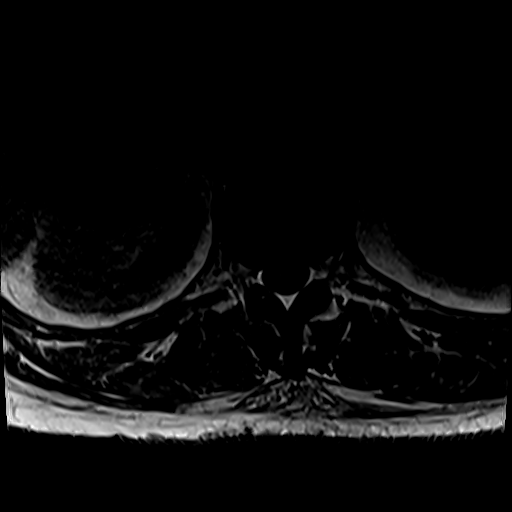
[im 43/43]
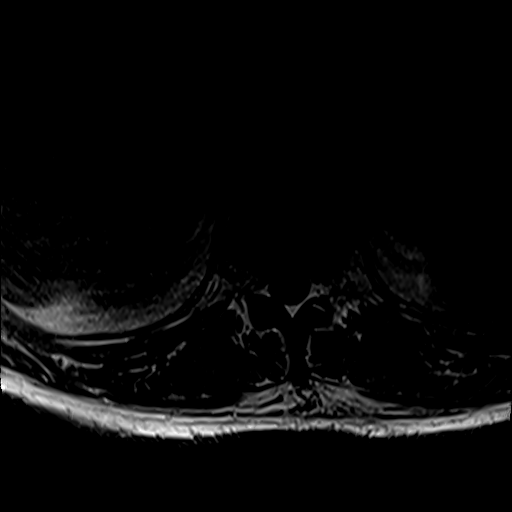

[30 of 48 positions shown; findings below may reference images not displayed]

FINDINGS: The patient terminated the examination prior to completion. A
complete noncontrast study was obtained, however no IV contrast was
administered.

Segmentation: Standard.

Alignment:  Normal.

Vertebrae: Diffusely abnormal, heterogeneous marrow signal
throughout the lumbar spine, included lower thoracic spine, and
pelvis corresponding to known widespread metastases. Mild L1
superior endplate compression fracture with 15% anterior vertebral
body height loss which is new from [DATE] L2 compression
fracture with 25% height loss asymmetric to the right, also new.
Multiple small Schmorl's nodes in the lumbar and lower thoracic
spine. No evidence of epidural tumor on this unenhanced study.

Conus medullaris and cauda equina: Conus extends to the L1-2 level.
Conus and cauda equina appear normal.

Paraspinal and other soft tissues: Partially visualized moderate
bladder distension.

Disc levels:

T12-L1: Negative.

L1-2: Mild left facet hypertrophy without disc herniation or
stenosis.

L2-3: Minimal disc bulging and mild facet hypertrophy without
stenosis.

L3-4: Mild facet hypertrophy without stenosis.

L4-5: Disc bulging and severe left and moderate right facet
hypertrophy result in mild spinal stenosis, mild bilateral lateral
recess stenosis, and mild left neural foraminal stenosis, similar to
the [DATE] CT.

L5-S1: Mild right and moderate left facet hypertrophy without disc
herniation or stenosis.
IMPRESSION: 1. Known widespread osseous metastases. No evidence of epidural
tumor on this unenhanced study.
2. Mild L1 and L2 compression fractures, new from [DATE].
3. Unchanged mild multifactorial spinal stenosis at L4-5.

## 2021-05-20 MED ORDER — LORAZEPAM 2 MG/ML IJ SOLN: 2.0000 mg | Freq: Once | INTRAMUSCULAR | Status: DC | PRN

## 2021-05-20 MED ORDER — CHLORHEXIDINE GLUCONATE CLOTH 2 % EX PADS
6.0000 | MEDICATED_PAD | Freq: Every day | CUTANEOUS | Status: DC
  Administered 2021-05-21 – 2021-05-30 (×10): 6 via TOPICAL

## 2021-05-20 MED ORDER — LORAZEPAM 2 MG/ML IJ SOLN
1.0000 mg | Freq: Once | INTRAMUSCULAR | Status: AC | PRN
  Administered 2021-05-20: 1 mg via INTRAVENOUS
  Filled 2021-05-20: qty 1

## 2021-05-20 MED ORDER — GADOBUTROL 1 MMOL/ML IV SOLN: 10.0000 mL | Freq: Once | INTRAVENOUS | Status: DC | PRN

## 2021-05-20 MED ORDER — DEXAMETHASONE SODIUM PHOSPHATE 4 MG/ML IJ SOLN
4.0000 mg | Freq: Four times a day (QID) | INTRAMUSCULAR | Status: DC
  Administered 2021-05-20 – 2021-05-30 (×40): 4 mg via INTRAVENOUS
  Filled 2021-05-20 (×43): qty 1

## 2021-05-20 MED ORDER — LORAZEPAM 2 MG/ML IJ SOLN
1.0000 mg | Freq: Once | INTRAMUSCULAR | Status: DC | PRN
  Filled 2021-05-20: qty 1

## 2021-05-20 MED ORDER — LORAZEPAM 2 MG/ML IJ SOLN
2.0000 mg | Freq: Once | INTRAMUSCULAR | Status: AC | PRN
  Administered 2021-05-20: 2 mg via INTRAVENOUS
  Filled 2021-05-20: qty 1

## 2021-05-20 NOTE — Progress Notes (Signed)
Patient has been educated for the fourth time by RN about the importance of foley catheter placement. Patient has refused placement of foley at this time. Will continue to monitor.  Layla Maw, RN

## 2021-05-20 NOTE — Consult Note (Signed)
Reason for Consult: Paraplegia Referring Physician: Dr. Allie Dimmer. is an 66 y.o. male.  HPI: The patient is a 66 year old black male with widely metastatic prostate cancer who was admitted on 05/01/2021 with chest pain and back pain.  He has known multiple bony metastasis. Palliative radiation therapy was started.  At some point early today the patient was noted to be paraplegic.  He was given a milligram of Ativan and sent for a lumbar MRI which did not demonstrated any explanations.  He was given 2 more milligrams of Ativan and underwent a thoracic MRI without contrast.  I was directly contacted by Dr. Florene Glen and immediately reviewed his thoracic MRI and came to [facility] to evaluate the patient.  Presently the patient is extremely somnolent, presumably from the Ativan.  I really cannot ask many questions because he is so somnolent, but he does follow commands.   Past Medical History:  Diagnosis Date   Hypertension    Prostate cancer ([place])     History reviewed. No pertinent surgical history.  History reviewed. No pertinent family history.  Social History:  reports that he has been smoking cigarettes. He has a 25.00 pack-year smoking history. He has never used smokeless tobacco. He reports that he does not currently use alcohol. 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  Allergies: No Known Allergies  Medications:  Results for orders placed or performed during the hospital encounter of 05/01/21 (from the past 48 hour(s))  CBC with Differential/Platelet     Status: Abnormal   Collection Time: 05/19/21  5:01 AM  Result Value Ref Range   WBC 7.3 4.0 - 10.5 K/uL   RBC 3.50 (L) 4.22 - 5.81 MIL/uL   Hemoglobin 10.7 (L) 13.0 - 17.0 g/dL   HCT 33.1 (L) 39.0 - 52.0 %   MCV 94.6 80.0 - 100.0 fL   MCH 30.6 26.0 - 34.0 pg   MCHC 32.3 30.0 - 36.0 g/dL   RDW 18.5 (H) 11.5 - 15.5 %   Platelets 164 150 -  400 K/uL   nRBC 3.2 (H) 0.0 - 0.2 %   Neutrophils Relative % 69 %   Neutro Abs 5.0 1.7 - 7.7 K/uL   Lymphocytes Relative 17 %   Lymphs Abs 1.3 0.7 - 4.0 K/uL   Monocytes Relative 9 %   Monocytes Absolute 0.7 0.1 - 1.0 K/uL   Eosinophils Relative 0 %   Eosinophils Absolute 0.0 0.0 - 0.5 K/uL   Basophils Relative 0 %   Basophils Absolute 0.0 0.0 - 0.1 K/uL   Immature Granulocytes 5 %   Abs Immature Granulocytes 0.33 (H) 0.00 - 0.07 K/uL    Comment: Performed at [facility], [place] [address], [place], [place] [address]  Comprehensive metabolic panel     Status: Abnormal   Collection Time: 05/19/21  5:01 AM  Result Value Ref Range   Sodium 139 135 - 145 mmol/L   Potassium 3.8 3.5 - 5.1 mmol/L   Chloride 104 98 - 111 mmol/L   CO2 26 22 - 32 mmol/L   Glucose, Bld 129 (H) 70 - 99 mg/dL    Comment: Glucose reference range applies only to samples taken after fasting for at least 8 hours.   BUN 43 (H) 8 - 23 mg/dL   Creatinine, Ser 1.19 0.61 - 1.24 mg/dL   Calcium 9.2 8.9 - 10.3 mg/dL   Total Protein 6.6 6.5 -  8.1 g/dL   Albumin 3.9 3.5 - 5.0 g/dL   AST 33 15 - 41 U/L   ALT 22 0 - 44 U/L   Alkaline Phosphatase 274 (H) 38 - 126 U/L   Total Bilirubin 0.2 (L) 0.3 - 1.2 mg/dL   GFR, Estimated >60 >60 mL/min    Comment: (NOTE) Calculated using the CKD-EPI Creatinine Equation (2021)    Anion gap 9 5 - 15    Comment: Performed at [facility], [place] [address] [place], [place] [address]  Magnesium     Status: None   Collection Time: 05/19/21  5:01 AM  Result Value Ref Range   Magnesium 2.4 1.7 - 2.4 mg/dL    Comment: Performed at [facility], [place] [address] [place], [place] [address]  Phosphorus     Status: Abnormal   Collection Time: 05/19/21  5:01 AM  Result Value Ref Range   Phosphorus 4.7 (H) 2.5 - 4.6 mg/dL    Comment: Performed at [facility], [place] [address] [place], [place] [address]  CBC with  Differential/Platelet     Status: Abnormal   Collection Time: 05/20/21  4:55 AM  Result Value Ref Range   WBC 7.4 4.0 - 10.5 K/uL   RBC 3.14 (L) 4.22 - 5.81 MIL/uL   Hemoglobin 9.6 (L) 13.0 - 17.0 g/dL   HCT 29.4 (L) 39.0 - 52.0 %   MCV 93.6 80.0 - 100.0 fL   MCH 30.6 26.0 - 34.0 pg   MCHC 32.7 30.0 - 36.0 g/dL   RDW 18.3 (H) 11.5 - 15.5 %   Platelets 143 (L) 150 - 400 K/uL   nRBC 2.3 (H) 0.0 - 0.2 %   Neutrophils Relative % 72 %   Neutro Abs 5.3 1.7 - 7.7 K/uL   Lymphocytes Relative 14 %   Lymphs Abs 1.0 0.7 - 4.0 K/uL   Monocytes Relative 11 %   Monocytes Absolute 0.8 0.1 - 1.0 K/uL   Eosinophils Relative 0 %   Eosinophils Absolute 0.0 0.0 - 0.5 K/uL   Basophils Relative 0 %   Basophils Absolute 0.0 0.0 - 0.1 K/uL   Immature Granulocytes 3 %   Abs Immature Granulocytes 0.23 (H) 0.00 - 0.07 K/uL    Comment: Performed at [facility], [place] [address], [place], [place] [address]  Comprehensive metabolic panel     Status: Abnormal   Collection Time: 05/20/21  4:55 AM  Result Value Ref Range   Sodium 138 135 - 145 mmol/L   Potassium 3.6 3.5 - 5.1 mmol/L   Chloride 104 98 - 111 mmol/L   CO2 27 22 - 32 mmol/L   Glucose, Bld 104 (H) 70 - 99 mg/dL    Comment: Glucose reference range applies only to samples taken after fasting for at least 8 hours.   BUN 46 (H) 8 - 23 mg/dL   Creatinine, Ser 0.82 0.61 - 1.24 mg/dL   Calcium 8.9 8.9 - 10.3 mg/dL   Total Protein 5.4 (L) 6.5 - 8.1 g/dL   Albumin 3.4 (L) 3.5 - 5.0 g/dL   AST 21 15 - 41 U/L   ALT 19 0 - 44 U/L   Alkaline Phosphatase 244 (H) 38 - 126 U/L   Total Bilirubin 0.3 0.3 - 1.2 mg/dL   GFR, Estimated >60 >60 mL/min    Comment: (NOTE) Calculated using the CKD-EPI Creatinine Equation (2021)    Anion gap 7 5 - 15    Comment:  Performed at [facility], [place] [address] [place], [place] [address]  Magnesium     Status: None   Collection Time: 05/20/21  4:55 AM  Result Value Ref Range    Magnesium 2.3 1.7 - 2.4 mg/dL    Comment: Performed at [facility], [place] [address], [place], [place] [address]  Phosphorus     Status: Abnormal   Collection Time: 05/20/21  4:55 AM  Result Value Ref Range   Phosphorus 4.9 (H) 2.5 - 4.6 mg/dL    Comment: Performed at [facility], [place] [address] [place], [place] [address]  Urinalysis, Complete w Microscopic     Status: None   Collection Time: 05/20/21  4:43 PM  Result Value Ref Range   Color, Urine YELLOW YELLOW   APPearance CLEAR CLEAR   Specific Gravity, Urine 1.018 1.005 - 1.030   pH 5.0 5.0 - 8.0   Glucose, UA NEGATIVE NEGATIVE mg/dL   Hgb urine dipstick NEGATIVE NEGATIVE   Bilirubin Urine NEGATIVE NEGATIVE   Ketones, ur NEGATIVE NEGATIVE mg/dL   Protein, ur NEGATIVE NEGATIVE mg/dL   Nitrite NEGATIVE NEGATIVE   Leukocytes,Ua NEGATIVE NEGATIVE   RBC / HPF 0-5 0 - 5 RBC/hpf   WBC, UA 0-5 0 - 5 WBC/hpf   Bacteria, UA NONE SEEN NONE SEEN    Comment: Performed at [facility], [place] [address], [place], [place] [address]    MR THORACIC SPINE WO CONTRAST  Result Date: 05/20/2021 CLINICAL DATA:  Initial evaluation for acute mid back pain, history of metastatic prostate cancer. EXAM: MRI THORACIC SPINE WITHOUT CONTRAST TECHNIQUE: Multiplanar, multisequence MR imaging of the thoracic spine was performed. No intravenous contrast was administered. COMPARISON:  CT from 02/05/2021. FINDINGS: Alignment:  Examination degraded by motion artifact. Mild dextroscoliosis. Alignment otherwise normal with preservation of the normal thoracic kyphosis. No listhesis. Vertebrae: Innumerable osseous metastases seen throughout the thoracic spine, consistent with history of metastatic prostate cancer. There is involvement of essentially all levels, with diffuse involvement of the visualized ribs. Since previous exam, there is progressive height loss at the T2, T3, T4, T5, T8, T9, T10, T11, and T12 vertebral  bodies, consistent with interval pathologic compression fractures. Associated bony retropulsion and/or abnormal convex posterior contour now seen at multiple levels, most pronounced at T2, T3, T4, T6, and T8. Cord: Grossly normal signal and morphology. No appreciable cord signal changes on this motion degraded exam. Paraspinal and other soft tissues: Mild edema seen throughout the upper and mid paraspinous soft tissues, presumably reactive. No collections. Small layering bilateral pleural effusions, left greater than right. Undo that Disc levels: No significant disc pathology seen within the thoracic spine. However, the bony retropulsion and/or posterior convex bowing seen at multiple levels extending from T2 through T9, in combination with prominence of the dorsal epidural fat results in mild to moderate diffuse spinal stenosis, most pronounced at T2 through T4 and T8. Mild flattening of the thoracic cord at several levels, most pronounced at T4 and T8, but no visible cord signal changes. Mild to moderate bilateral bony foraminal narrowing present at T1-2 through T5-6, most pronounced at T4-5 bilaterally. Otherwise, no foramina remain patent. IMPRESSION: 1. Innumerable osseous metastases throughout the thoracic spine, consistent with history of metastatic prostate cancer. Progressive height loss at the T2, T3, T4, T5, T8, T9, T10, T11, and T12 vertebral bodies, consistent with interval pathologic compression fractures. Associated bony retropulsion and/or abnormal convex posterior contour now seen at multiple levels, most pronounced  at T2 through T8. 2. Mild to moderate diffuse spinal stenosis extending from T2 through T9, most pronounced at T2 through T4 and T8. 3. Mild to moderate bilateral bony foraminal narrowing at T1-2 through T5-6, most pronounced at T4-5. 4. Small layering bilateral pleural effusions, left greater than right. Electronically Signed   By: Jeannine Boga M.D.   On: 05/20/2021 19:14   MR  LUMBAR SPINE WO CONTRAST  Result Date: 05/20/2021 CLINICAL DATA:  Low back pain, cauda equina syndrome suspected. Weakness and numbness in the lower extremities. Metastatic prostate cancer. EXAM: MRI LUMBAR SPINE WITHOUT CONTRAST TECHNIQUE: Multiplanar, multisequence MR imaging of the lumbar spine was performed. No intravenous contrast was administered. COMPARISON:  Lumbar spine CT 02/05/2021. CT abdomen and pelvis 04/02/2021. FINDINGS: The patient terminated the examination prior to completion. A complete noncontrast study was obtained, however no IV contrast was administered. Segmentation: Standard. Alignment:  Normal. Vertebrae: Diffusely abnormal, heterogeneous marrow signal throughout the lumbar spine, included lower thoracic spine, and pelvis corresponding to known widespread metastases. Mild L1 superior endplate compression fracture with 15% anterior vertebral body height loss which is new from 04/02/2021 L2 compression fracture with 25% height loss asymmetric to the right, also new. Multiple small Schmorl's nodes in the lumbar and lower thoracic spine. No evidence of epidural tumor on this unenhanced study. Conus medullaris and cauda equina: Conus extends to the L1-2 level. Conus and cauda equina appear normal. Paraspinal and other soft tissues: Partially visualized moderate bladder distension. Disc levels: T12-L1: Negative. L1-2: Mild left facet hypertrophy without disc herniation or stenosis. L2-3: Minimal disc bulging and mild facet hypertrophy without stenosis. L3-4: Mild facet hypertrophy without stenosis. L4-5: Disc bulging and severe left and moderate right facet hypertrophy result in mild spinal stenosis, mild bilateral lateral recess stenosis, and mild left neural foraminal stenosis, similar to the 02/05/2021 CT. L5-S1: Mild right and moderate left facet hypertrophy without disc herniation or stenosis. IMPRESSION: 1. Known widespread osseous metastases. No evidence of epidural tumor on this  unenhanced study. 2. Mild L1 and L2 compression fractures, new from 04/02/2021. 3. Unchanged mild multifactorial spinal stenosis at L4-5. Electronically Signed   By: Logan Bores M.D.   On: 05/20/2021 13:26   DG HIPS BILAT WITH PELVIS 2V  Result Date: 05/20/2021 CLINICAL DATA:  66 year old male with history of trauma from a fall with bilateral hip pain. History of prostate cancer. EXAM: DG HIP (WITH OR WITHOUT PELVIS) 2V BILAT COMPARISON:  No priors. FINDINGS: Five views of the bony pelvis and bilateral hips demonstrate no acute displaced fracture, subluxation or dislocation. There is joint space narrowing, subchondral sclerosis and osteophyte formation associated with both hip joints, indicative of moderate osteoarthritis. Innumerable poorly defined sclerotic areas are noted throughout the visualized portions of the axial and appendicular skeleton, indicative of widespread metastatic disease to the bones. IMPRESSION: 1. No acute radiographic abnormality of the bony pelvis. 2. Widespread metastatic disease to the bones. 3. Moderate bilateral hip joint osteoarthritis. Electronically Signed   By: Vinnie Langton M.D.   On: 05/20/2021 08:07    ROS: Unobtainable Blood pressure (!) 143/68, pulse 88, temperature 97.6 F (36.4 C), temperature source Oral, resp. rate 16, height 5\' 10"  (1.778 m), weight 99.1 kg, SpO2 94 %. Estimated body mass index is 31.35 kg/m as calculated from the following:   Height as of this encounter: 5\' 10"  (1.778 m).   Weight as of this encounter: 99.1 kg.  Physical Exam  General: An obese very somnolent but arousable 66 year old paraplegic black  male in no apparent distress  HEENT: Normocephalic, atraumatic  Neck: Unremarkable  Thorax: Symmetric  Abdomen: Soft  Extremities his upper extremities are unremarkable.  His lower extremities are flaccid.  Neurologic exam: The exam is limited by his somnolence but he has a bit of disconjugate gaze.  Extraocular muscles are  intact.  The patient's motor strength is 5/5 in spinal bicep, tricep and handgrip.  He has 0/5 bilateral iliopsoas, quadricep, gastrocnemius and dorsiflexor strength.  He is too sleepy to get an accurate sensory exam.  I do not elicit any clonus or deep tendon reflexes in his lower extremities.  Imaging studies I reviewed the patient's lumbar MRI performed [facility] today.  He has multiple bony metastasis but I do not see any significant neural compression.  Of also reviewed the patient's thoracic MRI performed that was in the hospital today without contrast.  The resolution of the scan is poor secondary to motion and lack of contrast.  He has bony metastasis and all the visualized vertebrae.  He appears to have significant spinal cord compression from approximately T4-T8.  Assessment/Plan: Paraplegia, widespread prostate bony metastasis, thoracic spinal cord compression: Presently I cannot discuss this with the patient as he is too somnolent to have a meaningful conversation.  Clearly he cannot move his lower extremities at all.  I therefore do not think he has much chance for meaningful recovery even with surgery.  Furthermore with his widely metastatic disease he is not a good surgical candidate.  I would suggest palliative care.  Please call if I can be of further assistance.  Ophelia Charter 05/20/2021, 8:37 PM

## 2021-05-20 NOTE — Assessment & Plan Note (Addendum)
Foley in place  

## 2021-05-20 NOTE — Progress Notes (Signed)
PROGRESS NOTE    Randall Larsen.  KLK:[id number] DOB: 1955/10/15 DOA: 05/01/2021 PCP: [name], MD  Chief Complaint  Patient presents with   Chest Pain    Brief Narrative:  Randall Larsen is Randall Larsen 66 y.o. 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  He was evaluated for urgent radiation therapy and started radiation treatment on 05/04/2021.  Palliative care following and assisting with pain management.      Assessment & Plan:   Principal Problem:   Cancer associated pain Active Problems:   Flaccid diplegia of lower extremities (HCC)   Prostate cancer metastatic to bone [facility])   Edema   Chest pain   Fall   Essential hypertension   Bilateral lower extremity edema   Bone metastases (HCC)   Assessment and Plan: * Cancer associated pain- (present on admission) Pain is difficult to control Appreciate palliative care assistance with adjustment of medications - PCA d/c'd 2/9, had pain throughout the night.  He's on dexamethasone, methadone, lidocaine patch, APAP scheduled, toradol scheduled, gabapentin scheduled, robaxin with premedication for radiation He's not tolerated his recent radiation sessions    Flaccid diplegia of lower extremities (HCC) Developed this morning, though exact timing unclear Most concerning for neurologic complication related to his metastatic cancer MRI L spine and thoracic spine ordered (didn't tolerate all of L spine) L spine with mild multifocatorial spinal stenosis L4-5, mild L1 and L2 compression fractures, widespread osseous metastasis MRI T spine pending Start IV steroids Follow results  Prostate cancer metastatic to bone [facility])- (present on admission) Sees Dr. Lorenso Courier as an outpatient S/p lupron 04/17/2020, continue q 3 months Plan for abiraterone 1000 mg PO daily on 2/6 (will defer to  outpatient) Stop bicalutamide per oncology   Edema Echo with EF 60-65%, no RWMA, normal RVSF Follow UA to eval for proteinuria Continue lasix LE [place] without DVT Net negative, but still impressive edema  Fall Found on ground - he denies falling, says he eased himself down  Chest pain- (present on admission) Suspect related to metastatic disease.  Do not suspect ACS.  Essential hypertension- (present on admission) Blood pressure still elevated Continue metoprolol, hydralazine Lasix   DVT prophylaxis: lovenox Code Status: full Family Communication: ex wife over phone Disposition:   Status is: Inpatient Remains inpatient appropriate because: pending better pain control   Consultants:  Oncology palliative  Procedures:  none  Antimicrobials:  Anti-infectives (From admission, onward)    None       Subjective: Notes he let himselft down last night, did not fall Lower extremity weakness, unclear exactly when this started - sounds like this morning?  Not related to him sliding out of recliner.  Notes decreased sensation/numbness to R thigh on our discussion, but on exam to light touch, he noted it felt normal/equal to both sides and no sensory deficit noted.   Later in day he called ex wife, she's Randall Larsen Presenter, broadcasting and had many questions.  Discussed his course at length and concerns about the next steps.  Objective: Vitals:   05/20/21 0300 05/20/21 0400 05/20/21 0855 05/20/21 1356  BP: (!) 161/74 (!) 143/71 (!) 153/72 139/64  Pulse: 77 81 77 80  Resp: 20 18 20 19   Temp: 98.3 F (36.8 C) 98.2 F (36.8 C) 98 F (36.7 C) 98.4 F (36.9 C)  TempSrc: Oral Oral Oral Oral  SpO2: 100% 98% 100% 98%  Weight:      Height:        Intake/Output Summary (Last 24 hours) at 05/20/2021 1536 Last data filed at 05/20/2021 1000 Gross per 24 hour  Intake 1320 ml  Output 700 ml  Net 620 ml   Filed Weights   05/01/21 2023  Weight: 99.1 kg    Examination:  General:  anxious  Cardiovascular: RRR Lungs: unlabored Abdomen: Soft, nontender, nondistended Neurological: Alert and oriented 3. CN 2-12 intact.  Sensation intact to light touch to bilateral upper extremities.  No sensory deficits to light touch to torso.  Flaccid bilateral lower extremities.  Denies saddle ansesthesia.  Sensation intact bilaterally.  Negative babinski. Skin: Warm and dry. No rashes or lesions. Extremities: bilateral LE edema     Data Reviewed: I have personally reviewed following labs and imaging studies  CBC: Recent Labs  Lab 05/16/21 0505 05/19/21 0501 05/20/21 0455  WBC 7.1 7.3 7.4  NEUTROABS  --  5.0 5.3  HGB 10.0* 10.7* 9.6*  HCT 30.7* 33.1* 29.4*  MCV 92.5 94.6 93.6  PLT 181 164 143*    Basic Metabolic Panel: Recent Labs  Lab 05/16/21 0505 05/19/21 0501 05/20/21 0455  NA 141 139 138  K 4.3 3.8 3.6  CL 105 104 104  CO2 28 26 27   GLUCOSE 102* 129* 104*  BUN 41* 43* 46*  CREATININE 1.07 1.19 0.82  CALCIUM 9.1 9.2 8.9  MG 2.4 2.4 2.3  PHOS 4.4 4.7* 4.9*    GFR: Estimated Creatinine Clearance: 104.5 mL/min (by C-G formula based on SCr of 0.82 mg/dL).  Liver Function Tests: Recent Labs  Lab 05/19/21 0501 05/20/21 0455  AST 33 21  ALT 22 19  ALKPHOS 274* 244*  BILITOT 0.2* 0.3  PROT 6.6 5.4*  ALBUMIN 3.9 3.4*    CBG: No results for input(s): GLUCAP in the last 168 hours.   No results found for this or any previous visit (from the past 240 hour(s)).       Radiology Studies: MR LUMBAR SPINE WO CONTRAST  Result Date: 05/20/2021 CLINICAL DATA:  Low back pain, cauda equina syndrome suspected. Weakness and numbness in the lower extremities. Metastatic prostate cancer. EXAM: MRI LUMBAR SPINE WITHOUT CONTRAST TECHNIQUE: Multiplanar, multisequence MR imaging of the lumbar spine was performed. No intravenous contrast was administered. COMPARISON:  Lumbar spine CT 02/05/2021. CT abdomen and pelvis 04/02/2021. FINDINGS: The patient terminated  the examination prior to completion. Burns Timson complete noncontrast study was obtained, however no IV contrast was administered. Segmentation: Standard. Alignment:  Normal. Vertebrae: Diffusely abnormal, heterogeneous marrow signal throughout the lumbar spine, included lower thoracic spine, and pelvis corresponding to known widespread metastases. Mild L1 superior endplate compression fracture with 15% anterior vertebral body height loss which is new from 04/02/2021 L2 compression fracture with 25% height loss asymmetric to the right, also new. Multiple small Schmorl's nodes in the lumbar and lower thoracic spine. No evidence of epidural tumor on this unenhanced study. Conus medullaris and cauda equina: Conus extends to the L1-2 level. Conus and cauda equina appear normal. Paraspinal and other soft tissues: Partially visualized moderate bladder distension. Disc levels: T12-L1: Negative. L1-2: Mild left facet hypertrophy without disc herniation or stenosis. L2-3: Minimal disc bulging and mild facet hypertrophy without stenosis. L3-4: Mild facet hypertrophy without stenosis. L4-5: Disc bulging and severe left and moderate right facet hypertrophy result in mild spinal stenosis, mild bilateral lateral recess stenosis, and mild left neural foraminal stenosis, similar to the 02/05/2021  CT. L5-S1: Mild right and moderate left facet hypertrophy without disc herniation or stenosis. IMPRESSION: 1. Known widespread osseous metastases. No evidence of epidural tumor on this unenhanced study. 2. Mild L1 and L2 compression fractures, new from 04/02/2021. 3. Unchanged mild multifactorial spinal stenosis at L4-5. Electronically Signed   By: Logan Bores M.D.   On: 05/20/2021 13:26   DG HIPS BILAT WITH PELVIS 2V  Result Date: 05/20/2021 CLINICAL DATA:  66 year old male with history of trauma from [place] fall with bilateral hip pain. History of prostate cancer. EXAM: DG HIP (WITH OR WITHOUT PELVIS) 2V BILAT COMPARISON:  No priors. FINDINGS:  Five views of the bony pelvis and bilateral hips demonstrate no acute displaced fracture, subluxation or dislocation. There is joint space narrowing, subchondral sclerosis and osteophyte formation associated with both hip joints, indicative of moderate osteoarthritis. Innumerable poorly defined sclerotic areas are noted throughout the visualized portions of the axial and appendicular skeleton, indicative of widespread metastatic disease to the bones. IMPRESSION: 1. No acute radiographic abnormality of the bony pelvis. 2. Widespread metastatic disease to the bones. 3. Moderate bilateral hip joint osteoarthritis. Electronically Signed   By: Vinnie Langton M.D.   On: 05/20/2021 08:07        Scheduled Meds:  acetaminophen  1,000 mg Oral TID   celecoxib  200 mg Oral BID   dexamethasone (DECADRON) injection  4 mg Intravenous Q6H   docusate sodium  100 mg Oral Q12H   enoxaparin (LOVENOX) injection  40 mg Subcutaneous Q24H   furosemide  40 mg Intravenous BID   gabapentin  300 mg Oral BID   gabapentin  600 mg Oral QHS   hydrALAZINE  50 mg Oral TID   lidocaine  2 patch Transdermal Q24H   methadone  20 mg Oral Q8H   metoprolol tartrate  25 mg Oral BID   pantoprazole  40 mg Oral BID   polyethylene glycol  17 g Oral BID   potassium chloride  20 mEq Oral Daily   senna-docusate  2 tablet Oral BID   Continuous Infusions:   LOS: 18 days    Time spent: over 30 min    Fayrene Helper, MD [facility]   To contact the attending provider between 7A-7P or the covering provider during after hours 7P-7A, please log into the web site [email or web address] and access using universal [place] password for that web site. If you do not have the password, please call the hospital operator.  05/20/2021, 3:36 PM

## 2021-05-20 NOTE — Assessment & Plan Note (Addendum)
Found on ground on 2/10-11 PM - He denies falling, states he eased himself down This was before his weakness on 2/11 am, related.

## 2021-05-20 NOTE — Progress Notes (Signed)
°   05/20/21 1255  Urine Characteristics  Urinary Interventions Bladder scan  Bladder Scan Volume (mL) 918 mL  Intermittent/Straight Cath (mL)  (Foley catheter placement ordered, Patient refused.)   Patient refused placement of foley. Patient has been educated by RN on the importance of foley placement and the risks associated with refusal of foley insertion. MD made aware and MD came to bedside and educated patient on risks associated with refusal of foley insertion. Patient not currently agreeable. Will continue to monitor.  Layla Maw, RN

## 2021-05-20 NOTE — Assessment & Plan Note (Addendum)
Developed flaccid paralysis of the extremities on 2/11 AM Significant cord compression noted from T4-T8. MRI L spine with mild multifocatorial spinal stenosis L4-5, mild L1 and L2 compression fractures, widespread osseous metastasis. MRI T spine innumerable osseous metastases throughout thoracic spine c/w hx metastatic prostate cancer, progressive height loss at T2,T3, T4, T5, T8, T9, T10, T11, T12 vertebral bodies, bony retropulsion and/or abnormal convex posterior contour seen at multiple levels, most pronounced T2-T8. Continued IV steroids Neurosurgery did not think he had much chance for meaningful recovery even with surgery. Radiation oncology to continue therapy, regimen  modified. Appreciate oncology assistance Appreciate palliative care recs.

## 2021-05-20 NOTE — Progress Notes (Signed)
The pt slide from the recliner to the fall and landed on both knees.This event was unwitnessed.  Sates that he did not hit his head. Charge, RRT and the pt's primary nurse at the bedside. Vital signs stable, pt alert x4,  no signs of injury. NP Blount  made aware. Order placed for 2 view xray. I will continue to monitor.

## 2021-05-21 DIAGNOSIS — G893 Neoplasm related pain (acute) (chronic): Secondary | ICD-10-CM | POA: Diagnosis not present

## 2021-05-21 LAB — CBC
HCT: 30.7 % — ABNORMAL LOW (ref 39.0–52.0)
Hemoglobin: 10.3 g/dL — ABNORMAL LOW (ref 13.0–17.0)
MCH: 30.8 pg (ref 26.0–34.0)
MCHC: 33.6 g/dL (ref 30.0–36.0)
MCV: 91.9 fL (ref 80.0–100.0)
Platelets: 165 10*3/uL (ref 150–400)
RBC: 3.34 MIL/uL — ABNORMAL LOW (ref 4.22–5.81)
RDW: 18.4 % — ABNORMAL HIGH (ref 11.5–15.5)
WBC: 7.7 10*3/uL (ref 4.0–10.5)
nRBC: 1 % — ABNORMAL HIGH (ref 0.0–0.2)

## 2021-05-21 LAB — COMPREHENSIVE METABOLIC PANEL
ALT: 19 U/L (ref 0–44)
AST: 40 U/L (ref 15–41)
Albumin: 3.6 g/dL (ref 3.5–5.0)
Alkaline Phosphatase: 293 U/L — ABNORMAL HIGH (ref 38–126)
Anion gap: 8 (ref 5–15)
BUN: 47 mg/dL — ABNORMAL HIGH (ref 8–23)
CO2: 27 mmol/L (ref 22–32)
Calcium: 8.8 mg/dL — ABNORMAL LOW (ref 8.9–10.3)
Chloride: 103 mmol/L (ref 98–111)
Creatinine, Ser: 1.13 mg/dL (ref 0.61–1.24)
GFR, Estimated: >60 mL/min (ref >60)
Glucose, Bld: 151 mg/dL — ABNORMAL HIGH (ref 70–99)
Potassium: 4.2 mmol/L (ref 3.5–5.1)
Sodium: 138 mmol/L (ref 135–145)
Total Bilirubin: 0.4 mg/dL (ref 0.3–1.2)
Total Protein: 6.3 g/dL — ABNORMAL LOW (ref 6.5–8.1)

## 2021-05-21 MED ORDER — LORAZEPAM 2 MG/ML IJ SOLN: 2.0000 mg | Freq: Every day | INTRAMUSCULAR | Status: DC | PRN

## 2021-05-21 MED ORDER — HYDROMORPHONE HCL 1 MG/ML IJ SOLN
1.0000 mg | Freq: Every day | INTRAMUSCULAR | Status: DC | PRN
  Administered 2021-05-23 – 2021-05-24 (×2): 2 mg via INTRAVENOUS
  Filled 2021-05-21 (×4): qty 2

## 2021-05-21 NOTE — Progress Notes (Signed)
PROGRESS NOTE    Randall Larsen.  LZJ:[id number] DOB: [birth year] DOA: 05/01/2021 PCP: [name], MD  Chief Complaint  Patient presents with   Chest Pain    Brief Narrative:  Randall Larsen is Randall Larsen 66 y.o. 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  He was evaluated for urgent radiation therapy and started radiation treatment on 05/04/2021.  Palliative care following and assisting with pain management.      Assessment & Plan:   Principal Problem:   Cancer associated pain Active Problems:   Flaccid diplegia of lower extremities (HCC)   Prostate cancer metastatic to bone [facility])   Edema   Acute urinary retention   Chest pain   Fall   Essential hypertension   Bilateral lower extremity edema   Bone metastases (HCC)   Assessment and Plan: * Cancer associated pain- (present on admission) Pain is difficult to control Appreciate palliative care assistance with adjustment of medications - PCA d/c'd 2/9, had pain throughout the night.  He's on dexamethasone, methadone, lidocaine patch, APAP scheduled, toradol scheduled, gabapentin scheduled, robaxin with premedication for radiation He's not tolerated his recent radiation sessions - has dilaudid for premedication ordered   Flaccid diplegia of lower extremities (HCC) Developed 2/11 AM Significant cord compression from T4-T8 MRI L spine and thoracic spine ordered (didn't tolerate all of L spine) L spine with mild multifocatorial spinal stenosis L4-5, mild L1 and L2 compression fractures, widespread osseous metastasis MRI T spine innumerable osseous metastases throughout thoracic spine c/w hx metastatic prostate cancer, progressive height loss atT2,T3, T4, T5, T8, T9, T10, T11, T12 vertebral bodies, bony retropulsion and/or abnormal convex posterior contour seen at multiple levels,  most pronounced T2-T8.  Mild to moderate diffuse spinal stenosis from T2-T9, most pronounced T2-T4 and T8.  Mild to moderate bilateral bony foraminal narrowing at T1-2, through T5-6, most pronounced at T4-5.  Small layering effusions, L>R. IV steroids Neurosurgery did not think he had much change for meaningful recovery even with surgery Will discuss with rad onc, oncology, palliative   Prostate cancer metastatic to bone [facility])- (present on admission) Sees Dr. Lorenso Courier as an outpatient S/p lupron 04/17/2020, continue q 3 months Plan for abiraterone 1000 mg PO daily on 2/6 (will defer to outpatient) Stop bicalutamide per oncology   Acute urinary retention Foley in place  Edema Echo with EF 60-65%, no RWMA, normal RVSF UA without proteinuria Continue lasix LE [place] without DVT Net negative, but still impressive edema  Fall Found on ground 2/10-11 PM - he denies falling, says he eased himself down  Chest pain- (present on admission) Suspect related to metastatic disease.  Do not suspect ACS.  Essential hypertension- (present on admission) Blood pressure still elevated Continue metoprolol, hydralazine Lasix   DVT prophylaxis: lovenox Code Status: full Family Communication: ex wife over phone Disposition:   Status is: Inpatient Remains inpatient appropriate because: pending better pain control   Consultants:  Oncology palliative  Procedures:  none  Antimicrobials:  Anti-infectives (From admission, onward)    None       Subjective: No new complaints Discussed neurosurgery's evaluation and assessment Called ex wife as well with his permission  Objective: Vitals:   05/20/21 2034 05/21/21 0338 05/21/21 0920 05/21/21 1530  BP: (!) 143/68 (!) 149/84 (!) 166/74 (!) 144/69  Pulse: 88 88 (!) 101 83  Resp: 16 18  20  Temp: 97.6 F (36.4 C) 98.2 F (36.8 C) 98.7 F (37.1 C) 98 F (36.7 C)  TempSrc: Oral Oral Oral Oral  SpO2: 94% 96% 100% 98%  Weight:      Height:         Intake/Output Summary (Last 24 hours) at 05/21/2021 1554 Last data filed at 05/21/2021 1127 Gross per 24 hour  Intake --  Output 4325 ml  Net -4325 ml   Filed Weights   05/01/21 2023  Weight: 99.1 kg    Examination:  General: No acute distress. Cardiovascular: unlabored Lungs:RRR Abdomen: Soft, nontender, nondistended Neurological: flaccid paralysis to LE's Skin: Warm and dry. No rashes or lesions. Extremities: bilateral LE edema  Data Reviewed: I have personally reviewed following labs and imaging studies  CBC: Recent Labs  Lab 05/16/21 0505 05/19/21 0501 05/20/21 0455 05/21/21 0826  WBC 7.1 7.3 7.4 7.7  NEUTROABS  --  5.0 5.3  --   HGB 10.0* 10.7* 9.6* 10.3*  HCT 30.7* 33.1* 29.4* 30.7*  MCV 92.5 94.6 93.6 91.9  PLT 181 164 143* 202    Basic Metabolic Panel: Recent Labs  Lab 05/16/21 0505 05/19/21 0501 05/20/21 0455 05/21/21 0826  NA 141 139 138 138  K 4.3 3.8 3.6 4.2  CL 105 104 104 103  CO2 28 26 27 27   GLUCOSE 102* 129* 104* 151*  BUN 41* 43* 46* 47*  CREATININE 1.07 1.19 0.82 1.13  CALCIUM 9.1 9.2 8.9 8.8*  MG 2.4 2.4 2.3  --   PHOS 4.4 4.7* 4.9*  --     GFR: Estimated Creatinine Clearance: 75.9 mL/min (by C-G formula based on SCr of 1.13 mg/dL).  Liver Function Tests: Recent Labs  Lab 05/19/21 0501 05/20/21 0455 05/21/21 0826  AST 33 21 40  ALT 22 19 19   ALKPHOS 274* 244* 293*  BILITOT 0.2* 0.3 0.4  PROT 6.6 5.4* 6.3*  ALBUMIN 3.9 3.4* 3.6    CBG: No results for input(s): GLUCAP in the last 168 hours.   No results found for this or any previous visit (from the past 240 hour(s)).       Radiology Studies: MR THORACIC SPINE WO CONTRAST  Result Date: 05/20/2021 CLINICAL DATA:  Initial evaluation for acute mid back pain, history of metastatic prostate cancer. EXAM: MRI THORACIC SPINE WITHOUT CONTRAST TECHNIQUE: Multiplanar, multisequence MR imaging of the thoracic spine was performed. No intravenous contrast was  administered. COMPARISON:  CT from 02/05/2021. FINDINGS: Alignment:  Examination degraded by motion artifact. Mild dextroscoliosis. Alignment otherwise normal with preservation of the normal thoracic kyphosis. No listhesis. Vertebrae: Innumerable osseous metastases seen throughout the thoracic spine, consistent with history of metastatic prostate cancer. There is involvement of essentially all levels, with diffuse involvement of the visualized ribs. Since previous exam, there is progressive height loss at the T2, T3, T4, T5, T8, T9, T10, T11, and T12 vertebral bodies, consistent with interval pathologic compression fractures. Associated bony retropulsion and/or abnormal convex posterior contour now seen at multiple levels, most pronounced at T2, T3, T4, T6, and T8. Cord: Grossly normal signal and morphology. No appreciable cord signal changes on this motion degraded exam. Paraspinal and other soft tissues: Mild edema seen throughout the upper and mid paraspinous soft tissues, presumably reactive. No collections. Small layering bilateral pleural effusions, left greater than right. Undo that Disc levels: No significant disc pathology seen within the thoracic spine. However, the bony retropulsion and/or posterior convex bowing seen at multiple levels extending from T2 through T9, in combination  with prominence of the dorsal epidural fat results in mild to moderate diffuse spinal stenosis, most pronounced at T2 through T4 and T8. Mild flattening of the thoracic cord at several levels, most pronounced at T4 and T8, but no visible cord signal changes. Mild to moderate bilateral bony foraminal narrowing present at T1-2 through T5-6, most pronounced at T4-5 bilaterally. Otherwise, no foramina remain patent. IMPRESSION: 1. Innumerable osseous metastases throughout the thoracic spine, consistent with history of metastatic prostate cancer. Progressive height loss at the T2, T3, T4, T5, T8, T9, T10, T11, and T12 vertebral  bodies, consistent with interval pathologic compression fractures. Associated bony retropulsion and/or abnormal convex posterior contour now seen at multiple levels, most pronounced at T2 through T8. 2. Mild to moderate diffuse spinal stenosis extending from T2 through T9, most pronounced at T2 through T4 and T8. 3. Mild to moderate bilateral bony foraminal narrowing at T1-2 through T5-6, most pronounced at T4-5. 4. Small layering bilateral pleural effusions, left greater than right. Electronically Signed   By: Jeannine Boga M.D.   On: 05/20/2021 19:14   MR LUMBAR SPINE WO CONTRAST  Result Date: 05/20/2021 CLINICAL DATA:  Low back pain, cauda equina syndrome suspected. Weakness and numbness in the lower extremities. Metastatic prostate cancer. EXAM: MRI LUMBAR SPINE WITHOUT CONTRAST TECHNIQUE: Multiplanar, multisequence MR imaging of the lumbar spine was performed. No intravenous contrast was administered. COMPARISON:  Lumbar spine CT 02/05/2021. CT abdomen and pelvis 04/02/2021. FINDINGS: The patient terminated the examination prior to completion. Susie Pousson complete noncontrast study was obtained, however no IV contrast was administered. Segmentation: Standard. Alignment:  Normal. Vertebrae: Diffusely abnormal, heterogeneous marrow signal throughout the lumbar spine, included lower thoracic spine, and pelvis corresponding to known widespread metastases. Mild L1 superior endplate compression fracture with 15% anterior vertebral body height loss which is new from 04/02/2021 L2 compression fracture with 25% height loss asymmetric to the right, also new. Multiple small Schmorl's nodes in the lumbar and lower thoracic spine. No evidence of epidural tumor on this unenhanced study. Conus medullaris and cauda equina: Conus extends to the L1-2 level. Conus and cauda equina appear normal. Paraspinal and other soft tissues: Partially visualized moderate bladder distension. Disc levels: T12-L1: Negative. L1-2: Mild left  facet hypertrophy without disc herniation or stenosis. L2-3: Minimal disc bulging and mild facet hypertrophy without stenosis. L3-4: Mild facet hypertrophy without stenosis. L4-5: Disc bulging and severe left and moderate right facet hypertrophy result in mild spinal stenosis, mild bilateral lateral recess stenosis, and mild left neural foraminal stenosis, similar to the 02/05/2021 CT. L5-S1: Mild right and moderate left facet hypertrophy without disc herniation or stenosis. IMPRESSION: 1. Known widespread osseous metastases. No evidence of epidural tumor on this unenhanced study. 2. Mild L1 and L2 compression fractures, new from 04/02/2021. 3. Unchanged mild multifactorial spinal stenosis at L4-5. Electronically Signed   By: Logan Bores M.D.   On: 05/20/2021 13:26   DG HIPS BILAT WITH PELVIS 2V  Result Date: 05/20/2021 CLINICAL DATA:  66 year old male with history of trauma from [place] fall with bilateral hip pain. History of prostate cancer. EXAM: DG HIP (WITH OR WITHOUT PELVIS) 2V BILAT COMPARISON:  No priors. FINDINGS: Five views of the bony pelvis and bilateral hips demonstrate no acute displaced fracture, subluxation or dislocation. There is joint space narrowing, subchondral sclerosis and osteophyte formation associated with both hip joints, indicative of moderate osteoarthritis. Innumerable poorly defined sclerotic areas are noted throughout the visualized portions of the axial and appendicular skeleton, indicative of widespread metastatic  disease to the bones. IMPRESSION: 1. No acute radiographic abnormality of the bony pelvis. 2. Widespread metastatic disease to the bones. 3. Moderate bilateral hip joint osteoarthritis. Electronically Signed   By: Vinnie Langton M.D.   On: 05/20/2021 08:07        Scheduled Meds:  acetaminophen  1,000 mg Oral TID   celecoxib  200 mg Oral BID   Chlorhexidine Gluconate Cloth  6 each Topical Daily   dexamethasone (DECADRON) injection  4 mg Intravenous Q6H    docusate sodium  100 mg Oral Q12H   enoxaparin (LOVENOX) injection  40 mg Subcutaneous Q24H   furosemide  40 mg Intravenous BID   gabapentin  300 mg Oral BID   gabapentin  600 mg Oral QHS   hydrALAZINE  50 mg Oral TID   lidocaine  2 patch Transdermal Q24H   methadone  20 mg Oral Q8H   metoprolol tartrate  25 mg Oral BID   pantoprazole  40 mg Oral BID   polyethylene glycol  17 g Oral BID   potassium chloride  20 mEq Oral Daily   senna-docusate  2 tablet Oral BID   Continuous Infusions:   LOS: 19 days    Time spent: over 30 min    Randall Helper, MD [facility]   To contact the attending provider between 7A-7P or the covering provider during after hours 7P-7A, please log into the web site [email or web address] and access using universal [place] password for that web site. If you do not have the password, please call the hospital operator.  05/21/2021, 3:54 PM

## 2021-05-21 NOTE — Progress Notes (Signed)
Pt lethargic throughout shift, received 2mg  IV ativan prior to MRI @ 1741. Will respond to verbal stimuli, but falls back asleep while conversing. Unable to give 2200 meds due to lethargy. On call notified. Pt resting in bed, NAD. Call light within reach.

## 2021-05-22 ENCOUNTER — Encounter: Payer: Self-pay | Admitting: Radiation Oncology

## 2021-05-22 ENCOUNTER — Ambulatory Visit: Payer: Medicare HMO

## 2021-05-22 ENCOUNTER — Ambulatory Visit
Admit: 2021-05-22 | Discharge: 2021-05-22 | Disposition: A | Discharge status: Home / Self Care | Payer: Medicare HMO | Attending: Radiation Oncology | Admitting: Radiation Oncology

## 2021-05-22 DIAGNOSIS — C7951 Secondary malignant neoplasm of bone: Secondary | ICD-10-CM | POA: Diagnosis not present

## 2021-05-22 DIAGNOSIS — R079 Chest pain, unspecified: Secondary | ICD-10-CM | POA: Diagnosis not present

## 2021-05-22 DIAGNOSIS — G893 Neoplasm related pain (acute) (chronic): Secondary | ICD-10-CM | POA: Diagnosis not present

## 2021-05-22 DIAGNOSIS — R52 Pain, unspecified: Secondary | ICD-10-CM | POA: Diagnosis not present

## 2021-05-22 LAB — CBC WITH DIFFERENTIAL/PLATELET
Abs Immature Granulocytes: 0.16 10*3/uL — ABNORMAL HIGH (ref 0.00–0.07)
Basophils Absolute: 0 10*3/uL (ref 0.0–0.1)
Basophils Relative: 0 %
Eosinophils Absolute: 0 10*3/uL (ref 0.0–0.5)
Eosinophils Relative: 0 %
HCT: 28.2 % — ABNORMAL LOW (ref 39.0–52.0)
Hemoglobin: 9.2 g/dL — ABNORMAL LOW (ref 13.0–17.0)
Immature Granulocytes: 2 %
Lymphocytes Relative: 12 %
Lymphs Abs: 0.8 10*3/uL (ref 0.7–4.0)
MCH: 30.5 pg (ref 26.0–34.0)
MCHC: 32.6 g/dL (ref 30.0–36.0)
MCV: 93.4 fL (ref 80.0–100.0)
Monocytes Absolute: 0.7 10*3/uL (ref 0.1–1.0)
Monocytes Relative: 10 %
Neutro Abs: 5 10*3/uL (ref 1.7–7.7)
Neutrophils Relative %: 76 %
Platelets: 129 10*3/uL — ABNORMAL LOW (ref 150–400)
RBC: 3.02 MIL/uL — ABNORMAL LOW (ref 4.22–5.81)
RDW: 18.7 % — ABNORMAL HIGH (ref 11.5–15.5)
WBC: 6.6 10*3/uL (ref 4.0–10.5)
nRBC: 1.2 % — ABNORMAL HIGH (ref 0.0–0.2)

## 2021-05-22 LAB — COMPREHENSIVE METABOLIC PANEL
ALT: 16 U/L (ref 0–44)
AST: 30 U/L (ref 15–41)
Albumin: 3.2 g/dL — ABNORMAL LOW (ref 3.5–5.0)
Alkaline Phosphatase: 286 U/L — ABNORMAL HIGH (ref 38–126)
Anion gap: 7 (ref 5–15)
BUN: 50 mg/dL — ABNORMAL HIGH (ref 8–23)
CO2: 27 mmol/L (ref 22–32)
Calcium: 8.7 mg/dL — ABNORMAL LOW (ref 8.9–10.3)
Chloride: 104 mmol/L (ref 98–111)
Creatinine, Ser: 1.34 mg/dL — ABNORMAL HIGH (ref 0.61–1.24)
GFR, Estimated: 58 mL/min — ABNORMAL LOW (ref >60)
Glucose, Bld: 125 mg/dL — ABNORMAL HIGH (ref 70–99)
Potassium: 4.5 mmol/L (ref 3.5–5.1)
Sodium: 138 mmol/L (ref 135–145)
Total Bilirubin: 0.2 mg/dL — ABNORMAL LOW (ref 0.3–1.2)
Total Protein: 5.7 g/dL — ABNORMAL LOW (ref 6.5–8.1)

## 2021-05-22 LAB — PHOSPHORUS: Phosphorus: 3.9 mg/dL (ref 2.5–4.6)

## 2021-05-22 LAB — MAGNESIUM: Magnesium: 2.3 mg/dL (ref 1.7–2.4)

## 2021-05-22 MED ORDER — FUROSEMIDE 10 MG/ML IJ SOLN
40.0000 mg | Freq: Every day | INTRAMUSCULAR | Status: DC
  Administered 2021-05-23 – 2021-05-30 (×8): 40 mg via INTRAVENOUS
  Filled 2021-05-22 (×8): qty 4

## 2021-05-22 MED ORDER — LACTATED RINGERS IV SOLN: INTRAVENOUS | Status: DC

## 2021-05-22 NOTE — Progress Notes (Signed)
°  Radiation Oncology         [phone]) [phone] ________________________________  Name: Randall Larsen. MRN: [id number]  Date: 05/22/2021  DOB: [birth year]  INPATIENT  VIRTUAL COMPLEX SIMULATION NOTE  NARRATIVE: This patient is currently receiving palliative radiation to multiple bone metastases and hospitalized.  It was discovered on 211, that he had flaccid paralysis of the lower extremities and MRIs of the thoracic and lumbar spine were performed.  Patient does have known involvement of most of his vertebral bodies.  However, at the levels of T4 and T8, there is epidural extension of tumor from the vertebral body placing contact against the spinal cord without spinal cord edema.  He is felt to have clinical spinal cord compression with some radiographic confirmation.  Accordingly, the patient underwent simulation today for ongoing radiation therapy.  The existing CT study set was employed for the purpose of virtual treatment planning.  The target and avoidance structures were reviewed and in some cases modified.  Treatment planning then occurred.  The radiation prescription was entered and confirmed.  A total of tw complex treatment devices were fabricated in the form of multi-leaf collimators to shape radiation around the targets while maximally excluding nearby normal structures. I have requested : 3D Simulation  I have requested a DVH of the following structures: left lung, right lung, heart, and spinal cord in addition to the targets.   PLAN:  This modified radiation beam arrangement is intended to continue the current radiation dose to an additional 20 Gy in 5 fractions for a total cumulative dose of 4 Gy.  ------------------------------------------------  Sheral Apley. Tammi Klippel, M.D.

## 2021-05-22 NOTE — Progress Notes (Signed)
Hematology/Oncology Progress Note  Clinical Summary: Mr. Gumecindo Hopkin is a 66 year old male with medical history significant for metastatic adenocarcinoma of the prostate who presents for intractable pain.  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  Evidence of cord compression due to prostate cancer.  Evaluated by neurosurgery and found not to be a candidate.  Radiation oncology is aware and attempting to address this via radiation therapy --Patient continues to have severe pain but fortunately PCA has been weaned off --Drop in hemoglobin to 9.2, platelets of 129. --Patient remains in good spirits  O:  Vitals:   05/22/21 2124 05/22/21 2131  BP: (!) 173/84 (!) 173/84  Pulse:  77  Resp:  20  Temp:    SpO2:  94%   CMP Latest Ref Rng & Units 05/22/2021 05/21/2021 05/20/2021  Glucose 70 - 99 mg/dL 125(H) 151(H) 104(H)  BUN 8 - 23 mg/dL 50(H) 47(H) 46(H)  Creatinine 0.61 - 1.24 mg/dL 1.34(H) 1.13 0.82  Sodium 135 - 145 mmol/L 138 138 138  Potassium 3.5 - 5.1 mmol/L 4.5 4.2 3.6  Chloride 98 - 111 mmol/L 104 103 104  CO2 22 - 32 mmol/L 27 27 27   Calcium 8.9 - 10.3 mg/dL 8.7(L) 8.8(L) 8.9  Total Protein 6.5 - 8.1 g/dL 5.7(L) 6.3(L) 5.4(L)  Total Bilirubin 0.3 - 1.2 mg/dL 0.2(L) 0.4 0.3  Alkaline Phos 38 - 126 U/L 286(H) 293(H) 244(H)  AST 15 - 41 U/L 30 40 21  ALT 0 - 44 U/L 16 19 19    CBC Latest Ref Rng & Units 05/22/2021 05/21/2021 05/20/2021  WBC 4.0 - 10.5 K/uL 6.6 7.7 7.4  Hemoglobin 13.0 - 17.0 g/dL 9.2(L) 10.3(L) 9.6(L)  Hematocrit 39.0 - 52.0 % 28.2(L) 30.7(L) 29.4(L)  Platelets 150 - 400 K/uL 129(L) 165 143(L)      GENERAL: well appearing elderly African American male in NAD  SKIN: skin color, texture, turgor are normal, no rashes or significant lesions EYES: conjunctiva are pink and non-injected, sclera clear LUNGS: clear to auscultation and percussion with normal breathing effort HEART: regular rate & rhythm and no murmurs and no  lower extremity edema Musculoskeletal: no cyanosis of digits and no clubbing  PSYCH: alert & oriented x 3, fluent speech NEURO: Bilateral lower extremity flaccid paralysis  Assessment/Plan: Mr. Zorian Gunderman is a 66 year old male with medical history significant for metastatic adenocarcinoma of the prostate who presents for intractable pain.  # Cord Compression -- Patient has bilateral lower extremity flaccid paralysis.  Evaluated by neurosurgery and found not to be a candidate for surgical intervention --MRI spine shows mild to moderate diffuse spinal stenosis extending from T2-T9.  Most pronounced at T4-T5 --Radiation oncology is aware and will be pursuing localized radiation therapy  # Goals of Care  --Begin goals of care discussion today with the patient regarding his poor prognosis and likely low quality of life moving forward.  He notes that he would like to pursue all available treatments at this time.  # Pain Control -- Appreciate the assistance of radiation oncology and palliative care in the management of this patient's oncological pain --PCA discontinued --Agree with pain medication recommendations per palliative care.  Defer to them for titration and management of this medication --Oncology will continue to follow  # Metastatic Prostate Cancer # Metastatic Spread to Bones --received lupron 22.5mg  injection on 04/17/2020, continue q 3 months --bicalutamide can be d/c at this time.  --plan to start abiraterone 1000mg  PO daily in outpatient setting   Jenny Reichmann  Lauretta Grill, MD Department of Hematology/Oncology [place] at [facility] Phone: [phone] Pager: [phone] Email: [email or web address]

## 2021-05-22 NOTE — Progress Notes (Signed)
Palliative Medicine Inpatient Follow Up Note      Randall Larsen is sitting upright in bed eating a snack. No acute distress noted. Appears comfortable. On chart review patient developed flaccid diplegia of bilateral lower extremities on 2/11. MRI of spine showed innumerable bone mets and L1-L2 compression fractures.   He is tearful expressing difficulty moving his lower extremities. He is able to feel touch sensations. Reports legs feel heavy. Significant bilateral lower extremity edema.   I empathetically approached discussion regarding cancer progression and poor long-term prognosis. His ex-wife, Randall Larsen has been updated with his condition. Randall Larsen verbalized understanding and is tearful expressing he is not ready to "give up". He is clear in expressed wishes to continue any and all treatment available to allow him an opportunity to live.   He shares tearfully that he is feeling guilt of previous relationships with his ex-wife and children who all reside up [place]. He does not feel he is being punished but does have regrets how he has lived his life. Emotional support provided and encouraged patient to speak with his family and have discussions. Reassuring him of the love they most likely have for him and even if it means expressing his feelings and offering apologies where he feel is needed to move forward in what time he has left. He verbalized understanding and appreciation.   We discussed at length advanced directives and his full code status in consideration of his current illness and co-morbidities. He does not have documents. Request to remain a full code at this time and would like sometime to think about it. He is requesting to complete advance directive and engage in further discussions tomorrow.   His pain appears to be controlled at this time on current regimen. He had episode over the weekend after receiving ativan causing somnolence.   Objective Assessment: Vital Signs Vitals:    05/22/21 0456 05/22/21 1032  BP: (!) 158/79 (!) 146/70  Pulse: 78 72  Resp: 16 20  Temp: 98.1 F (36.7 C) 98.7 F (37.1 C)  SpO2: 98% 98%    Intake/Output Summary (Last 24 hours) at 05/22/2021 1501 Last data filed at 05/22/2021 0026 Gross per 24 hour  Intake 840 ml  Output 1880 ml  Net -1040 ml    Last Weight  Most recent update: 05/22/2021  5:45 AM    Weight  100.1 kg (220 lb 10.9 oz)            Gen:  NAD, sitting upright in bed CV: Normal breathing pattern  PULM: clear to auscultation bilaterally.  ABD: soft/nontender/nondistended/normal bowel sounds EXT: bilateral lower extremity edema left >right, scrotal edema Neuro: Alert and oriented x3, tearful.   SUMMARY OF RECOMMENDATIONS   Continue with current plan of care per medical team  Potential SNF placement once medically stable Ongoing goals of care discussions. Plans to further discuss advanced directives and wishes on tomorrow.   Symptom Management:  Pain related to neoplasm/metastatic disease PCA discontinued on 2/9 and 2000. IV Dilaudid available every 3 hours as needed for breakthrough if pain not relieved with oral medication. 2 doses over past 24 hours     Bisphosphonates can also be beneficial, however, he has not received recent dental care and consideration of risk versus benefit with bisphosphonates is necessary, particularly in regards to risk for osteonecrosis of the jaw.  Methadone 20mg  TID.  Lidocaine patch x2 every 24 hours Scheduled Tylenol 1000 mg 3 times daily Toradol 15 mg every 8 hours x 3  doses-completed Gabapentin 300 twice daily in addition to 600 mg nightly. Robaxin 500mg  as needed for muscle spasm Will premedicate with robaxin prior to radiation and ativan needed for anxiety in addition to 1-2mg  bolus of dilaudid available BID.  Oral dilaudid 4-6mg  as needed for breakthrough (1 dose over the past 24 hours)  Long-term, radiation is the best modality to hopefully help control his pain and  improve his functional status.  My concern is that it may be difficult to control his pain until we have reached a point where radiation begins to take effect.  Constipation MiraLAX twice daily Senna-S twice daily as needed Colace twice daily  Dyspepsia Protonix 40 mg twice daily Anxiety Ativan 0.5mg  BID as needed for anxiety.   Time Total: 50 min.   Visit consisted of counseling and education dealing with the complex and emotionally intense issues of symptom management and palliative care in the setting of serious and potentially life-threatening illness.Greater than 50%  of this time was spent counseling and coordinating care related to the above assessment and plan.  Alda Lea, AGPCNP-BC  Palliative Medicine Team [phone]

## 2021-05-22 NOTE — TOC Progression Note (Addendum)
Transition of Care (TOC) - Progression Note    Patient Details  Name: Randall Larsen. MRN: [id number] Date of Birth: [birth year]  Transition of Care [facility]) CM/SW Contact  [name], [name], RN Phone Number: 05/22/2021, 11:51 AM  Clinical Narrative:  [name] ends  today?Noted spinal cord compression-will await if any treatment. I will notify insurance patient not medically stable. -3p-auth cancelled-will need new auth if appropriate. For SNF.Patint states he has mediciad-checking w/financial navigator to confirm.     Expected Discharge Plan: [place] Barriers to Discharge: Continued Medical Work up  Expected Discharge Plan and Services Expected Discharge Plan: [place]   Discharge Planning Services: CM Consult   Living arrangements for the past 2 months: Single Family Home                                       Social Determinants of Health (SDOH) Interventions    Readmission Risk Interventions No flowsheet data found.

## 2021-05-22 NOTE — Progress Notes (Signed)
PROGRESS NOTE    Randall Larsen.  TTS:[id number] DOB: 1955/04/25 DOA: 05/01/2021 PCP: [name], MD  Chief Complaint  Patient presents with   Chest Pain    Brief Narrative:  Randall Larsen is Randall Larsen 66 y.o. 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  He was evaluated for urgent radiation therapy and started radiation treatment on 05/04/2021.  Palliative care following and assisting with pain management.      Assessment & Plan:   Principal Problem:   Cancer associated pain Active Problems:   Flaccid diplegia of lower extremities (HCC)   Prostate cancer metastatic to bone [facility])   Edema   Acute urinary retention   Chest pain   Fall   Essential hypertension   Bilateral lower extremity edema   Bone metastases (HCC)   Assessment and Plan: * Cancer associated pain- (present on admission) Pain is difficult to control Appreciate palliative care assistance with adjustment of medications - PCA d/c'd 2/9, had pain throughout the night.  He's on dexamethasone, methadone, lidocaine patch, APAP scheduled, toradol scheduled, gabapentin scheduled, robaxin with premedication for radiation He's not tolerated his recent radiation sessions - has dilaudid for premedication ordered   Flaccid diplegia of lower extremities (HCC) Developed 2/11 AM Significant cord compression from T4-T8 MRI L spine and thoracic spine ordered (didn't tolerate all of L spine) L spine with mild multifocatorial spinal stenosis L4-5, mild L1 and L2 compression fractures, widespread osseous metastasis MRI T spine innumerable osseous metastases throughout thoracic spine c/w hx metastatic prostate cancer, progressive height loss atT2,T3, T4, T5, T8, T9, T10, T11, T12 vertebral bodies, bony retropulsion and/or abnormal convex posterior contour seen at multiple levels,  most pronounced T2-T8.  Mild to moderate diffuse spinal stenosis from T2-T9, most pronounced T2-T4 and T8.  Mild to moderate bilateral bony foraminal narrowing at T1-2, through T5-6, most pronounced at T4-5.  Small layering effusions, L>R. IV steroids Neurosurgery did not think he had much change for meaningful recovery even with surgery Radiation oncology to continue therapy, modified Oncology will see him today Appreciate palliative care recs  Prostate cancer metastatic to bone [facility])- (present on admission) Sees Dr. Lorenso Courier as an outpatient S/p lupron 04/17/2020, continue q 3 months Plan for abiraterone 1000 mg PO daily on 2/6 (will defer to outpatient) Stop bicalutamide per oncology   Acute urinary retention Foley in place  Edema Echo with EF 60-65%, no RWMA, normal RVSF UA without proteinuria Continue lasix (lasix uptrending [place] bit today, decrease to once daily lasix) LE [place] without DVT Net negative, but still impressive edema  Fall Found on ground 2/10-11 PM - he denies falling, says he eased himself down  Chest pain- (present on admission) Suspect related to metastatic disease.  Do not suspect ACS.  Essential hypertension- (present on admission) Blood pressure still elevated Continue metoprolol, hydralazine Lasix   DVT prophylaxis: lovenox Code Status: full Family Communication: ex wife over phone 2/12 Disposition:   Status is: Inpatient Remains inpatient appropriate because: pending better pain control   Consultants:  Oncology palliative  Procedures:  none  Antimicrobials:  Anti-infectives (From admission, onward)    None       Subjective: No new complaints  Objective: Vitals:   05/22/21 0456 05/22/21 0500 05/22/21 1032 05/22/21 1601  BP: (!) 158/79  (!) 146/70 (!) 160/73  Pulse: 78  72 76  Resp: 16  20 17   Temp: 98.1 F (36.7 C)  98.7 F (37.1 C) (!) 97.5 F (36.4 C)  TempSrc: Oral  Oral Oral  SpO2: 98%  98% 93%  Weight:  100.1 kg     Height:        Intake/Output Summary (Last 24 hours) at 05/22/2021 1656 Last data filed at 05/22/2021 1512 Gross per 24 hour  Intake 1409.71 ml  Output 3680 ml  Net -2270.29 ml   Filed Weights   05/01/21 2023 05/22/21 0500  Weight: 99.1 kg 100.1 kg    Examination:  General: No acute distress. Cardiovascular: RRR Lungs: unlabored Abdomen: Soft, nontender, nondistended  Neurological: flicker in R toes, otherwise flaccid LE weakness Skin: Warm and dry. No rashes or lesions. Extremities: bilateral LE edema   Data Reviewed: I have personally reviewed following labs and imaging studies  CBC: Recent Labs  Lab 05/16/21 0505 05/19/21 0501 05/20/21 0455 05/21/21 0826 05/22/21 0415  WBC 7.1 7.3 7.4 7.7 6.6  NEUTROABS  --  5.0 5.3  --  5.0  HGB 10.0* 10.7* 9.6* 10.3* 9.2*  HCT 30.7* 33.1* 29.4* 30.7* 28.2*  MCV 92.5 94.6 93.6 91.9 93.4  PLT 181 164 143* 165 129*    Basic Metabolic Panel: Recent Labs  Lab 05/16/21 0505 05/19/21 0501 05/20/21 0455 05/21/21 0826 05/22/21 0415  NA 141 139 138 138 138  K 4.3 3.8 3.6 4.2 4.5  CL 105 104 104 103 104  CO2 28 26 27 27 27   GLUCOSE 102* 129* 104* 151* 125*  BUN 41* 43* 46* 47* 50*  CREATININE 1.07 1.19 0.82 1.13 1.34*  CALCIUM 9.1 9.2 8.9 8.8* 8.7*  MG 2.4 2.4 2.3  --  2.3  PHOS 4.4 4.7* 4.9*  --  3.9    GFR: Estimated Creatinine Clearance: 64.3 mL/min ([place]) (by C-G formula based on SCr of 1.34 mg/dL (H)).  Liver Function Tests: Recent Labs  Lab 05/19/21 0501 05/20/21 0455 05/21/21 0826 05/22/21 0415  AST 33 21 40 30  ALT 22 19 19 16   ALKPHOS 274* 244* 293* 286*  BILITOT 0.2* 0.3 0.4 0.2*  PROT 6.6 5.4* 6.3* 5.7*  ALBUMIN 3.9 3.4* 3.6 3.2*    CBG: No results for input(s): GLUCAP in the last 168 hours.   No results found for this or any previous visit (from the past 240 hour(s)).       Radiology Studies: MR THORACIC SPINE WO CONTRAST  Result Date: 05/20/2021 CLINICAL DATA:  Initial evaluation for  acute mid back pain, history of metastatic prostate cancer. EXAM: MRI THORACIC SPINE WITHOUT CONTRAST TECHNIQUE: Multiplanar, multisequence MR imaging of the thoracic spine was performed. No intravenous contrast was administered. COMPARISON:  CT from 02/05/2021. FINDINGS: Alignment:  Examination degraded by motion artifact. Mild dextroscoliosis. Alignment otherwise normal with preservation of the normal thoracic kyphosis. No listhesis. Vertebrae: Innumerable osseous metastases seen throughout the thoracic spine, consistent with history of metastatic prostate cancer. There is involvement of essentially all levels, with diffuse involvement of the visualized ribs. Since previous exam, there is progressive height loss at the T2, T3, T4, T5, T8, T9, T10, T11, and T12 vertebral bodies, consistent with interval pathologic compression fractures. Associated bony retropulsion and/or abnormal convex posterior contour now seen at multiple levels, most pronounced at T2, T3, T4, T6, and T8. Cord: Grossly normal signal and morphology. No appreciable cord signal changes on this motion degraded exam. Paraspinal and other soft tissues: Mild edema seen throughout the upper and mid paraspinous soft tissues,  presumably reactive. No collections. Small layering bilateral pleural effusions, left greater than right. Undo that Disc levels: No significant disc pathology seen within the thoracic spine. However, the bony retropulsion and/or posterior convex bowing seen at multiple levels extending from T2 through T9, in combination with prominence of the dorsal epidural fat results in mild to moderate diffuse spinal stenosis, most pronounced at T2 through T4 and T8. Mild flattening of the thoracic cord at several levels, most pronounced at T4 and T8, but no visible cord signal changes. Mild to moderate bilateral bony foraminal narrowing present at T1-2 through T5-6, most pronounced at T4-5 bilaterally. Otherwise, no foramina remain patent.  IMPRESSION: 1. Innumerable osseous metastases throughout the thoracic spine, consistent with history of metastatic prostate cancer. Progressive height loss at the T2, T3, T4, T5, T8, T9, T10, T11, and T12 vertebral bodies, consistent with interval pathologic compression fractures. Associated bony retropulsion and/or abnormal convex posterior contour now seen at multiple levels, most pronounced at T2 through T8. 2. Mild to moderate diffuse spinal stenosis extending from T2 through T9, most pronounced at T2 through T4 and T8. 3. Mild to moderate bilateral bony foraminal narrowing at T1-2 through T5-6, most pronounced at T4-5. 4. Small layering bilateral pleural effusions, left greater than right. Electronically Signed   By: Jeannine Boga M.D.   On: 05/20/2021 19:14        Scheduled Meds:  acetaminophen  1,000 mg Oral TID   celecoxib  200 mg Oral BID   Chlorhexidine Gluconate Cloth  6 each Topical Daily   dexamethasone (DECADRON) injection  4 mg Intravenous Q6H   docusate sodium  100 mg Oral Q12H   enoxaparin (LOVENOX) injection  40 mg Subcutaneous Q24H   [START ON 05/23/2021] furosemide  40 mg Intravenous Daily   gabapentin  300 mg Oral BID   gabapentin  600 mg Oral QHS   hydrALAZINE  50 mg Oral TID   lidocaine  2 patch Transdermal Q24H   methadone  20 mg Oral Q8H   metoprolol tartrate  25 mg Oral BID   pantoprazole  40 mg Oral BID   polyethylene glycol  17 g Oral BID   potassium chloride  20 mEq Oral Daily   senna-docusate  2 tablet Oral BID   Continuous Infusions:   LOS: 20 days    Time spent: over 30 min    Fayrene Helper, MD [facility]   To contact the attending provider between 7A-7P or the covering provider during after hours 7P-7A, please log into the web site [email or web address] and access using universal [place] password for that web site. If you do not have the password, please call the hospital operator.  05/22/2021, 4:56 PM

## 2021-05-23 ENCOUNTER — Ambulatory Visit
Admit: 2021-05-23 | Discharge: 2021-05-23 | Disposition: A | Discharge status: Home / Self Care | Payer: Medicare HMO | Attending: Radiation Oncology | Admitting: Radiation Oncology

## 2021-05-23 DIAGNOSIS — G893 Neoplasm related pain (acute) (chronic): Secondary | ICD-10-CM | POA: Diagnosis not present

## 2021-05-23 LAB — CBC
HCT: 30 % — ABNORMAL LOW (ref 39.0–52.0)
Hemoglobin: 9.8 g/dL — ABNORMAL LOW (ref 13.0–17.0)
MCH: 30.6 pg (ref 26.0–34.0)
MCHC: 32.7 g/dL (ref 30.0–36.0)
MCV: 93.8 fL (ref 80.0–100.0)
Platelets: 125 10*3/uL — ABNORMAL LOW (ref 150–400)
RBC: 3.2 MIL/uL — ABNORMAL LOW (ref 4.22–5.81)
RDW: 18.9 % — ABNORMAL HIGH (ref 11.5–15.5)
WBC: 7.6 10*3/uL (ref 4.0–10.5)
nRBC: 2.4 % — ABNORMAL HIGH (ref 0.0–0.2)

## 2021-05-23 LAB — BASIC METABOLIC PANEL
Anion gap: 5 (ref 5–15)
BUN: 47 mg/dL — ABNORMAL HIGH (ref 8–23)
CO2: 27 mmol/L (ref 22–32)
Calcium: 8.6 mg/dL — ABNORMAL LOW (ref 8.9–10.3)
Chloride: 104 mmol/L (ref 98–111)
Creatinine, Ser: 1.1 mg/dL (ref 0.61–1.24)
GFR, Estimated: >60 mL/min (ref >60)
Glucose, Bld: 127 mg/dL — ABNORMAL HIGH (ref 70–99)
Potassium: 4.5 mmol/L (ref 3.5–5.1)
Sodium: 136 mmol/L (ref 135–145)

## 2021-05-23 NOTE — Progress Notes (Signed)
Occupational Therapy Treatment Patient Details Name: Randall Larsen. 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  Patient was evaluated for urgent radiation therapy and has started radiation treatment on 05/04/2021.  Palliative care consulted to assist with the pain control currently on Dilaudid PCA.  Patient is started on methadone 15 mg 3 times daily. Developed flaccid paralysis, numbness/tingling of BLE 2/11 with concern for cord compression due to prostate cancer.   OT comments  OT treatment session with focus on self-care re-education, bed mobility, and pain management. Patient with noted sharp decline in function since last OT treatment session at which time patient was completing sit to stand transfers and functional mobility with RW and supervision to Min guard A. Patient with onset of BLE flaccid paralysis 2/11 currently requiring Max to Total A +2 for bed mobility and all ADLs at EOB/bed level. Unable to stand or sit fully upright at EOB despite +2 assist at this time. Patient remains motivated but continues to be greatly limited by pain. OT will continue to follow acutely.    Recommendations for follow up therapy are one component of a multi-disciplinary discharge planning process, led by the attending physician.  Recommendations may be updated based on patient status, additional functional criteria and insurance authorization.    Follow Up Recommendations  Skilled nursing-short term rehab (<3 hours/day)    Assistance Recommended at Discharge Frequent or constant Supervision/Assistance  Patient can return home with the following  A little help with walking and/or transfers;Assistance with  cooking/housework;Help with stairs or ramp for entrance;A lot of help with bathing/dressing/bathroom   Equipment Recommendations  Other (comment) (total hip kit)    Recommendations for Other Services      Precautions / Restrictions Precautions Precautions: Fall Precaution Comments: Bone METS ribs, pelvic, R hip, B LE edema Restrictions Weight Bearing Restrictions: No       Mobility Bed Mobility Overal bed mobility: Needs Assistance Bed Mobility: Rolling, Supine to Sit, Sit to Supine Rolling: Max assist, +2 for physical assistance, +2 for safety/equipment   Supine to sit: Max assist, +2 for physical assistance, +2 for safety/equipment Sit to supine: Max assist, +2 for physical assistance, +2 for safety/equipment   General bed mobility comments: Max to Total A +2 for all parts of bed mobility. Greatly limited by BLE flaccid paralysis and pain in R hip.    Transfers Overall transfer level: Needs assistance Equipment used: None               General transfer comment: Unable 2/2 BLE flaccid paralysis.     Balance Overall balance assessment: Needs assistance Sitting-balance support: Feet supported Sitting balance-Leahy Scale: Poor Sitting balance - Comments: Unable to sit upright without heavy external assist and HOB elevated to faciliate upright position. Rests heavily on L elbow. Limited by pain in R hip.                                   ADL either performed or assessed with clinical judgement   ADL Overall ADL's : Needs assistance/impaired Eating/Feeding: Independent   Grooming: Set up;Sitting   Upper Body Bathing: Moderate assistance;Sitting Upper Body Bathing Details (indicate cue type and  reason): Mod A seated EOB. HOB elevated to assist with upright position. L lateral lean secondary to pain in R hip. Lower Body Bathing: +2 for physical assistance;+2 for safety/equipment;Bed level;Maximal assistance Lower Body Bathing Details (indicate cue  type and reason): Patient able to wash upper thighs seated EOB with HOB elevated to support upright position. Return to supine to wash lower legs, buttocks and front perineal area. Upper Body Dressing : Moderate assistance;Sitting Upper Body Dressing Details (indicate cue type and reason): To don anterior hospital gown. Lower Body Dressing: Total assistance;+2 for physical assistance;+2 for safety/equipment;Bed level     Toilet Transfer Details (indicate cue type and reason): Unable Toileting- Clothing Manipulation and Hygiene: Total assistance;+2 for physical assistance;+2 for safety/equipment;Bed level Toileting - Clothing Manipulation Details (indicate cue type and reason): Patient incontinent of bowel requiring Total A +2 for hygiene/clothing management in supine.       General ADL Comments: Developed flaccid paralysis of BLE 2/2 cord compression. CLOF for LB ADLs grossly Max to Total A +2 at bed level. Poor sitting balance 2/2 pain.    Extremity/Trunk Assessment     Lower Extremity Assessment Lower Extremity Assessment:  (Flaccid paralysis of BLE)        Vision       Perception     Praxis      Cognition Arousal/Alertness: Awake/alert Behavior During Therapy: WFL for tasks assessed/performed Overall Cognitive Status: Within Functional Limits for tasks assessed                                          Exercises      Shoulder Instructions       General Comments Noted sharp decline in function 2/2 flaccid paralysis of BLE.    Pertinent Vitals/ Pain       Pain Assessment Pain Assessment: Faces Faces Pain Scale: Hurts whole lot Pain Location: chest/sternum and R hip Pain Descriptors / Indicators: Grimacing, Guarding, Sharp Pain Intervention(s): Limited activity within patient's tolerance, Monitored during session, Repositioned, Patient requesting pain meds-RN notified  Home Living                                          Prior  Functioning/Environment              Frequency  Min 2X/week        Progress Toward Goals  OT Goals(current goals can now be found in the care plan section)  Progress towards OT goals: Not progressing toward goals - comment (Please refer to general coment section above.)  Acute Rehab OT Goals Patient Stated Goal: To decrease pain. OT Goal Formulation: With patient Time For Goal Achievement: 06/01/21 Potential to Achieve Goals: Good ADL Goals Pt Will Perform Lower Body Dressing: with modified independence;sit to/from stand;with adaptive equipment Pt Will Transfer to Toilet: with modified independence;ambulating;regular height toilet;grab bars Pt Will Perform Toileting - Clothing Manipulation and hygiene: with modified independence;sit to/from stand Pt Will Perform Tub/Shower Transfer: with supervision;ambulating;shower seat;grab bars  Plan Discharge plan remains appropriate;Frequency remains appropriate    Co-evaluation                 AM-PAC OT "6 Clicks" Daily Activity     Outcome Measure   Help from another person eating meals?: None Help from another person  taking care of personal grooming?: A Little Help from another person toileting, which includes using toliet, bedpan, or urinal?: Total Help from another person bathing (including washing, rinsing, drying)?: Total Help from another person to put on and taking off regular upper body clothing?: A Lot Help from another person to put on and taking off regular lower body clothing?: Total 6 Click Score: 12    End of Session    OT Visit Diagnosis: Other abnormalities of gait and mobility (R26.89);Pain Pain - part of body:  (R hip and sternum)   Activity Tolerance Patient limited by pain   Patient Left in bed;with call bell/phone within reach;with bed alarm set   Nurse Communication Mobility status;Patient requests pain meds        Time: 6770-3403 OT Time Calculation (min): 30 min  Charges: OT General  Charges $OT Visit: 1 Visit OT Treatments $Self Care/Home Management : 23-37 mins  Elizabet Schweppe H. OTR/L Supplemental OT, Department of rehab services [phone]  Jaiyana Canale R H. 05/23/2021, 11:51 AM

## 2021-05-23 NOTE — Progress Notes (Signed)
Chaplain engaged in a follow-up visit on request of Chaplain Katy.  Chaplain introduced herself and spent some time getting to know Roselie Awkward.  Yandell was in quite a bit of pain after needing to be cleaned up and moved in the bed.  He became tearful and voiced that his chest was hurting.  Chaplain offered listening as he shared about the pain he was enduring in his body.  After Monico was able to become somewhat settled he shared about his faith and family.  Tayvion is interested in learning about a number of religions and would appreciate seeing an [place] let him know that she could work to get him the care he needs spiritually.   Octavius was also able to share about his ex-wife and children.  He voiced that he talks regularly to his children who are living up [place] in [place] he is from.  Cardin noted that people have shared about him moving back to be with his family.  Chaplain could assess that he has been able to build a support system through friends.  Eugene had some religious books that friends had bought to the hospital for him.   Chaplain offered listening, support and presence to him.  Chaplains will continue to follow-up.     05/23/21 1600  Clinical Encounter Type  Visited With Patient  Visit Type Initial  Referral From Chaplain  Consult/Referral To Chaplain  Spiritual Encounters  Spiritual Needs Emotional;Grief support  Stress Factors  Patient Stress Factors Major life changes;Health changes;Loss of control

## 2021-05-23 NOTE — Progress Notes (Signed)
PROGRESS NOTE    Randall Larsen.  NFA:[id number] DOB: [birth year] DOA: 05/01/2021 PCP: [name], MD  Chief Complaint  Patient presents with   Chest Pain    Brief Narrative:  Mr. Merrow is Randall Larsen 66 y.o. 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  He was evaluated for urgent radiation therapy and started radiation treatment on 05/04/2021.  Palliative care following and assisting with pain management.  He's had difficult and prolonged course with difficult to control pain.  On 2/10-2/11 he was found on the ground (denies falling and says he eased himself to ground).  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  He was seen by neurosurgery who did not recommend any surgical intervention.  Radiation oncology has adjusted his treatment plan.  Discharge currently pending improved pain control.  Potentially in the next few days.      Assessment & Plan:   Principal Problem:   Cancer associated pain Active Problems:   Flaccid diplegia of lower extremities (HCC)   Prostate cancer metastatic to bone [facility])   Edema   Acute urinary retention   Chest pain   Fall   Essential hypertension   Bilateral lower extremity edema   Bone metastases (HCC)   Assessment and Plan: * Cancer associated pain- (present on admission) Pain is difficult to control Appreciate palliative care assistance with adjustment of medications - PCA d/c'd 2/9, had pain throughout the night.  He's on dexamethasone, methadone, lidocaine patch, APAP scheduled, toradol scheduled, gabapentin scheduled, robaxin with premedication for radiation Tolerated radiation 2/13 and 2/14 - has dilaudid for premedication ordered   Flaccid diplegia of lower extremities ([place]) Developed 2/11 AM Significant cord compression from T4-T8 MRI L spine and thoracic  spine ordered (didn't tolerate all of L spine) L spine with mild multifocatorial spinal stenosis L4-5, mild L1 and L2 compression fractures, widespread osseous metastasis MRI T spine innumerable osseous metastases throughout thoracic spine c/w hx metastatic prostate cancer, progressive height loss atT2,T3, T4, T5, T8, T9, T10, T11, T12 vertebral bodies, bony retropulsion and/or abnormal convex posterior contour seen at multiple levels, most pronounced T2-T8.  Mild to moderate diffuse spinal stenosis from T2-T9, most pronounced T2-T4 and T8.  Mild to moderate bilateral bony foraminal narrowing at T1-2, through T5-6, most pronounced at T4-5.  Small layering effusions, L>R. IV steroids Neurosurgery did not think he had much change for meaningful recovery even with surgery Radiation oncology to continue therapy, modified Appreciate oncology assistance Appreciate palliative care recs  Prostate cancer metastatic to bone [facility])- (present on admission) Sees Dr. Lorenso Courier as an outpatient S/p lupron 04/17/2020, continue q 3 months Plan for abiraterone 1000 mg PO daily in outpatient setting per oncology Stop bicalutamide per oncology   Acute urinary retention Foley in place  Edema Echo with EF 60-65%, no RWMA, normal RVSF UA without proteinuria Continue daily lasix LE [place] without DVT Net negative  Fall Found on ground 2/10-11 PM - he denies falling, says he eased himself down This was before his weakness on 2/11 am, related?  Chest pain- (present on admission) Suspect related to metastatic disease.  Do not suspect ACS.  Essential hypertension- (present on admission) Blood pressure still elevated Continue metoprolol, hydralazine Lasix  DVT prophylaxis: lovenox Code Status: full Family Communication: ex wife over phone 2/12 Disposition:   Status is: Inpatient Remains inpatient appropriate  because: pending better pain control   Consultants:  Oncology Palliative Rad onc  Procedures:   none  Antimicrobials:  Anti-infectives (From admission, onward)    None       Subjective: No new complaints, continued pain  Objective: Vitals:   05/22/21 2131 05/23/21 0500 05/23/21 0637 05/23/21 1135  BP: (!) 173/84  (!) 157/74 (!) 140/94  Pulse: 77  75 87  Resp: 20  19 18   Temp:   98 F (36.7 C) 98.2 F (36.8 C)  TempSrc:   Oral Oral  SpO2: 94%  97% 99%  Weight:  99.6 kg    Height:        Intake/Output Summary (Last 24 hours) at 05/23/2021 1812 Last data filed at 05/23/2021 1324 Gross per 24 hour  Intake 240 ml  Output 3100 ml  Net -2860 ml   Filed Weights   05/01/21 2023 05/22/21 0500 05/23/21 0500  Weight: 99.1 kg 100.1 kg 99.6 kg    Examination:  General: No acute distress. Cardiovascular: RRR Lungs: unlabored Abdomen: Soft, nontender, nondistended Neurological: Alert and oriented 3. Moves all extremities 4 . Cranial nerves II through XII grossly intact. Skin: Warm and dry. No rashes or lesions. Extremities: bilateral LE edema    Data Reviewed: I have personally reviewed following labs and imaging studies  CBC: Recent Labs  Lab 05/19/21 0501 05/20/21 0455 05/21/21 0826 05/22/21 0415 05/23/21 0454  WBC 7.3 7.4 7.7 6.6 7.6  NEUTROABS 5.0 5.3  --  5.0  --   HGB 10.7* 9.6* 10.3* 9.2* 9.8*  HCT 33.1* 29.4* 30.7* 28.2* 30.0*  MCV 94.6 93.6 91.9 93.4 93.8  PLT 164 143* 165 129* 125*    Basic Metabolic Panel: Recent Labs  Lab 05/19/21 0501 05/20/21 0455 05/21/21 0826 05/22/21 0415 05/23/21 0454  NA 139 138 138 138 136  K 3.8 3.6 4.2 4.5 4.5  CL 104 104 103 104 104  CO2 26 27 27 27 27   GLUCOSE 129* 104* 151* 125* 127*  BUN 43* 46* 47* 50* 47*  CREATININE 1.19 0.82 1.13 1.34* 1.10  CALCIUM 9.2 8.9 8.8* 8.7* 8.6*  MG 2.4 2.3  --  2.3  --   PHOS 4.7* 4.9*  --  3.9  --     GFR: Estimated Creatinine Clearance: 78.1 mL/min (by C-G formula based on SCr of 1.1 mg/dL).  Liver Function Tests: Recent Labs  Lab 05/19/21 0501  05/20/21 0455 05/21/21 0826 05/22/21 0415  AST 33 21 40 30  ALT 22 19 19 16   ALKPHOS 274* 244* 293* 286*  BILITOT 0.2* 0.3 0.4 0.2*  PROT 6.6 5.4* 6.3* 5.7*  ALBUMIN 3.9 3.4* 3.6 3.2*    CBG: No results for input(s): GLUCAP in the last 168 hours.   No results found for this or any previous visit (from the past 240 hour(s)).       Radiology Studies: No results found.      Scheduled Meds:  acetaminophen  1,000 mg Oral TID   celecoxib  200 mg Oral BID   Chlorhexidine Gluconate Cloth  6 each Topical Daily   dexamethasone (DECADRON) injection  4 mg Intravenous Q6H   docusate sodium  100 mg Oral Q12H   enoxaparin (LOVENOX) injection  40 mg Subcutaneous Q24H   furosemide  40 mg Intravenous Daily   gabapentin  300 mg Oral BID   gabapentin  600 mg Oral QHS   hydrALAZINE  50 mg Oral TID   lidocaine  2 patch Transdermal Q24H  methadone  20 mg Oral Q8H   metoprolol tartrate  25 mg Oral BID   pantoprazole  40 mg Oral BID   polyethylene glycol  17 g Oral BID   potassium chloride  20 mEq Oral Daily   senna-docusate  2 tablet Oral BID   Continuous Infusions:   LOS: 21 days    Time spent: over 30 min    Fayrene Helper, MD [facility]   To contact the attending provider between 7A-7P or the covering provider during after hours 7P-7A, please log into the web site [email or web address] and access using universal [place] password for that web site. If you do not have the password, please call the hospital operator.  05/23/2021, 6:12 PM

## 2021-05-23 NOTE — Plan of Care (Signed)
  Problem: Activity: Goal: Risk for activity intolerance will decrease Outcome: Progressing   Problem: Nutrition: Goal: Adequate nutrition will be maintained Outcome: Progressing   Problem: Coping: Goal: Level of anxiety will decrease Outcome: Progressing   Problem: Pain Managment: Goal: General experience of comfort will improve Outcome: Progressing   

## 2021-05-23 NOTE — Progress Notes (Addendum)
Palliative Medicine Inpatient Follow Up Note    Mr. Tugwell sitting upright in bed. Appears comfortable. Reports having a good night overall. Is tolerating radiation. We revisited previous goals of care conversation. Mr. Schwabe expresses his understanding of his current situation, disease, and progression. He is clear that he is not interested in hospice or any consideration of not undergoing any and all available treatments. His goals are to hopefully show some improvement with radiation and possibly discharge to SNF for rehab.   I discussed realistic expectations with acknowledgement of his wishes. He knows to take things day by day and continuously evaluating outcomes will be most appropriate. Advanced Directive packet provided. He wishes to review and continue discussions with his ex-wife and daughter via phone. I offered to have virtual video visit to answer any further questions they may have and allow him to see their face given he has not seen them all since last summer.   Pain continues to be well managed on current regimen. He is appreciative of the ongoing support.   All questions answered and support provided.    Objective Assessment: Vital Signs Vitals:   05/23/21 0637 05/23/21 1135  BP: (!) 157/74 (!) 140/94  Pulse: 75 87  Resp: 19 18  Temp: 98 F (36.7 C) 98.2 F (36.8 C)  SpO2: 97% 99%    Intake/Output Summary (Last 24 hours) at 05/23/2021 1249 Last data filed at 05/23/2021 0640 Gross per 24 hour  Intake 809.71 ml  Output 2100 ml  Net -1290.29 ml    Last Weight  Most recent update: 05/23/2021  6:05 AM    Weight  99.6 kg (219 lb 7.8 oz)            Gen:  NAD, sitting upright in bed CV: Normal breathing pattern  PULM: clear to auscultation bilaterally.  EXT: bilateral lower extremity edema left >right, scrotal edema Neuro: Alert and oriented x3, bilateral lower extremity flaccid paralysis, able to feel touch but unable to move, lift, or wiggle toes  SUMMARY OF  RECOMMENDATIONS   Continue with current plan of care per medical team  Potential SNF placement once medically stable, most likely difficult disposition pending outcomes Ongoing goals of care discussions. Plans to further discuss advanced directives and wishes on tomorrow. Patient is clear in expressed wishes to continue to treat the treatable aggressively. He is not interested in hospice involvement at this time but knows this will need to be further considered in the near future.   Symptom Management:  Pain related to neoplasm/metastatic disease PCA discontinued on 2/9.Marland Kitchen IV Dilaudid available every 3 hours as needed for breakthrough not relieved with oral medication. 2 doses over past 48 hours     Bisphosphonates can also be beneficial, however, he has not received recent dental care and consideration of risk versus benefit with bisphosphonates is necessary, particularly in regards to risk for osteonecrosis of the jaw.  Methadone 20mg  TID.  Lidocaine patch x2 every 24 hours Scheduled Tylenol 1000 mg 3 times daily Celebrex 200mg  twice daily  Toradol 15 mg every 8 hours x 3 doses-completed Gabapentin 300 twice daily in addition to 600 mg nightly. Robaxin 500mg  as needed for muscle spasm Will premedicate with robaxin prior to radiation and ativan needed for anxiety in addition to 1-2mg  bolus of dilaudid available BID.  Oral dilaudid 4-6mg  as needed for breakthrough (3 doses over the past 24 hours).  Long-term, radiation is the best modality to hopefully help control his pain and improve his functional  status.  My concern is that it may be difficult to control his pain until we have reached a point where radiation begins to take effect.  Constipation MiraLAX twice daily Senna-S twice daily as needed Colace twice daily  Dyspepsia Protonix 40 mg twice daily Anxiety Ativan 0.5mg  BID as needed for anxiety.   Time Total: 25 min.   Visit consisted of counseling and education dealing with the  complex and emotionally intense issues of symptom management and palliative care in the setting of serious and potentially life-threatening illness.Greater than 50%  of this time was spent counseling and coordinating care related to the above assessment and plan.  Alda Lea, AGPCNP-BC  [employer]

## 2021-05-24 ENCOUNTER — Ambulatory Visit
Admit: 2021-05-24 | Discharge: 2021-05-24 | Disposition: A | Discharge status: Home / Self Care | Payer: Medicare HMO | Attending: Radiation Oncology | Admitting: Radiation Oncology

## 2021-05-24 DIAGNOSIS — R079 Chest pain, unspecified: Secondary | ICD-10-CM | POA: Diagnosis not present

## 2021-05-24 DIAGNOSIS — G893 Neoplasm related pain (acute) (chronic): Secondary | ICD-10-CM | POA: Diagnosis not present

## 2021-05-24 DIAGNOSIS — C7951 Secondary malignant neoplasm of bone: Secondary | ICD-10-CM | POA: Diagnosis not present

## 2021-05-24 DIAGNOSIS — R52 Pain, unspecified: Secondary | ICD-10-CM | POA: Diagnosis not present

## 2021-05-24 LAB — CBC WITH DIFFERENTIAL/PLATELET
Abs Immature Granulocytes: 0.25 10*3/uL — ABNORMAL HIGH (ref 0.00–0.07)
Basophils Absolute: 0 10*3/uL (ref 0.0–0.1)
Basophils Relative: 0 %
Eosinophils Absolute: 0 10*3/uL (ref 0.0–0.5)
Eosinophils Relative: 0 %
HCT: 31.5 % — ABNORMAL LOW (ref 39.0–52.0)
Hemoglobin: 10 g/dL — ABNORMAL LOW (ref 13.0–17.0)
Immature Granulocytes: 4 %
Lymphocytes Relative: 7 %
Lymphs Abs: 0.5 10*3/uL — ABNORMAL LOW (ref 0.7–4.0)
MCH: 30.5 pg (ref 26.0–34.0)
MCHC: 31.7 g/dL (ref 30.0–36.0)
MCV: 96 fL (ref 80.0–100.0)
Monocytes Absolute: 0.8 10*3/uL (ref 0.1–1.0)
Monocytes Relative: 11 %
Neutro Abs: 5.5 10*3/uL (ref 1.7–7.7)
Neutrophils Relative %: 78 %
Platelets: 122 10*3/uL — ABNORMAL LOW (ref 150–400)
RBC: 3.28 MIL/uL — ABNORMAL LOW (ref 4.22–5.81)
RDW: 18.9 % — ABNORMAL HIGH (ref 11.5–15.5)
WBC: 7 10*3/uL (ref 4.0–10.5)
nRBC: 4.1 % — ABNORMAL HIGH (ref 0.0–0.2)

## 2021-05-24 LAB — COMPREHENSIVE METABOLIC PANEL
ALT: 80 U/L — ABNORMAL HIGH (ref 0–44)
AST: 37 U/L (ref 15–41)
Albumin: 2.9 g/dL — ABNORMAL LOW (ref 3.5–5.0)
Alkaline Phosphatase: 246 U/L — ABNORMAL HIGH (ref 38–126)
Anion gap: 7 (ref 5–15)
BUN: 41 mg/dL — ABNORMAL HIGH (ref 8–23)
CO2: 26 mmol/L (ref 22–32)
Calcium: 8.6 mg/dL — ABNORMAL LOW (ref 8.9–10.3)
Chloride: 105 mmol/L (ref 98–111)
Creatinine, Ser: 1.02 mg/dL (ref 0.61–1.24)
GFR, Estimated: >60 mL/min (ref >60)
Glucose, Bld: 120 mg/dL — ABNORMAL HIGH (ref 70–99)
Potassium: 4.7 mmol/L (ref 3.5–5.1)
Sodium: 138 mmol/L (ref 135–145)
Total Bilirubin: 0.2 mg/dL — ABNORMAL LOW (ref 0.3–1.2)
Total Protein: 5.5 g/dL — ABNORMAL LOW (ref 6.5–8.1)

## 2021-05-24 LAB — MAGNESIUM: Magnesium: 2.4 mg/dL (ref 1.7–2.4)

## 2021-05-24 LAB — PHOSPHORUS: Phosphorus: 3.6 mg/dL (ref 2.5–4.6)

## 2021-05-24 NOTE — Progress Notes (Signed)
PROGRESS NOTE    Randall Larsen.  FOY:[id number] DOB: 01-Nov-1955 DOA: 05/01/2021  PCP: [name], MD  Chief Complaint  Patient presents with   Chest Pain    Brief Narrative:  Randall Larsen is a 66 y.o. 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  He was evaluated for urgent radiation therapy and started radiation treatment on 05/04/2021.  Palliative care following and assisting with pain management.  He's had difficult and prolonged course with difficult to control pain.  On 2/10-2/11 he was found on the ground (denies falling and says he eased himself to ground).  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  He was seen by neurosurgery who did not recommend any surgical intervention.  Radiation oncology has adjusted his treatment plan.  Discharge currently pending improved pain control.  Potentially in the next few days.    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 Appreciate palliative care assistance with adjustment of medications - PCA d/c'd on 2/9, had pain throughout the night.  He is on dexamethasone, methadone, lidocaine patch, APAP scheduled, toradol scheduled, gabapentin scheduled, robaxin with premedication for radiation. Tolerated radiation 2/13, 2/14 and 2/15 - Continue dilaudid for premedication before radiation Rx.  Acute urinary retention Foley in place  Flaccid diplegia of lower extremities  ([place]) Developed flaccid paralysis of the extremities on 2/11 AM Significant cord compression noted from T4-T8. MRI L spine with mild multifocatorial spinal stenosis L4-5, mild L1 and L2 compression fractures, widespread osseous metastasis. MRI T spine innumerable osseous metastases throughout thoracic spine c/w hx metastatic prostate cancer, progressive height loss at T2,T3, T4, T5, T8, T9, T10, T11, T12 vertebral bodies, bony retropulsion and/or abnormal convex posterior contour seen at multiple levels, most pronounced T2-T8. Continue IV steroids Neurosurgery did not think he had much chance for meaningful recovery even with surgery Radiation oncology to continue therapy, modified. Appreciate oncology assistance Appreciate palliative care recs  Fall Found on ground on 2/10-11 PM - He denies falling, states he eased himself down This was before his weakness on 2/11 am, related.  Edema Echo with EF 60-65%, no RWMA, normal RVSF UA without proteinuria. Continue daily lasix LE [place] without DVT Net negative  Chest pain- (present on admission) Suspect related to metastatic disease.  Do not suspect ACS.  Essential hypertension- (present on admission) Blood pressure still remains elevated due to ongoing pain. Continue metoprolol, hydralazine Lasix  Prostate cancer metastatic to bone [facility])- (present on admission) Follows with Dr. Lorenso Courier as an outpatient. S/p lupron injection 04/17/2020, continue q 3 months Plan for abiraterone 1000 mg PO daily in outpatient setting per oncology. Stop bicalutamide per oncology   DVT prophylaxis: lovenox Code Status: full Family Communication: No family at bedside.  Disposition:   Status is: Inpatient Remains inpatient appropriate because: pending better pain control   Consultants:  Oncology Palliative Rad onc  Procedures:  none  Antimicrobials:  Anti-infectives (From admission, onward)  None       Subjective: Patient was seen and  examined at bedside.  Overnight events noted.  Patient reports feeling better. He was going to radiation treatment.  Objective: Vitals:   05/23/21 1135 05/23/21 2112 05/24/21 0500 05/24/21 0653  BP: (!) 140/94 (!) 150/74  (!) 162/83  Pulse: 87 79  72  Resp: 18 20  18   Temp: 98.2 F (36.8 C) 98.9 F (37.2 C)  97.9 F (36.6 C)  TempSrc: Oral Oral  Oral  SpO2: 99% 96%  100%  Weight:   99.9 kg   Height:        Intake/Output Summary (Last 24 hours) at 05/24/2021 1335 Last data filed at 05/24/2021 1132 Gross per 24 hour  Intake 360 ml  Output 1350 ml  Net -990 ml   Filed Weights   05/22/21 0500 05/23/21 0500 05/24/21 0500  Weight: 100.1 kg 99.6 kg 99.9 kg    Examination:  General: Appears comfortable, not in any acute distress.  Appears stated. Cardiovascular: S1-S2 heard, regular rate and rhythm, no murmur. Lungs: Clear to auscultation bilaterally, no wheezing, no crackles.  Normal respiratory effort. Abdomen: Soft, nontender, nondistended, BS+ Neurological: Alert and oriented x3, moves all 4 extremities. Skin: No rash or lesions. Extremities: Bilateral pitting edema+, no cyanosis, no clubbing.    Data Reviewed: I have personally reviewed following labs and imaging studies  CBC: Recent Labs  Lab 05/19/21 0501 05/20/21 0455 05/21/21 0826 05/22/21 0415 05/23/21 0454 05/24/21 0525  WBC 7.3 7.4 7.7 6.6 7.6 7.0  NEUTROABS 5.0 5.3  --  5.0  --  5.5  HGB 10.7* 9.6* 10.3* 9.2* 9.8* 10.0*  HCT 33.1* 29.4* 30.7* 28.2* 30.0* 31.5*  MCV 94.6 93.6 91.9 93.4 93.8 96.0  PLT 164 143* 165 129* 125* 122*    Basic Metabolic Panel: Recent Labs  Lab 05/19/21 0501 05/20/21 0455 05/21/21 0826 05/22/21 0415 05/23/21 0454 05/24/21 0525  NA 139 138 138 138 136 138  K 3.8 3.6 4.2 4.5 4.5 4.7  CL 104 104 103 104 104 105  CO2 26 27 27 27 27 26   GLUCOSE 129* 104* 151* 125* 127* 120*  BUN 43* 46* 47* 50* 47* 41*  CREATININE 1.19 0.82 1.13 1.34* 1.10 1.02  CALCIUM 9.2 8.9 8.8*  8.7* 8.6* 8.6*  MG 2.4 2.3  --  2.3  --  2.4  PHOS 4.7* 4.9*  --  3.9  --  3.6    GFR: Estimated Creatinine Clearance: 84.4 mL/min (by C-G formula based on SCr of 1.02 mg/dL).  Liver Function Tests: Recent Labs  Lab 05/19/21 0501 05/20/21 0455 05/21/21 0826 05/22/21 0415 05/24/21 0525  AST 33 21 40 30 37  ALT 22 19 19 16  80*  ALKPHOS 274* 244* 293* 286* 246*  BILITOT 0.2* 0.3 0.4 0.2* 0.2*  PROT 6.6 5.4* 6.3* 5.7* 5.5*  ALBUMIN 3.9 3.4* 3.6 3.2* 2.9*    CBG: No results for input(s): GLUCAP in the last 168 hours.   No results found for this or any previous visit (from the past 240 hour(s)).   Radiology Studies: No results found.  Scheduled Meds:  acetaminophen  1,000 mg Oral TID   celecoxib  200 mg Oral BID   Chlorhexidine Gluconate Cloth  6 each Topical Daily   dexamethasone (DECADRON) injection  4 mg Intravenous Q6H   docusate sodium  100 mg Oral Q12H   enoxaparin (LOVENOX) injection  40 mg Subcutaneous Q24H   furosemide  40 mg Intravenous Daily  gabapentin  300 mg Oral BID   gabapentin  600 mg Oral QHS   hydrALAZINE  50 mg Oral TID   lidocaine  2 patch Transdermal Q24H   methadone  20 mg Oral Q8H   metoprolol tartrate  25 mg Oral BID   pantoprazole  40 mg Oral BID   polyethylene glycol  17 g Oral BID   potassium chloride  20 mEq Oral Daily   senna-docusate  2 tablet Oral BID   Continuous Infusions:   LOS: 22 days    Time spent: over 35 min    Shawna Clamp, MD [facility]   To contact the attending provider between 7A-7P or the covering provider during after hours 7P-7A, please log into the web site [email or web address] and access using universal [place] password for that web site. If you do not have the password, please call the hospital operator.  05/24/2021, 1:35 PM

## 2021-05-24 NOTE — TOC Progression Note (Signed)
Transition of Care (TOC) - Progression Note    Patient Details  Name: Randall Larsen. MRN: [id number] Date of Birth: 1955/09/09  Transition of Care [facility]) CM/SW Contact  Malavika Lira, [name], RN Phone Number: 05/24/2021, 11:27 AM  Clinical Narrative: Noted iv pain control issues.MD aware that xrt has been arranged for transport back & forth if needed while @ SNF([facility] medical stability-will await to start auth.      Expected Discharge Plan: [place] Barriers to Discharge: Continued Medical Work up  Expected Discharge Plan and Services Expected Discharge Plan: [place]   Discharge Planning Services: CM Consult   Living arrangements for the past 2 months: Single Family Home                                       Social Determinants of Health (SDOH) Interventions    Readmission Risk Interventions No flowsheet data found.

## 2021-05-24 NOTE — Progress Notes (Signed)
PT Cancellation Note  Patient Details Name: Wylee Dorantes. MRN: [id number] DOB: [birth year]   Cancelled Treatment:    Reason Eval/Treat Not Completed: Patient declined, no reason specified Pt had radiation this morning and RN notified pt that PT was planning on working with him this afternoon.  Upon checking with pt, he was upset that therapy was not a coordinated time, wanted to eat lunch and also had a visitor.  PT to check back as schedule permits.     [name] 05/24/2021, 1:52 PM Arlyce Dice, DPT Acute Rehabilitation Services Pager: [phone] Office: [phone]

## 2021-05-25 ENCOUNTER — Ambulatory Visit
Admit: 2021-05-25 | Discharge: 2021-05-25 | Disposition: A | Discharge status: Home / Self Care | Payer: Medicare HMO | Attending: Radiation Oncology | Admitting: Radiation Oncology

## 2021-05-25 ENCOUNTER — Ambulatory Visit: Payer: Medicare HMO

## 2021-05-25 NOTE — Progress Notes (Addendum)
Physical Therapy Treatment Patient Details Name: Randall Larsen. MRN: [id number] DOB: [birth year] Today's Date: 05/25/2021   History of Present Illness This 66 years old male with PMH significant for prostate cancer metastatic to the anterior ribs, left iliac, bilateral scapula, pubic rami, bilateral proximal femurs came in the ED with worsening chest pain related to his metastatic disease.  Patient was evaluated for urgent radiation therapy and has started radiation treatment on 05/04/2021. Marland Kitchen Developed flaccid paralysis, numbness/tingling of BLE 2/11 with cord compression T4-T8 due to mets to spine.    PT Comments    Attempted supine to sit with +2 total assist. Pt unable to tolerate coming up to full upright sitting position 2* severe sternal pain, so assisted pt back to supine. Noted sensation to light touch is absent B feet, he did have some proprioceptive sensation B feet (he could sense passive movement of B great toes). Instructed pt in BUE strengthening exercises with theraband.  SNF recommended. Pt is pleasant and puts forth good effort. Mechanical lift recommended for transfers. Goals downgraded 2* recent onset of BLE paralysis.     Recommendations for follow up therapy are one component of a multi-disciplinary discharge planning process, led by the attending physician.  Recommendations may be updated based on patient status, additional functional criteria and insurance authorization.  Follow Up Recommendations  Skilled nursing-short term rehab (<3 hours/day)     Assistance Recommended at Discharge Frequent or constant Supervision/Assistance  Patient can return home with the following Two people to help with walking and/or transfers;Assistance with cooking/housework;Two people to help with bathing/dressing/bathroom;Assist for transportation;Help with stairs or ramp for entrance   Equipment Recommendations  Wheelchair (measurements PT);Wheelchair cushion (measurements PT)     Recommendations for Other Services       Precautions / Restrictions Precautions Precautions: Fall Precaution Comments: Bone METS ribs, pelvic, R hip, spine B LE edema Restrictions Weight Bearing Restrictions: No     Mobility  Bed Mobility   Bed Mobility: Supine to Sit     Supine to sit: Total assist, +2 for physical assistance Sit to supine: Total assist, +2 for physical assistance   General bed mobility comments: attempted supine to sit with +2 total assist, 1/2 way to upright position pt reported he couldn't tolerate further activity due to sternal pain, so returned to supine.    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, radio from [employer] Exercises - Upper Extremity Shoulder Flexion:  AROM, Both, 10 reps, Theraband, Supine Theraband Level (Shoulder Flexion): Level 3 (Green) Shoulder Horizontal ADduction: AROM, Theraband, 10 reps, Supine Theraband Level (Shoulder Horizontal Adduction): Level 3 (Green) General Exercises - Lower Extremity Ankle Circles/Pumps: PROM, Both, 10 reps, Supine    General Comments        Pertinent Vitals/Pain Pain Assessment Pain Score: 6  Pain Location: chest/sternum Pain Descriptors / Indicators: Grimacing, Guarding, Sharp Pain Intervention(s): Limited activity within patient's tolerance, Monitored during session, RN gave pain meds during session, Repositioned    Home Living                           Prior Function            PT Goals (current goals can now be found in the care plan section) Acute Rehab PT Goals Patient Stated Goal: To not have pain, work on UE strengthening PT Goal Formulation: With patient Time For Goal Achievement: 06/02/21 Potential to Achieve Goals: Fair Progress towards PT goals: Not progressing toward goals - comment (limited by pain)    Frequency    Min 2X/week      PT Plan Current plan remains appropriate    Co-evaluation              AM-PAC PT "6 Clicks" Mobility   Outcome Measure  Help needed turning from your back to your side while in a flat bed without using bedrails?: A Lot Help needed moving from lying on your back to sitting on the side of a flat bed without using bedrails?: Total Help needed moving to and from a bed to a chair (including a wheelchair)?: Total Help needed standing up from a chair using your arms (e.g., wheelchair or bedside chair)?: Total Help needed to walk in hospital room?: Total Help needed climbing 3-5 steps with a railing? : Total 6 Click Score: 7    End of Session   Activity Tolerance: Patient limited by pain Patient left: in bed;with call bell/phone within reach;with bed alarm set Nurse Communication: Mobility status;Need for lift equipment PT Visit Diagnosis: Muscle weakness (generalized) (M62.81);Difficulty in walking, not elsewhere classified (R26.2);Other symptoms and signs involving the nervous system (R29.898)     Time: 5852-7782 PT Time Calculation (min) (ACUTE ONLY): 23 min  Charges:  $Therapeutic Exercise: 8-22 mins $Therapeutic Activity: 8-22 mins                     Blondell Reveal Kistler PT 05/25/2021  Acute Rehabilitation Services Pager [phone] Office [phone]

## 2021-05-25 NOTE — Progress Notes (Signed)
PROGRESS NOTE    Randall Larsen.  NID:[id number] DOB: 1955-11-19 DOA: 05/01/2021  PCP: [name], MD  Chief Complaint  Patient presents with   Chest Pain    Brief Narrative:  Randall Larsen is a 66 y.o. 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  He was evaluated for urgent radiation therapy and started radiation treatment on 05/04/2021.  Palliative care following and assisting with pain management.  He's had difficult and prolonged course with difficult to control pain.  On 2/10-2/11 he was found on the ground (denies falling and says he eased himself to ground).  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  He was seen by neurosurgery who did not recommend any surgical intervention.  Radiation oncology has adjusted his treatment plan.  Discharge currently pending improved pain control.  Potentially in the next few days.    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 Appreciate palliative care assistance with adjustment of medications - PCA d/c'd on 2/9, had pain throughout the night. He is on dexamethasone, methadone, lidocaine patch, APAP scheduled, toradol scheduled, gabapentin scheduled, robaxin with premedication for radiation. Tolerated radiation 2/13, 2/14 and 2/15, 2/16 - Continue dilaudid for premedication before radiation Rx. Plan is to switch to p.o. pain medications.  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 MRI L spine with mild multifocatorial spinal stenosis L4-5, mild L1 and L2 compression fractures, widespread osseous metastasis. MRI T spine innumerable osseous metastases throughout thoracic spine c/w hx metastatic prostate cancer, progressive height loss at T2,T3, T4, T5, T8, T9, T10, T11, T12 vertebral bodies, bony retropulsion and/or abnormal convex posterior contour seen at multiple levels, most pronounced T2-T8. Continue IV steroids Neurosurgery did not think he had much chance for meaningful recovery even with surgery Radiation oncology to continue therapy, regimen  modified. Appreciate oncology assistance Appreciate palliative care recs.  Fall Found on ground on 2/10-11 PM - He denies falling, states he eased himself down This was before his weakness on 2/11 am, related.  Edema Echo with EF 60-65%, no RWMA, normal RVSF UA without proteinuria. Continue daily lasix LE [place] without DVT Net negative  Chest pain- (present on admission) Suspect related to metastatic disease.  Do not suspect ACS.  Essential hypertension- (present on admission) Blood pressure still remains elevated due to ongoing pain. Continue metoprolol, hydralazine. Continue Lasix  Prostate cancer metastatic to bone [facility])- (present on admission) He follows with Dr. Lorenso Courier as an outpatient. S/p lupron injection on 04/17/2020, continue q 3 months. Plan for abiraterone 1000 mg PO daily in outpatient setting per oncology. Stop bicalutamide per oncology   DVT prophylaxis: lovenox Code Status: full Family Communication: No family at bedside.  Disposition: SNF once pain is manageable with oral pain meds.  Status is: Inpatient Remains inpatient appropriate because: pending better  pain control   Consultants:  Oncology Palliative Rad onc  Procedures:   none  Antimicrobials:  Anti-infectives (From admission, onward)    None       Subjective: Patient was seen and examined at bedside.  Overnight events noted. He reports pain is now reasonably controlled, he is tolerating radiation therapy with premedication.  Objective: Vitals:   05/24/21 2107 05/25/21 0454 05/25/21 0501 05/25/21 1215  BP: (!) 154/75 (!) 167/74  (!) 160/81  Pulse: 76 72  66  Resp: 18 17  (!) 22  Temp: 98.2 F (36.8 C) 98.7 F (37.1 C)  98.2 F (36.8 C)  TempSrc: Oral Oral  Oral  SpO2: 91% 98%  100%  Weight:   102.8 kg   Height:        Intake/Output Summary (Last 24 hours) at 05/25/2021 1403 Last data filed at 05/25/2021 1142 Gross per 24 hour  Intake 960 ml  Output 4250 ml  Net -3290 ml   Filed Weights   05/23/21 0500 05/24/21 0500 05/25/21 0501  Weight: 99.6 kg 99.9 kg 102.8 kg    Examination:  General: Appears comfortable, not in any acute distress.  Deconditioned Cardiovascular: S1-S2 heard, regular rate and rhythm, no murmur. Lungs: Clear to auscultation bilaterally, no wheezing, no crackles.  Normal respiratory effort. Abdomen: Soft, nontender, nondistended, BS+ Neurological: Alert and oriented x3, moves all 4 extremities. Skin: No rash or lesions. Extremities: Bilateral pitting edema+, no cyanosis, no clubbing.    Data Reviewed: I have personally reviewed following labs and imaging studies  CBC: Recent Labs  Lab 05/19/21 0501 05/20/21 0455 05/21/21 0826 05/22/21 0415 05/23/21 0454 05/24/21 0525  WBC 7.3 7.4 7.7 6.6 7.6 7.0  NEUTROABS 5.0 5.3  --  5.0  --  5.5  HGB 10.7* 9.6* 10.3* 9.2* 9.8* 10.0*  HCT 33.1* 29.4* 30.7* 28.2* 30.0* 31.5*  MCV 94.6 93.6 91.9 93.4 93.8 96.0  PLT 164 143* 165 129* 125* 122*    Basic Metabolic Panel: Recent Labs  Lab 05/19/21 0501 05/20/21 0455 05/21/21 0826 05/22/21 0415 05/23/21 0454 05/24/21 0525  NA 139 138 138 138 136 138  K 3.8 3.6 4.2 4.5 4.5 4.7  CL 104 104 103 104 104 105   CO2 26 27 27 27 27 26   GLUCOSE 129* 104* 151* 125* 127* 120*  BUN 43* 46* 47* 50* 47* 41*  CREATININE 1.19 0.82 1.13 1.34* 1.10 1.02  CALCIUM 9.2 8.9 8.8* 8.7* 8.6* 8.6*  MG 2.4 2.3  --  2.3  --  2.4  PHOS 4.7* 4.9*  --  3.9  --  3.6    GFR: Estimated Creatinine Clearance: 85.5 mL/min (by C-G formula based on SCr of 1.02 mg/dL).  Liver Function Tests: Recent Labs  Lab 05/19/21 0501 05/20/21 0455 05/21/21 0826 05/22/21 0415 05/24/21 0525  AST 33 21 40 30 37  ALT 22 19 19 16  80*  ALKPHOS 274* 244* 293* 286* 246*  BILITOT 0.2* 0.3 0.4 0.2* 0.2*  PROT 6.6 5.4* 6.3* 5.7* 5.5*  ALBUMIN 3.9 3.4* 3.6 3.2* 2.9*    CBG: No results for input(s): GLUCAP in the last 168 hours.   No results found for this or any previous visit (from the past 240 hour(s)).   Radiology Studies: No results found.  Scheduled Meds:  acetaminophen  1,000 mg Oral TID   celecoxib  200 mg Oral BID   Chlorhexidine Gluconate Cloth  6 each Topical Daily   dexamethasone (DECADRON) injection  4 mg Intravenous Q6H  docusate sodium  100 mg Oral Q12H   furosemide  40 mg Intravenous Daily   gabapentin  300 mg Oral BID   gabapentin  600 mg Oral QHS   hydrALAZINE  50 mg Oral TID   lidocaine  2 patch Transdermal Q24H   methadone  20 mg Oral Q8H   metoprolol tartrate  25 mg Oral BID   pantoprazole  40 mg Oral BID   polyethylene glycol  17 g Oral BID   potassium chloride  20 mEq Oral Daily   senna-docusate  2 tablet Oral BID   Continuous Infusions:   LOS: 23 days    Time spent: over 35 min    Shawna Clamp, MD [facility]   To contact the attending provider between 7A-7P or the covering provider during after hours 7P-7A, please log into the web site [email or web address] and access using universal [place] password for that web site. If you do not have the password, please call the hospital operator.  05/25/2021, 2:03 PM

## 2021-05-25 NOTE — Progress Notes (Signed)
Chaplain checked in with [facility].  He seemed to be dozing off due to his pain medicine.  Chaplain just let him know she was there if needed.  He then requested some help because he needed to be cleaned up and sat up in the bed.  Chaplain alerted Nurse Tech.  Chaplain offered support and listening to [place] as he expressed how he was feeling today.     05/25/21 1200  Clinical Encounter Type  Visited With Patient  Visit Type Follow-up

## 2021-05-25 NOTE — TOC Progression Note (Signed)
Transition of Care (TOC) - Progression Note    Patient Details  Name: Amy Belloso. MRN: [id number] Date of Birth: [birth year]  Transition of Care [facility]) CM/SW Contact  Braedon Sjogren, [name], RN Phone Number: 05/25/2021, 10:00 AM  Clinical Narrative: Will await PT note prior starting auth if SNF appropriate within 48hrs of d/c provided pain controlled on po meds.      Expected Discharge Plan: [place] Barriers to Discharge: Continued Medical Work up  Expected Discharge Plan and Services Expected Discharge Plan: [place]   Discharge Planning Services: CM Consult   Living arrangements for the past 2 months: Single Family Home                                       Social Determinants of Health (SDOH) Interventions    Readmission Risk Interventions No flowsheet data found.

## 2021-05-26 ENCOUNTER — Ambulatory Visit
Admit: 2021-05-26 | Discharge: 2021-05-26 | Disposition: A | Discharge status: Home / Self Care | Payer: Medicare HMO | Attending: Radiation Oncology | Admitting: Radiation Oncology

## 2021-05-26 ENCOUNTER — Ambulatory Visit: Payer: Medicare HMO

## 2021-05-26 ENCOUNTER — Encounter: Payer: Self-pay | Admitting: Radiation Oncology

## 2021-05-26 DIAGNOSIS — G893 Neoplasm related pain (acute) (chronic): Secondary | ICD-10-CM | POA: Diagnosis not present

## 2021-05-26 DIAGNOSIS — R600 Localized edema: Secondary | ICD-10-CM

## 2021-05-26 DIAGNOSIS — R52 Pain, unspecified: Secondary | ICD-10-CM | POA: Diagnosis not present

## 2021-05-26 DIAGNOSIS — C7951 Secondary malignant neoplasm of bone: Secondary | ICD-10-CM | POA: Diagnosis not present

## 2021-05-26 NOTE — TOC Progression Note (Signed)
Transition of Care (TOC) - Progression Note    Patient Details  Name: Randall Larsen. MRN: [id number] Date of Birth: [birth year]  Transition of Care [facility]) CM/SW Contact  Louisa Favaro, [name], RN Phone Number: 05/26/2021, 10:31 AM  Clinical Narrative:  Rogelia Rohrer for SNF @ [facility] rehab rep Carolyne Fiscal [name] [id number] auth from 2/17-2/21.     Expected Discharge Plan: Skilled Nursing Facility Barriers to Discharge: Insurance Authorization  Expected Discharge Plan and Services Expected Discharge Plan: [place]   Discharge Planning Services: CM Consult   Living arrangements for the past 2 months: Single Family Home                                       Social Determinants of Health (SDOH) Interventions    Readmission Risk Interventions No flowsheet data found.

## 2021-05-26 NOTE — Progress Notes (Signed)
Chaplain engaged in a follow up visit with Randall Larsen.  He became tearful as he talked about an event that occurred over the weekend when he wasn't able to walk.  He is having a difficult time with the idea that he may not be able to walk again.  He shared about his determination to try and about his faith and how we don't always have the final answer.  Chaplain provided listening and support. He was very grateful for the visit.  [address] [place] Pager, [phone]

## 2021-05-26 NOTE — Progress Notes (Signed)
Palliative Medicine Inpatient Follow Up Note    Randall Larsen sitting upright in bed. States he is tired and would like to take a nap. Pain is well controlled, which he is appreciative of. Continues to express wishes to treat the treatable. He is looking forward to going to rehab and continuing with therapy. Expresses he is not interested in "throwing in the towel" until he has given it his all and every effort has been put forward.   He has advanced directive paperwork and will complete at a later time. States he is still in discussions with his family.   All questions answered and support provided.   Objective Assessment: Vital Signs Vitals:   05/26/21 0418 05/26/21 0746  BP: (!) 170/77 (!) 151/69  Pulse: 78   Resp: 18   Temp: (!) 97.5 F (36.4 C)   SpO2: 98%     Intake/Output Summary (Last 24 hours) at 05/26/2021 1629 Last data filed at 05/26/2021 1300 Gross per 24 hour  Intake 1680 ml  Output 2200 ml  Net -520 ml    Last Weight  Most recent update: 05/26/2021  5:33 AM    Weight  99.8 kg (220 lb)            Gen:  NAD, sitting upright in bed CV: Normal breathing pattern  PULM: clear to auscultation bilaterally.  EXT: bilateral lower extremity edema left >right, Neuro: Alert and oriented x3, bilateral lower extremity flaccid paralysis, able to feel touch but unable to move, lift, or wiggle toes  SUMMARY OF RECOMMENDATIONS   Continue with current plan of care per medical team  Potential SNF rehab this weekend.  Ongoing goals of care discussions. Plans to further discuss advanced directives and wishes on tomorrow. Patient is clear in expressed wishes to continue to treat the treatable aggressively. He is not interested in hospice involvement at this time but knows this will need to be further considered in the near future.   Symptom Management:  Pain related to neoplasm/metastatic disease PCA discontinued on 2/9.Marland Kitchen IV Dilaudid available every 3 hours as needed for  breakthrough not relieved with oral medication. 2 doses over past 72 hours     Bisphosphonates can also be beneficial, however, he has not received recent dental care and consideration of risk versus benefit with bisphosphonates is necessary, particularly in regards to risk for osteonecrosis of the jaw.  Methadone 20mg  TID.  Lidocaine patch x2 every 24 hours Scheduled Tylenol 1000 mg 3 times daily Celebrex 200mg  twice daily  Toradol 15 mg every 8 hours x 3 doses-completed Gabapentin 300 twice daily in addition to 600 mg nightly. Robaxin 500mg  as needed for muscle spasm Will premedicate with robaxin prior to radiation and ativan needed for anxiety in addition to 1-2mg  bolus of dilaudid available BID.  Oral dilaudid 4-6mg  as needed for breakthrough (2 doses over the past 24 hours).  Long-term, radiation is the best modality to hopefully help control his pain and improve his functional status.  My concern is that it may be difficult to control his pain until we have reached a point where radiation begins to take effect.  Constipation MiraLAX twice daily Senna-S twice daily as needed Colace twice daily  Dyspepsia Protonix 40 mg twice daily Anxiety Ativan 0.5mg  BID as needed for anxiety.   Time Total: 20 min.   Visit consisted of counseling and education dealing with the complex and emotionally intense issues of symptom management and palliative care in the setting of serious and potentially  life-threatening illness.Greater than 50%  of this time was spent counseling and coordinating care related to the above assessment and plan.  Randall Larsen, AGPCNP-BC  Palliative Medicine Team [phone]

## 2021-05-26 NOTE — Progress Notes (Signed)
PROGRESS NOTE    Randall Larsen.  NTI:[id number] DOB: 1955-10-01 DOA: 05/01/2021  PCP: [name], MD  Chief Complaint  Patient presents with   Chest Pain    Brief Narrative:  Randall Larsen is a 66 y.o. 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  He was evaluated for urgent radiation therapy and started radiation treatment on 05/04/2021.  Palliative care following and assisting with pain management.  He's had difficult and prolonged course with difficult to control pain.  On 2/10-2/11 he was found on the ground (denies falling and says he eased himself to ground).  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  He was seen by neurosurgery who did not recommend any surgical intervention.  Radiation oncology has adjusted his treatment plan.  Discharge currently pending improved pain control.  Potentially in the next few days.    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 Appreciate palliative care assistance with adjustment of medications - PCA d/c'd on 2/9, had pain throughout the night. He is on dexamethasone, methadone, lidocaine patch, APAP scheduled, toradol scheduled, gabapentin scheduled, robaxin with premedication for radiation. Tolerated radiation this week - Continue dilaudid for premedication before radiation Rx. Plan is to switch to p.o. pain medications.  Acute urinary retention Foley in place  Flaccid  diplegia of lower extremities ([place]) Developed flaccid paralysis of the extremities on 2/11 AM Significant cord compression noted from T4-T8. MRI L spine with mild multifocatorial spinal stenosis L4-5, mild L1 and L2 compression fractures, widespread osseous metastasis. MRI T spine innumerable osseous metastases throughout thoracic spine c/w hx metastatic prostate cancer, progressive height loss at T2,T3, T4, T5, T8, T9, T10, T11, T12 vertebral bodies, bony retropulsion and/or abnormal convex posterior contour seen at multiple levels, most pronounced T2-T8. Continue IV steroids Neurosurgery did not think he had much chance for meaningful recovery even with surgery Radiation oncology to continue therapy, regimen  modified. Appreciate oncology assistance Appreciate palliative care recs.  Fall Found on ground on 2/10-11 PM - He denies falling, states he eased himself down This was before his weakness on 2/11 am, related.  Edema Echo with EF 60-65%, no RWMA, normal RVSF UA without proteinuria. Continue daily lasix LE [place] without DVT Net negative  Chest pain- (present on admission) Suspect related to metastatic disease.  Do not suspect ACS.  Essential hypertension- (present on admission) Blood pressure intermittently remains elevated due to ongoing pain. Continue metoprolol, hydralazine. Continue Lasix  Prostate cancer metastatic to bone [facility])- (present on admission) He follows with Dr. Lorenso Courier as an outpatient. S/p lupron injection on 04/17/2020, continue q 3 months. Plan for abiraterone 1000 mg PO daily in outpatient setting per oncology. Stop bicalutamide per oncology   DVT prophylaxis: lovenox Code Status: full Family Communication: No family at bedside.  Disposition: SNF once pain is manageable with oral pain meds.  Status is: Inpatient Remains inpatient appropriate because: pending better pain control  Consultants:  Oncology Palliative Rad onc  Procedures:   none  Antimicrobials:  Anti-infectives (From admission, onward)    None       Subjective: Patient was seen and examined at bedside.  Overnight events noted. He reports pain is better controlled.  He is now tolerating radiation therapy with premedications. He is agreeable for skilled nursing facility for rehab.   Objective: Vitals:   05/25/21 2241 05/26/21 0418 05/26/21 0500 05/26/21 0746  BP: (!) 143/69 (!) 170/77  (!) 151/69  Pulse: 74 78    Resp:  18    Temp:  (!) 97.5 F (36.4 C)    TempSrc:  Oral    SpO2:  98%    Weight:   99.8 kg   Height:        Intake/Output Summary (Last 24 hours) at 05/26/2021 1214 Last data filed at 05/26/2021 1140 Gross per 24 hour  Intake 1320 ml  Output 2200 ml  Net -880 ml   Filed Weights   05/24/21 0500 05/25/21 0501 05/26/21 0500  Weight: 99.9 kg 102.8 kg 99.8 kg    Examination:  General: Deconditioned, appears comfortable, not in any distress. Cardiovascular: S1-S2 heard, regular rate and rhythm, no murmur. Lungs: Clear to auscultation bilaterally, no wheezing, no crackles, normal respiratory effort. Abdomen: Soft, non tender, non distended, BS+ Neurological: Alert and oriented x3, moves all 4 extremities. Skin: No rash or lesions. Extremities: Bilateral pitting edema+, no cyanosis, no clubbing.    Data Reviewed: I have personally reviewed following labs and imaging studies  CBC: Recent Labs  Lab 05/20/21 0455 05/21/21 0826 05/22/21 0415 05/23/21 0454 05/24/21 0525  WBC 7.4 7.7 6.6 7.6 7.0  NEUTROABS 5.3  --  5.0  --  5.5  HGB 9.6* 10.3* 9.2* 9.8* 10.0*  HCT 29.4* 30.7* 28.2* 30.0* 31.5*  MCV 93.6 91.9 93.4 93.8 96.0  PLT 143* 165 129* 125* 122*    Basic Metabolic Panel: Recent Labs  Lab 05/20/21 0455 05/21/21 0826 05/22/21 0415 05/23/21 0454 05/24/21 0525  NA 138 138 138 136 138  K 3.6 4.2 4.5 4.5 4.7  CL 104 103 104 104 105  CO2 27 27 27 27 26   GLUCOSE 104* 151* 125* 127* 120*  BUN 46* 47* 50* 47*  41*  CREATININE 0.82 1.13 1.34* 1.10 1.02  CALCIUM 8.9 8.8* 8.7* 8.6* 8.6*  MG 2.3  --  2.3  --  2.4  PHOS 4.9*  --  3.9  --  3.6    GFR: Estimated Creatinine Clearance: 84.3 mL/min (by C-G formula based on SCr of 1.02 mg/dL).  Liver Function Tests: Recent Labs  Lab 05/20/21 0455 05/21/21 0826 05/22/21 0415 05/24/21 0525  AST 21 40 30 37  ALT 19 19 16  80*  ALKPHOS 244* 293* 286* 246*  BILITOT 0.3 0.4 0.2* 0.2*  PROT 5.4* 6.3* 5.7* 5.5*  ALBUMIN 3.4* 3.6 3.2* 2.9*    CBG: No results for input(s): GLUCAP in the last 168 hours.   No results found for this or any previous visit (from the past 240 hour(s)).   Radiology Studies: No results found.  Scheduled Meds:  acetaminophen  1,000 mg Oral TID   celecoxib  200 mg Oral BID   Chlorhexidine Gluconate Cloth  6 each Topical Daily   dexamethasone (DECADRON) injection  4 mg Intravenous Q6H   docusate sodium  100 mg Oral Q12H   furosemide  40 mg Intravenous Daily   gabapentin  300 mg Oral BID   gabapentin  600  mg Oral QHS   hydrALAZINE  50 mg Oral TID   lidocaine  2 patch Transdermal Q24H   methadone  20 mg Oral Q8H   metoprolol tartrate  25 mg Oral BID   pantoprazole  40 mg Oral BID   polyethylene glycol  17 g Oral BID   potassium chloride  20 mEq Oral Daily   senna-docusate  2 tablet Oral BID   Continuous Infusions:   LOS: 24 days    Time spent: over 35 min    Shawna Clamp, MD [facility]   To contact the attending provider between 7A-7P or the covering provider during after hours 7P-7A, please log into the web site [email or web address] and access using universal [place] password for that web site. If you do not have the password, please call the hospital operator.  05/26/2021, 12:14 PM

## 2021-05-26 NOTE — Progress Notes (Signed)
Patient mentioned "God answers our prayers". He said he is hopeful as the doctor is finding a rehab place for him to go. Patient mentioned that he feels grateful .

## 2021-05-26 NOTE — Plan of Care (Signed)

## 2021-05-27 DIAGNOSIS — Z515 Encounter for palliative care: Secondary | ICD-10-CM

## 2021-05-27 MED ORDER — METHADONE HCL 10 MG PO TABS: 20.0000 mg | ORAL_TABLET | Freq: Three times a day (TID) | ORAL | 0 refills | Status: DC

## 2021-05-27 MED ORDER — HYDROMORPHONE HCL 4 MG PO TABS: 4.0000 mg | ORAL_TABLET | ORAL | 0 refills | Status: DC | PRN

## 2021-05-27 NOTE — Progress Notes (Signed)
PROGRESS NOTE    Randall Larsen.  MGQ:[id number] DOB: 1955-05-28 DOA: 05/01/2021  PCP: [name], MD  Chief Complaint  Patient presents with   Chest Pain    Brief Narrative:  Randall Larsen is a 66 y.o. 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  He was evaluated for urgent radiation therapy and started radiation treatment on 05/04/2021.  Palliative care following and assisting with pain management.  He's had difficult and prolonged course with difficult to control pain.  On 2/10-2/11 he was found on the ground (denies falling and says he eased himself to ground).  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  He was seen by neurosurgery who did not recommend any surgical intervention.  Radiation oncology has adjusted his treatment plan.  Discharge currently pending improved pain control.  Anticipated discharge to SNF in 1 to 2 days.    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 Appreciate palliative care assistance with adjustment of medications - PCA d/c'd on 2/9, had pain throughout the night. He is on dexamethasone, methadone, lidocaine patch, APAP scheduled, toradol scheduled, gabapentin scheduled, robaxin with premedication for radiation. Tolerated radiation this week - Continue dilaudid for premedication before radiation Rx. Pain medications switched to p.o. pain meds.  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 MRI L spine with mild multifocatorial spinal stenosis L4-5, mild L1 and L2 compression fractures, widespread osseous metastasis. MRI T spine innumerable osseous metastases throughout thoracic spine c/w hx metastatic prostate cancer, progressive height loss at T2,T3, T4, T5, T8, T9, T10, T11, T12 vertebral bodies, bony retropulsion and/or abnormal convex posterior contour seen at multiple levels, most pronounced T2-T8. Continue IV steroids Neurosurgery did not think he had much chance for meaningful recovery even with surgery Radiation oncology to continue therapy, regimen  modified. Appreciate oncology assistance Appreciate palliative care recs.  Fall Found on ground on 2/10-11 PM - He denies falling, states he eased himself down This was before his weakness on 2/11 am, related.  Edema Echo with EF 60-65%, no RWMA, normal RVSF UA without proteinuria. Continue daily lasix LE [place] without DVT Net negative  Chest pain- (present on admission) Suspect related to metastatic disease.  Do not suspect ACS.  Essential hypertension- (present on admission) Blood pressure intermittently remains elevated due to ongoing pain. Continue metoprolol, hydralazine. Continue Lasix  Prostate cancer metastatic to bone [facility])- (present on admission) He follows with Dr. Lorenso Courier as an outpatient. S/p lupron injection on 04/17/2020, continue q 3 months. Plan for abiraterone 1000 mg PO daily in outpatient setting per oncology. Stop bicalutamide per oncology   DVT prophylaxis: lovenox Code Status: full Family Communication: No family at bedside.  Disposition: SNF once pain is manageable with oral pain meds.  Status is: Inpatient Remains inpatient  appropriate because: pending better pain control   Consultants:   Oncology Palliative Rad onc  Procedures:  none  Antimicrobials:  Anti-infectives (From admission, onward)    None       Subjective: Patient was seen and examined at bedside.  Overnight events noted. He is now tolerating radiation therapy with premedications.  He reports pain is manageable. He is agreeable for skilled nursing facility for rehab.  He states he does not want to go to [facility].   Objective: Vitals:   05/26/21 1733 05/27/21 0439 05/27/21 0500 05/27/21 1030  BP: 139/69 (!) 167/78  (!) 151/79  Pulse: 72 75  70  Resp: 16 15    Temp: 98.1 F (36.7 C) 98.1 F (36.7 C)  98.5 F (36.9 C)  TempSrc: Oral   Oral  SpO2: 98% 98%  98%  Weight:   103 kg   Height:        Intake/Output Summary (Last 24 hours) at 05/27/2021 1150 Last data filed at 05/27/2021 1100 Gross per 24 hour  Intake 840 ml  Output 1425 ml  Net -585 ml   Filed Weights   05/25/21 0501 05/26/21 0500 05/27/21 0500  Weight: 102.8 kg 99.8 kg 103 kg    Examination:  General: Appears comfortable, not in any acute distress, deconditioned. Cardiovascular: S1-S2 heard, regular rate and rhythm, no murmur. Lungs: CTA bilaterally, no wheezing, no crackles, normal respiratory effort. Abdomen: Soft, non tender, non distended, BS+ Neurological: Alert and oriented x3, moves all 4 extremities. Skin: No rash or lesions. Extremities: Bilateral pitting edema+, no cyanosis, no clubbing.    Data Reviewed: I have personally reviewed following labs and imaging studies  CBC: Recent Labs  Lab 05/21/21 0826 05/22/21 0415 05/23/21 0454 05/24/21 0525  WBC 7.7 6.6 7.6 7.0  NEUTROABS  --  5.0  --  5.5  HGB 10.3* 9.2* 9.8* 10.0*  HCT 30.7* 28.2* 30.0* 31.5*  MCV 91.9 93.4 93.8 96.0  PLT 165 129* 125* 122*    Basic Metabolic Panel: Recent Labs  Lab 05/21/21 0826 05/22/21 0415 05/23/21 0454 05/24/21 0525  NA 138 138 136 138  K 4.2 4.5 4.5 4.7  CL 103 104 104 105  CO2 27 27 27 26   GLUCOSE  151* 125* 127* 120*  BUN 47* 50* 47* 41*  CREATININE 1.13 1.34* 1.10 1.02  CALCIUM 8.8* 8.7* 8.6* 8.6*  MG  --  2.3  --  2.4  PHOS  --  3.9  --  3.6    GFR: Estimated Creatinine Clearance: 85.6 mL/min (by C-G formula based on SCr of 1.02 mg/dL).  Liver Function Tests: Recent Labs  Lab 05/21/21 0826 05/22/21 0415 05/24/21 0525  AST 40 30 37  ALT 19 16 80*  ALKPHOS 293* 286* 246*  BILITOT 0.4 0.2* 0.2*  PROT 6.3* 5.7* 5.5*  ALBUMIN 3.6 3.2* 2.9*    CBG: No results for input(s): GLUCAP in the last 168 hours.   No results found for this or any previous visit (from the past 240 hour(s)).   Radiology Studies: No results found.  Scheduled Meds:  acetaminophen  1,000 mg Oral TID   celecoxib  200 mg Oral BID   Chlorhexidine Gluconate Cloth  6 each Topical Daily   dexamethasone (DECADRON) injection  4 mg Intravenous Q6H   docusate sodium  100 mg Oral Q12H   furosemide  40 mg Intravenous Daily   gabapentin  300 mg Oral BID   gabapentin  600 mg Oral QHS   hydrALAZINE  50 mg Oral TID   lidocaine  2 patch Transdermal Q24H   methadone  20 mg Oral Q8H   metoprolol tartrate  25 mg Oral BID   pantoprazole  40 mg Oral BID   polyethylene glycol  17 g Oral BID   potassium chloride  20 mEq Oral Daily   senna-docusate  2 tablet Oral BID   Continuous Infusions:   LOS: 25 days    Time spent: over 35 min    Shawna Clamp, MD [facility]   To contact the attending provider between 7A-7P or the covering provider during after hours 7P-7A, please log into the web site [email or web address] and access using universal [place] password for that web site. If you do not have the password, please call the hospital operator.  05/27/2021, 11:50 AM

## 2021-05-27 NOTE — TOC Progression Note (Addendum)
Transition of Care (TOC) - Progression Note    Patient Details  Name: Randall Larsen. MRN: [id number] Date of Birth: 17-Sep-1955  [facility]) CM/SW Contact  Ross Ludwig, [place] Phone Number: 05/27/2021, 1:06 PM  Clinical Narrative:     CSW spoke to [place] at [facility]), and she said they can not accept patient until Monday due to the amount of pain medications he is on.  SNF said they will not be able to get the meds over the weekend.  CSW updated attending physician.  CSW explained to patient that he has been approved for [facility] from his insurance and that is the facility that he had chosen. Snf can not be changed since authorization has been received.  Expected Discharge Plan: Skilled Nursing Facility Barriers to Discharge: Insurance Authorization  Expected Discharge Plan and Services Expected Discharge Plan: [place]   Discharge Planning Services: CM Consult   Living arrangements for the past 2 months: Single Family Home                                       Social Determinants of Health (SDOH) Interventions    Readmission Risk Interventions No flowsheet data found.

## 2021-05-27 NOTE — Progress Notes (Signed)
PMT no charge note  Corresponded with TRH MD as well as my colleague in [place] NP this morning about the patient's potential discharge to SNF and continuing his opioids.  Script provided for PO Methadone as well as PO Dilaudid PRN.  No charge Loistine Chance MD [place] palliative.

## 2021-05-28 NOTE — Progress Notes (Signed)
PROGRESS NOTE    Randall Larsen.  DQQ:[id number] DOB: [birth year] DOA: 05/01/2021  PCP: [name], MD  Chief Complaint  Patient presents with   Chest Pain    Brief Narrative:  [name] is a 66 y.o. 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  He was evaluated for urgent radiation therapy and started radiation treatment on 05/04/2021.  Palliative care following and assisting with pain management.  He's had difficult and prolonged course with difficult to control pain.  On 2/10-2/11 he was found on the ground (denies falling and says he eased himself to ground).  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  He was seen by neurosurgery who did not recommend any surgical intervention.  Radiation oncology has adjusted his treatment plan.  Discharge currently pending improved pain control.  Anticipated discharge to SNF in 1 to 2 days.  Patient wants to explore other nursing home options.    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 Appreciate palliative care assistance with adjustment of pain medications - PCA d/c'd on 2/9, had pain throughout the night. He is on dexamethasone, methadone, lidocaine patch, APAP scheduled, toradol scheduled, gabapentin scheduled, robaxin with premedication for radiation. Tolerated radiation this week - Continue dilaudid for premedication before radiation  Rx. 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 MRI L spine with mild multifocatorial spinal stenosis L4-5, mild L1 and L2 compression fractures, widespread osseous metastasis. MRI T spine innumerable osseous metastases throughout thoracic spine c/w hx metastatic prostate cancer, progressive height loss at T2,T3, T4, T5, T8, T9, T10, T11, T12 vertebral bodies, bony retropulsion and/or abnormal convex posterior contour seen at multiple levels, most pronounced T2-T8. Continue IV steroids Neurosurgery did not think he had much chance for meaningful recovery even with surgery. Radiation oncology to continue therapy, regimen  modified. Appreciate oncology assistance Appreciate palliative care recs.  Fall Found on ground on 2/10-11 PM - He denies falling, states he eased himself down This was before his weakness on 2/11 am, related.  Edema Echo with EF 60-65%, no RWMA, normal RVSF UA without proteinuria. Continue daily lasix LE [place] without DVT Net negative  Chest pain- (present on admission) Suspect related to metastatic disease.  Do not suspect ACS.  Essential hypertension- (present on admission) Blood pressure intermittently remains elevated due to ongoing pain. Continue metoprolol, hydralazine. Continue Lasix  Prostate cancer metastatic to bone [facility])- (present on admission) He follows with Dr. Lorenso Courier as an outpatient. S/p lupron injection on 04/17/2020, continue q 3 months. Plan for abiraterone 1000 mg PO daily in outpatient setting per oncology. Stop bicalutamide per oncology   DVT prophylaxis: lovenox Code Status: full Family Communication: No family at bedside.  Disposition: SNF once pain is manageable  with oral pain meds.  Status is: Inpatient Remains inpatient appropriate  because: pending better pain control   Consultants:  Oncology Palliative Rad onc  Procedures:  none  Antimicrobials:  Anti-infectives (From admission, onward)    None       Subjective: Patient was seen and examined at bedside.  Overnight events noted. He is now tolerating radiation therapy with premedication with Dilaudid. He is agreeable for skilled nursing facility for rehab.  He states he does not want to go to [facility].  Notified case management.   Objective: Vitals:   05/27/21 2050 05/28/21 0436 05/28/21 0438 05/28/21 1005  BP: (!) 165/92 (!) 151/76  (!) 158/80  Pulse: 71 69  73  Resp: 20 18    Temp: 97.6 F (36.4 C) 97.6 F (36.4 C)    TempSrc: Oral     SpO2: 97% 98%    Weight:   99.7 kg   Height:        Intake/Output Summary (Last 24 hours) at 05/28/2021 1148 Last data filed at 05/28/2021 1146 Gross per 24 hour  Intake 1560 ml  Output 3175 ml  Net -1615 ml   Filed Weights   05/26/21 0500 05/27/21 0500 05/28/21 0438  Weight: 99.8 kg 103 kg 99.7 kg    Examination:  General: Appears comfortable, deconditioned, not in any distress. Cardiovascular: S1-S2 heard, regular rate and rhythm, no murmur. Lungs: CTA bilaterally, no wheezing, no crackles, normal respiratory effort. Abdomen: Soft, non tender, non distended, BS+ Neurological: Alert and oriented x3, moves all 4 extremities. Skin: No rash or lesions. Extremities: Bilateral pitting edema+, no cyanosis, no clubbing.    Data Reviewed: I have personally reviewed following labs and imaging studies  CBC: Recent Labs  Lab 05/22/21 0415 05/23/21 0454 05/24/21 0525  WBC 6.6 7.6 7.0  NEUTROABS 5.0  --  5.5  HGB 9.2* 9.8* 10.0*  HCT 28.2* 30.0* 31.5*  MCV 93.4 93.8 96.0  PLT 129* 125* 122*    Basic Metabolic Panel: Recent Labs  Lab 05/22/21 0415 05/23/21 0454 05/24/21 0525  NA 138 136 138  K 4.5 4.5 4.7  CL 104 104 105  CO2 27 27 26   GLUCOSE 125* 127* 120*  BUN 50* 47* 41*   CREATININE 1.34* 1.10 1.02  CALCIUM 8.7* 8.6* 8.6*  MG 2.3  --  2.4  PHOS 3.9  --  3.6    GFR: Estimated Creatinine Clearance: 84.3 mL/min (by C-G formula based on SCr of 1.02 mg/dL).  Liver Function Tests: Recent Labs  Lab 05/22/21 0415 05/24/21 0525  AST 30 37  ALT 16 80*  ALKPHOS 286* 246*  BILITOT 0.2* 0.2*  PROT 5.7* 5.5*  ALBUMIN 3.2* 2.9*    CBG: No results for input(s): GLUCAP in the last 168 hours.   No results found for this or any previous visit (from the past 240 hour(s)).   Radiology Studies: No results found.  Scheduled Meds:  acetaminophen  1,000 mg Oral TID   celecoxib  200 mg Oral BID   Chlorhexidine Gluconate Cloth  6 each Topical Daily   dexamethasone (DECADRON) injection  4 mg Intravenous Q6H   docusate sodium  100 mg Oral Q12H   furosemide  40 mg Intravenous Daily   gabapentin  300 mg Oral BID   gabapentin  600 mg Oral QHS   hydrALAZINE  50 mg Oral TID   lidocaine  2 patch Transdermal Q24H   methadone  20 mg Oral Q8H   metoprolol tartrate  25  mg Oral BID   pantoprazole  40 mg Oral BID   polyethylene glycol  17 g Oral BID   potassium chloride  20 mEq Oral Daily   senna-docusate  2 tablet Oral BID   Continuous Infusions:   LOS: 26 days    Time spent: over 6 min    Shawna Clamp, MD [facility]   To contact the attending provider between 7A-7P or the covering provider during after hours 7P-7A, please log into the web site [email or web address] and access using universal [place] password for that web site. If you do not have the password, please call the hospital operator.  05/28/2021, 11:48 AM

## 2021-05-28 NOTE — Plan of Care (Signed)
  Problem: Elimination: Goal: Will not experience complications related to bowel motility Outcome: Progressing   

## 2021-05-29 ENCOUNTER — Ambulatory Visit: Payer: Medicare HMO

## 2021-05-29 DIAGNOSIS — R600 Localized edema: Secondary | ICD-10-CM | POA: Diagnosis not present

## 2021-05-29 DIAGNOSIS — R52 Pain, unspecified: Secondary | ICD-10-CM | POA: Diagnosis not present

## 2021-05-29 DIAGNOSIS — C7951 Secondary malignant neoplasm of bone: Secondary | ICD-10-CM | POA: Diagnosis not present

## 2021-05-29 DIAGNOSIS — G893 Neoplasm related pain (acute) (chronic): Secondary | ICD-10-CM | POA: Diagnosis not present

## 2021-05-29 LAB — CBC
HCT: 31.6 % — ABNORMAL LOW (ref 39.0–52.0)
Hemoglobin: 10.2 g/dL — ABNORMAL LOW (ref 13.0–17.0)
MCH: 30.9 pg (ref 26.0–34.0)
MCHC: 32.3 g/dL (ref 30.0–36.0)
MCV: 95.8 fL (ref 80.0–100.0)
Platelets: 134 10*3/uL — ABNORMAL LOW (ref 150–400)
RBC: 3.3 MIL/uL — ABNORMAL LOW (ref 4.22–5.81)
RDW: 20.1 % — ABNORMAL HIGH (ref 11.5–15.5)
WBC: 7.9 10*3/uL (ref 4.0–10.5)
nRBC: 5.3 % — ABNORMAL HIGH (ref 0.0–0.2)

## 2021-05-29 LAB — COMPREHENSIVE METABOLIC PANEL
ALT: 58 U/L — ABNORMAL HIGH (ref 0–44)
AST: 55 U/L — ABNORMAL HIGH (ref 15–41)
Albumin: 3.1 g/dL — ABNORMAL LOW (ref 3.5–5.0)
Alkaline Phosphatase: 398 U/L — ABNORMAL HIGH (ref 38–126)
Anion gap: 9 (ref 5–15)
BUN: 55 mg/dL — ABNORMAL HIGH (ref 8–23)
CO2: 23 mmol/L (ref 22–32)
Calcium: 8.4 mg/dL — ABNORMAL LOW (ref 8.9–10.3)
Chloride: 102 mmol/L (ref 98–111)
Creatinine, Ser: 1.07 mg/dL (ref 0.61–1.24)
GFR, Estimated: >60 mL/min (ref >60)
Glucose, Bld: 154 mg/dL — ABNORMAL HIGH (ref 70–99)
Potassium: 4.5 mmol/L (ref 3.5–5.1)
Sodium: 134 mmol/L — ABNORMAL LOW (ref 135–145)
Total Bilirubin: 0.5 mg/dL (ref 0.3–1.2)
Total Protein: 5.4 g/dL — ABNORMAL LOW (ref 6.5–8.1)

## 2021-05-29 LAB — PHOSPHORUS: Phosphorus: 3.9 mg/dL (ref 2.5–4.6)

## 2021-05-29 LAB — MAGNESIUM: Magnesium: 2.6 mg/dL — ABNORMAL HIGH (ref 1.7–2.4)

## 2021-05-29 MED ORDER — GUAIFENESIN ER 600 MG PO TB12
600.0000 mg | ORAL_TABLET | Freq: Two times a day (BID) | ORAL | Status: DC
  Administered 2021-05-29 – 2021-05-30 (×4): 600 mg via ORAL
  Filled 2021-05-29 (×4): qty 1

## 2021-05-29 NOTE — Progress Notes (Signed)
PROGRESS NOTE    Randall Larsen.  GBT:[id number] DOB: 1956/02/08 DOA: 05/01/2021  PCP: [name], MD  Chief Complaint  Patient presents with   Chest Pain    Brief Narrative:  Randall Larsen is a 66 y.o. 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  He was evaluated for urgent radiation therapy and started radiation treatment on 05/04/2021.  Palliative care following and assisting with pain management.  He's had difficult and prolonged course with difficult to control pain.  On 2/10-2/11 he was found on the ground (denies falling and says he eased himself to ground).  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  He was seen by neurosurgery who did not recommend any surgical intervention.  Radiation oncology has adjusted his treatment plan.  Insurance authorization approved for [facility] for rehabilitation.  Anticipated discharge to SNF tomorrow 2/21.    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 Appreciate palliative care assistance with adjustment of pain medications - PCA d/c'd on 2/9, had pain throughout the night. He is on dexamethasone, methadone, lidocaine patch, APAP scheduled, toradol scheduled, gabapentin scheduled, robaxin with premedication for radiation. Tolerated radiation this week - Continue dilaudid for premedication before radiation Rx. Pain medications switched to PO  pain medications. He will be discharged on Dilaudid p.o. and methadone p.o. tomorrow.  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 MRI L spine with mild multifocatorial spinal stenosis L4-5, mild L1 and L2 compression fractures, widespread osseous metastasis. MRI T spine innumerable osseous metastases throughout thoracic spine c/w hx metastatic prostate cancer, progressive height loss at T2,T3, T4, T5, T8, T9, T10, T11, T12 vertebral bodies, bony retropulsion and/or abnormal convex posterior contour seen at multiple levels, most pronounced T2-T8. Continue IV steroids Neurosurgery did not think he had much chance for meaningful recovery even with surgery. Radiation oncology to continue therapy, regimen  modified. Appreciate oncology assistance Appreciate palliative care recs.  Fall Found on ground on 2/10-11 PM - He denies falling, states he eased himself down This was before his weakness on 2/11 am, related.  Edema Echo with EF 60-65%, no RWMA, normal RVSF UA without proteinuria. Continue daily lasix LE [place] without DVT Net negative  Chest pain- (present on admission) Suspect related to metastatic disease.  Do not suspect ACS.  Essential hypertension- (present on admission) Blood pressure intermittently remains elevated due to ongoing pain. Continue metoprolol, hydralazine. Continue Lasix  Prostate cancer metastatic to bone [facility])- (present on admission) He follows with Dr. Lorenso Courier as an outpatient. S/p lupron injection on 04/17/2020, continue q 3 months. Plan for abiraterone 1000 mg PO daily in outpatient setting per oncology. Stop bicalutamide per oncology   DVT prophylaxis: lovenox Code Status: full Family Communication: No family at bedside.  Disposition: SNF once pain  is manageable with oral pain meds.  Status is:  Inpatient Remains inpatient appropriate because: pending better pain control   Consultants:  Oncology Palliative Rad onc  Procedures:  none  Antimicrobials:  Anti-infectives (From admission, onward)    None       Subjective: Patient was seen and examined at bedside.  Overnight events noted. He is now tolerating radiation therapy with premedication with Dilaudid. Patient want to be discharged to SNF tomorrow, stated he has to get his things in order with family   Objective: Vitals:   05/29/21 0500 05/29/21 0522 05/29/21 0528 05/29/21 1323  BP:  (!) 170/85 (!) 168/80 (!) 143/85  Pulse:  71  73  Resp:  18  18  Temp:  98.6 F (37 C)  (!) 97.5 F (36.4 C)  TempSrc:  Oral  Oral  SpO2:  98%  100%  Weight: 101.9 kg     Height:        Intake/Output Summary (Last 24 hours) at 05/29/2021 1341 Last data filed at 05/29/2021 1315 Gross per 24 hour  Intake 720 ml  Output 2850 ml  Net -2130 ml   Filed Weights   05/27/21 0500 05/28/21 0438 05/29/21 0500  Weight: 103 kg 99.7 kg 101.9 kg    Examination:  General: Appears comfortable, deconditioned, not in any distress. Cardiovascular: S1-S2 heard, regular rate and rhythm, no murmur. Lungs: CTA bilaterally, no wheezing, no crackles, normal respiratory effort. Abdomen: Soft, non tender, non distended, BS+ Neurological: Alert and oriented x3, moves all 4 extremities. Skin: No rash or lesions. Extremities: Bilateral pitting edema+, no cyanosis, no clubbing.    Data Reviewed: I have personally reviewed following labs and imaging studies  CBC: Recent Labs  Lab 05/23/21 0454 05/24/21 0525 05/29/21 0441  WBC 7.6 7.0 7.9  NEUTROABS  --  5.5  --   HGB 9.8* 10.0* 10.2*  HCT 30.0* 31.5* 31.6*  MCV 93.8 96.0 95.8  PLT 125* 122* 134*    Basic Metabolic Panel: Recent Labs  Lab 05/23/21 0454 05/24/21 0525 05/29/21 0441  NA 136 138 134*  K 4.5 4.7 4.5  CL 104 105 102  CO2 27 26 23   GLUCOSE 127* 120* 154*  BUN 47*  41* 55*  CREATININE 1.10 1.02 1.07  CALCIUM 8.6* 8.6* 8.4*  MG  --  2.4 2.6*  PHOS  --  3.6 3.9    GFR: Estimated Creatinine Clearance: 81.3 mL/min (by C-G formula based on SCr of 1.07 mg/dL).  Liver Function Tests: Recent Labs  Lab 05/24/21 0525 05/29/21 0441  AST 37 55*  ALT 80* 58*  ALKPHOS 246* 398*  BILITOT 0.2* 0.5  PROT 5.5* 5.4*  ALBUMIN 2.9* 3.1*    CBG: No results for input(s): GLUCAP in the last 168 hours.   No results found for this or any previous visit (from the past 240 hour(s)).   Radiology Studies: No results found.  Scheduled Meds:  acetaminophen  1,000 mg Oral TID   celecoxib  200 mg Oral BID   Chlorhexidine Gluconate Cloth  6 each Topical Daily   dexamethasone (DECADRON) injection  4 mg Intravenous Q6H   docusate sodium  100 mg Oral Q12H   furosemide  40 mg Intravenous Daily   gabapentin  300 mg Oral BID   gabapentin  600 mg Oral QHS   guaiFENesin  600 mg Oral BID   hydrALAZINE  50 mg Oral TID   lidocaine  2 patch Transdermal Q24H   methadone  20 mg Oral  Q8H   metoprolol tartrate  25 mg Oral BID   pantoprazole  40 mg Oral BID   polyethylene glycol  17 g Oral BID   potassium chloride  20 mEq Oral Daily   senna-docusate  2 tablet Oral BID   Continuous Infusions:   LOS: 27 days    Time spent: over 78 min    Shawna Clamp, MD [facility]   To contact the attending provider between 7A-7P or the covering provider during after hours 7P-7A, please log into the web site [email or web address] and access using universal [place] password for that web site. If you do not have the password, please call the hospital operator.  05/29/2021, 1:41 PM

## 2021-05-29 NOTE — Progress Notes (Signed)
° ° ° °  [name] is sitting up in bed watching television. Appears comfortable. Shares he had a restful night.   He was scheduled to discharge to [facility] rehab on Saturday and unfortunately on the day of discharge expressed wishes not to go to facility. I discussed with patient reasoning behind declining to transport to facility. He shares he is not familiar with the facility, has no information on the facility, does not know what it looks like, and is not comfortable going. I advised there has been discussions and information provided on facility. During my follow-up on Friday he expressed readiness to go. I personally pulled up facility on last week and showed him pictures of the facility per his request. He verbalized his recall.   Patient has consistently expressed appreciation of the care he is receiving while hospitalized and concerns that he will not receive the care and attention he has grown use to over the past month since hospitalized. Education provided on the goal of rehab is to allow and encourage him to gain improvement if possible and become more independent in order to return home or to location outside of rehab. He verbalized understanding.   Patient's has been well controlled on current regimen. Understands regimen of oral medications will be continued at discharge which he is appreciative of.   Palliative will continue to support and follow as needed. No further palliative or symptom needs. Hopeful patient will discharge as planned.   Plan -Discharge to Rehab -Methadone, hydromorphone, celebrex, ativan, and gabapentin at discharge. My colleague Dr. Rowe Larsen has provided necessary scripts on Saturday for Methadone and Dilaudid.  -Patient aware and medical team aware patient will not receive IV medications at discharge. This is not offered or needed at rehab. Pain is well controlled on oral regimen. No delay in discharge expected.  -Please call palliative with urgent needs.    Time Total: 25 min.   Visit consisted of counseling and education dealing with the complex and emotionally intense issues of symptom management and palliative care in the setting of serious and potentially life-threatening illness.Greater than 50%  of this time was spent counseling and coordinating care related to the above assessment and plan.  Alda Lea, AGPCNP-BC Palliative Medicine Team  Phone: [phone] Amion: Randall Larsen

## 2021-05-29 NOTE — Progress Notes (Signed)
PT Cancellation Note  Patient Details Name: Randall Larsen. MRN: [id number] DOB: 07/01/1955   Cancelled Treatment:    Reason Eval/Treat Not Completed: Pain limiting ability to participate (pt declined PT, stated he's in too much pain at present.)   Philomena Doheny PT 05/29/2021  Acute Rehabilitation Services Pager [phone] Office [phone]

## 2021-05-29 NOTE — TOC Transition Note (Signed)
Transition of Care [facility]) - CM/SW Discharge Note   Patient Details  Name: Elijahjames Fuelling. MRN: [id number] Date of Birth: 12/30/1955  Transition of Care [facility]) CM/SW Contact:  Dessa Phi, RN Phone Number: 05/29/2021, 12:12 PM   Clinical Narrative:   B and E visibly made a visit to see him in hospital, & has spoken to him on the phone-he says no spoke to him-he is getting a Chief Executive Officer. Since now he wants to change facility @ d/c day that has not been brought up prior to now. also reminder no iv dilaudid @ SNF. Please asst w/discharge today since this is the 2nd auth.    Final next level of care: Skilled Nursing Facility Barriers to Discharge: Insurance Authorization   Patient Goals and CMS Choice Patient states their goals for this hospitalization and ongoing recovery are:: home CMS Medicare.gov Compare Post Acute Care list provided to:: Patient Choice offered to / list presented to : Patient  Discharge Placement                       Discharge Plan and Services   Discharge Planning Services: CM Consult                                 Social Determinants of Health (SDOH) Interventions     Readmission Risk Interventions No flowsheet data found.

## 2021-05-30 ENCOUNTER — Other Ambulatory Visit: Payer: Self-pay | Admitting: Nurse Practitioner

## 2021-05-30 MED ORDER — ACETAMINOPHEN 500 MG PO TABS: 1000.0000 mg | ORAL_TABLET | Freq: Three times a day (TID) | ORAL | 0 refills | Status: DC

## 2021-05-30 MED ORDER — CELECOXIB 200 MG PO CAPS: 200.0000 mg | ORAL_CAPSULE | Freq: Two times a day (BID) | ORAL | 1 refills | Status: DC

## 2021-05-30 MED ORDER — HYDROMORPHONE HCL 4 MG PO TABS: 4.0000 mg | ORAL_TABLET | ORAL | 0 refills | Status: DC | PRN

## 2021-05-30 MED ORDER — PANTOPRAZOLE SODIUM 40 MG PO TBEC: 40.0000 mg | DELAYED_RELEASE_TABLET | Freq: Two times a day (BID) | ORAL | 0 refills | Status: DC

## 2021-05-30 MED ORDER — METHADONE HCL 10 MG PO TABS: 20.0000 mg | ORAL_TABLET | Freq: Three times a day (TID) | ORAL | 0 refills | Status: DC

## 2021-05-30 MED ORDER — GABAPENTIN 300 MG PO CAPS: 300.0000 mg | ORAL_CAPSULE | Freq: Two times a day (BID) | ORAL | 0 refills | Status: DC

## 2021-05-30 MED ORDER — FUROSEMIDE 40 MG PO TABS: 40.0000 mg | ORAL_TABLET | Freq: Every day | ORAL | 1 refills | Status: DC

## 2021-05-30 MED ORDER — METHOCARBAMOL 500 MG PO TABS: 500.0000 mg | ORAL_TABLET | Freq: Three times a day (TID) | ORAL | 1 refills | Status: DC | PRN

## 2021-05-30 MED ORDER — METOPROLOL TARTRATE 25 MG PO TABS: 25.0000 mg | ORAL_TABLET | Freq: Two times a day (BID) | ORAL | 1 refills | Status: DC

## 2021-05-30 MED ORDER — LORAZEPAM 0.5 MG PO TABS: 0.5000 mg | ORAL_TABLET | Freq: Two times a day (BID) | ORAL | 0 refills | Status: AC | PRN

## 2021-05-30 NOTE — Discharge Summary (Signed)
Physician Discharge Summary   Patient: Randall Larsen. MRN: [id number] DOB: 04-Oct-1955  Admit date:     05/01/2021  Discharge date: 05/30/21  Discharge Physician: Shawna Clamp   PCP: [name], MD   Recommendations at discharge:  Advised to follow-up with primary care physician in 1 week. Advised to follow-up with oncologist as per schedule. Patient is being discharged to skilled nursing facility for rehab, Discharged  on methadone, Dilaudid, gabapentin, Celebrex,.  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 [name] Course: Randall Larsen is a 66 y.o. M with hx. Prostate CA metastatic to anterior ribs, left iliac, scapulae bilateral, pubic rami and bilateral proximal femurs, who presented to the ED with worsening Chest Pain related to his metastatic disease. He was evaluated for urgent radiation therapy and started radiation treatment on 05/04/2021.  Palliative care consulted and assisted with pain management.  He had difficult and prolonged course with difficult to control pain. He was started on Dilaudid PCA and continued to remain on PCA.  Multiple pain regimens were attempted.  On 2/10-2/11 he was found on the ground (denies falling and says he eased himself to ground).  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  He was seen by neurosurgery who did not recommend any surgical intervention.  Radiation oncology has adjusted his treatment plan.  Finally pain has improved and he was successfully transitioned to oral pain medications.  Insurance authorization approved for [facility] for  rehabilitation. Patient is being discharged on oral pain medications.    Assessment and Plan: * Cancer associated pain- (present on admission) Patient is dealing with difficult to control pain. Appreciate palliative care assistance with adjustment of pain medications - PCA d/c'd on 2/9, had pain throughout the night. He is on dexamethasone, methadone, lidocaine patch, APAP scheduled, toradol scheduled, gabapentin scheduled, robaxin with premedication for radiation. Tolerated radiation this week - Continue dilaudid for premedication before radiation Rx. Pain medications switched to PO pain medications. Pain is well controlled on oral pain medications.  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 MRI L spine with mild multifocatorial spinal stenosis L4-5, mild L1 and L2 compression fractures, widespread osseous metastasis. MRI T spine innumerable osseous metastases throughout thoracic spine c/w hx metastatic prostate cancer, progressive height loss at T2,T3, T4, T5, T8, T9, T10, T11, T12 vertebral bodies, bony retropulsion and/or abnormal convex posterior contour seen at multiple levels, most pronounced T2-T8. Continued IV steroids Neurosurgery did not think he had much chance for meaningful recovery even with surgery. Radiation oncology to continue therapy, regimen  modified. Appreciate oncology assistance Appreciate palliative care recs.  Fall Found on ground on 2/10-11 PM - He denies falling, states he eased himself down This was before his weakness on 2/11 am, related.  Edema Echo with EF 60-65%, no RWMA, normal RVSF UA without proteinuria. Continue daily lasix LE [place] without DVT Net negative  Chest pain- (present on admission) Suspect related to metastatic disease.  Do not suspect ACS.  Essential hypertension- (present on  admission) Blood pressure intermittently remains elevated due to ongoing pain. Continue metoprolol, hydralazine. Continue Lasix  Prostate cancer metastatic to bone [facility])- (present on admission) He follows with Dr. Lorenso Courier as an outpatient. S/p lupron injection on 04/17/2020, continue q 3 months. Plan for abiraterone 1000 mg PO daily in outpatient setting per oncology. Stop bicalutamide per oncology    Pain control - [facility] Controlled Substance Reporting System database was reviewed. and patient was instructed, not to drive, operate heavy machinery, perform activities at heights, swimming or participation in water activities or provide baby-sitting services while on Pain, Sleep and Anxiety Medications; until their outpatient Physician has advised to do so again. Also recommended to not to take more than prescribed Pain, Sleep and Anxiety Medications.   Consultants: Palliative care, neurosurgery  Procedures performed: MRI C-spine T-spine L-spine. Disposition: Skilled nursing facility Diet recommendation:  Discharge Diet Orders (From admission, onward)     Start     Ordered   05/30/21 0000  Diet - low sodium heart healthy        05/30/21 1051   05/30/21 0000  Diet Carb Modified        05/30/21 1051           Cardiac and Carb modified diet  DISCHARGE MEDICATION: Allergies as of 05/30/2021   No Known Allergies      Medication List     STOP taking these medications    amLODipine 10 MG tablet Commonly known as: NORVASC   bicalutamide 50 MG tablet Commonly known as: CASODEX   ibuprofen 200 MG tablet Commonly known as: ADVIL   metoprolol succinate 100 MG 24 hr tablet Commonly known as: TOPROL-XL   oxyCODONE 5 MG immediate release tablet Commonly known as: Oxy IR/ROXICODONE   Xtampza ER 9 MG C12a Generic drug: oxyCODONE ER       TAKE these medications    acetaminophen 500 MG tablet Commonly known as: TYLENOL Take 2 tablets (1,000 mg total) by mouth 3  (three) times daily.   celecoxib 200 MG capsule Commonly known as: CELEBREX Take 1 capsule (200 mg total) by mouth 2 (two) times daily.   docusate sodium 100 MG capsule Commonly known as: COLACE Take 1 capsule (100 mg total) by mouth every 12 (twelve) hours.   furosemide 40 MG tablet Commonly known as: Lasix Take 1 tablet (40 mg total) by mouth daily.   gabapentin 300 MG capsule Commonly known as: NEURONTIN Take 1 capsule (300 mg total) by mouth 2 (two) times daily.   hydrALAZINE 50 MG tablet Commonly known as: APRESOLINE Take 1 tablet (50 mg total) by mouth 3 (three) times daily.   HYDROmorphone 4 MG tablet Commonly known as: DILAUDID Take 1-1.5 tablets (4-6 mg total) by mouth every 3 (three) hours as needed for severe pain or moderate pain (for breakthrough pain).   lidocaine 5 % Commonly known as: LIDODERM Place 1 patch onto the skin daily. Remove & Discard patch within 12 hours or as directed by MD   LORazepam 0.5 MG tablet Commonly known as: ATIVAN Take 1 tablet (0.5 mg total) by mouth 2 (two) times daily as needed for anxiety.   methadone 10 MG tablet Commonly known as: DOLOPHINE Take 2 tablets (20 mg total) by mouth every 8 (eight) hours.   methocarbamol 500 MG tablet Commonly known as: ROBAXIN Take 1 tablet (500 mg total) by mouth 3 (three) times daily as needed for muscle spasms.   metoprolol tartrate 25 MG tablet  Commonly known as: LOPRESSOR Take 1 tablet (25 mg total) by mouth 2 (two) times daily.   multivitamin with minerals Tabs tablet Take 1 tablet by mouth daily.   ondansetron 4 MG tablet Commonly known as: Zofran Take 1 tablet (4 mg total) by mouth every 8 (eight) hours as needed for nausea or vomiting.   pantoprazole 40 MG tablet Commonly known as: PROTONIX Take 1 tablet (40 mg total) by mouth 2 (two) times daily.   polyethylene glycol 17 g packet Commonly known as: MIRALAX / GLYCOLAX Take 17 g by mouth 2 (two) times daily.         Contact information for follow-up providers     [name], MD Follow up in 1 week(s).   Specialty: Family Medicine Contact information: [place] [address] [phone]         Orson Slick, MD Follow up in 1 week(s).   Specialty: Hematology and Oncology Contact information: [place] [address] [phone]              Contact information for after-discharge care     [facility] .   Service: Skilled Nursing Contact information: [address] [place] [phone]                     Discharge Exam: Danley Danker Weights   05/28/21 0438 05/29/21 0500 05/30/21 0516  Weight: 99.7 kg 101.9 kg 103.1 kg   General exam: Appears comfortable, not in any acute distress.  Deconditioned. Respiratory system: Clear to auscultation bilaterally, no wheezing, no crackles. Cardiovascular system: S1-S2 heard, no murmur, regular rate and rhythm. Gastrointestinal system: .  Soft, nontender, nondistended, BS+ Central nervous system: Alert, oriented x3. Extremities: Moves all 4 extremities.  Edema+, no cyanosis, no clubbing Psychiatry: Mood and judgment normal   Condition at discharge: stable  The results of significant diagnostics from this hospitalization (including imaging, microbiology, ancillary and laboratory) are listed below for reference.   Imaging Studies: CT Angio Chest PE W and/or Wo Contrast  Result Date: 05/01/2021 CLINICAL DATA:  Chest pain and elevated D-dimer. Prostate cancer with bone metastases. EXAM: CT ANGIOGRAPHY CHEST WITH CONTRAST TECHNIQUE: Multidetector CT imaging of the chest was performed using the standard protocol during bolus administration of intravenous contrast. Multiplanar CT image reconstructions and MIPs were obtained to evaluate the vascular anatomy. RADIATION DOSE REDUCTION: This exam was  performed according to the departmental dose-optimization program which includes automated exposure control, adjustment of the mA and/or kV according to patient size and/or use of iterative reconstruction technique. CONTRAST:  45mL OMNIPAQUE IOHEXOL 350 MG/ML SOLN COMPARISON:  02/08/2021 FINDINGS: Cardiovascular: Marked motion artifacts in the lower lung zones, significantly worse than on the previous examination. This produces simulation of multiple bilateral pulmonary artery filling defects due to misregistration artifacts. No central or upper lobe pulmonary arterial filling defects are seen. The heart remains enlarged with a pericardial effusion again demonstrated, measuring 1.4 cm in thickness. Atheromatous calcifications, including the coronary arteries and aorta. Mediastinum/Nodes: No enlarged mediastinal, hilar, or axillary lymph nodes. Thyroid gland, trachea, and esophagus demonstrate no significant findings. Lungs/Pleura: Extensive motion artifacts in the lower lung zones. No gross pulmonary nodules. The previously described 6 mm right middle lobe nodule is no longer visualized. Small left pleural effusion. Upper Abdomen: Enlarged lateral segment left lobe liver and caudate lobe with a relatively small right lobe, similar  to the previous examination. Normal sized spleen. Musculoskeletal: Extensive patchy, sclerotic bony lesions with progression. Previously demonstrated multiple lytic areas are also progressive. Interval multiple mild thoracic vertebral compression deformities. Progressive expansile lytic and sclerotic 5th rib lesion with a progressive pleural soft tissue component. Review of the MIP images confirms the above findings. IMPRESSION: 1. Marked breathing motion artifacts in the lower lung zones essentially making it impossible to evaluate for lower zone pulmonary emboli. No pulmonary emboli seen elsewhere in either lung. 2. Interval small left pleural effusion. 3. Significantly progressive bony  metastatic disease with multiple interval mild thoracic vertebral compression deformities. 4. Mild calcific coronary artery and aortic atherosclerosis. Aortic Atherosclerosis (ICD10-I70.0). Electronically Signed   By: Claudie Revering M.D.   On: 05/01/2021 18:38   MR THORACIC SPINE WO CONTRAST  Result Date: 05/20/2021 CLINICAL DATA:  Initial evaluation for acute mid back pain, history of metastatic prostate cancer. EXAM: MRI THORACIC SPINE WITHOUT CONTRAST TECHNIQUE: Multiplanar, multisequence MR imaging of the thoracic spine was performed. No intravenous contrast was administered. COMPARISON:  CT from 02/05/2021. FINDINGS: Alignment:  Examination degraded by motion artifact. Mild dextroscoliosis. Alignment otherwise normal with preservation of the normal thoracic kyphosis. No listhesis. Vertebrae: Innumerable osseous metastases seen throughout the thoracic spine, consistent with history of metastatic prostate cancer. There is involvement of essentially all levels, with diffuse involvement of the visualized ribs. Since previous exam, there is progressive height loss at the T2, T3, T4, T5, T8, T9, T10, T11, and T12 vertebral bodies, consistent with interval pathologic compression fractures. Associated bony retropulsion and/or abnormal convex posterior contour now seen at multiple levels, most pronounced at T2, T3, T4, T6, and T8. Cord: Grossly normal signal and morphology. No appreciable cord signal changes on this motion degraded exam. Paraspinal and other soft tissues: Mild edema seen throughout the upper and mid paraspinous soft tissues, presumably reactive. No collections. Small layering bilateral pleural effusions, left greater than right. Undo that Disc levels: No significant disc pathology seen within the thoracic spine. However, the bony retropulsion and/or posterior convex bowing seen at multiple levels extending from T2 through T9, in combination with prominence of the dorsal epidural fat results in mild to  moderate diffuse spinal stenosis, most pronounced at T2 through T4 and T8. Mild flattening of the thoracic cord at several levels, most pronounced at T4 and T8, but no visible cord signal changes. Mild to moderate bilateral bony foraminal narrowing present at T1-2 through T5-6, most pronounced at T4-5 bilaterally. Otherwise, no foramina remain patent. IMPRESSION: 1. Innumerable osseous metastases throughout the thoracic spine, consistent with history of metastatic prostate cancer. Progressive height loss at the T2, T3, T4, T5, T8, T9, T10, T11, and T12 vertebral bodies, consistent with interval pathologic compression fractures. Associated bony retropulsion and/or abnormal convex posterior contour now seen at multiple levels, most pronounced at T2 through T8. 2. Mild to moderate diffuse spinal stenosis extending from T2 through T9, most pronounced at T2 through T4 and T8. 3. Mild to moderate bilateral bony foraminal narrowing at T1-2 through T5-6, most pronounced at T4-5. 4. Small layering bilateral pleural effusions, left greater than right. Electronically Signed   By: Jeannine Boga M.D.   On: 05/20/2021 19:14   MR LUMBAR SPINE WO CONTRAST  Result Date: 05/20/2021 CLINICAL DATA:  Low back pain, cauda equina syndrome suspected. Weakness and numbness in the lower extremities. Metastatic prostate cancer. EXAM: MRI LUMBAR SPINE WITHOUT CONTRAST TECHNIQUE: Multiplanar, multisequence MR imaging of the lumbar spine was performed. No intravenous contrast was administered.  COMPARISON:  Lumbar spine CT 02/05/2021. CT abdomen and pelvis 04/02/2021. FINDINGS: The patient terminated the examination prior to completion. A complete noncontrast study was obtained, however no IV contrast was administered. Segmentation: Standard. Alignment:  Normal. Vertebrae: Diffusely abnormal, heterogeneous marrow signal throughout the lumbar spine, included lower thoracic spine, and pelvis corresponding to known widespread metastases.  Mild L1 superior endplate compression fracture with 15% anterior vertebral body height loss which is new from 04/02/2021 L2 compression fracture with 25% height loss asymmetric to the right, also new. Multiple small Schmorl's nodes in the lumbar and lower thoracic spine. No evidence of epidural tumor on this unenhanced study. Conus medullaris and cauda equina: Conus extends to the L1-2 level. Conus and cauda equina appear normal. Paraspinal and other soft tissues: Partially visualized moderate bladder distension. Disc levels: T12-L1: Negative. L1-2: Mild left facet hypertrophy without disc herniation or stenosis. L2-3: Minimal disc bulging and mild facet hypertrophy without stenosis. L3-4: Mild facet hypertrophy without stenosis. L4-5: Disc bulging and severe left and moderate right facet hypertrophy result in mild spinal stenosis, mild bilateral lateral recess stenosis, and mild left neural foraminal stenosis, similar to the 02/05/2021 CT. L5-S1: Mild right and moderate left facet hypertrophy without disc herniation or stenosis. IMPRESSION: 1. Known widespread osseous metastases. No evidence of epidural tumor on this unenhanced study. 2. Mild L1 and L2 compression fractures, new from 04/02/2021. 3. Unchanged mild multifactorial spinal stenosis at L4-5. Electronically Signed   By: Logan Bores M.D.   On: 05/20/2021 13:26   DG CHEST PORT 1 VIEW  Result Date: 05/05/2021 CLINICAL DATA:  Chest pain, metastatic prostatic carcinoma EXAM: PORTABLE CHEST 1 VIEW COMPARISON:  Previous studies done on 05/01/2021 FINDINGS: Heart is enlarged in size. There are no signs of pulmonary edema. There is pleural density in the lateral aspect of left upper lung fields. There is expansile lesion in the left fifth rib. There is blunting of left lateral CP angle suggesting small effusion. Increased density in the left lower lung fields may be due to pleural effusion and possibly underlying atelectasis/pneumonia. Right lateral CP angle  is clear. There is no pneumothorax. IMPRESSION: Cardiomegaly. There is increased density in the left lower lung fields obscuring left hemidiaphragm suggesting pleural effusion and possibly underlying infiltrate. There is pleural density in the lateral aspect of left upper lung fields related to expansile lesion in the left fifth rib suggesting neoplastic process such as metastatic disease. Electronically Signed   By: Elmer Picker M.D.   On: 05/05/2021 17:44   DG Chest Port 1 View  Result Date: 05/01/2021 CLINICAL DATA:  Chest pain EXAM: PORTABLE CHEST 1 VIEW COMPARISON:  03/05/2021 FINDINGS: Small left pleural effusion with adjacent atelectasis. Similar cardiomegaly. No pneumothorax. Left pleural thickening laterally underlying rib lesions seen on CT. IMPRESSION: Small left pleural effusion with adjacent atelectasis. Electronically Signed   By: Macy Mis M.D.   On: 05/01/2021 10:45   ECHOCARDIOGRAM COMPLETE  Result Date: 05/12/2021    ECHOCARDIOGRAM REPORT   Patient Name:   Randall Larsen. Date of Exam: 05/12/2021 Medical Rec #:  [id number]         Height:       70.0 in Accession #:    [id number]        Weight:       218.5 lb Date of Birth:  1956/01/02         BSA:          2.167 m Patient Age:    66  years          BP:           165/75 mmHg Patient Gender: M                 HR:           78 bpm. Exam Location:  Inpatient Procedure: 2D Echo Indications:    Chest pain  History:        Patient has no prior history of Echocardiogram examinations.                 Risk Factors:Hypertension.  Sonographer:    Jefferey Pica Referring Phys: [place]  1. Left ventricular ejection fraction, by estimation, is 60 to 65%. The left ventricle has normal function. The left ventricle has no regional wall motion abnormalities. There is severe left ventricular hypertrophy. Left ventricular diastolic parameters  were normal.  2. Right ventricular systolic function is normal. The right  ventricular size is normal. There is normal pulmonary artery systolic pressure.  3. Left atrial size was mildly dilated.  4. A small pericardial effusion is present. The pericardial effusion is posterior to the left ventricle.  5. The mitral valve is abnormal. Trivial mitral valve regurgitation. No evidence of mitral stenosis.  6. The aortic valve is tricuspid. There is mild calcification of the aortic valve. Aortic valve regurgitation is not visualized. Aortic valve sclerosis is present, with no evidence of aortic valve stenosis.  7. The inferior vena cava is normal in size with greater than 50% respiratory variability, suggesting right atrial pressure of 3 mmHg. FINDINGS  Left Ventricle: Left ventricular ejection fraction, by estimation, is 60 to 65%. The left ventricle has normal function. The left ventricle has no regional wall motion abnormalities. The left ventricular internal cavity size was normal in size. There is  severe left ventricular hypertrophy. Left ventricular diastolic parameters were normal. Right Ventricle: The right ventricular size is normal. No increase in right ventricular wall thickness. Right ventricular systolic function is normal. There is normal pulmonary artery systolic pressure. The tricuspid regurgitant velocity is 2.57 m/s, and  with an assumed right atrial pressure of 5 mmHg, the estimated right ventricular systolic pressure is 25.0 mmHg. Left Atrium: Left atrial size was mildly dilated. Right Atrium: Right atrial size was normal in size. Pericardium: A small pericardial effusion is present. The pericardial effusion is posterior to the left ventricle. Mitral Valve: The mitral valve is abnormal. There is mild thickening of the mitral valve leaflet(s). There is mild calcification of the mitral valve leaflet(s). Trivial mitral valve regurgitation. No evidence of mitral valve stenosis. Tricuspid Valve: The tricuspid valve is normal in structure. Tricuspid valve regurgitation is mild .  No evidence of tricuspid stenosis. Aortic Valve: The aortic valve is tricuspid. There is mild calcification of the aortic valve. Aortic valve regurgitation is not visualized. Aortic valve sclerosis is present, with no evidence of aortic valve stenosis. Aortic valve peak gradient measures 10.1 mmHg. Pulmonic Valve: The pulmonic valve was normal in structure. Pulmonic valve regurgitation is not visualized. No evidence of pulmonic stenosis. Aorta: The aortic root is normal in size and structure. Venous: The inferior vena cava is normal in size with greater than 50% respiratory variability, suggesting right atrial pressure of 3 mmHg. IAS/Shunts: No atrial level shunt detected by color flow Doppler.  LEFT VENTRICLE PLAX 2D LVIDd:         5.30 cm Diastology LVIDs:         3.70  cm LV e' medial:  5.10 cm/s LV PW:         1.60 cm LV e' lateral: 7.95 cm/s LV IVS:        1.70 cm  RIGHT VENTRICLE          IVC RV Basal diam:  2.70 cm  IVC diam: 1.40 cm TAPSE (M-mode): 3.8 cm LEFT ATRIUM              Index        RIGHT ATRIUM           Index LA diam:        4.00 cm  1.85 cm/m   RA Area:     23.50 cm LA Vol (A2C):   106.0 ml 48.91 ml/m  RA Volume:   74.70 ml  34.47 ml/m LA Vol (A4C):   109.0 ml 50.30 ml/m LA Biplane Vol: 110.0 ml 50.76 ml/m  AORTIC VALVE              PULMONIC VALVE AV Vmax:      159.00 cm/s PV Vmax:       1.06 m/s AV Peak Grad: 10.1 mmHg   PV Peak grad:  4.5 mmHg LVOT Vmax:    117.00 cm/s LVOT Vmean:   82.600 cm/s LVOT VTI:     0.292 m  AORTA Ao Root diam: 3.70 cm Ao Asc diam:  3.50 cm MV A Prime: 7.2 cm/s  TRICUSPID VALVE                       TR Peak grad:   26.4 mmHg                       TR Vmax:        257.00 cm/s                        SHUNTS                       Systemic VTI: 0.29 m Jenkins Rouge MD Electronically signed by Jenkins Rouge MD Signature Date/Time: 05/12/2021/1:51:55 PM    Final    DG HIPS BILAT WITH PELVIS 2V  Result Date: 05/20/2021 CLINICAL DATA:  66 year old male with history of  trauma from a fall with bilateral hip pain. History of prostate cancer. EXAM: DG HIP (WITH OR WITHOUT PELVIS) 2V BILAT COMPARISON:  No priors. FINDINGS: Five views of the bony pelvis and bilateral hips demonstrate no acute displaced fracture, subluxation or dislocation. There is joint space narrowing, subchondral sclerosis and osteophyte formation associated with both hip joints, indicative of moderate osteoarthritis. Innumerable poorly defined sclerotic areas are noted throughout the visualized portions of the axial and appendicular skeleton, indicative of widespread metastatic disease to the bones. IMPRESSION: 1. No acute radiographic abnormality of the bony pelvis. 2. Widespread metastatic disease to the bones. 3. Moderate bilateral hip joint osteoarthritis. Electronically Signed   By: Vinnie Langton M.D.   On: 05/20/2021 08:07   VAS [place] LOWER EXTREMITY VENOUS (DVT)  Result Date: 05/13/2021  Lower Venous DVT Study Patient Name:  Randall Larsen.  Date of Exam:   05/12/2021 Medical Rec #: [id number]          Accession #:    [id number] Date of Birth: 1955/05/28          Patient Gender: M Patient Age:   89 years Exam Location:  [facility] Procedure:      VAS [place] LOWER EXTREMITY VENOUS (DVT) Referring Phys: Shawna Clamp --------------------------------------------------------------------------------  Indications: Edema.  Risk Factors: CA patient. Comparison Study: Previous exam on 05/02/2021 was negative for DVT Performing Technologist: Rogelia Rohrer RVT, RDMS  Examination Guidelines: A complete evaluation includes B-mode imaging, spectral Doppler, color Doppler, and power Doppler as needed of all accessible portions of each vessel. Bilateral testing is considered an integral part of a complete examination. Limited examinations for reoccurring indications may be performed as noted. The reflux portion of the exam is performed with the patient in reverse Trendelenburg.   +---------+---------------+---------+-----------+----------+--------------+  RIGHT     Compressibility Phasicity Spontaneity Properties Thrombus Aging  +---------+---------------+---------+-----------+----------+--------------+  CFV       Full            Yes       Yes                                    +---------+---------------+---------+-----------+----------+--------------+  SFJ       Full                                                             +---------+---------------+---------+-----------+----------+--------------+  FV Prox   Full            Yes       Yes                                    +---------+---------------+---------+-----------+----------+--------------+  FV Mid    Full            Yes       Yes                                    +---------+---------------+---------+-----------+----------+--------------+  FV Distal Full            Yes       Yes                                    +---------+---------------+---------+-----------+----------+--------------+  PFV       Full                                                             +---------+---------------+---------+-----------+----------+--------------+  POP       Full            Yes       Yes                                    +---------+---------------+---------+-----------+----------+--------------+  PTV       Full                                                             +---------+---------------+---------+-----------+----------+--------------+  PERO      Full                                                             +---------+---------------+---------+-----------+----------+--------------+   +---------+---------------+---------+-----------+----------+--------------+  LEFT      Compressibility Phasicity Spontaneity Properties Thrombus Aging  +---------+---------------+---------+-----------+----------+--------------+  CFV       Full            Yes       Yes                                     +---------+---------------+---------+-----------+----------+--------------+  SFJ       Full                                                             +---------+---------------+---------+-----------+----------+--------------+  FV Prox   Full            Yes       Yes                                    +---------+---------------+---------+-----------+----------+--------------+  FV Mid    Full            Yes       Yes                                    +---------+---------------+---------+-----------+----------+--------------+  FV Distal Full            Yes       Yes                                    +---------+---------------+---------+-----------+----------+--------------+  PFV       Full                                                             +---------+---------------+---------+-----------+----------+--------------+  POP       Full            Yes       Yes                                    +---------+---------------+---------+-----------+----------+--------------+  PTV       Full                                                             +---------+---------------+---------+-----------+----------+--------------+  PERO      Full                                                             +---------+---------------+---------+-----------+----------+--------------+     Summary: BILATERAL: - No evidence of deep vein thrombosis seen in the lower extremities, bilaterally. - No evidence of superficial venous thrombosis in the lower extremities, bilaterally. -No evidence of popliteal cyst, bilaterally. Diffuse subcutaneous edema, bilaterally.   *See table(s) above for measurements and observations. Electronically signed by Harold Barban MD on 05/13/2021 at 5:31:16 PM.    Final    VAS [place] LOWER EXTREMITY VENOUS (DVT)  Result Date: 05/02/2021  Lower Venous DVT Study Patient Name:  Saafir Abdullah.  Date of Exam:   05/02/2021 Medical Rec #: [id number]          Accession #:    [id number] Date of Birth: [birth year]           Patient Gender: M Patient Age:   68 years Exam Location:  [facility] Procedure:      VAS [place] LOWER EXTREMITY VENOUS (DVT) Referring Phys: Nyoka Lint DOUTOVA --------------------------------------------------------------------------------  Indications: Elevated D-dimer (13.00).  Risk Factors: CA patient. Comparison Study: Previous exam on 04/07/2021 was negative for DVT. Performing Technologist: Rogelia Rohrer RVT, RDMS  Examination Guidelines: A complete evaluation includes B-mode imaging, spectral Doppler, color Doppler, and power Doppler as needed of all accessible portions of each vessel. Bilateral testing is considered an integral part of a complete examination. Limited examinations for reoccurring indications may be performed as noted. The reflux portion of the exam is performed with the patient in reverse Trendelenburg.  +---------+---------------+---------+-----------+----------+--------------+  RIGHT     Compressibility Phasicity Spontaneity Properties Thrombus Aging  +---------+---------------+---------+-----------+----------+--------------+  CFV       Full            Yes       Yes                                    +---------+---------------+---------+-----------+----------+--------------+  SFJ       Full                                                             +---------+---------------+---------+-----------+----------+--------------+  FV Prox   Full            Yes       Yes                                    +---------+---------------+---------+-----------+----------+--------------+  FV Mid    Full            Yes       Yes                                    +---------+---------------+---------+-----------+----------+--------------+  FV Distal Full  Yes       Yes                                    +---------+---------------+---------+-----------+----------+--------------+  PFV       Full                                                              +---------+---------------+---------+-----------+----------+--------------+  POP       Full            Yes       Yes                                    +---------+---------------+---------+-----------+----------+--------------+  PTV       Full                                                             +---------+---------------+---------+-----------+----------+--------------+  PERO      Full                                                             +---------+---------------+---------+-----------+----------+--------------+   +---------+---------------+---------+-----------+----------+--------------+  LEFT      Compressibility Phasicity Spontaneity Properties Thrombus Aging  +---------+---------------+---------+-----------+----------+--------------+  CFV       Full            Yes       Yes                                    +---------+---------------+---------+-----------+----------+--------------+  SFJ       Full                                                             +---------+---------------+---------+-----------+----------+--------------+  FV Prox   Full            Yes       Yes                                    +---------+---------------+---------+-----------+----------+--------------+  FV Mid    Full            Yes       Yes                                    +---------+---------------+---------+-----------+----------+--------------+  FV Distal Full  Yes       Yes                                    +---------+---------------+---------+-----------+----------+--------------+  PFV       Full                                                             +---------+---------------+---------+-----------+----------+--------------+  POP       Full            Yes       Yes                                    +---------+---------------+---------+-----------+----------+--------------+  PTV       Full                                                              +---------+---------------+---------+-----------+----------+--------------+  PERO      Full                                                             +---------+---------------+---------+-----------+----------+--------------+     Summary: BILATERAL: - No evidence of deep vein thrombosis seen in the lower extremities, bilaterally. - No evidence of superficial venous thrombosis in the lower extremities, bilaterally. -No evidence of popliteal cyst, bilaterally.  LEFT: Subcutaneous edema noted in area of calf.  *See table(s) above for measurements and observations. Electronically signed by Deitra Mayo MD on 05/02/2021 at 2:22:35 PM.    Final     Microbiology: Results for orders placed or performed during the hospital encounter of 05/01/21  Resp Panel by RT-PCR (Flu A&B, Covid) Nasopharyngeal Swab     Status: None   Collection Time: 05/01/21  7:02 PM   Specimen: Nasopharyngeal Swab; Nasopharyngeal(NP) swabs in vial transport medium  Result Value Ref Range Status   SARS Coronavirus 2 by RT PCR NEGATIVE NEGATIVE Final    Comment: (NOTE) SARS-CoV-2 target nucleic acids are NOT DETECTED.  The SARS-CoV-2 RNA is generally detectable in upper respiratory specimens during the acute phase of infection. The lowest concentration of SARS-CoV-2 viral copies this assay can detect is 138 copies/mL. A negative result does not preclude SARS-Cov-2 infection and should not be used as the sole basis for treatment or other patient management decisions. A negative result may occur with  improper specimen collection/handling, submission of specimen other than nasopharyngeal swab, presence of viral mutation(s) within the areas targeted by this assay, and inadequate number of viral copies(<138 copies/mL). A negative result must be combined with clinical observations, patient history, and epidemiological information. The expected result is Negative.  Fact Sheet for Patients:   EntrepreneurPulse.com.au  Fact Sheet for Healthcare Providers:  IncredibleEmployment.be  This test is no t yet  approved or cleared by the [place] and  has been authorized for detection and/or diagnosis of SARS-CoV-2 by FDA under an Emergency Use Authorization (EUA). This EUA will remain  in effect (meaning this test can be used) for the duration of the COVID-19 declaration under Section 564(b)(1) of the Act, 21 U.S.C.section 360bbb-3(b)(1), unless the authorization is terminated  or revoked sooner.       Influenza A by PCR NEGATIVE NEGATIVE Final   Influenza B by PCR NEGATIVE NEGATIVE Final    Comment: (NOTE) The Xpert Xpress SARS-CoV-2/FLU/RSV plus assay is intended as an aid in the diagnosis of influenza from Nasopharyngeal swab specimens and should not be used as a sole basis for treatment. Nasal washings and aspirates are unacceptable for Xpert Xpress SARS-CoV-2/FLU/RSV testing.  Fact Sheet for Patients: EntrepreneurPulse.com.au  Fact Sheet for Healthcare Providers: IncredibleEmployment.be  This test is not yet approved or cleared by the [place] FDA and has been authorized for detection and/or diagnosis of SARS-CoV-2 by FDA under an Emergency Use Authorization (EUA). This EUA will remain in effect (meaning this test can be used) for the duration of the COVID-19 declaration under Section 564(b)(1) of the Act, 21 U.S.C. section 360bbb-3(b)(1), unless the authorization is terminated or revoked.  Performed at [facility], [place] [address] [place], [place] [address]     Labs: CBC: Recent Labs  Lab 05/24/21 0525 05/29/21 0441  WBC 7.0 7.9  NEUTROABS 5.5  --   HGB 10.0* 10.2*  HCT 31.5* 31.6*  MCV 96.0 95.8  PLT 122* 606*   Basic Metabolic Panel: Recent Labs  Lab 05/24/21 0525 05/29/21 0441  NA 138 134*  K 4.7 4.5  CL 105 102  CO2 26 23  GLUCOSE 120*  154*  BUN 41* 55*  CREATININE 1.02 1.07  CALCIUM 8.6* 8.4*  MG 2.4 2.6*  PHOS 3.6 3.9   Liver Function Tests: Recent Labs  Lab 05/24/21 0525 05/29/21 0441  AST 37 55*  ALT 80* 58*  ALKPHOS 246* 398*  BILITOT 0.2* 0.5  PROT 5.5* 5.4*  ALBUMIN 2.9* 3.1*   CBG: No results for input(s): GLUCAP in the last 168 hours.  Discharge time spent: greater than 30 minutes.  Signed: Shawna Clamp, MD [facility] 05/30/2021

## 2021-05-30 NOTE — Progress Notes (Signed)
Patient discharging to [facility].  Report called to [place] at facility.  IV removed - WNL.  AVS completed and placed in DC packet.  Prescriptions included.  Patient aware and agreeable of DC plan.  Awaiting PTAR for pick up in NAD

## 2021-05-30 NOTE — TOC Transition Note (Addendum)
Transition of Care [facility]) - CM/SW Discharge Note   Patient Details  Name: Randall Larsen. MRN: [id number] Date of Birth: [birth year]  [facility] CM/SW Contact:  Dessa Phi, RN Phone Number: 05/30/2021, 9:31 AM   Clinical Narrative:d/c today [name] aware.auth ends today. Will send d/c summary once available. Await rm#,tel# for Nsg report.PTAR for transport.   -12:23p-PTAR called.     Final next level of care: Skilled Nursing Facility Barriers to Discharge: No Barriers Identified   Patient Goals and CMS Choice Patient states their goals for this hospitalization and ongoing recovery are:: home CMS Medicare.gov Compare Post Acute Care list provided to:: Patient Choice offered to / list presented to : Patient  Discharge Placement              Patient chooses bed at: Other - please specify in the comment section below: [name] Patient to be transferred to facility by: PTAR   Patient and family notified of of transfer: 05/30/21  Discharge Plan and Services   Discharge Planning Services: CM Consult                                 Social Determinants of Health (SDOH) Interventions     Readmission Risk Interventions No flowsheet data found.

## 2021-05-30 NOTE — Progress Notes (Signed)
° ° ° °  Patient sitting up in bed. No acute distress noted. Pain well controlled. [name] shares understanding of discharge plans. Expresses apologies on the delay of discharge as he now recalls previous conversations regarding [facility] facility.   He is ready to be discharge and remains hopeful for some improvement. He is aware we (Palliative) will remain in close contact once at the facility. We will plan to schedule a virtual meeting in the upcoming weeks.   All prescriptions required is on chart.   Alda Lea, AGPCNP-BC Palliative Medicine Team  Pager: [phone] Amion: N. [place]   NO CHARGE

## 2021-05-30 NOTE — Discharge Instructions (Signed)
Advised to follow-up with primary care physician in 1 week. Advised to follow-up with oncologist as per schedule. 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

## 2021-05-31 ENCOUNTER — Other Ambulatory Visit: Payer: Self-pay

## 2021-05-31 ENCOUNTER — Emergency Department (HOSPITAL_COMMUNITY): Payer: Medicare HMO

## 2021-05-31 ENCOUNTER — Emergency Department (HOSPITAL_COMMUNITY)
Admission: EM | Admit: 2021-05-31 | Discharge: 2021-05-31 | Disposition: A | Discharge status: Skilled Nursing Facility | Payer: Medicare HMO | Attending: Emergency Medicine | Admitting: Emergency Medicine

## 2021-05-31 ENCOUNTER — Encounter (HOSPITAL_COMMUNITY): Payer: Self-pay

## 2021-05-31 DIAGNOSIS — I10 Essential (primary) hypertension: Secondary | ICD-10-CM | POA: Diagnosis not present

## 2021-05-31 DIAGNOSIS — Z8546 Personal history of malignant neoplasm of prostate: Secondary | ICD-10-CM | POA: Diagnosis not present

## 2021-05-31 DIAGNOSIS — Z79899 Other long term (current) drug therapy: Secondary | ICD-10-CM | POA: Diagnosis not present

## 2021-05-31 DIAGNOSIS — G893 Neoplasm related pain (acute) (chronic): Secondary | ICD-10-CM | POA: Diagnosis not present

## 2021-05-31 DIAGNOSIS — R1013 Epigastric pain: Secondary | ICD-10-CM | POA: Diagnosis present

## 2021-05-31 LAB — BASIC METABOLIC PANEL
Anion gap: 8 (ref 5–15)
BUN: 58 mg/dL — ABNORMAL HIGH (ref 8–23)
CO2: 23 mmol/L (ref 22–32)
Calcium: 8 mg/dL — ABNORMAL LOW (ref 8.9–10.3)
Chloride: 105 mmol/L (ref 98–111)
Creatinine, Ser: 1.21 mg/dL (ref 0.61–1.24)
GFR, Estimated: >60 mL/min (ref >60)
Glucose, Bld: 108 mg/dL — ABNORMAL HIGH (ref 70–99)
Potassium: 3.7 mmol/L (ref 3.5–5.1)
Sodium: 136 mmol/L (ref 135–145)

## 2021-05-31 LAB — CBC WITH DIFFERENTIAL/PLATELET
Abs Immature Granulocytes: 0.35 10*3/uL — ABNORMAL HIGH (ref 0.00–0.07)
Basophils Absolute: 0 10*3/uL (ref 0.0–0.1)
Basophils Relative: 1 %
Eosinophils Absolute: 0 10*3/uL (ref 0.0–0.5)
Eosinophils Relative: 0 %
HCT: 29.5 % — ABNORMAL LOW (ref 39.0–52.0)
Hemoglobin: 9.8 g/dL — ABNORMAL LOW (ref 13.0–17.0)
Immature Granulocytes: 5 %
Lymphocytes Relative: 10 %
Lymphs Abs: 0.6 10*3/uL — ABNORMAL LOW (ref 0.7–4.0)
MCH: 32.2 pg (ref 26.0–34.0)
MCHC: 33.2 g/dL (ref 30.0–36.0)
MCV: 97 fL (ref 80.0–100.0)
Monocytes Absolute: 0.6 10*3/uL (ref 0.1–1.0)
Monocytes Relative: 9 %
Neutro Abs: 4.9 10*3/uL (ref 1.7–7.7)
Neutrophils Relative %: 75 %
Platelets: 122 10*3/uL — ABNORMAL LOW (ref 150–400)
RBC: 3.04 MIL/uL — ABNORMAL LOW (ref 4.22–5.81)
RDW: 21.2 % — ABNORMAL HIGH (ref 11.5–15.5)
WBC: 6.5 10*3/uL (ref 4.0–10.5)
nRBC: 9.5 % — ABNORMAL HIGH (ref 0.0–0.2)

## 2021-05-31 LAB — TROPONIN I (HIGH SENSITIVITY)
Troponin I (High Sensitivity): 24 ng/L — ABNORMAL HIGH (ref <18)
Troponin I (High Sensitivity): 22 ng/L — ABNORMAL HIGH (ref <18)

## 2021-05-31 IMAGING — CR DG CHEST 1V
1 series · 1 of 1 positions shown · non-contrast
Comparison: [DATE], CT [DATE]

CLINICAL DATA: Chest pain

EXAM:
CHEST  1 VIEW

[x chest ap]
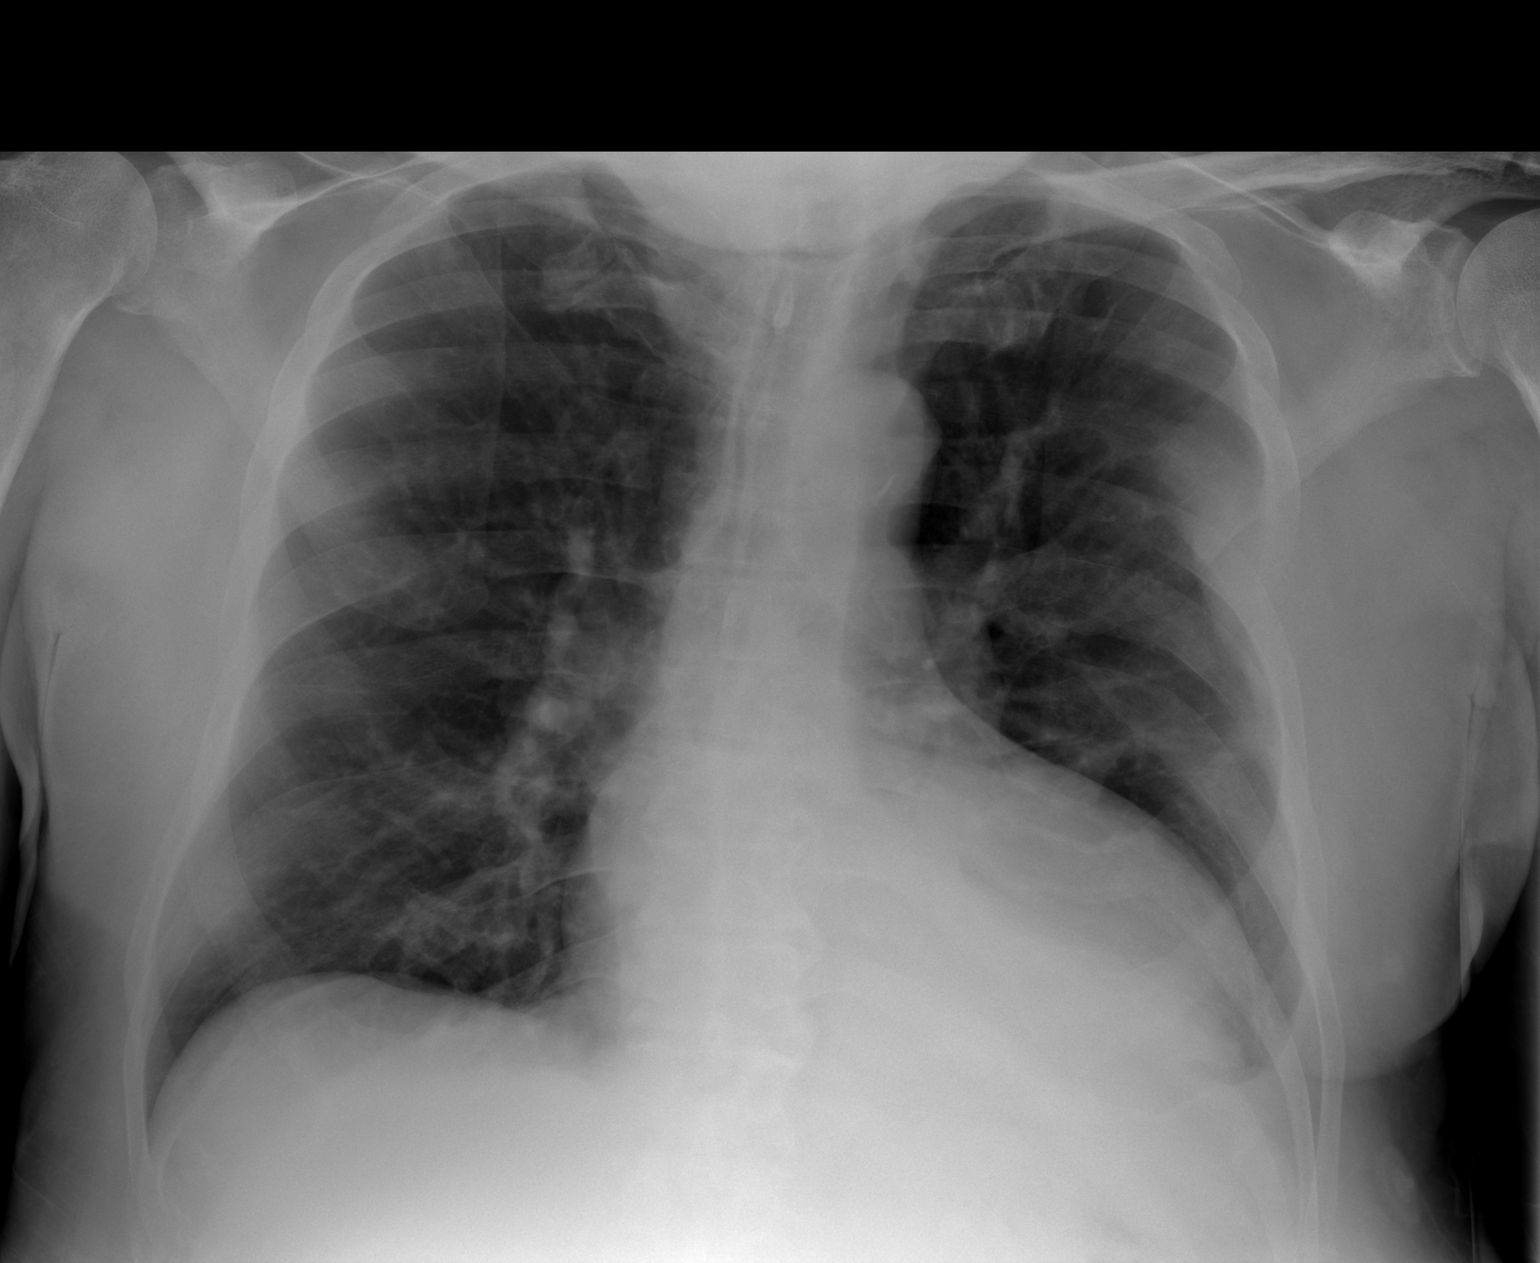

[1 of 1 positions shown; findings below may reference images not displayed]

FINDINGS: Right lung is grossly clear. Small left-sided effusion with airspace
disease at the left base. Left fifth rib lesion with adjacent
pleural disease. No pneumothorax
IMPRESSION: 1. Small left-sided effusion with airspace disease at the left base
which may be due to atelectasis or pneumonia
2. Destructive left fifth rib lesion with adjacent pleural disease.

## 2021-05-31 MED ORDER — HYDROMORPHONE HCL 2 MG/ML IJ SOLN
2.0000 mg | Freq: Once | INTRAMUSCULAR | Status: AC
  Administered 2021-05-31: 2 mg via INTRAVENOUS
  Filled 2021-05-31: qty 1

## 2021-05-31 NOTE — ED Notes (Signed)
EMS convo called to [employer]

## 2021-05-31 NOTE — ED Notes (Signed)
Patient currently sleeping on stretcher

## 2021-05-31 NOTE — ED Provider Notes (Signed)
[facility] EMERGENCY DEPARTMENT Provider Note   CSN: [id number] Arrival date & time: 05/31/21  1505     History  Chief Complaint  Patient presents with   Pain Management    Randall Larsen. is a 66 y.o. male.  HPI  Patient with medical history including metastatic prostate cancer, hypertension, not getting chemotherapy presents with complaints of pain.  Patient states that he was just discharged from the hospital and sent to a rehab facility.  On  arrival the facility did not have any of his pain medications and sent here because he needs  pain medication.  He states that he has pain in his epigastric region this pain only occurs when they move him, while at rest he has no pain, he has no other complaints.  He denies any fevers, chills, shortness of breath, pleuritic chest pain, stomach pains, nausea, vomiting, diarrhea, general body aches.  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  Palliative care round on the patient and he will be sent to the [facility] where he was started on hydromorphone methadone and Ativan.  Spoke with nurse at patient's facility and they note that they have not received his pain medications, they hopefully will get them today and will call me back to see when exactly will get them.  Home Medications Prior to Admission medications   Medication Sig Start Date End Date Taking? Authorizing Provider  acetaminophen (TYLENOL) 500 MG tablet Take 2 tablets (1,000 mg total) by mouth 3 (three) times daily. 05/30/21   Shawna Clamp, MD  celecoxib (CELEBREX) 200 MG capsule Take 1 capsule (200 mg total)  by mouth 2 (two) times daily. 05/30/21   Shawna Clamp, MD  docusate sodium (COLACE) 100 MG capsule Take 1 capsule (100 mg total) by mouth every 12 (twelve) hours. 04/02/21   Drenda Freeze, MD  furosemide (LASIX) 40 MG tablet Take 1 tablet (40 mg total) by mouth daily. 05/30/21 05/30/22  Shawna Clamp, MD  gabapentin (NEURONTIN) 300 MG capsule Take 1 capsule (300 mg total) by mouth 2 (two) times daily. 05/30/21   Shawna Clamp, MD  hydrALAZINE (APRESOLINE) 50 MG tablet Take 1 tablet (50 mg total) by mouth 3 (three) times daily. 02/08/21   Carlisle Cater, PA-C  HYDROmorphone (DILAUDID) 4 MG tablet Take 1-1.5 tablets (4-6 mg total) by mouth every 3 (three) hours as needed for severe pain or moderate pain (for breakthrough pain). 05/30/21   Pickenpack-Cousar, Carlena Sax, NP  lidocaine (LIDODERM) 5 % Place 1 patch onto the skin daily. Remove & Discard patch within 12 hours or as directed by MD 04/26/21   Pickenpack-Cousar, Carlena Sax, NP  LORazepam (ATIVAN) 0.5 MG tablet Take 1 tablet (0.5 mg total) by mouth 2 (two) times daily as needed for anxiety. 05/30/21   Shawna Clamp, MD  methadone (DOLOPHINE) 10 MG tablet Take 2 tablets (20 mg total) by mouth every 8 (eight) hours. 05/30/21   Pickenpack-Cousar, Carlena Sax, NP  methocarbamol (ROBAXIN) 500 MG tablet Take 1 tablet (500 mg total) by mouth 3 (three) times daily as needed for muscle spasms. 05/30/21   Shawna Clamp, MD  metoprolol tartrate (LOPRESSOR)  25 MG tablet Take 1 tablet (25 mg total) by mouth 2 (two) times daily. 05/30/21   Shawna Clamp, MD  Multiple Vitamin (MULTIVITAMIN WITH MINERALS) TABS tablet Take 1 tablet by mouth daily.    [provider]  ondansetron (ZOFRAN) 4 MG tablet Take 1 tablet (4 mg total) by mouth every 8 (eight) hours as needed for nausea or vomiting. 04/26/21   Pickenpack-Cousar, Carlena Sax, NP  pantoprazole (PROTONIX) 40 MG tablet Take 1 tablet (40 mg total) by mouth 2 (two) times daily. 05/30/21   Shawna Clamp, MD   polyethylene glycol (MIRALAX / GLYCOLAX) 17 g packet Take 17 g by mouth 2 (two) times daily. 04/02/21   Drenda Freeze, MD      Allergies    Patient has no known allergies.    Review of Systems   Review of Systems  Constitutional:  Negative for chills and fever.  Respiratory:  Negative for shortness of breath.   Cardiovascular:  Negative for chest pain.  Gastrointestinal:  Negative for abdominal pain.  Neurological:  Negative for headaches.   Physical Exam Updated Vital Signs BP 140/65 (BP Location: Left Arm)    Pulse 78    Temp 98.5 F (36.9 C) (Oral)    Resp 16    Ht 5\' 10"  (1.778 m)    Wt 103.1 kg    SpO2 96%    BMI 32.61 kg/m  Physical Exam Vitals and nursing note reviewed.  Constitutional:      General: He is not in acute distress.    Appearance: He is not ill-appearing.     Comments: Chronically ill, deconditioned state.  HENT:     Head: Normocephalic and atraumatic.     Nose: No congestion.  Eyes:     Conjunctiva/sclera: Conjunctivae normal.  Cardiovascular:     Rate and Rhythm: Normal rate and regular rhythm.     Pulses: Normal pulses.     Heart sounds: No murmur heard.   No friction rub. No gallop.  Pulmonary:     Effort: No respiratory distress.     Breath sounds: No wheezing, rhonchi or rales.  Abdominal:     Palpations: Abdomen is soft.     Tenderness: There is no abdominal tenderness. There is no right CVA tenderness or left CVA tenderness.  Musculoskeletal:     Right lower leg: No edema.     Left lower leg: No edema.  Skin:    General: Skin is warm and dry.  Neurological:     Mental Status: He is alert.  Psychiatric:        Mood and Affect: Mood normal.    ED Results / Procedures / Treatments   Labs (all labs ordered are listed, but only abnormal results are displayed) Labs Reviewed  BASIC METABOLIC PANEL - Abnormal; Notable for the following components:      Result Value   Glucose, Bld 108 (*)    BUN 58 (*)    Calcium 8.0 (*)    All  other components within normal limits  CBC WITH DIFFERENTIAL/PLATELET - Abnormal; Notable for the following components:   RBC 3.04 (*)    Hemoglobin 9.8 (*)    HCT 29.5 (*)    RDW 21.2 (*)    Platelets 122 (*)    nRBC 9.5 (*)    Lymphs Abs 0.6 (*)    Abs Immature Granulocytes 0.35 (*)    All other components within normal limits  TROPONIN I (HIGH SENSITIVITY) - Abnormal; Notable for  the following components:   Troponin I (High Sensitivity) 24 (*)    All other components within normal limits  TROPONIN I (HIGH SENSITIVITY) - Abnormal; Notable for the following components:   Troponin I (High Sensitivity) 22 (*)    All other components within normal limits    EKG EKG Interpretation  Date/Time:  Wednesday May 31 2021 16:46:31 EST Ventricular Rate:  74 PR Interval:  176 QRS Duration: 98 QT Interval:  428 QTC Calculation: 475 R Axis:   -28 Text Interpretation: Sinus rhythm Minimal voltage criteria for LVH, may be normal variant ( Cornell product ) Confirmed by Godfrey Pick [phone]) on 05/31/2021 7:02:32 PM  Radiology DG Chest 1 View  Result Date: 05/31/2021 CLINICAL DATA:  Chest pain EXAM: CHEST  1 VIEW COMPARISON:  05/05/2021, CT 05/01/2021 FINDINGS: Right lung is grossly clear. Small left-sided effusion with airspace disease at the left base. Left fifth rib lesion with adjacent pleural disease. No pneumothorax IMPRESSION: 1. Small left-sided effusion with airspace disease at the left base which may be due to atelectasis or pneumonia 2. Destructive left fifth rib lesion with adjacent pleural disease. Electronically Signed   By: Donavan Foil M.D.   On: 05/31/2021 17:15    Procedures Procedures    Medications Ordered in ED Medications  HYDROmorphone (DILAUDID) injection 2 mg (2 mg Intravenous Given 05/31/21 1642)    ED Course/ Medical Decision Making/ A&P                           Medical Decision Making Amount and/or Complexity of Data Reviewed Labs: ordered. Radiology:  ordered.  Risk Prescription drug management.   This patient presents to the ED for concern of pain management, this involves an extensive number of treatment options, and is a complaint that carries with it a high risk of complications and morbidity.  The differential diagnosis includes ACS, metabolic abnormality, pneumonia    Additional history obtained:  Additional history obtained from electronic medical record, nursing facility staff External records from outside source obtained and reviewed including please see HPI   Co morbidities that complicate the patient evaluation  Metastatic prostate cancer, paraplegia  Social Determinants of Health:  Paraplegia    Lab Tests:  I Ordered, and personally interpreted labs.  The pertinent results include: CBC shows normocytic anemia hemoglobin 9.8 at baseline, BMP shows a glucose of 108 BUN of 58 at baseline for patient's.  First troponin is 24   Imaging Studies ordered:  I ordered imaging studies including chest x-ray I independently visualized and interpreted imaging which showed shows small left pleural effusion with airspace disease may be be atelectasis versus pneumonia, left rib lesion I agree with the radiologist interpretation   Cardiac Monitoring:  The patient was maintained on a cardiac monitor.  I personally viewed and interpreted the cardiac monitored which showed an underlying rhythm of: EKG sinus without signs of ischemia   Medicines ordered and prescription drug management:  I ordered medication including Dilaudid for pain management I have reviewed the patients home medicines and have made adjustments as needed   Reevaluation:  On arrival patient was requesting pain medication, as well as food, he was provided both, he was found resting comfortably has no complaints  Patient's nursing facility called me back note that they have the patient's pain medication, and will provide him if he is discharged  tonight.   Rule out I have low suspicion for ACS as history is atypical, patient  has no cardiac history, EKG was sinus rhythm without signs of ischemia, patient has a downtrending troponin.  It is noted to be elevated but this appears to be his baseline.  I have very low suspicion for ACS patient has no pain at this time, pain is only elicited with movement by staff moving him, he did not become diaphoretic no shortness of breath no nausea or vomiting later dizziness..  Low suspicion for PE as patient denies pleuritic chest pain, shortness of breath, patient denies leg pain, no pedal edema noted on exam, vital signs reassuring nontachypneic, nonhypoxic, nontachycardic.  Low suspicion for AAA or aortic dissection as history is atypical, patient has low risk factors.  Low suspicion for systemic infection as patient is nontoxic-appearing, vital signs reassuring, no obvious source infection noted on exam.     Dispostion and problem list  After consideration of the diagnostic results and the patients response to treatment, I feel that the patent would benefit from discharge.  Pain since resolved-likely acute on chronic, patient will have his pain medication at the nursing facility,-continue with home medication, given strict return precautions.            Final Clinical Impression(s) / ED Diagnoses Final diagnoses:  Chronic pain due to neoplasm    Rx / DC Orders ED Discharge Orders     None         Aron Baba 05/31/21 1925    Godfrey Pick, MD 06/01/21 [phone]

## 2021-05-31 NOTE — Discharge Instructions (Signed)
Lab work imaging all reassuring, please continue all home medication as prescribed.  Please follow-up with PCP as needed.  Come back to the emergency department if you develop chest pain, shortness of breath, severe abdominal pain, uncontrolled nausea, vomiting, diarrhea.

## 2021-05-31 NOTE — ED Notes (Signed)
Dietary called for meal tray

## 2021-05-31 NOTE — ED Notes (Signed)
Family called reporting patient had told us he was soiled and we had not cleaned him up, questioned patient and patient reports that was [place] and that he is not soiled at this time.  Patient received meal tray and is eating at this time.

## 2021-05-31 NOTE — ED Notes (Signed)
[employer] and Reported that patients meds have arrived to facility.

## 2021-05-31 NOTE — ED Triage Notes (Addendum)
From [facility] due to not getting pain meds at SNF bc it has not come in.  Patient has prostate cancer that has metastasized and take methadone dilaudid and percocet other narcotics.  Patient also complaining of chest pain in epigastric region due to having no pain meds.  Patient in no acute distress upon arrival. Patient has only received tylenol per EMS.

## 2021-05-31 NOTE — ED Notes (Signed)
Patient playing on cell phone while RN getting vital signs

## 2021-05-31 NOTE — ED Notes (Addendum)
Patient Called sister to bring him food bc he is hungry

## 2021-06-01 ENCOUNTER — Encounter (HOSPITAL_COMMUNITY): Payer: Self-pay

## 2021-06-01 ENCOUNTER — Other Ambulatory Visit: Payer: Self-pay

## 2021-06-01 ENCOUNTER — Emergency Department (HOSPITAL_COMMUNITY)
Admission: EM | Admit: 2021-06-01 | Discharge: 2021-06-01 | Disposition: A | Discharge status: Skilled Nursing Facility | Payer: Medicare HMO | Attending: Emergency Medicine | Admitting: Emergency Medicine

## 2021-06-01 DIAGNOSIS — C7951 Secondary malignant neoplasm of bone: Secondary | ICD-10-CM | POA: Insufficient documentation

## 2021-06-01 DIAGNOSIS — C61 Malignant neoplasm of prostate: Secondary | ICD-10-CM | POA: Insufficient documentation

## 2021-06-01 MED ORDER — HYDROMORPHONE HCL 2 MG/ML IJ SOLN
2.0000 mg | Freq: Once | INTRAMUSCULAR | Status: DC
  Filled 2021-06-01: qty 1

## 2021-06-01 MED ORDER — HYDROMORPHONE HCL 2 MG PO TABS
4.0000 mg | ORAL_TABLET | Freq: Once | ORAL | Status: AC
  Administered 2021-06-01: 4 mg via ORAL
  Filled 2021-06-01: qty 2

## 2021-06-01 NOTE — ED Notes (Signed)
Spoke with nurse Elmyra Ricks at [facility] and spoke with admissions coordinator Irven Shelling.  Informed them that spoke no longer wished to remain at their facility and Irven Shelling said that  she would get their social worker involved to try to get pt moved to a different facility.

## 2021-06-01 NOTE — ED Triage Notes (Signed)
Pt brought in by ems from [facility] for pain all over.  Pt was seen here yesterday.  Last radiation tx was on Friday.  Pt has prostate ca.  VSS by ems.  Pt is alert and oriented.  Resp even and unlabored.  Skin warm and dry.  nad

## 2021-06-01 NOTE — ED Notes (Signed)
@  7.36 put pt on cardiac monitor

## 2021-06-01 NOTE — ED Notes (Signed)
Pt daughter given update, verbalized understanding.

## 2021-06-01 NOTE — ED Notes (Signed)
Pt called 911 from the room and told them he did not want to go back to the nursing facility. Informed CCOM we did not need assistance at this time. Informed pt social work was working on finding him placement elsewhere however he would have to return to the facility at this time. Pt verbalized understanding.

## 2021-06-01 NOTE — ED Notes (Signed)
Dietary contacted for [place] sandwich.

## 2021-06-01 NOTE — ED Notes (Signed)
Got put a soda ok per RN DW

## 2021-06-01 NOTE — ED Provider Notes (Signed)
[facility] Provider Note   CSN: [id number] Arrival date & time: 06/01/21  4193     History  Chief Complaint  Patient presents with   Pain    Generalized    Randall Larsen. is a 66 y.o. male.  Patient has metastatic prostate cancer and was discharged from the hospital 2 days ago.  He is a palliative care patient and gets Dilaudid for pain medicine the also is getting radiation therapy.  He was supposed to follow-up with his oncologist in a week from discharge.  He was seen here in the emergency department yesterday with pain and was worked up thoroughly and was discharged back home to the nursing home  The history is provided by the patient and medical records. No language interpreter was used.  Weakness Severity:  Mild Onset quality:  Gradual Timing:  Constant Progression:  Unchanged Chronicity:  Recurrent Context: not alcohol use   Relieved by:  Nothing Worsened by:  Nothing Ineffective treatments:  None tried Associated symptoms: no abdominal pain, no chest pain, no cough, no diarrhea, no frequency, no headaches and no seizures       Home Medications Prior to Admission medications   Medication Sig Start Date End Date Taking? Authorizing Provider  acetaminophen (TYLENOL) 500 MG tablet Take 2 tablets (1,000 mg total) by mouth 3 (three) times daily. 05/30/21  Yes Shawna Clamp, MD  celecoxib (CELEBREX) 200 MG capsule Take 1 capsule (200 mg total) by mouth 2 (two) times daily. 05/30/21  Yes Shawna Clamp, MD  docusate sodium (COLACE) 100 MG capsule Take 1 capsule (100 mg total) by mouth every 12 (twelve) hours. 04/02/21  Yes Drenda Freeze, MD  furosemide (LASIX) 40 MG tablet Take 1 tablet (40 mg total) by mouth daily. 05/30/21 05/30/22 Yes Shawna Clamp, MD  gabapentin (NEURONTIN) 300 MG capsule Take 1 capsule (300 mg total) by mouth 2 (two) times daily. 05/30/21  Yes Shawna Clamp, MD  hydrALAZINE (APRESOLINE) 50 MG tablet Take 1 tablet (50 mg  total) by mouth 3 (three) times daily. 02/08/21  Yes Carlisle Cater, PA-C  HYDROmorphone (DILAUDID) 4 MG tablet Take 1-1.5 tablets (4-6 mg total) by mouth every 3 (three) hours as needed for severe pain or moderate pain (for breakthrough pain). 05/30/21  Yes Pickenpack-Cousar, Athena N, NP  lidocaine (LIDODERM) 5 % Place 1 patch onto the skin daily. Remove & Discard patch within 12 hours or as directed by MD 04/26/21  Yes Pickenpack-Cousar, Carlena Sax, NP  LORazepam (ATIVAN) 0.5 MG tablet Take 1 tablet (0.5 mg total) by mouth 2 (two) times daily as needed for anxiety. 05/30/21  Yes Shawna Clamp, MD  methadone (DOLOPHINE) 10 MG tablet Take 2 tablets (20 mg total) by mouth every 8 (eight) hours. 05/30/21  Yes Pickenpack-Cousar, Carlena Sax, NP  methocarbamol (ROBAXIN) 500 MG tablet Take 1 tablet (500 mg total) by mouth 3 (three) times daily as needed for muscle spasms. 05/30/21  Yes Shawna Clamp, MD  metoprolol tartrate (LOPRESSOR) 25 MG tablet Take 1 tablet (25 mg total) by mouth 2 (two) times daily. 05/30/21  Yes Shawna Clamp, MD  Multiple Vitamin (MULTIVITAMIN WITH MINERALS) TABS tablet Take 1 tablet by mouth daily.   Yes [provider]  ondansetron (ZOFRAN) 4 MG tablet Take 1 tablet (4 mg total) by mouth every 8 (eight) hours as needed for nausea or vomiting. 04/26/21  Yes Pickenpack-Cousar, Carlena Sax, NP  oxyCODONE (OXYCONTIN) 10 mg 12 hr tablet Take 10 mg by mouth every  12 (twelve) hours.   Yes [provider]  oxyCODONE-acetaminophen (PERCOCET/ROXICET) 5-325 MG tablet Take 1 tablet by mouth every 4 (four) hours as needed for severe pain.   Yes [provider]  pantoprazole (PROTONIX) 40 MG tablet Take 1 tablet (40 mg total) by mouth 2 (two) times daily. 05/30/21  Yes Shawna Clamp, MD  polyethylene glycol (MIRALAX / GLYCOLAX) 17 g packet Take 17 g by mouth 2 (two) times daily. 04/02/21   Drenda Freeze, MD      Allergies    Patient has no known allergies.    Review of  Systems   Review of Systems  Constitutional:  Negative for appetite change and fatigue.  HENT:  Negative for congestion, ear discharge and sinus pressure.   Eyes:  Negative for discharge.  Respiratory:  Negative for cough.   Cardiovascular:  Negative for chest pain.  Gastrointestinal:  Negative for abdominal pain and diarrhea.  Genitourinary:  Negative for frequency and hematuria.  Musculoskeletal:  Negative for back pain.  Skin:  Negative for rash.  Neurological:  Positive for weakness. Negative for seizures and headaches.  Psychiatric/Behavioral:  Negative for hallucinations.    Physical Exam Updated Vital Signs BP 121/73    Pulse 90    Temp 98 F (36.7 C)    Resp 18    Ht 5\' 10"  (1.778 m)    Wt 103.1 kg    SpO2 98%    BMI 32.61 kg/m  Physical Exam Vitals and nursing note reviewed.  Constitutional:      Appearance: He is well-developed.  HENT:     Head: Normocephalic.     Nose: Nose normal.  Eyes:     General: No scleral icterus.    Conjunctiva/sclera: Conjunctivae normal.  Neck:     Thyroid: No thyromegaly.  Cardiovascular:     Rate and Rhythm: Normal rate and regular rhythm.     Heart sounds: No murmur heard.   No friction rub. No gallop.  Pulmonary:     Breath sounds: No stridor. No wheezing or rales.  Chest:     Chest wall: No tenderness.  Abdominal:     General: There is no distension.     Tenderness: There is no abdominal tenderness. There is no rebound.  Musculoskeletal:        General: No swelling.     Cervical back: Neck supple.  Lymphadenopathy:     Cervical: No cervical adenopathy.  Skin:    Findings: No erythema or rash.  Neurological:     Mental Status: He is alert and oriented to person, place, and time.     Motor: No abnormal muscle tone.     Coordination: Coordination normal.  Psychiatric:        Behavior: Behavior normal.    ED Results / Procedures / Treatments   Labs (all labs ordered are listed, but only abnormal results are  displayed) Labs Reviewed - No data to display  EKG None  Radiology DG Chest 1 View  Result Date: 05/31/2021 CLINICAL DATA:  Chest pain EXAM: CHEST  1 VIEW COMPARISON:  05/05/2021, CT 05/01/2021 FINDINGS: Right lung is grossly clear. Small left-sided effusion with airspace disease at the left base. Left fifth rib lesion with adjacent pleural disease. No pneumothorax IMPRESSION: 1. Small left-sided effusion with airspace disease at the left base which may be due to atelectasis or pneumonia 2. Destructive left fifth rib lesion with adjacent pleural disease. Electronically Signed   By: Madie Reno.D.  On: 05/31/2021 17:15    Procedures Procedures    Medications Ordered in ED Medications  HYDROmorphone (DILAUDID) injection 2 mg (has no administration in time range)    ED Course/ Medical Decision Making/ A&P                           Medical Decision Making Risk Prescription drug management.   Patient with metastatic prostate cancer and pain related to that.  Patient had a full work-up yesterday and has returned today with continued discomfort.  He is given a shot of Dilaudid and will be sent back to the nursing home to continue his treatment through his oncologist and his radiation treatment    This patient presents to the ED for concern of pain, this involves an extensive number of treatment options, and is a complaint that carries with it a high risk of complications and morbidity.  The differential diagnosis includes metastatic prostate cancer causing pain   Co morbidities that complicate the patient evaluation  Prostate cancer   Additional history obtained:  Additional history obtained from nursing home External records from outside source obtained and reviewed including hospital records   Lab Tests: None  Imaging Studies ordered: None  Cardiac Monitoring:  The patient was maintained on a cardiac monitor.  I personally viewed and interpreted the cardiac  monitored which showed an underlying rhythm of: Normal sinus rhythm   Medicines ordered and prescription drug management:  I ordered medication including Dilaudid for pain Reevaluation of the patient after these medicines showed that the patient improved I have reviewed the patients home medicines and have made adjustments as needed   Test Considered:  None   Critical Interventions:  None   Consultations Obtained:  No consult  Problem List / ED Course:  Metastatic prostate cancer  Reevaluation:  After the interventions noted above, I reevaluated the patient and found that they have :improved   Social Determinants of Health:  At nursing home   Dispostion:  After consideration of the diagnostic results and the patients response to treatment, I feel that the patent would benefit from discharge back to the nursing home and instruction to take your pain medicine as prescribed.         Final Clinical Impression(s) / ED Diagnoses Final diagnoses:  Prostate cancer metastatic to bone [facility])    Rx / DC Orders ED Discharge Orders     None         Milton Ferguson, MD 06/02/21 1643

## 2021-06-01 NOTE — Discharge Instructions (Addendum)
Continue with your pain medicine at the nursing home.  Follow-up with your cancer doctor and your family doctor as planned when you are discharged from the hospital 2 days ago.  Continue with your radiation treatment as scheduled

## 2021-06-05 ENCOUNTER — Telehealth: Payer: Self-pay | Admitting: Hematology and Oncology

## 2021-06-05 NOTE — Telephone Encounter (Signed)
Sch per 2/27 inbasket, left msg with care facility.

## 2021-06-07 ENCOUNTER — Telehealth: Payer: Self-pay

## 2021-06-07 NOTE — Telephone Encounter (Signed)
[name] called our office to inform us of his current situation at [facility]. He expresses dissatisfaction with the facility and medication administration schedule.  He also shared that his roommate passed away and is concerned about the conditions he is living in.  ?He stated that he was taken outside yesterday in a wheelchair for an hour. He stated the facility needs clearance from our facility to get him up in bed. Education provided that he is at this facility to be rehabilitated as requested. Advised that the facility should not need an order from our team to move him. He does acknowledge that he has worked with therapy.  ?States he would like to be transferred to a different facility. Educated patient that we are not able to facilitate a transfer and recommended he speak to nursing and social work team at facility.  ?Reminded patient of appointments with our facility on March 8th. Patient was unaware but looking forward to meeting with [place]. Notified Nikki, NP and our team will follow up with facility to make sure they are aware of the appointment.  ? ?

## 2021-06-14 ENCOUNTER — Other Ambulatory Visit: Payer: Self-pay

## 2021-06-14 ENCOUNTER — Inpatient Hospital Stay: Payer: Medicare HMO | Attending: Physician Assistant

## 2021-06-14 ENCOUNTER — Inpatient Hospital Stay (HOSPITAL_BASED_OUTPATIENT_CLINIC_OR_DEPARTMENT_OTHER): Payer: Medicare HMO | Admitting: Physician Assistant

## 2021-06-14 ENCOUNTER — Inpatient Hospital Stay: Payer: Medicare HMO | Admitting: Nurse Practitioner

## 2021-06-14 VITALS — BP 109/52 | HR 85 | Temp 97.5°F | Resp 17

## 2021-06-14 DIAGNOSIS — C61 Malignant neoplasm of prostate: Secondary | ICD-10-CM | POA: Diagnosis not present

## 2021-06-14 DIAGNOSIS — C7951 Secondary malignant neoplasm of bone: Secondary | ICD-10-CM | POA: Insufficient documentation

## 2021-06-14 DIAGNOSIS — F17210 Nicotine dependence, cigarettes, uncomplicated: Secondary | ICD-10-CM | POA: Diagnosis not present

## 2021-06-14 DIAGNOSIS — D649 Anemia, unspecified: Secondary | ICD-10-CM | POA: Diagnosis not present

## 2021-06-14 DIAGNOSIS — I10 Essential (primary) hypertension: Secondary | ICD-10-CM | POA: Insufficient documentation

## 2021-06-14 LAB — CBC WITH DIFFERENTIAL (CANCER CENTER ONLY)
Abs Immature Granulocytes: 0.57 10*3/uL — ABNORMAL HIGH (ref 0.00–0.07)
Basophils Absolute: 0 10*3/uL (ref 0.0–0.1)
Basophils Relative: 0 %
Eosinophils Absolute: 0.1 10*3/uL (ref 0.0–0.5)
Eosinophils Relative: 1 %
HCT: 24 % — ABNORMAL LOW (ref 39.0–52.0)
Hemoglobin: 7.7 g/dL — ABNORMAL LOW (ref 13.0–17.0)
Immature Granulocytes: 11 %
Lymphocytes Relative: 6 %
Lymphs Abs: 0.3 10*3/uL — ABNORMAL LOW (ref 0.7–4.0)
MCH: 30.7 pg (ref 26.0–34.0)
MCHC: 32.1 g/dL (ref 30.0–36.0)
MCV: 95.6 fL (ref 80.0–100.0)
Monocytes Absolute: 0.4 10*3/uL (ref 0.1–1.0)
Monocytes Relative: 7 %
Neutro Abs: 4.1 10*3/uL (ref 1.7–7.7)
Neutrophils Relative %: 75 %
Platelet Count: 154 10*3/uL (ref 150–400)
RBC: 2.51 MIL/uL — ABNORMAL LOW (ref 4.22–5.81)
RDW: 21.3 % — ABNORMAL HIGH (ref 11.5–15.5)
Smear Review: NORMAL
WBC Count: 5.4 10*3/uL (ref 4.0–10.5)
nRBC: 1.7 % — ABNORMAL HIGH (ref 0.0–0.2)

## 2021-06-14 LAB — CMP (CANCER CENTER ONLY)
ALT: 14 U/L (ref 0–44)
AST: 14 U/L — ABNORMAL LOW (ref 15–41)
Albumin: 2.6 g/dL — ABNORMAL LOW (ref 3.5–5.0)
Alkaline Phosphatase: 236 U/L — ABNORMAL HIGH (ref 38–126)
Anion gap: 8 (ref 5–15)
BUN: 26 mg/dL — ABNORMAL HIGH (ref 8–23)
CO2: 23 mmol/L (ref 22–32)
Calcium: 7.8 mg/dL — ABNORMAL LOW (ref 8.9–10.3)
Chloride: 107 mmol/L (ref 98–111)
Creatinine: 1.05 mg/dL (ref 0.61–1.24)
GFR, Estimated: >60 mL/min (ref >60)
Glucose, Bld: 119 mg/dL — ABNORMAL HIGH (ref 70–99)
Potassium: 4.5 mmol/L (ref 3.5–5.1)
Sodium: 138 mmol/L (ref 135–145)
Total Bilirubin: 0.5 mg/dL (ref 0.3–1.2)
Total Protein: 5.4 g/dL — ABNORMAL LOW (ref 6.5–8.1)

## 2021-06-15 ENCOUNTER — Telehealth: Payer: Self-pay

## 2021-06-15 ENCOUNTER — Other Ambulatory Visit: Payer: Self-pay | Admitting: Hematology and Oncology

## 2021-06-15 ENCOUNTER — Telehealth: Payer: Self-pay | Admitting: Pharmacist

## 2021-06-15 ENCOUNTER — Encounter: Payer: Self-pay | Admitting: Physician Assistant

## 2021-06-15 ENCOUNTER — Other Ambulatory Visit (HOSPITAL_COMMUNITY): Payer: Self-pay

## 2021-06-15 DIAGNOSIS — D649 Anemia, unspecified: Secondary | ICD-10-CM | POA: Insufficient documentation

## 2021-06-15 LAB — TESTOSTERONE: Testosterone: 8 ng/dL — ABNORMAL LOW (ref 264–916)

## 2021-06-15 LAB — PROSTATE-SPECIFIC AG, SERUM (LABCORP): Prostate Specific Ag, Serum: 194 ng/mL — ABNORMAL HIGH (ref 0.0–4.0)

## 2021-06-15 MED ORDER — ABIRATERONE ACETATE 250 MG PO TABS
1000.0000 mg | ORAL_TABLET | Freq: Every day | ORAL | 2 refills | Status: DC
Start: 2021-06-15 — End: 2021-07-04
  Filled 2021-06-15 – 2021-06-16 (×2): qty 120, 30d supply, fill #0

## 2021-06-15 MED ORDER — PREDNISONE 5 MG PO TABS
5.0000 mg | ORAL_TABLET | Freq: Every day | ORAL | 1 refills | Status: DC
  Filled 2021-06-15: qty 90, 90d supply, fill #0
  Filled 2021-06-16: qty 30, 30d supply, fill #0

## 2021-06-15 NOTE — Progress Notes (Signed)
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 Patient declined intervention at that time.  02/13/2021: establish care with Randall Larsen. PSA 494. Started bicalutamide 50 mg PO daily.  02/23/2021: NM bone scan and CT abdomen pelvis show multifocal osseous metastatic disease. CT scan showed enlarged heterogeneous prostate gland concern for diffuse tumor involvement. Additionally noted to have bilateral iliac and perirectal metastatic adenopathy.  04/17/2021: lupron 22.'5mg'$  IM, initial dose.  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  05/28/2021: Completed palliative radiation to lumbar spine, right hip/pelvis, sternum, left rib and T3-T9 spine.   Current Treatment: -Lupron 22.5 mg IM q 3 months. Next dose due in April 2023.  -Plan to start abiraterone 1000 mg PO daily   Interval History:  Randall Larsen. 66 y.o. male with medical history significant for metastatic prostate cancer who presents for a follow up visit. The patient's last visit was on 04/17/2021. In the interim since the last visit , he was hospitalized for uncontrolled cancer pain and paralysis of lower extremities due to cord compression of T4-T8.   On exam today Randall Larsen reports that he is currently residing [facility]. He reports that his pain is well controlled at this time with his current pain regimen. He has intermittent episodes of sternal chest pain. He is fatigued but tries to sit up for most of the day. He denies nausea, vomiting or abdominal pain.  He has a foley catheter for urination. He voiced frustration on no having the sensation of having a bowel movement. He is unaware of any bleeding. He denies fevers, chills, night sweats, shortness of breath or cough. He has no other complaints.  A full 10 point ROS is listed below.  MEDICAL HISTORY:  Past Medical History:  Diagnosis Date   Hypertension    Prostate cancer ([place])     SURGICAL HISTORY: No past surgical history on file.  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  ALLERGIES:  has No Known Allergies.  MEDICATIONS:  Current Outpatient Medications  Medication Sig Dispense Refill   acetaminophen (TYLENOL) 500 MG tablet Take 2 tablets (1,000 mg total) by mouth 3 (three) times daily. 30 tablet 0   celecoxib (CELEBREX) 200 MG capsule Take 1 capsule (200 mg total) by mouth 2 (two) times daily. 60 capsule 1   docusate sodium (COLACE) 100 MG capsule Take 1 capsule (100 mg total) by mouth every 12 (twelve) hours. 60 capsule 0   furosemide (LASIX) 40 MG  tablet Take 1 tablet (40 mg total) by mouth daily. 30 tablet 1   gabapentin (NEURONTIN) 300 MG capsule Take 1 capsule (300 mg total) by mouth 2 (two) times daily. 60 capsule 0   hydrALAZINE (APRESOLINE) 50 MG tablet Take 1 tablet (50 mg total) by mouth 3 (three) times daily. 90 tablet 2   HYDROmorphone (DILAUDID) 4 MG tablet Take 1-1.5 tablets (4-6 mg total) by mouth every 3 (three) hours as needed for severe pain or moderate pain (for breakthrough pain). (Patient taking differently: Take 4-6 mg by mouth every 3 (three) hours as needed for severe pain or moderate pain (for breakthrough pain). For pain scale of 6-10  1.5 tablet PO every 3 hrs) 90 tablet 0   lidocaine (LIDODERM) 5 % Place 1 patch onto the skin daily. Remove & Discard patch within 12 hours or as directed by MD 30 patch 0   LORazepam (ATIVAN) 0.5 MG tablet Take 1 tablet (0.5 mg total) by mouth 2 (two) times daily as needed for anxiety. 10 tablet 0   methadone (DOLOPHINE) 10 MG tablet Take 2 tablets (20 mg total) by mouth every 8 (eight) hours. 90 tablet 0   methocarbamol (ROBAXIN) 500 MG tablet Take 1 tablet (500 mg total) by mouth 3 (three) times daily as needed for muscle spasms. 60 tablet 1   metoprolol tartrate (LOPRESSOR) 25 MG tablet Take 1 tablet (25 mg total) by mouth 2 (two) times daily. 30 tablet 1   Multiple Vitamin (MULTIVITAMIN WITH MINERALS) TABS tablet Take 1 tablet by mouth daily.     ondansetron (ZOFRAN) 4 MG tablet Take 1 tablet (4 mg total) by mouth every 8 (eight) hours as needed for nausea or vomiting. 90 tablet 0   pantoprazole (PROTONIX) 40 MG tablet Take 1 tablet (40 mg total) by mouth 2 (two) times daily. 60 tablet 0   polyethylene glycol (MIRALAX / GLYCOLAX) 17 g packet Take 17 g by mouth 2 (two) times daily. 14 each 0   vitamin C (ASCORBIC ACID) 500 MG tablet Take 500 mg by mouth daily.     abiraterone acetate (ZYTIGA) 250 MG tablet Take 4 tablets (1,000 mg total) by mouth daily. Take on an empty stomach 1 hour  before or 2 hours after a meal 120 tablet 2   oxyCODONE (OXYCONTIN) 10 mg 12 hr tablet Take 10 mg by mouth every 12 (twelve) hours. (Patient not taking: Reported on 06/14/2021)     oxyCODONE-acetaminophen (PERCOCET/ROXICET) 5-325 MG tablet Take 1 tablet by mouth every 4 (four) hours as needed for severe pain. (Patient not taking: Reported on 06/14/2021)     predniSONE (DELTASONE) 5 MG tablet Take 1 tablet (5 mg total) by mouth daily with breakfast. 90 tablet 1   No current facility-administered medications for this visit.    REVIEW OF SYSTEMS:   Constitutional: ( - ) fevers, ( - )  chills , ( - ) night sweats Eyes: ( - ) blurriness of vision, ( - ) double vision, ( - )  watery eyes Ears, nose, mouth, throat, and face: ( - ) mucositis, ( - ) sore throat Respiratory: ( - ) cough, ( - ) dyspnea, ( - ) wheezes Cardiovascular: ( - ) palpitation, ( - ) chest discomfort, ( + ) lower extremity swelling Gastrointestinal:  ( - ) nausea, ( - ) heartburn, ( - ) change in bowel habits Skin: ( - ) abnormal skin rashes Lymphatics: ( - ) new lymphadenopathy, ( - ) easy bruising Neurological: ( - ) numbness, ( - ) tingling, ( - ) new weaknesses Behavioral/Psych: ( - ) mood change, ( - ) new changes  All other systems were reviewed with the patient and are negative.  PHYSICAL EXAMINATION:  Vitals:   06/14/21 1138  BP: (!) 109/52  Pulse: 85  Resp: 17  Temp: (!) 97.5 F (36.4 C)  SpO2: 96%   There were no vitals filed for this visit.   GENERAL: chronically ill appearing elderly African American male, alert, no distress and comfortable SKIN: skin color, texture, turgor are normal, no rashes or significant lesions EYES: conjunctiva are pink and non-injected, sclera clear LUNGS: clear to auscultation and percussion with normal breathing effort HEART: regular rate & rhythm and no murmurs and no lower extremity edema Musculoskeletal: no cyanosis of digits and no clubbing  PSYCH: alert & oriented x 3,  fluent speech NEURO: no focal motor/sensory deficits  LABORATORY DATA:  I have reviewed the data as listed CBC Latest Ref Rng & Units 06/14/2021 05/31/2021 05/29/2021  WBC 4.0 - 10.5 K/uL 5.4 6.5 7.9  Hemoglobin 13.0 - 17.0 g/dL 7.7(L) 9.8(L) 10.2(L)  Hematocrit 39.0 - 52.0 % 24.0(L) 29.5(L) 31.6(L)  Platelets 150 - 400 K/uL 154 122(L) 134(L)    CMP Latest Ref Rng & Units 06/14/2021 05/31/2021 05/29/2021  Glucose 70 - 99 mg/dL 119(H) 108(H) 154(H)  BUN 8 - 23 mg/dL 26(H) 58(H) 55(H)  Creatinine 0.61 - 1.24 mg/dL 1.05 1.21 1.07  Sodium 135 - 145 mmol/L 138 136 134(L)  Potassium 3.5 - 5.1 mmol/L 4.5 3.7 4.5  Chloride 98 - 111 mmol/L 107 105 102  CO2 22 - 32 mmol/L '23 23 23  '$ Calcium 8.9 - 10.3 mg/dL 7.8(L) 8.0(L) 8.4(L)  Total Protein 6.5 - 8.1 g/dL 5.4(L) - 5.4(L)  Total Bilirubin 0.3 - 1.2 mg/dL 0.5 - 0.5  Alkaline Phos 38 - 126 U/L 236(H) - 398(H)  AST 15 - 41 U/L 14(L) - 55(H)  ALT 0 - 44 U/L 14 - 58(H)    Lab Results  Component Value Date   MPROTEIN Not Observed 02/13/2021   Lab Results  Component Value Date   KPAFRELGTCHN 27.9 (H) 02/13/2021   LAMBDASER 16.1 02/13/2021   KAPLAMBRATIO 1.73 (H) 02/13/2021    RADIOGRAPHIC STUDIES: DG Chest 1 View  Result Date: 05/31/2021 CLINICAL DATA:  Chest pain EXAM: CHEST  1 VIEW COMPARISON:  05/05/2021, CT 05/01/2021 FINDINGS: Right lung is grossly clear. Small left-sided effusion with airspace disease at the left base. Left fifth rib lesion with adjacent pleural disease. No pneumothorax IMPRESSION: 1. Small left-sided effusion with airspace disease at the left base which may be due to atelectasis or pneumonia 2. Destructive left fifth rib lesion with adjacent pleural disease. Electronically Signed   By: Donavan Foil M.D.   On: 05/31/2021 17:15   MR THORACIC SPINE WO CONTRAST  Result Date: 05/20/2021 CLINICAL DATA:  Initial evaluation for acute mid back pain, history of metastatic prostate cancer. EXAM: MRI THORACIC SPINE WITHOUT CONTRAST  TECHNIQUE: Multiplanar, multisequence  MR imaging of the thoracic spine was performed. No intravenous contrast was administered. COMPARISON:  CT from 02/05/2021. FINDINGS: Alignment:  Examination degraded by motion artifact. Mild dextroscoliosis. Alignment otherwise normal with preservation of the normal thoracic kyphosis. No listhesis. Vertebrae: Innumerable osseous metastases seen throughout the thoracic spine, consistent with history of metastatic prostate cancer. There is involvement of essentially all levels, with diffuse involvement of the visualized ribs. Since previous exam, there is progressive height loss at the T2, T3, T4, T5, T8, T9, T10, T11, and T12 vertebral bodies, consistent with interval pathologic compression fractures. Associated bony retropulsion and/or abnormal convex posterior contour now seen at multiple levels, most pronounced at T2, T3, T4, T6, and T8. Cord: Grossly normal signal and morphology. No appreciable cord signal changes on this motion degraded exam. Paraspinal and other soft tissues: Mild edema seen throughout the upper and mid paraspinous soft tissues, presumably reactive. No collections. Small layering bilateral pleural effusions, left greater than right. Undo that Disc levels: No significant disc pathology seen within the thoracic spine. However, the bony retropulsion and/or posterior convex bowing seen at multiple levels extending from T2 through T9, in combination with prominence of the dorsal epidural fat results in mild to moderate diffuse spinal stenosis, most pronounced at T2 through T4 and T8. Mild flattening of the thoracic cord at several levels, most pronounced at T4 and T8, but no visible cord signal changes. Mild to moderate bilateral bony foraminal narrowing present at T1-2 through T5-6, most pronounced at T4-5 bilaterally. Otherwise, no foramina remain patent. IMPRESSION: 1. Innumerable osseous metastases throughout the thoracic spine, consistent with history of  metastatic prostate cancer. Progressive height loss at the T2, T3, T4, T5, T8, T9, T10, T11, and T12 vertebral bodies, consistent with interval pathologic compression fractures. Associated bony retropulsion and/or abnormal convex posterior contour now seen at multiple levels, most pronounced at T2 through T8. 2. Mild to moderate diffuse spinal stenosis extending from T2 through T9, most pronounced at T2 through T4 and T8. 3. Mild to moderate bilateral bony foraminal narrowing at T1-2 through T5-6, most pronounced at T4-5. 4. Small layering bilateral pleural effusions, left greater than right. Electronically Signed   By: Jeannine Boga M.D.   On: 05/20/2021 19:14   MR LUMBAR SPINE WO CONTRAST  Result Date: 05/20/2021 CLINICAL DATA:  Low back pain, cauda equina syndrome suspected. Weakness and numbness in the lower extremities. Metastatic prostate cancer. EXAM: MRI LUMBAR SPINE WITHOUT CONTRAST TECHNIQUE: Multiplanar, multisequence MR imaging of the lumbar spine was performed. No intravenous contrast was administered. COMPARISON:  Lumbar spine CT 02/05/2021. CT abdomen and pelvis 04/02/2021. FINDINGS: The patient terminated the examination prior to completion. A complete noncontrast study was obtained, however no IV contrast was administered. Segmentation: Standard. Alignment:  Normal. Vertebrae: Diffusely abnormal, heterogeneous marrow signal throughout the lumbar spine, included lower thoracic spine, and pelvis corresponding to known widespread metastases. Mild L1 superior endplate compression fracture with 15% anterior vertebral body height loss which is new from 04/02/2021 L2 compression fracture with 25% height loss asymmetric to the right, also new. Multiple small Schmorl's nodes in the lumbar and lower thoracic spine. No evidence of epidural tumor on this unenhanced study. Conus medullaris and cauda equina: Conus extends to the L1-2 level. Conus and cauda equina appear normal. Paraspinal and other  soft tissues: Partially visualized moderate bladder distension. Disc levels: T12-L1: Negative. L1-2: Mild left facet hypertrophy without disc herniation or stenosis. L2-3: Minimal disc bulging and mild facet hypertrophy without stenosis. L3-4: Mild facet hypertrophy  without stenosis. L4-5: Disc bulging and severe left and moderate right facet hypertrophy result in mild spinal stenosis, mild bilateral lateral recess stenosis, and mild left neural foraminal stenosis, similar to the 02/05/2021 CT. L5-S1: Mild right and moderate left facet hypertrophy without disc herniation or stenosis. IMPRESSION: 1. Known widespread osseous metastases. No evidence of epidural tumor on this unenhanced study. 2. Mild L1 and L2 compression fractures, new from 04/02/2021. 3. Unchanged mild multifactorial spinal stenosis at L4-5. Electronically Signed   By: Logan Bores M.D.   On: 05/20/2021 13:26   DG HIPS BILAT WITH PELVIS 2V  Result Date: 05/20/2021 CLINICAL DATA:  66 year old male with history of trauma from a fall with bilateral hip pain. History of prostate cancer. EXAM: DG HIP (WITH OR WITHOUT PELVIS) 2V BILAT COMPARISON:  No priors. FINDINGS: Five views of the bony pelvis and bilateral hips demonstrate no acute displaced fracture, subluxation or dislocation. There is joint space narrowing, subchondral sclerosis and osteophyte formation associated with both hip joints, indicative of moderate osteoarthritis. Innumerable poorly defined sclerotic areas are noted throughout the visualized portions of the axial and appendicular skeleton, indicative of widespread metastatic disease to the bones. IMPRESSION: 1. No acute radiographic abnormality of the bony pelvis. 2. Widespread metastatic disease to the bones. 3. Moderate bilateral hip joint osteoarthritis. Electronically Signed   By: Vinnie Langton M.D.   On: 05/20/2021 08:07    ASSESSMENT & PLAN Donterius Filley. 66 y.o. male with medical history significant for metastatic  prostate cancer who presents for a follow up visit.   After review the labs, review the records, discussion with the patient the findings are most consistent with metastatic prostate cancer with spread to the bones.  The patient has a markedly elevated PSA at 494.  Given the clinical picture biopsy is not required at this time.  We begin treatment with bicalutamide 50 mg p.o. daily with the intention of starting Lupron therapy 2 weeks after initiation of treatment.  Unfortunate the patient no showed for the scheduled injections and has continued on bicalutamide until we were able to administer Lupron.  His first Lupron injection was administered on 04/17/2021.    He developed flaccid paralysis of his lower extremities on 05/20/2021. MRI confirmed significant cord compression noted from T4-T8. Neurosurgery did not recommendation surgery. On 05/28/2021, he completed palliative radiation to lumbar spine, right hip/pelvis, sternum, left rib and T3-T9 spine.   # Metastatic Prostate Cancer # Metastatic Spread to Bones --patient started on bicalutamide '50mg'$  PO daily with intention of starting lupron after 2 weeks of treatment. This started on 02/13/2021, but patient did not show for lupron injection. Bicalutamide was discontinued during hospitalization in 05/2021.  --started lupron 22.'5mg'$  injection on 04/17/2021, continue q 3 months. Next due on 07/2021.  --recommend to start abiraterone 1000 mg PO daily. Sent prescription today.  --RTC in 4 weeks to re-evaluate.   # Pain Control, well controlled: --Under the care of palliative care for pain regimen which includes dilaudid, methadone and lidocaine patch.  --Received palliative radiation to bone metastases completed on 05/28/2021.   #Anemia: --hemoglobin dropped from 9.8 to 7.7 in two weeks --etiology unknown.  --patient refused a blood transfusion today --need to monitor labs closely. Advise to repeat labs in one week and check iron, vitamin B12 and folate  levels.   No orders of the defined types were placed in this encounter.   All questions were answered. The patient knows to call the clinic with any problems, questions or  concerns.  I have spent a total of 30 minutes minutes of face-to-face and non-face-to-face time, preparing to see the patient, performing a medically appropriate examination, counseling and educating the patient, ordering medications/tests/procedures,documenting clinical information in the electronic health record,  and care coordination.    Dede Query PA-C Dept of Hematology and [place] at [facility] Phone: [phone]   06/15/2021 3:30 PM

## 2021-06-15 NOTE — Telephone Encounter (Addendum)
Oral Oncology Pharmacist Encounter ? ?Received new prescription for Zytiga (abiraterone acetate) for the treatment of metastatic castration-sensitive prostate cancer in conjunction with Lupron and prednisone, planned duration until disease progression or unacceptable drug toxicity. ? ?CBC w/ Diff and CMP from 06/14/21 assessed, no baseline dose adjustments required. Prescription dose and frequency assessed for appropriateness. ? ?Current medication list in Epic reviewed, DDIs with Zytiga identified: ?Category C drug-drug interaction between Zytiga and [place] a moderate CYP2D6 inhibitor may increase serum concentrations of Lopressor. Recommend monitoring for hypotension, decreased heart rate. No change in therapy warranted at this time.   ? ?Evaluated chart and no patient barriers to medication adherence noted.  ? ?Prescription has been e-scribed to the [facility] for benefits analysis and approval. ? ?Oral Oncology Clinic will continue to follow for insurance authorization, copayment issues, initial counseling and start date. ? ?Leron Croak, PharmD, BCPS ?Hematology/Oncology Clinical Pharmacist ?[facility] Oral Chemotherapy Navigation Clinics ?[phone] ?06/15/2021 1:17 PM ? ?

## 2021-06-15 NOTE — Telephone Encounter (Signed)
Oral Oncology Patient Advocate Encounter ? ?After completing a benefits investigation, prior authorization for Randall Larsen is not required at this time through [facility]. ? ?Patient's copay is $4.15.   ? ? ?Wynn Maudlin CPHT ?Specialty Pharmacy Patient Advocate ?[place] ?Phone [phone] ?Fax [phone] ?06/15/2021 12:27 PM ? ?  ? ?

## 2021-06-16 ENCOUNTER — Other Ambulatory Visit (HOSPITAL_COMMUNITY): Payer: Self-pay

## 2021-06-16 ENCOUNTER — Encounter: Payer: Self-pay | Admitting: Physician Assistant

## 2021-06-16 ENCOUNTER — Telehealth: Payer: Self-pay

## 2021-06-16 ENCOUNTER — Other Ambulatory Visit: Payer: Self-pay | Admitting: Physician Assistant

## 2021-06-16 DIAGNOSIS — D649 Anemia, unspecified: Secondary | ICD-10-CM

## 2021-06-16 NOTE — Telephone Encounter (Signed)
Lab orders for 06/22/21 faxed to [facility]  [phone].  Confirmation received ? ?

## 2021-06-19 ENCOUNTER — Telehealth: Payer: Self-pay

## 2021-06-19 ENCOUNTER — Telehealth: Payer: Self-pay | Admitting: *Deleted

## 2021-06-19 ENCOUNTER — Other Ambulatory Visit (HOSPITAL_COMMUNITY): Payer: Self-pay

## 2021-06-19 NOTE — Telephone Encounter (Signed)
T/C today from Ms Clowers stating pt is to start his oral chemo this week and since he has a severe infection on his buttocks from radiation, she does not feel he should start the new chemo medication.  She also feels pt needs to be admitted due to the severity of the infection. ? ?Spoke with Sharyn Lull in [place] and she will reach out to Ms Clowers since this is a side effect of the radiation.  ?

## 2021-06-19 NOTE — Telephone Encounter (Signed)
Oral Chemotherapy Pharmacist Encounter  ? ?2nd attempt made to reach patient to provide update and offer for initial counseling on oral medication: Zytiga (abiraterone acetate).  ? ?No answer. Left voicemail for patient to call back to discuss details of medication. ? ?Medication will be shipped from the [facility] on 06/19/2021 to deliver to the [facility] on 06/20/2021. Anticipate start date of Zytiga and prednisone is 06/21/2021. ? ?I have spoken with the Director of nursing and nursing unit manger regarding both the Zytiga and prednisone. The nursing unit manager input the orders onto patient's MAR with the Zytiga timed at 6AM and prednisone timed at 8AM with breakfast.  ? ?Facility is aware that patient will be taking Zytiga '250mg'$  tablets, 4 tablets ('1000mg'$ ) by mouth once daily on an empty stomach, 1 hour before or 2 hours after a meal. Additionally I stressed the importance of patient also receiving the prednisone '5mg'$  tablet, 1 tablet by mouth one daily with breakfast. ? ?Facility expressed understanding. I provided my contact information as well to the nursing unit manager.  ? ?Leron Croak, PharmD, BCPS ?Hematology/Oncology Clinical Pharmacist ?[facility] Oral Chemotherapy Navigation Clinics ?[phone] ?06/19/2021 12:39 PM ? ? ? ? ?

## 2021-06-19 NOTE — Telephone Encounter (Addendum)
Returned call  to patient contact "Radio producer" (phone # [phone]). I verified her identity, and she states "Patient has severe bed sores on his buttocks that topical antibiotics are not helping to treat. They are worsening quickly w/ odor and she would like for him to be seen at the ER right away". I notified his doctor Dr. Tyler Pita, who would like for this patient to be seen at the ER for treatment immediately to prevent patient from becoming septic. Patient's address is [facility] [address], [place],  [address]. Nursing home phone- [phone] Room # A-7-1. [facility] not available due to the address of the patient -per [place], and Suezanne Jacquet w/ [facility]. Patient will be transported to the closest ER for his address "[facility]" - per EMS transport, set up through the 911 service operator. I have notified patient's contact Elisa Clowers.  ?

## 2021-06-19 NOTE — Telephone Encounter (Signed)
Received vm message from Altha Harm, DON @ [facility], requesting a call back. ?TCT Jonelle Sidle and spoke with her. She states that she has gotten a call from Rad. Onc here in response to a call from pt's ex-wife. Apparently this patient is going to be going to the [facility] to have a wound on his buttocks looked at. Per the previous phone call, Lanier Ensign said that this wound had a bad odor and that she thought it was infected. ?Jonelle Sidle states their wound care nurse has seen the area, believes it to be a "radiation burn" and that it is healing, not infected.. she is wondering if he can wait until the wound nurse sees him again tomorrow. Advised that this was arranged by Radiation Oncology. Out of an abundance of caution, it would be best to have the area evaluated. Jonelle Sidle understood but is concerned that transportation is difficult for Mr. Ponder even though he would be going via stretcher. ?Acknowedged her concern, She will let Mr. Hymas know that [facility] will be taking him to [facility] this afternoon. ?

## 2021-06-20 ENCOUNTER — Emergency Department (HOSPITAL_COMMUNITY): Payer: Medicare HMO

## 2021-06-20 ENCOUNTER — Encounter (HOSPITAL_COMMUNITY): Payer: Self-pay | Admitting: Radiology

## 2021-06-20 ENCOUNTER — Inpatient Hospital Stay (HOSPITAL_COMMUNITY)
Admission: EM | Admit: 2021-06-20 | Discharge: 2021-07-04 | DRG: 854 | Disposition: A | Discharge status: Hospice | Payer: Medicare HMO | Attending: Internal Medicine | Admitting: Internal Medicine

## 2021-06-20 ENCOUNTER — Encounter: Payer: Self-pay | Admitting: Nurse Practitioner

## 2021-06-20 DIAGNOSIS — E8809 Other disorders of plasma-protein metabolism, not elsewhere classified: Secondary | ICD-10-CM | POA: Diagnosis present

## 2021-06-20 DIAGNOSIS — Z515 Encounter for palliative care: Secondary | ICD-10-CM | POA: Diagnosis not present

## 2021-06-20 DIAGNOSIS — Z7189 Other specified counseling: Secondary | ICD-10-CM | POA: Diagnosis not present

## 2021-06-20 DIAGNOSIS — A419 Sepsis, unspecified organism: Principal | ICD-10-CM | POA: Diagnosis present

## 2021-06-20 DIAGNOSIS — C61 Malignant neoplasm of prostate: Secondary | ICD-10-CM | POA: Diagnosis present

## 2021-06-20 DIAGNOSIS — F419 Anxiety disorder, unspecified: Secondary | ICD-10-CM | POA: Diagnosis present

## 2021-06-20 DIAGNOSIS — F17210 Nicotine dependence, cigarettes, uncomplicated: Secondary | ICD-10-CM | POA: Diagnosis present

## 2021-06-20 DIAGNOSIS — Z20822 Contact with and (suspected) exposure to covid-19: Secondary | ICD-10-CM | POA: Diagnosis present

## 2021-06-20 DIAGNOSIS — G893 Neoplasm related pain (acute) (chronic): Secondary | ICD-10-CM | POA: Diagnosis present

## 2021-06-20 DIAGNOSIS — R110 Nausea: Secondary | ICD-10-CM | POA: Diagnosis present

## 2021-06-20 DIAGNOSIS — Z66 Do not resuscitate: Secondary | ICD-10-CM | POA: Diagnosis present

## 2021-06-20 DIAGNOSIS — Z7401 Bed confinement status: Secondary | ICD-10-CM | POA: Diagnosis not present

## 2021-06-20 DIAGNOSIS — R0603 Acute respiratory distress: Secondary | ICD-10-CM

## 2021-06-20 DIAGNOSIS — E872 Acidosis, unspecified: Secondary | ICD-10-CM | POA: Diagnosis not present

## 2021-06-20 DIAGNOSIS — G9520 Unspecified cord compression: Secondary | ICD-10-CM | POA: Diagnosis present

## 2021-06-20 DIAGNOSIS — G8220 Paraplegia, unspecified: Secondary | ICD-10-CM | POA: Diagnosis present

## 2021-06-20 DIAGNOSIS — E860 Dehydration: Secondary | ICD-10-CM | POA: Diagnosis present

## 2021-06-20 DIAGNOSIS — I10 Essential (primary) hypertension: Secondary | ICD-10-CM | POA: Diagnosis present

## 2021-06-20 DIAGNOSIS — R000 Tachycardia, unspecified: Secondary | ICD-10-CM | POA: Diagnosis not present

## 2021-06-20 DIAGNOSIS — R0781 Pleurodynia: Secondary | ICD-10-CM | POA: Diagnosis not present

## 2021-06-20 DIAGNOSIS — L89150 Pressure ulcer of sacral region, unstageable: Secondary | ICD-10-CM | POA: Diagnosis present

## 2021-06-20 DIAGNOSIS — N390 Urinary tract infection, site not specified: Secondary | ICD-10-CM | POA: Diagnosis not present

## 2021-06-20 DIAGNOSIS — R52 Pain, unspecified: Secondary | ICD-10-CM | POA: Diagnosis not present

## 2021-06-20 DIAGNOSIS — R319 Hematuria, unspecified: Secondary | ICD-10-CM | POA: Diagnosis not present

## 2021-06-20 DIAGNOSIS — E440 Moderate protein-calorie malnutrition: Secondary | ICD-10-CM | POA: Diagnosis present

## 2021-06-20 DIAGNOSIS — D630 Anemia in neoplastic disease: Secondary | ICD-10-CM | POA: Diagnosis present

## 2021-06-20 DIAGNOSIS — Z6832 Body mass index (BMI) 32.0-32.9, adult: Secondary | ICD-10-CM

## 2021-06-20 DIAGNOSIS — F22 Delusional disorders: Secondary | ICD-10-CM | POA: Diagnosis present

## 2021-06-20 DIAGNOSIS — R630 Anorexia: Secondary | ICD-10-CM | POA: Diagnosis present

## 2021-06-20 DIAGNOSIS — D649 Anemia, unspecified: Secondary | ICD-10-CM | POA: Diagnosis present

## 2021-06-20 DIAGNOSIS — R54 Age-related physical debility: Secondary | ICD-10-CM | POA: Diagnosis present

## 2021-06-20 DIAGNOSIS — C7951 Secondary malignant neoplasm of bone: Secondary | ICD-10-CM | POA: Diagnosis not present

## 2021-06-20 DIAGNOSIS — Z79899 Other long term (current) drug therapy: Secondary | ICD-10-CM | POA: Diagnosis not present

## 2021-06-20 LAB — COMPREHENSIVE METABOLIC PANEL
ALT: 17 U/L (ref 0–44)
AST: 18 U/L (ref 15–41)
Albumin: 2.2 g/dL — ABNORMAL LOW (ref 3.5–5.0)
Alkaline Phosphatase: 256 U/L — ABNORMAL HIGH (ref 38–126)
Anion gap: 12 (ref 5–15)
BUN: 36 mg/dL — ABNORMAL HIGH (ref 8–23)
CO2: 20 mmol/L — ABNORMAL LOW (ref 22–32)
Calcium: 7.7 mg/dL — ABNORMAL LOW (ref 8.9–10.3)
Chloride: 105 mmol/L (ref 98–111)
Creatinine, Ser: 1.06 mg/dL (ref 0.61–1.24)
GFR, Estimated: >60 mL/min (ref >60)
Glucose, Bld: 133 mg/dL — ABNORMAL HIGH (ref 70–99)
Potassium: 4.9 mmol/L (ref 3.5–5.1)
Sodium: 137 mmol/L (ref 135–145)
Total Bilirubin: 0.7 mg/dL (ref 0.3–1.2)
Total Protein: 6.4 g/dL — ABNORMAL LOW (ref 6.5–8.1)

## 2021-06-20 LAB — CBC WITH DIFFERENTIAL/PLATELET
Abs Immature Granulocytes: 1.3 10*3/uL — ABNORMAL HIGH (ref 0.00–0.07)
Basophils Absolute: 0 10*3/uL (ref 0.0–0.1)
Basophils Relative: 0 %
Eosinophils Absolute: 0 10*3/uL (ref 0.0–0.5)
Eosinophils Relative: 0 %
HCT: 26.1 % — ABNORMAL LOW (ref 39.0–52.0)
Hemoglobin: 8.1 g/dL — ABNORMAL LOW (ref 13.0–17.0)
Immature Granulocytes: 10 %
Lymphocytes Relative: 7 %
Lymphs Abs: 0.9 10*3/uL (ref 0.7–4.0)
MCH: 30.8 pg (ref 26.0–34.0)
MCHC: 31 g/dL (ref 30.0–36.0)
MCV: 99.2 fL (ref 80.0–100.0)
Monocytes Absolute: 1.1 10*3/uL — ABNORMAL HIGH (ref 0.1–1.0)
Monocytes Relative: 8 %
Neutro Abs: 9.9 10*3/uL — ABNORMAL HIGH (ref 1.7–7.7)
Neutrophils Relative %: 75 %
Platelets: 309 10*3/uL (ref 150–400)
RBC: 2.63 MIL/uL — ABNORMAL LOW (ref 4.22–5.81)
RDW: 21.9 % — ABNORMAL HIGH (ref 11.5–15.5)
WBC: 13.3 10*3/uL — ABNORMAL HIGH (ref 4.0–10.5)
nRBC: 4.7 % — ABNORMAL HIGH (ref 0.0–0.2)

## 2021-06-20 LAB — URINALYSIS, ROUTINE W REFLEX MICROSCOPIC
Bilirubin Urine: NEGATIVE
Glucose, UA: NEGATIVE mg/dL
Hgb urine dipstick: NEGATIVE
Ketones, ur: NEGATIVE mg/dL
Nitrite: NEGATIVE
Protein, ur: 30 mg/dL — AB
Specific Gravity, Urine: 1.016 (ref 1.005–1.030)
WBC, UA: >50 WBC/hpf — ABNORMAL HIGH (ref 0–5)
pH: 5 (ref 5.0–8.0)

## 2021-06-20 LAB — BRAIN NATRIURETIC PEPTIDE: B Natriuretic Peptide: 163.2 pg/mL — ABNORMAL HIGH (ref 0.0–100.0)

## 2021-06-20 LAB — LACTIC ACID, PLASMA: Lactic Acid, Venous: 1.9 mmol/L (ref 0.5–1.9)

## 2021-06-20 IMAGING — CT CT ABD-PELV W/ CM
3 of 7 series · 11 of 46 positions shown, 18 images · IV contrast (agent unspecified)
Comparison: [DATE]

CLINICAL DATA: Abdominal pain, acute, nonlocalized

EXAM:
CT ABDOMEN AND PELVIS WITH CONTRAST
TECHNIQUE: Multidetector CT imaging of the abdomen and pelvis was performed
using the standard protocol following bolus administration of
intravenous contrast.

[Series 2: axial st · axial · 0.83mm/px · z∈[-405,-75]mm · 7 of 90 slices shown, 12 images]
[im 12/90  soft-tissue]
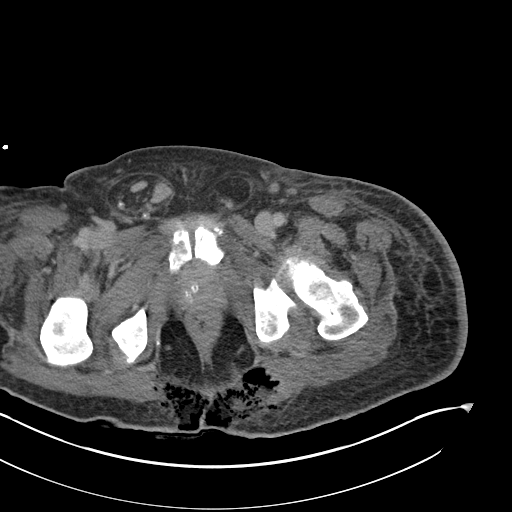
[im 12/90  bone]
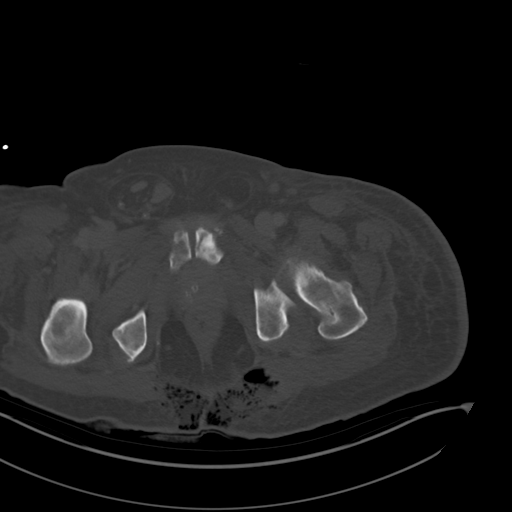
[im 23/90  soft-tissue]
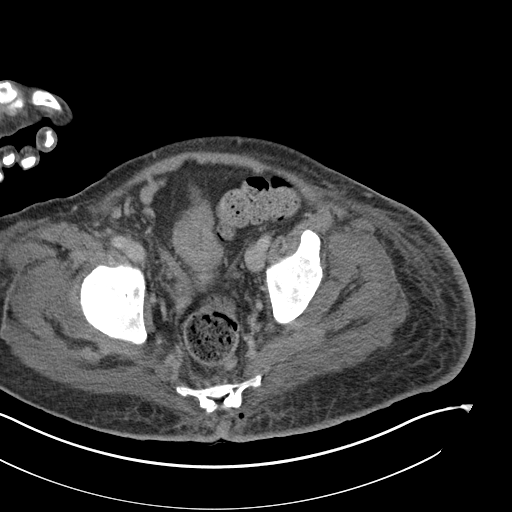
[im 34/90  soft-tissue]
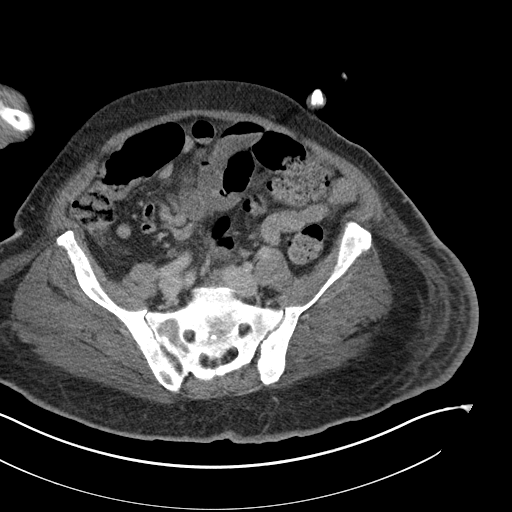
[im 45/90  soft-tissue]
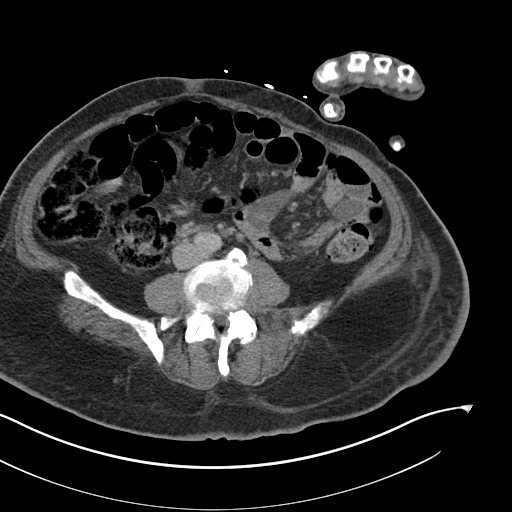
[im 45/90  lung]
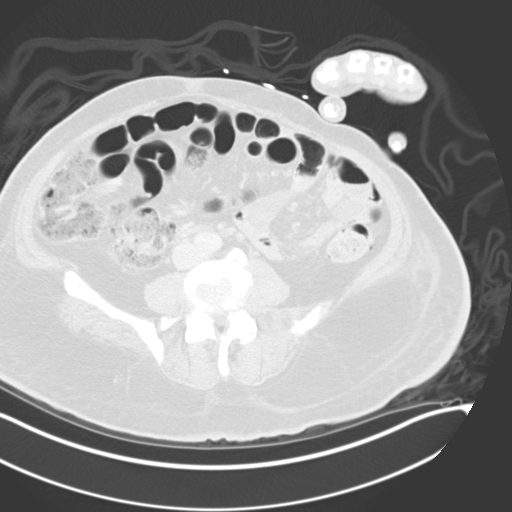
[im 56/90  soft-tissue]
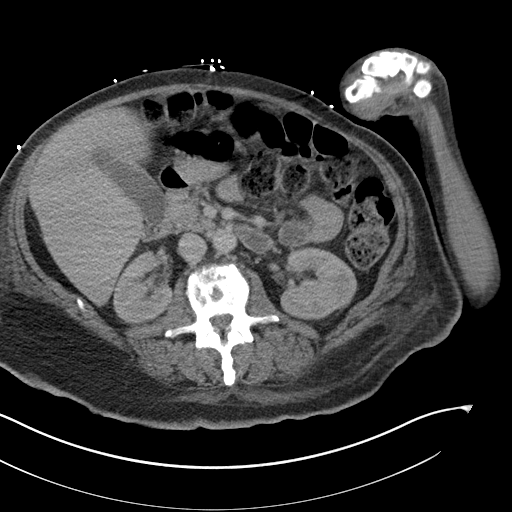
[im 56/90  lung]
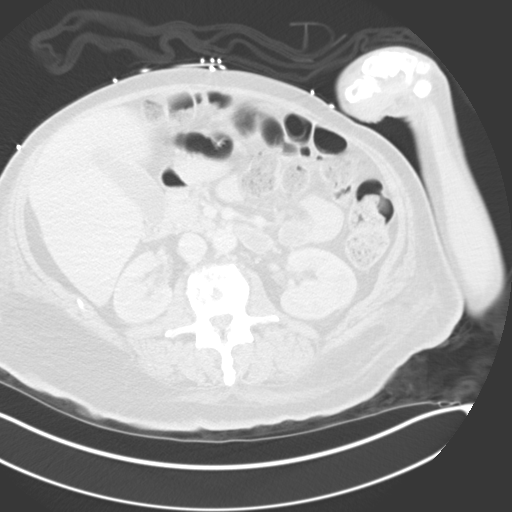
[im 67/90  soft-tissue]
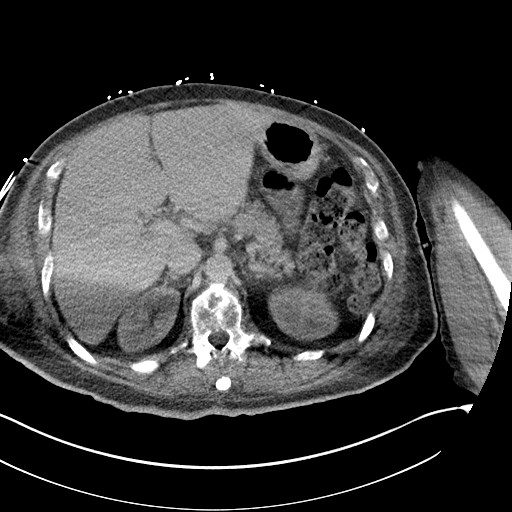
[im 67/90  lung]
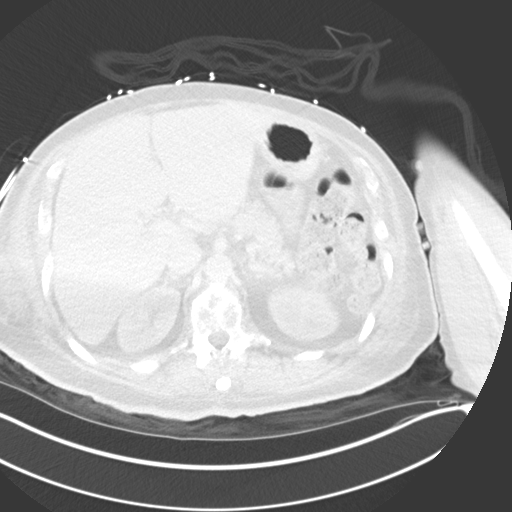
[im 78/90  soft-tissue]
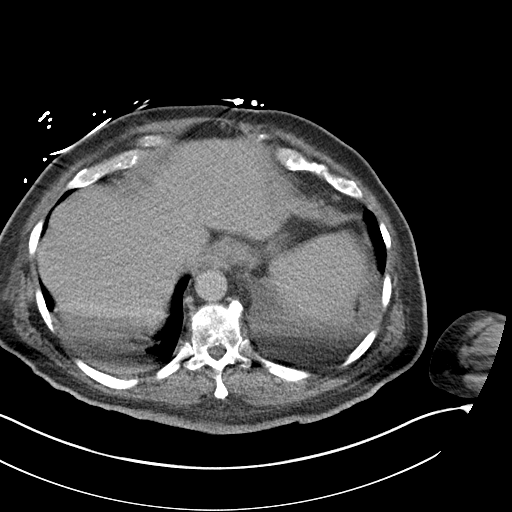
[im 78/90  lung]
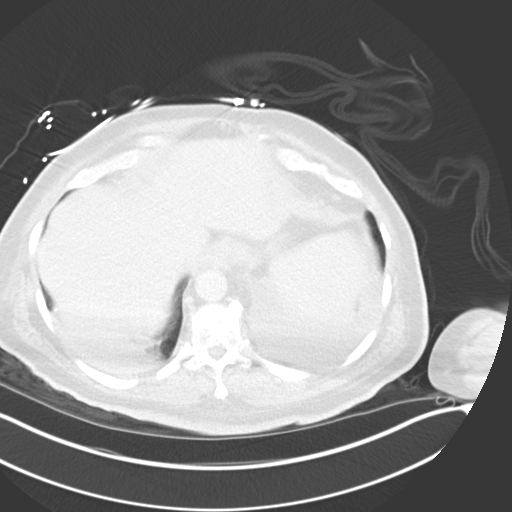

[Series 7: coronal st · coronal · 0.85mm/px · 3 of 155 slices shown, 4 images]
[im 52/155  soft-tissue]
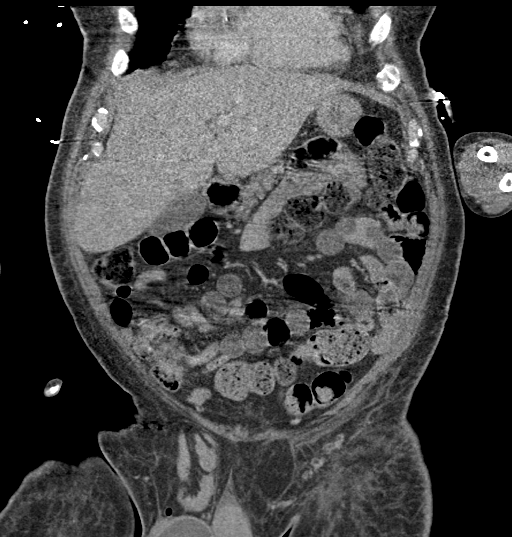
[im 69/155  soft-tissue]
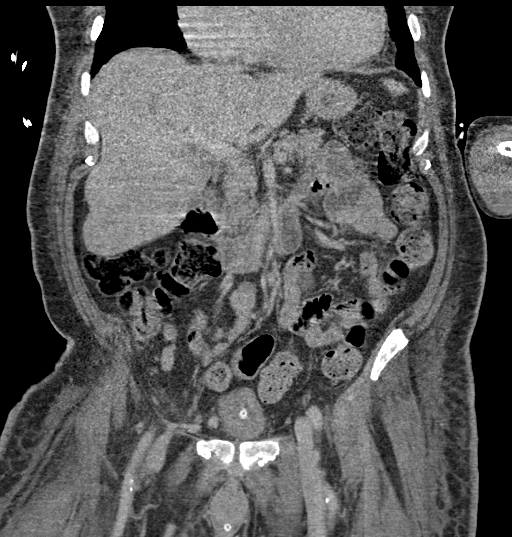
[im 69/155  bone]
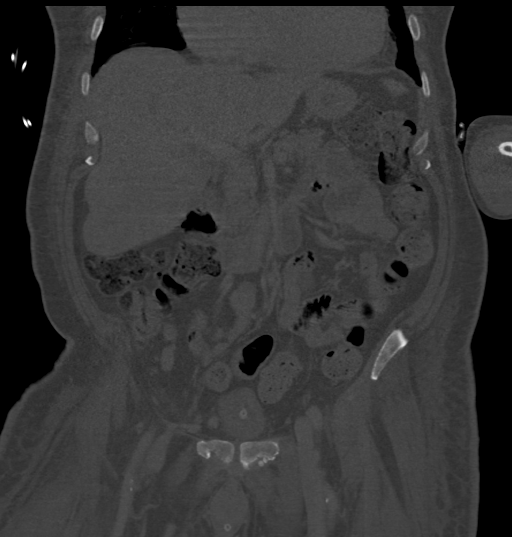
[im 86/155  soft-tissue]
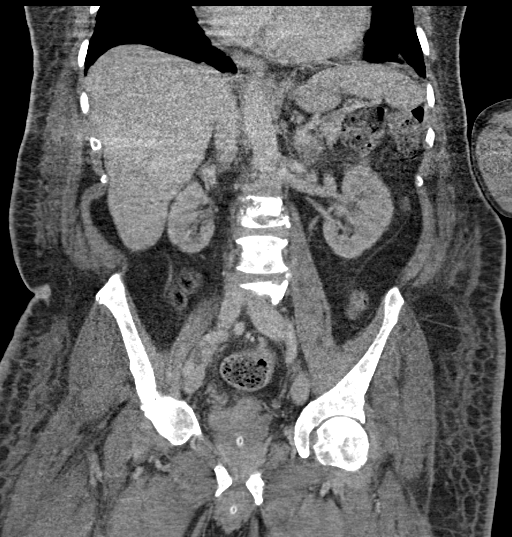

[Series 8: sagittal st · sagittal · 0.66mm/px · 1 of 218 slices shown, 2 images]
[im 73/218  soft-tissue]
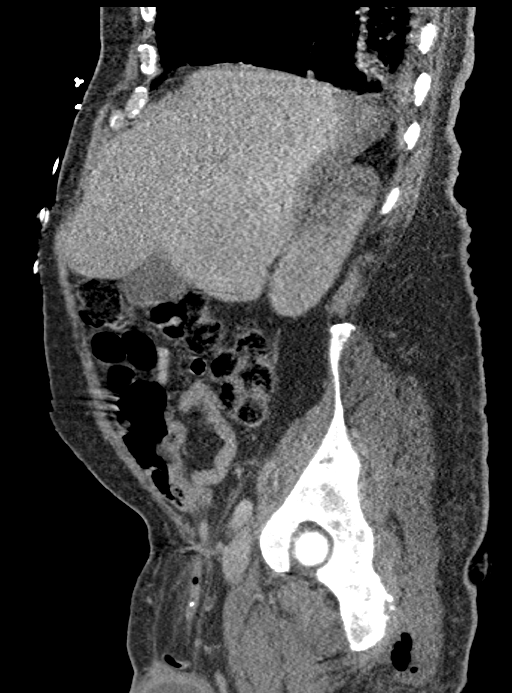
[im 73/218  bone]
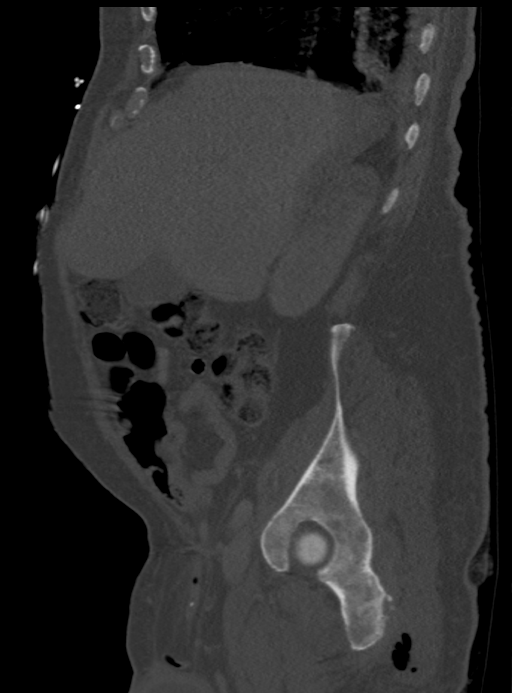

[11 of 46 positions shown; findings below may reference images not displayed]

RADIATION DOSE REDUCTION: This exam was performed according to the
departmental dose-optimization program which includes automated
exposure control, adjustment of the mA and/or kV according to
patient size and/or use of iterative reconstruction technique.

CONTRAST:  100mL OMNIPAQUE IOHEXOL 300 MG/ML  SOLN
FINDINGS: Lower chest: Small left and trace right pleural effusions. Bibasilar
atelectasis.

Hepatobiliary: No focal liver abnormality is seen. No gallstones,
gallbladder wall thickening, or biliary dilatation.

Pancreas: Unremarkable.

Spleen: Unremarkable.

Adrenals/Urinary Tract: Adrenals and kidneys are unremarkable.
Bladder is decompressed by Foley and is poorly evaluated.

Stomach/Bowel: Stomach is within normal limits. Bowel is normal in
caliber.

Vascular/Lymphatic: Mild atherosclerosis.  No new enlarged nodes.

Reproductive: Prostate appears smaller in size. Partially imaged
large hydrocele on the right as seen on prior studies.

Other: Body wall edema. Right inguinal hernia containing
nonobstructed loops of small bowel as well as a portion of the
appendix as before.

Musculoskeletal: New posterior subcutaneous soft tissue thickening
and gas at and below the level of the coccyx. This extends into the
gluteal musculature. No definite erosive changes to suggest
osteomyelitis. Diffuse osseous metastatic disease with progression
since the prior study.
IMPRESSION: Large decubitus ulcer at and inferior to the level of the coccyx. No
definite evidence of osteomyelitis.

Progression of osseous metastatic disease with increased loss of
vertebral body height.

Small left and trace right pleural effusions. Bibasilar atelectasis.

Similar right inguinal hernia containing nonobstructed small bowel
and a portion of the appendix.
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
## 2021-06-20 MED ORDER — ACETAMINOPHEN 650 MG RE SUPP: 650.0000 mg | Freq: Four times a day (QID) | RECTAL | Status: DC | PRN

## 2021-06-20 MED ORDER — HYDROMORPHONE HCL 1 MG/ML IJ SOLN
1.0000 mg | Freq: Once | INTRAMUSCULAR | Status: AC
  Administered 2021-06-20: 1 mg via INTRAVENOUS
  Filled 2021-06-20: qty 1

## 2021-06-20 MED ORDER — METRONIDAZOLE 500 MG/100ML IV SOLN
500.0000 mg | Freq: Two times a day (BID) | INTRAVENOUS | Status: DC
Start: 2021-06-20 — End: 2021-07-02
  Administered 2021-06-20 – 2021-07-01 (×21): 500 mg via INTRAVENOUS
  Filled 2021-06-20 (×21): qty 100

## 2021-06-20 MED ORDER — ONDANSETRON HCL 4 MG PO TABS: 4.0000 mg | ORAL_TABLET | Freq: Four times a day (QID) | ORAL | Status: DC | PRN

## 2021-06-20 MED ORDER — LIDOCAINE 5 % EX PTCH
1.0000 | MEDICATED_PATCH | CUTANEOUS | Status: DC
  Administered 2021-06-21 – 2021-07-04 (×11): 1 via TRANSDERMAL
  Filled 2021-06-20 (×15): qty 1

## 2021-06-20 MED ORDER — ONDANSETRON HCL 4 MG/2ML IJ SOLN
4.0000 mg | Freq: Four times a day (QID) | INTRAMUSCULAR | Status: DC | PRN
Start: 2021-06-20 — End: 2021-07-05

## 2021-06-20 MED ORDER — HYDROMORPHONE HCL 2 MG PO TABS
4.0000 mg | ORAL_TABLET | ORAL | Status: DC | PRN
  Administered 2021-06-20 – 2021-06-24 (×7): 4 mg via ORAL
  Filled 2021-06-20 (×8): qty 2

## 2021-06-20 MED ORDER — ENOXAPARIN SODIUM 40 MG/0.4ML IJ SOSY
40.0000 mg | PREFILLED_SYRINGE | INTRAMUSCULAR | Status: DC
  Administered 2021-06-20 – 2021-06-22 (×3): 40 mg via SUBCUTANEOUS
  Filled 2021-06-20 (×10): qty 0.4

## 2021-06-20 MED ORDER — METHADONE HCL 10 MG PO TABS
20.0000 mg | ORAL_TABLET | Freq: Three times a day (TID) | ORAL | Status: DC
  Administered 2021-06-20 – 2021-07-03 (×34): 20 mg via ORAL
  Filled 2021-06-20 (×36): qty 2

## 2021-06-20 MED ORDER — HYDRALAZINE HCL 50 MG PO TABS
50.0000 mg | ORAL_TABLET | Freq: Three times a day (TID) | ORAL | Status: DC
  Administered 2021-06-20 – 2021-07-02 (×31): 50 mg via ORAL
  Filled 2021-06-20 (×34): qty 1

## 2021-06-20 MED ORDER — SODIUM CHLORIDE 0.9 % IV SOLN: 2.0000 g | INTRAVENOUS | Status: DC

## 2021-06-20 MED ORDER — LIP MEDEX EX OINT
TOPICAL_OINTMENT | CUTANEOUS | Status: DC | PRN
  Administered 2021-06-23: 75 via TOPICAL
  Filled 2021-06-20: qty 7

## 2021-06-20 MED ORDER — LORAZEPAM 0.5 MG PO TABS
0.5000 mg | ORAL_TABLET | Freq: Two times a day (BID) | ORAL | Status: DC | PRN
  Administered 2021-06-21 – 2021-06-25 (×6): 0.5 mg via ORAL
  Filled 2021-06-20 (×6): qty 1

## 2021-06-20 MED ORDER — LACTATED RINGERS IV BOLUS (SEPSIS)
1000.0000 mL | Freq: Once | INTRAVENOUS | Status: AC
  Administered 2021-06-20: 1000 mL via INTRAVENOUS

## 2021-06-20 MED ORDER — VANCOMYCIN HCL 1500 MG/300ML IV SOLN
1500.0000 mg | INTRAVENOUS | Status: DC
  Administered 2021-06-21 – 2021-06-30 (×9): 1500 mg via INTRAVENOUS
  Filled 2021-06-20 (×12): qty 300

## 2021-06-20 MED ORDER — CHLORHEXIDINE GLUCONATE 0.12 % MT SOLN
15.0000 mL | Freq: Two times a day (BID) | OROMUCOSAL | Status: DC
  Administered 2021-06-20 – 2021-07-02 (×19): 15 mL via OROMUCOSAL
  Filled 2021-06-20 (×21): qty 15

## 2021-06-20 MED ORDER — SODIUM CHLORIDE 0.9 % IV SOLN
2.0000 g | INTRAVENOUS | Status: DC
Start: 2021-06-21 — End: 2021-07-02
  Administered 2021-06-21 – 2021-06-30 (×10): 2 g via INTRAVENOUS
  Filled 2021-06-20 (×11): qty 20

## 2021-06-20 MED ORDER — ACETAMINOPHEN 325 MG PO TABS
650.0000 mg | ORAL_TABLET | Freq: Once | ORAL | Status: AC
  Administered 2021-06-20: 650 mg via ORAL
  Filled 2021-06-20: qty 2

## 2021-06-20 MED ORDER — CELECOXIB 200 MG PO CAPS
200.0000 mg | ORAL_CAPSULE | Freq: Two times a day (BID) | ORAL | Status: DC
  Administered 2021-06-20 – 2021-06-25 (×10): 200 mg via ORAL
  Filled 2021-06-20 (×12): qty 1

## 2021-06-20 MED ORDER — BLISTEX MEDICATED EX OINT
TOPICAL_OINTMENT | CUTANEOUS | Status: DC | PRN
  Filled 2021-06-20: qty 6.3

## 2021-06-20 MED ORDER — DOCUSATE SODIUM 100 MG PO CAPS
100.0000 mg | ORAL_CAPSULE | Freq: Two times a day (BID) | ORAL | Status: DC
  Administered 2021-06-20 – 2021-07-02 (×16): 100 mg via ORAL
  Filled 2021-06-20 (×23): qty 1

## 2021-06-20 MED ORDER — PANTOPRAZOLE SODIUM 40 MG PO TBEC
40.0000 mg | DELAYED_RELEASE_TABLET | Freq: Two times a day (BID) | ORAL | Status: DC
  Administered 2021-06-20 – 2021-07-02 (×20): 40 mg via ORAL
  Filled 2021-06-20 (×25): qty 1

## 2021-06-20 MED ORDER — CHLORHEXIDINE GLUCONATE CLOTH 2 % EX PADS
6.0000 | MEDICATED_PAD | Freq: Every day | CUTANEOUS | Status: DC
  Administered 2021-06-20 – 2021-07-02 (×13): 6 via TOPICAL

## 2021-06-20 MED ORDER — GABAPENTIN 300 MG PO CAPS
300.0000 mg | ORAL_CAPSULE | Freq: Two times a day (BID) | ORAL | Status: DC
  Administered 2021-06-20 – 2021-07-02 (×21): 300 mg via ORAL
  Filled 2021-06-20 (×24): qty 1

## 2021-06-20 MED ORDER — IOHEXOL 300 MG/ML  SOLN
100.0000 mL | Freq: Once | INTRAMUSCULAR | Status: AC | PRN
  Administered 2021-06-20: 100 mL via INTRAVENOUS

## 2021-06-20 MED ORDER — SODIUM CHLORIDE (PF) 0.9 % IJ SOLN
INTRAMUSCULAR | Status: AC
  Filled 2021-06-20: qty 50

## 2021-06-20 MED ORDER — LACTATED RINGERS IV SOLN: INTRAVENOUS | Status: DC

## 2021-06-20 MED ORDER — METOPROLOL TARTRATE 25 MG PO TABS
25.0000 mg | ORAL_TABLET | Freq: Two times a day (BID) | ORAL | Status: DC
  Administered 2021-06-20 – 2021-07-02 (×22): 25 mg via ORAL
  Filled 2021-06-20 (×24): qty 1

## 2021-06-20 MED ORDER — ACETAMINOPHEN 325 MG PO TABS
650.0000 mg | ORAL_TABLET | Freq: Four times a day (QID) | ORAL | Status: DC | PRN
  Administered 2021-06-22: 650 mg via ORAL
  Filled 2021-06-20 (×4): qty 2

## 2021-06-20 MED ORDER — ASCORBIC ACID 500 MG PO TABS
500.0000 mg | ORAL_TABLET | Freq: Two times a day (BID) | ORAL | Status: DC
  Administered 2021-06-20 – 2021-07-02 (×19): 500 mg via ORAL
  Filled 2021-06-20 (×23): qty 1

## 2021-06-20 MED ORDER — IPRATROPIUM-ALBUTEROL 0.5-2.5 (3) MG/3ML IN SOLN
3.0000 mL | RESPIRATORY_TRACT | Status: DC | PRN
  Administered 2021-06-22 – 2021-06-25 (×2): 3 mL via RESPIRATORY_TRACT
  Filled 2021-06-20 (×2): qty 3

## 2021-06-20 MED ORDER — GUAIFENESIN ER 600 MG PO TB12
600.0000 mg | ORAL_TABLET | Freq: Two times a day (BID) | ORAL | Status: DC
  Administered 2021-06-20 – 2021-07-02 (×19): 600 mg via ORAL
  Filled 2021-06-20 (×23): qty 1

## 2021-06-20 MED ORDER — POLYETHYLENE GLYCOL 3350 17 G PO PACK
17.0000 g | PACK | Freq: Two times a day (BID) | ORAL | Status: DC
  Administered 2021-06-20 – 2021-06-29 (×12): 17 g via ORAL
  Filled 2021-06-20 (×19): qty 1

## 2021-06-20 MED ORDER — ORAL CARE MOUTH RINSE
15.0000 mL | Freq: Two times a day (BID) | OROMUCOSAL | Status: DC
  Administered 2021-06-21 – 2021-07-04 (×17): 15 mL via OROMUCOSAL

## 2021-06-20 MED ORDER — VANCOMYCIN HCL 2000 MG/400ML IV SOLN
2000.0000 mg | Freq: Once | INTRAVENOUS | Status: AC
  Administered 2021-06-20: 2000 mg via INTRAVENOUS
  Filled 2021-06-20 (×2): qty 400

## 2021-06-20 MED ORDER — SODIUM CHLORIDE 0.9 % IV SOLN
2.0000 g | INTRAVENOUS | Status: DC
  Administered 2021-06-20: 2 g via INTRAVENOUS
  Filled 2021-06-20: qty 20

## 2021-06-20 NOTE — Progress Notes (Signed)
Pharmacy Antibiotic Note ? ?Randall Larsen. is a 66 y.o. male admitted on 06/20/2021 with cellulitis.  Pharmacy has been consulted for vancomycin dosing. ?06/01/2021 wt 103.1 kg  ? large decubitus ulcer appreciated and concern for deeper osteomyelitis and abdominal pelvic CT did not show any evidence of osteo ?Plan: ?Vancomycin 2000 mg IV x 1 dose followed by vancomycin 1500 mg IV q24 for est AUC 460.8 using SCr 1.06 and VD 0.5, wt 103.1 kg ?Ceftriaxone 2 gm IV q24 per MD ?Flagyl 500 mg IV q12 per MD  ? ?Temp (24hrs), Avg:102.3 ?F (39.1 ?C), Min:102.3 ?F (39.1 ?C), Max:102.3 ?F (39.1 ?C) ? ?Recent Labs  ?Lab 06/14/21 ?1119 06/20/21 ?1010  ?WBC 5.4 13.3*  ?CREATININE 1.05 1.06  ?LATICACIDVEN  --  1.9  ?  ?CrCl cannot be calculated (Unknown ideal weight.).   ? ?No Known Allergies ? ?Antimicrobials this admission: ?3/14 ceftriaxone> ?3/14 vancomycin>> ?3/14 flagyl>> ?Dose adjustments this admission: ? ?Microbiology results: ?3/14 BCx2 ? ?Thank you for allowing pharmacy to be a part of this patient?s care. ? ?Eudelia Bunch, Pharm.D ?06/20/2021 2:25 PM ? ?

## 2021-06-20 NOTE — Procedures (Signed)
Excisional debridement: ? ?Tool used for debridement (curette, scapel, etc.)  #10 scalpel  ? ?Frequency of surgical debridement.   Initial debridement  ? ?Measurement of total devitalized tissue (wound surface) before and after surgical debridement.   Pre debridement wound measured 12 cm x 13 cm  ?Post debridement wound measured 12 cm x 13 cm x 6 cm ? ?Area and depth of devitalized tissue removed from wound.  12 cm x 13 cm x 6 cm ? ?Blood loss and description of tissue removed.  Minimal blood loss, necrotic eschar and fatty tissue removed from wound  ? ?Evidence of the progress of the wound's response to treatment. ? A.  Current wound volume (current dimensions and depth).  12 cm x 13 cm x 6 cm ? B.  Presence (and extent of) of infection.  Absent, necrotic tissue and resultant drainage/odor present  ? C.  Presence (and extent of) of non viable tissue.  Extensive non-viable tissue present  ? D.  Other material in the wound that is expected to inhibit healing.  Close in proximity to rectum, contamination with stool material may inhibit wound healing  ? ?Was there any viable tissue removed (measurements): None  ? ?Norm Parcel, PA-C ?[facility] ?06/20/2021, 4:17 PM ?Please see Amion for pager number during day hours 7:00am-4:30pm ? ?  ?

## 2021-06-20 NOTE — ED Triage Notes (Signed)
[facility] brings pt in from [facility] for bed sores. Family unhappy with care he is receiving from facility. ?

## 2021-06-20 NOTE — H&P (Addendum)
?History and Physical  ? ? ?Patient: Randall Larsen. OHY:[id number] DOB: 1955/11/03 ?DOA: 06/20/2021 ?DOS: the patient was seen and examined on 06/20/2021 ?PCP: Hal Morales, DO  ?Patient coming from: SNF. ? ?Chief Complaint:  ?Chief Complaint  ?Patient presents with  ? Wound Check  ? ?HPI: Randall Larsen is a 66 y.o. 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  He is oriented to name, knows in the hospital, but is otherwise disoriented and unable to provide further history.  He stated that he was having 10 out of 10 pain and requested something to treat it.  He is able to answer simple questions and denied headache, chest or abdominal pain.  ? ?ED course: Initial vital signs were temperature 102.3 ?F, pulse 105, respirations 20, BP 133/66 mmHg O2 sat 97% on nasal cannula oxygen.  The patient received ceftriaxone 2 g IVPB and 1000 mL of LR bolus.  I added hydromorphone 1 mg IVP, vancomycin and metronidazole. ? ?Lab work: Urinalysis cloudy with large leukocyte esterase, 21-50 RBC, more than 50 WBC and many bacteria.  CBC is her white count 13.3, hemoglobin 8.1 g/dL platelets 309.  BNP 162.2 pg/mL.  CMP showed a CO2 of 20 mmol/L, the rest of the electrolytes were normal once calcium is corrected to albumin.  Glucose 133, BUN 36 and creatinine 1.06 mg/dL.  Alkaline phosphatase 256 units/L.  Total protein 6.4 and albumin 2.2 g/dL.  Transaminases and total bilirubin are normal. ? ?Imaging: CT abdomen/pelvis with contrast showed large decubitus ulcer at and inferior to the level of the coccyx.  No definite evidence of osteomyelitis.  There is progression of osseous metastatic disease with increased loss of vertebral body height.  Bibasilar atelectasis with small left and trace right pleural effusions.  Similar  right inguinal hernia containing nonobstructed small bowel and a portion of the appendix. ? ?Review of Systems: As mentioned in the history of present illness. All other systems reviewed and are negative. ?Past Medical History:  ?Diagnosis Date  ? Hypertension   ? Prostate cancer ([place])   ? ?No past surgical history on file. ?Social History:  reports that he has been smoking cigarettes. He has a 25.00 pack-year smoking history. He has never used smokeless tobacco. He reports that he does not currently use alcohol. 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 ? ?No Known Allergies ? ?No family history on file. ? ?Prior to Admission medications   ?Medication Sig Start Date End Date Taking? Authorizing Provider  ?acetaminophen (TYLENOL) 500 MG tablet Take 2 tablets (1,000 mg total) by mouth 3 (three) times daily. 05/30/21  Yes Shawna Clamp, MD  ?celecoxib (CELEBREX) 200 MG capsule Take 1 capsule (200 mg total) by mouth 2 (two) times daily. 05/30/21  Yes Shawna Clamp, MD  ?docusate sodium (COLACE) 100 MG capsule Take 1 capsule (100 mg total) by mouth every 12 (twelve) hours. ?Patient taking differently: Take 100 mg by mouth 2 (two) times daily. 04/02/21  Yes Drenda Freeze, MD  ?furosemide (LASIX) 40 MG tablet Take 1 tablet (40 mg total) by mouth daily. 05/30/21 05/30/22 Yes Shawna Clamp, MD  ?gabapentin (NEURONTIN) 300 MG capsule Take 1 capsule (300 mg total) by mouth 2 (two) times daily. 05/30/21  Yes Shawna Clamp, MD  ?guaiFENesin ([place]) 600 MG  12 hr tablet Take 600 mg by mouth every 12 (twelve) hours. For 7 days 06/18/21 06/24/21 Yes [provider]  ?hydrALAZINE (APRESOLINE) 50 MG tablet Take 1 tablet (50 mg total) by mouth 3 (three) times daily. 02/08/21  Yes Carlisle Cater, PA-C  ?HYDROmorphone (DILAUDID) 4 MG tablet Take 1-1.5 tablets (4-6 mg total) by mouth every 3 (three) hours as needed for severe pain or moderate pain (for breakthrough  pain). ?Patient taking differently: Take 4-6 mg by mouth See admin instructions. '4mg'$  every 3 hours as needed for pain level 1-5 on pain scale, or '6mg'$  every 3 hours as needed for pain level 6-10 on pain scale. 05/30/21  Yes Pickenpack-Cousar, Carlena Sax, NP  ?ipratropium-albuterol (DUONEB) 0.5-2.5 (3) MG/3ML SOLN Take 3 mLs by nebulization every 4 (four) hours as needed (shortness of breath).   Yes [provider]  ?lidocaine (LIDODERM) 5 % Place 1 patch onto the skin daily. Remove & Discard patch within 12 hours or as directed by MD 04/26/21  Yes Pickenpack-Cousar, Carlena Sax, NP  ?LORazepam (ATIVAN) 0.5 MG tablet Take 1 tablet (0.5 mg total) by mouth 2 (two) times daily as needed for anxiety. 05/30/21  Yes Shawna Clamp, MD  ?methadone (DOLOPHINE) 10 MG tablet Take 2 tablets (20 mg total) by mouth every 8 (eight) hours. 05/30/21  Yes Pickenpack-Cousar, Carlena Sax, NP  ?methocarbamol (ROBAXIN) 500 MG tablet Take 1 tablet (500 mg total) by mouth 3 (three) times daily as needed for muscle spasms. 05/30/21  Yes Shawna Clamp, MD  ?metoprolol tartrate (LOPRESSOR) 25 MG tablet Take 1 tablet (25 mg total) by mouth 2 (two) times daily. 05/30/21  Yes Shawna Clamp, MD  ?Multiple Vitamin (MULTIVITAMIN WITH MINERALS) TABS tablet Take 1 tablet by mouth daily.   Yes [provider]  ?ondansetron (ZOFRAN) 4 MG tablet Take 1 tablet (4 mg total) by mouth every 8 (eight) hours as needed for nausea or vomiting. 04/26/21  Yes Pickenpack-Cousar, Carlena Sax, NP  ?OVER THE COUNTER MEDICATION Take 120 mLs by mouth in the morning, at noon, and at bedtime. House supplement/health shake   Yes [provider]  ?pantoprazole (PROTONIX) 40 MG tablet Take 1 tablet (40 mg total) by mouth 2 (two) times daily. 05/30/21  Yes Shawna Clamp, MD  ?polyethylene glycol (MIRALAX / GLYCOLAX) 17 g packet Take 17 g by mouth 2 (two) times daily. 04/02/21  Yes Drenda Freeze, MD  ?vitamin C (ASCORBIC ACID) 500 MG tablet Take 500 mg by mouth  2 (two) times daily.   Yes [provider]  ?abiraterone acetate (ZYTIGA) 250 MG tablet Take 4 tablets (1,000 mg total) by mouth daily. Take on an empty stomach 1 hour before or 2 hours after a meal ?Patient not taking: Reported on 06/20/2021 06/15/21   Orson Slick, MD  ?predniSONE (DELTASONE) 5 MG tablet Take 1 tablet (5 mg total) by mouth daily with breakfast. ?Patient not taking: Reported on 06/20/2021 06/15/21   Orson Slick, MD  ? ? ?Physical Exam: ?Vitals:  ? 06/20/21 0949 06/20/21 1130 06/20/21 1200 06/20/21 1230  ?BP: 139/68 135/68 (!) 142/76 133/61  ?Pulse: (!) 105 98 (!) 102 (!) 102  ?Resp: 20 (!) '21 18 20  '$ ?Temp: (!) 102.3 ?F (39.1 ?C)     ?TempSrc: Rectal     ?SpO2: 95% 94% 94% 93%  ? ?Physical Exam ?Vitals and nursing note reviewed.  ?Constitutional:   ?   Appearance: He is obese.  ?HENT:  ?   Head: Normocephalic.  ?  Mouth/Throat:  ?   Mouth: Mucous membranes are dry.  ?   Comments: Lips are very dry and cracked. ?Eyes:  ?   Pupils: Pupils are equal, round, and reactive to light.  ?Neck:  ?   Vascular: No JVD.  ?Cardiovascular:  ?   Rate and Rhythm: Regular rhythm. Tachycardia present.  ?   Heart sounds: S1 normal and S2 normal.  ?Pulmonary:  ?   Breath sounds: Normal breath sounds.  ?Abdominal:  ?   General: There is no distension.  ?   Palpations: Abdomen is soft.  ?   Tenderness: There is no abdominal tenderness.  ?Musculoskeletal:  ?   Cervical back: Neck supple.  ?   Right lower leg: 3+ Pitting Edema present.  ?   Left lower leg: 3+ Pitting Edema present.  ?Skin: ?   General: Skin is warm and dry.  ?   Coloration: Skin is not jaundiced.  ?   Findings: Wound present.  ?   Comments: Malodorous, large unstageable sacral decubitus ulcer.  Left upper pretibial area open blister.  Please see pictures below.  ?Neurological:  ?   Mental Status: He is alert. He is disoriented.  ?Psychiatric:     ?   Mood and Affect: Mood is anxious.     ?   Behavior: Behavior is cooperative.   ? ? ? ? ? ? ? ?Data Reviewed: ? ?There are no new results to review at this time. ? ?Assessment and Plan: ?Principal Problem: ?  Sepsis due to undetermined organism [facility]) ?Due to: ?  Unstageable pressure ulcer of sacral reg
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
## 2021-06-20 NOTE — ED Provider Notes (Signed)
?[facility] DEPT ?Provider Note ? ? ?CSN: [id number] ?Arrival date & time: 06/20/21  2197 ? ?  ? ?History ? ?Chief Complaint  ?Patient presents with  ?? Wound Check  ? ? ?Randall Larsen. is a 66 y.o. male. ? ?[age]-year-old male with history of prostate cancer presents for evaluation of a sacral wound.  Patient was scheduled to be seen at the cancer center today.  Notes that he is chronically short of breath and is on oxygen.  Does have a history of paraplegia and is bedbound.  Notes slight increased cough without fever or chills.  EMS called and patient brought here for further management ? ? ?  ? ?Home Medications ?Prior to Admission medications   ?Medication Sig Start Date End Date Taking? Authorizing Provider  ?abiraterone acetate (ZYTIGA) 250 MG tablet Take 4 tablets (1,000 mg total) by mouth daily. Take on an empty stomach 1 hour before or 2 hours after a meal 06/15/21   Orson Slick, MD  ?acetaminophen (TYLENOL) 500 MG tablet Take 2 tablets (1,000 mg total) by mouth 3 (three) times daily. 05/30/21   Shawna Clamp, MD  ?celecoxib (CELEBREX) 200 MG capsule Take 1 capsule (200 mg total) by mouth 2 (two) times daily. 05/30/21   Shawna Clamp, MD  ?docusate sodium (COLACE) 100 MG capsule Take 1 capsule (100 mg total) by mouth every 12 (twelve) hours. 04/02/21   Drenda Freeze, MD  ?furosemide (LASIX) 40 MG tablet Take 1 tablet (40 mg total) by mouth daily. 05/30/21 05/30/22  Shawna Clamp, MD  ?gabapentin (NEURONTIN) 300 MG capsule Take 1 capsule (300 mg total) by mouth 2 (two) times daily. 05/30/21   Shawna Clamp, MD  ?hydrALAZINE (APRESOLINE) 50 MG tablet Take 1 tablet (50 mg total) by mouth 3 (three) times daily. 02/08/21   Carlisle Cater, PA-C  ?HYDROmorphone (DILAUDID) 4 MG tablet Take 1-1.5 tablets (4-6 mg total) by mouth every 3 (three) hours as needed for severe pain or moderate pain (for breakthrough pain). ?Patient taking differently: Take 4-6 mg by mouth every 3  (three) hours as needed for severe pain or moderate pain (for breakthrough pain). For pain scale of 6-10  1.5 tablet PO every 3 hrs 05/30/21   Pickenpack-Cousar, Carlena Sax, NP  ?lidocaine (LIDODERM) 5 % Place 1 patch onto the skin daily. Remove & Discard patch within 12 hours or as directed by MD 04/26/21   Pickenpack-Cousar, Carlena Sax, NP  ?LORazepam (ATIVAN) 0.5 MG tablet Take 1 tablet (0.5 mg total) by mouth 2 (two) times daily as needed for anxiety. 05/30/21   Shawna Clamp, MD  ?methadone (DOLOPHINE) 10 MG tablet Take 2 tablets (20 mg total) by mouth every 8 (eight) hours. 05/30/21   Pickenpack-Cousar, Carlena Sax, NP  ?methocarbamol (ROBAXIN) 500 MG tablet Take 1 tablet (500 mg total) by mouth 3 (three) times daily as needed for muscle spasms. 05/30/21   Shawna Clamp, MD  ?metoprolol tartrate (LOPRESSOR) 25 MG tablet Take 1 tablet (25 mg total) by mouth 2 (two) times daily. 05/30/21   Shawna Clamp, MD  ?Multiple Vitamin (MULTIVITAMIN WITH MINERALS) TABS tablet Take 1 tablet by mouth daily.    [provider]  ?ondansetron (ZOFRAN) 4 MG tablet Take 1 tablet (4 mg total) by mouth every 8 (eight) hours as needed for nausea or vomiting. 04/26/21   Pickenpack-Cousar, Carlena Sax, NP  ?pantoprazole (PROTONIX) 40 MG tablet Take 1 tablet (40 mg total) by mouth 2 (two) times daily. 05/30/21  Shawna Clamp, MD  ?polyethylene glycol (MIRALAX / GLYCOLAX) 17 g packet Take 17 g by mouth 2 (two) times daily. 04/02/21   Drenda Freeze, MD  ?predniSONE (DELTASONE) 5 MG tablet Take 1 tablet (5 mg total) by mouth daily with breakfast. 06/15/21   Orson Slick, MD  ?vitamin C (ASCORBIC ACID) 500 MG tablet Take 500 mg by mouth daily.    [provider]  ?   ? ?Allergies    ?Patient has no known allergies.   ? ?Review of Systems   ?Review of Systems  ?All other systems reviewed and are negative. ? ?Physical Exam ?Updated Vital Signs ?BP 133/66   SpO2 97%  ?Physical Exam ?Vitals and nursing note reviewed.   ?Constitutional:   ?   General: He is not in acute distress. ?   Appearance: Normal appearance. He is well-developed. He is not toxic-appearing.  ?HENT:  ?   Head: Normocephalic and atraumatic.  ?Eyes:  ?   General: Lids are normal.  ?   Conjunctiva/sclera: Conjunctivae normal.  ?   Pupils: Pupils are equal, round, and reactive to light.  ?Neck:  ?   Thyroid: No thyroid mass.  ?   Trachea: No tracheal deviation.  ?Cardiovascular:  ?   Rate and Rhythm: Normal rate and regular rhythm.  ?   Heart sounds: Normal heart sounds. No murmur heard. ?  No gallop.  ?Pulmonary:  ?   Effort: Pulmonary effort is normal. No respiratory distress.  ?   Breath sounds: No stridor. Examination of the right-upper field reveals decreased breath sounds and rhonchi. Examination of the left-upper field reveals decreased breath sounds and rhonchi. Decreased breath sounds and rhonchi present. No wheezing or rales.  ?Abdominal:  ?   General: There is no distension.  ?   Palpations: Abdomen is soft.  ?   Tenderness: There is no abdominal tenderness. There is no rebound.  ?Genitourinary: ? ? ?Musculoskeletal:     ?   General: No tenderness. Normal range of motion.  ?   Cervical back: Normal range of motion and neck supple.  ?Skin: ?   General: Skin is warm and dry.  ?   Findings: No abrasion or rash.  ?Neurological:  ?   Mental Status: He is alert and oriented to person, place, and time. Mental status is at baseline.  ?   GCS: GCS eye subscore is 4. GCS verbal subscore is 5. GCS motor subscore is 6.  ?   Cranial Nerves: No cranial nerve deficit.  ?   Comments: No new changes  ?Psychiatric:     ?   Attention and Perception: Attention normal.     ?   Speech: Speech normal.     ?   Behavior: Behavior normal.  ? ? ?ED Results / Procedures / Treatments   ?Labs ?(all labs ordered are listed, but only abnormal results are displayed) ?Labs Reviewed  ?CULTURE, BLOOD (ROUTINE X 2)  ?CULTURE, BLOOD (ROUTINE X 2)  ?CBC WITH DIFFERENTIAL/PLATELET   ?COMPREHENSIVE METABOLIC PANEL  ?URINALYSIS, ROUTINE W REFLEX MICROSCOPIC  ?BRAIN NATRIURETIC PEPTIDE  ?LACTIC ACID, PLASMA  ? ? ?EKG ?EKG Interpretation ? ?Date/Time:  Tuesday June 20 2021 10:59:19 EDT ?Ventricular Rate:  96 ?PR Interval:  181 ?QRS Duration: 101 ?QT Interval:  357 ?QTC Calculation: 452 ?R Axis:   -5 ?Text Interpretation: Sinus rhythm Confirmed by Lacretia Leigh ([id number]) on 06/20/2021 11:52:15 AM ? ?Radiology ?No results found. ? ?Procedures ?Procedures  ? ? ?Medications Ordered  in ED ?Medications  ?lactated ringers infusion (has no administration in time range)  ? ? ?ED Course/ Medical Decision Making/ A&P ?  ?                        ?Medical Decision Making ?Amount and/or Complexity of Data Reviewed ?Labs: ordered. ?Radiology: ordered. ?ECG/medicine tests: ordered. ? ?Risk ?OTC drugs. ?Prescription drug management. ? ?Patient's EKG per my interpretation shows sinus tachycardia.  Chest x-ray does show pulmonary vascular congestion but BNP is normal.  Still patient could have some element of CHF. ?Patient febrile here to 102.3.  Given oral Tylenol. ?Patient has large decubitus ulcer appreciated and concern for deeper osteomyelitis and abdominal pelvic CT did not show any evidence of osteo-.  Patient's urinalysis does show infection and was treated with 2 g Rocephin.  Lactate within normal limits.  Mild leukocytosis noted on CBC.  Blood cultures obtained.  Plan will be for inpatient admission.  Discussed with hospitalist ? ? ?  ? ? ? ? ?Final Clinical Impression(s) / ED Diagnoses ?Final diagnoses:  ?None  ? ? ?Rx / DC Orders ?ED Discharge Orders   ? ? None  ? ?  ? ? ?  ?Lacretia Leigh, MD ?06/20/21 1315 ? ?

## 2021-06-21 ENCOUNTER — Telehealth: Payer: Self-pay

## 2021-06-21 ENCOUNTER — Encounter (HOSPITAL_COMMUNITY): Payer: Self-pay | Admitting: Anesthesiology

## 2021-06-21 ENCOUNTER — Other Ambulatory Visit: Payer: Self-pay

## 2021-06-21 ENCOUNTER — Encounter: Payer: Self-pay | Admitting: Physician Assistant

## 2021-06-21 ENCOUNTER — Telehealth: Payer: Self-pay | Admitting: *Deleted

## 2021-06-21 DIAGNOSIS — A419 Sepsis, unspecified organism: Secondary | ICD-10-CM | POA: Diagnosis not present

## 2021-06-21 LAB — CBC WITH DIFFERENTIAL/PLATELET
Abs Immature Granulocytes: 1.79 10*3/uL — ABNORMAL HIGH (ref 0.00–0.07)
Basophils Absolute: 0.1 10*3/uL (ref 0.0–0.1)
Basophils Relative: 1 %
Eosinophils Absolute: 0 10*3/uL (ref 0.0–0.5)
Eosinophils Relative: 0 %
HCT: 21.9 % — ABNORMAL LOW (ref 39.0–52.0)
Hemoglobin: 6.7 g/dL — CL (ref 13.0–17.0)
Immature Granulocytes: 15 %
Lymphocytes Relative: 8 %
Lymphs Abs: 0.9 10*3/uL (ref 0.7–4.0)
MCH: 30.5 pg (ref 26.0–34.0)
MCHC: 30.6 g/dL (ref 30.0–36.0)
MCV: 99.5 fL (ref 80.0–100.0)
Monocytes Absolute: 1.1 10*3/uL — ABNORMAL HIGH (ref 0.1–1.0)
Monocytes Relative: 9 %
Neutro Abs: 8.1 10*3/uL — ABNORMAL HIGH (ref 1.7–7.7)
Neutrophils Relative %: 67 %
Platelets: 259 10*3/uL (ref 150–400)
RBC: 2.2 MIL/uL — ABNORMAL LOW (ref 4.22–5.81)
RDW: 22 % — ABNORMAL HIGH (ref 11.5–15.5)
WBC: 12.1 10*3/uL — ABNORMAL HIGH (ref 4.0–10.5)
nRBC: 4.1 % — ABNORMAL HIGH (ref 0.0–0.2)

## 2021-06-21 LAB — CBC
HCT: 20.2 % — ABNORMAL LOW (ref 39.0–52.0)
Hemoglobin: 6.4 g/dL — CL (ref 13.0–17.0)
MCH: 31.2 pg (ref 26.0–34.0)
MCHC: 31.7 g/dL (ref 30.0–36.0)
MCV: 98.5 fL (ref 80.0–100.0)
Platelets: 261 10*3/uL (ref 150–400)
RBC: 2.05 MIL/uL — ABNORMAL LOW (ref 4.22–5.81)
RDW: 21.8 % — ABNORMAL HIGH (ref 11.5–15.5)
WBC: 12.2 10*3/uL — ABNORMAL HIGH (ref 4.0–10.5)
nRBC: 2.7 % — ABNORMAL HIGH (ref 0.0–0.2)

## 2021-06-21 LAB — COMPREHENSIVE METABOLIC PANEL
ALT: 16 U/L (ref 0–44)
AST: 18 U/L (ref 15–41)
Albumin: 1.9 g/dL — ABNORMAL LOW (ref 3.5–5.0)
Alkaline Phosphatase: 211 U/L — ABNORMAL HIGH (ref 38–126)
Anion gap: 8 (ref 5–15)
BUN: 39 mg/dL — ABNORMAL HIGH (ref 8–23)
CO2: 21 mmol/L — ABNORMAL LOW (ref 22–32)
Calcium: 7.5 mg/dL — ABNORMAL LOW (ref 8.9–10.3)
Chloride: 105 mmol/L (ref 98–111)
Creatinine, Ser: 1.27 mg/dL — ABNORMAL HIGH (ref 0.61–1.24)
GFR, Estimated: >60 mL/min (ref >60)
Glucose, Bld: 96 mg/dL (ref 70–99)
Potassium: 4.5 mmol/L (ref 3.5–5.1)
Sodium: 134 mmol/L — ABNORMAL LOW (ref 135–145)
Total Bilirubin: 0.7 mg/dL (ref 0.3–1.2)
Total Protein: 5.7 g/dL — ABNORMAL LOW (ref 6.5–8.1)

## 2021-06-21 MED ORDER — MORPHINE SULFATE (PF) 2 MG/ML IV SOLN
1.0000 mg | Freq: Once | INTRAVENOUS | Status: AC | PRN
  Administered 2021-06-22: 1 mg via INTRAVENOUS
  Filled 2021-06-21: qty 1

## 2021-06-21 MED ORDER — DAKINS (1/4 STRENGTH) 0.125 % EX SOLN
Freq: Two times a day (BID) | CUTANEOUS | Status: AC
  Filled 2021-06-21: qty 473

## 2021-06-21 NOTE — Progress Notes (Incomplete Revision)
?[facility] Progress Note ? ?Patient: Randall Larsen.     ?OQH:[id number]  ?DOA: 06/20/2021   ?  ?  ?Brief hospital course: ? 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 ?He was admitted from 1/23 through 2/21 and treated for cancer associated pain, received radiation as palliation for his pain, was started on methadone and oral Dilaudid and then discharged to a skilled nursing facility.  He also had a Foley placed was meant to be permanent. ?He was supposed to start Zytiga and prednisone today but ended up back in the hospital on 3/14. ?The patient is a poor historian and unable to give me history.  His ex-wife and daughter live in [place] and states that he they came to visit him last week and noticed an extreme foul smell coming from him.  They visited him for 4 days and were told repeatedly that "he is getting better" but continued to notice the foul smell and therefore request that he be sent to the hospital. ?In the ED he was noted to have a temperature of 102.3, heart rate in the 100s, and extensive necrotic area on both of his buttocks, WBC count of 13.3 and was started on IV antibiotics.  General surgery was consulted and and debrided the area.  Wound postdebridement measured 12 x 13 x 6 cm. ? ?The patient has been confused and paranoid in the hospital. ? ?Subjective:  ?The patient appears confused and is unable to answer my questions.    ?Assessment and Plan: ?Assessment and Plan: ?* Sepsis due to undetermined organism [facility]) ?- Sepsis likely secondary to large necrotic foul-smelling sacral decubitus ulcer ?- Decubitus ulcer occurred while at skilled nursing facility after the last discharge ?- Since he has a chronic Foley and it is difficult to tell if he truly has a UTI, we are unable to completely rule out a UT  ?-Blood cultures  negative so far ? ?Prostate cancer metastatic to bone [facility]) ?- Extensively metastatic prostate cancer with loss of appetite, uncontrollable pain, paraplegia, chronic Foley ?-Now with a severe sacral decubitus ulcer ?- I had an extensive conversation with his daughter and his ex-wife and explained that he would benefit from hospice and comfort care-daughter is to speak with her brother both of whom are the next of kin and will have further discussions with me later today ? ?Addendum: His daughter was not able to speak to her brother. She is flying here and should be here in the AM. Have told her we will hold off on taking him to the OR tomorrow and will speak more in the AM. ? ? ? ?DVT prophylaxis:  enoxaparin (LOVENOX) injection 40 mg Start: 06/20/21 2200 ?SCDs Start: 06/20/21 1351 ? ? ?  Code Status: Full Code  ?Level of Care: Level of care: Telemetry ?Disposition Plan:  ?Status is: Inpatient ?Remains inpatient appropriate because: infected sacral decubitus ulcer ? ?Objective: ?  ?Vitals:  ? 06/20/21 2124 06/21/21 0034 06/21/21 0507 06/21/21 1427  ?BP:  138/67 (!) 128/53 112/61  ?Pulse:  83 98 92  ?Resp:  18  20  ?Temp:  98.3 ?F (36.8 ?C)  99.3 ?F (37.4 ?C)  ?TempSrc:  Oral  Oral  ?SpO2:  94% 90% 99%  ?Height: '5\' 10"'$  (1.778 m)     ? ?There were no vitals filed for this visit. ?Exam: ?General  exam: Appears comfortable  ?HEENT: PERRLA, oral mucosa dry, weak voice ?Respiratory system: rhonchi and chest congestion noted ?Cardiovascular system: S1 & S2 heard, regular rate and rhythm ?Gastrointestinal system: Abdomen soft, non-tender, nondistended. Normal bowel sounds   ?Central nervous system: Alert and oriented only to person, paraplegic ?Skin:   ? ?Psychiatry:  Mood & affect appropriate.   ? ?Imaging and lab data was personally reviewed ? ? ? CBC: ?Recent Labs  ?Lab 06/20/21 ?1010  ?WBC 13.3*  ?NEUTROABS 9.9*  ?HGB 8.1*  ?HCT 26.1*  ?MCV 99.2  ?PLT 309  ? ?Basic Metabolic Panel: ?Recent Labs  ?Lab 06/20/21 ?1010  06/21/21 ?1546  ?NA 137 134*  ?K 4.9 4.5  ?CL 105 105  ?CO2 20* 21*  ?GLUCOSE 133* 96  ?BUN 36* 39*  ?CREATININE 1.06 1.27*  ?CALCIUM 7.7* 7.5*  ? ?GFR: ?CrCl cannot be calculated (Unknown ideal weight.). ? ?Scheduled Meds: ? vitamin C  500 mg Oral BID  ? celecoxib  200 mg Oral BID  ? chlorhexidine  15 mL Mouth Rinse BID  ? Chlorhexidine Gluconate Cloth  6 each Topical Daily  ? docusate sodium  100 mg Oral BID  ? enoxaparin (LOVENOX) injection  40 mg Subcutaneous Q24H  ? gabapentin  300 mg Oral BID  ? guaiFENesin  600 mg Oral Q12H  ? hydrALAZINE  50 mg Oral TID  ? lidocaine  1 patch Transdermal Q24H  ? mouth rinse  15 mL Mouth Rinse q12n4p  ? methadone  20 mg Oral Q8H  ? metoprolol tartrate  25 mg Oral BID  ? pantoprazole  40 mg Oral BID  ? polyethylene glycol  17 g Oral BID  ? sodium hypochlorite   Irrigation BID  ? ?Continuous Infusions: ? cefTRIAXone (ROCEPHIN)  IV Stopped (06/21/21 1220)  ? lactated ringers 20 mL/hr at 06/21/21 1411  ? metronidazole 500 mg (06/21/21 1411)  ? vancomycin    ? ? ? LOS: 1 day  ? ?Author: ?Debbe Odea  ?06/21/2021 4:35 PM ?   ?

## 2021-06-21 NOTE — Assessment & Plan Note (Addendum)
-   Sepsis likely secondary to large necrotic foul-smelling sacral decubitus ulcer ?- Decubitus ulcer occurred while at skilled nursing facility after the last discharge ?- Since he has a chronic Foley and UA is + for bacteria and WBC, we are unable to completely rule out a UTI- culture was not sent ?-Blood cultures negative so far ?

## 2021-06-21 NOTE — Progress Notes (Addendum)
Pt's daughter left a message that she wanted to hold off on surgery for father tomorrow until she discusses goals of care, (possibly transitioning to comfort care). She will be flying here tomorrow am. ? ?[facility] notified of cancellation. ?

## 2021-06-21 NOTE — Telephone Encounter (Signed)
Called patient (no answer) as well as patient contact "[name]" and left a voicemail in reference to patient [name]. I called to check on patient/ follow up with him after his recent wound care issues. I left my extension [phone] and asked that patient or Ms. [name] return my call, so that I may get an updated status on [name]. ?

## 2021-06-21 NOTE — Progress Notes (Signed)
Patient came in with chronic foley from nursing facility. Per policy Foley catheter was replaced at bedside. See LDA for insertion ?

## 2021-06-21 NOTE — Telephone Encounter (Signed)
TCT patient's facilty, to Altha Harm, DON. Spoke with her and advised that pt was admitted to [facility] yesterday with a huge pressure wound that needs to be surgically debrided. Advised that this is not a "radiation burn" as she had told me on 06/19/21 but a pressure wound from sitting in w/c or sitting in bed without having consistent repositioning. She states pt had consistently refused to get out of his w/c as he told her he "wanted to get stronger" ?Advised to review this with their wound care nurse.  Jonelle Sidle voiced understanding. ?

## 2021-06-21 NOTE — Hospital Course (Addendum)
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  He also had a Foley placed was meant to be permanent. He was supposed to start Zytiga and prednisone but ended up back in the hospital on 06/20/21.  Poor historian/ex-wife and daughter live in [place] and states that he they came to visit him last week and noticed an extreme foul smell coming from him, on visit 4 days prior they were told repeatedly that "he is getting better" but continued to notice the foul smell and therefore request that he be sent to the hospital. ? ?In the ED he was noted to have a temperature of 102.3, heart rate in the 100s, and extensive necrotic area on both of his buttocks, WBC count of 13.3 and was started on IV antibiotics.  General surgery was consulted and and debrided the area.  Wound postdebridement measured 12 x 13 x 6 cm. ?Rapid response was called  as he was "short of breath, anxious, febrile". CXR showed some pulm congestion but patient was not short of breath. ?Patient is being treated for sepsis with large necrotic foul-smelling sacral decubitus ulcer possible UTI in the setting of prostate cancer with mets to bone, cancer pain paraplegia with chronic Foley in place, anemia. ?Palliative care has been consulted for goals of care discussion due to complex nature of his medical illness. ?3/24 night IV line was prolonged the patient overnight refused to have new one in. multiple attempts for IV access difficult placement but patient subsequently refused placement further multiple meetings with palliative care and subsequently patient was transitioned to comfort  measures.  Referred to [facility].  Patient is being discharged to [facility] once family complete the paperwork as they have a bed today ?

## 2021-06-21 NOTE — Progress Notes (Addendum)
?[facility] Progress Note ? ?Patient: Randall Larsen.     ?DJT:[id number]  ?DOA: 06/20/2021   ?  ?  ?Brief hospital course: ? 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 ?He was admitted from 1/23 through 2/21 and treated for cancer associated pain, received radiation as palliation for his pain, was started on methadone and oral Dilaudid and then discharged to a skilled nursing facility.  He also had a Foley placed was meant to be permanent. ?He was supposed to start Zytiga and prednisone today but ended up back in the hospital on 3/14. ?The patient is a poor historian and unable to give me history.  His ex-wife and daughter live in [place] and states that he they came to visit him last week and noticed an extreme foul smell coming from him.  They visited him for 4 days and were told repeatedly that "he is getting better" but continued to notice the foul smell and therefore request that he be sent to the hospital. ?In the ED he was noted to have a temperature of 102.3, heart rate in the 100s, and extensive necrotic area on both of his buttocks, WBC count of 13.3 and was started on IV antibiotics.  General surgery was consulted and and debrided the area.  Wound postdebridement measured 12 x 13 x 6 cm. ? ?The patient has been confused and paranoid in the hospital. ? ?Subjective:  ?The patient appears confused and is unable to answer my questions.    ?Assessment and Plan: ?Assessment and Plan: ?* Sepsis due to undetermined organism [facility]) ?- Sepsis likely secondary to large necrotic foul-smelling sacral decubitus ulcer ?- Decubitus ulcer occurred while at skilled nursing facility after the last discharge ?- Since he has a chronic Foley and it is difficult to tell if he truly has a UTI, we are unable to completely rule out a UT  ?-Blood cultures  negative so far ? ?Prostate cancer metastatic to bone [facility]) ?- Extensively metastatic prostate cancer with loss of appetite, uncontrollable pain, paraplegia, chronic Foley ?-Now with a severe sacral decubitus ulcer ?- I had an extensive conversation with his daughter and his ex-wife and explained that he would benefit from hospice and comfort care-daughter is to speak with her brother both of whom are the next of kin and will have further discussions with me later today ? ?Addendum: His daughter was not able to speak to her brother. She is flying here and should be here in the AM. Have told her we will hold off on taking him to the OR tomorrow and will speak more in the AM. ? ? ? ?DVT prophylaxis:  enoxaparin (LOVENOX) injection 40 mg Start: 06/20/21 2200 ?SCDs Start: 06/20/21 1351 ? ? ?  Code Status: Full Code  ?Level of Care: Level of care: Telemetry ?Disposition Plan:  ?Status is: Inpatient ?Remains inpatient appropriate because: infected sacral decubitus ulcer ? ?Objective: ?  ?Vitals:  ? 06/20/21 2124 06/21/21 0034 06/21/21 0507 06/21/21 1427  ?BP:  138/67 (!) 128/53 112/61  ?Pulse:  83 98 92  ?Resp:  18  20  ?Temp:  98.3 ?F (36.8 ?C)  99.3 ?F (37.4 ?C)  ?TempSrc:  Oral  Oral  ?SpO2:  94% 90% 99%  ?Height: '5\' 10"'$  (1.778 m)     ? ?There were no vitals filed for this visit. ?Exam: ?General  exam: Appears comfortable  ?HEENT: PERRLA, oral mucosa dry, weak voice ?Respiratory system: rhonchi and chest congestion noted ?Cardiovascular system: S1 & S2 heard, regular rate and rhythm ?Gastrointestinal system: Abdomen soft, non-tender, nondistended. Normal bowel sounds   ?Central nervous system: Alert and oriented only to person, paraplegic ?Skin:   ? ?Psychiatry:  Mood & affect appropriate.   ? ?Imaging and lab data was personally reviewed ? ? ? CBC: ?Recent Labs  ?Lab 06/20/21 ?1010  ?WBC 13.3*  ?NEUTROABS 9.9*  ?HGB 8.1*  ?HCT 26.1*  ?MCV 99.2  ?PLT 309  ? ?Basic Metabolic Panel: ?Recent Labs  ?Lab 06/20/21 ?1010  06/21/21 ?1546  ?NA 137 134*  ?K 4.9 4.5  ?CL 105 105  ?CO2 20* 21*  ?GLUCOSE 133* 96  ?BUN 36* 39*  ?CREATININE 1.06 1.27*  ?CALCIUM 7.7* 7.5*  ? ?GFR: ?CrCl cannot be calculated (Unknown ideal weight.). ? ?Scheduled Meds: ? vitamin C  500 mg Oral BID  ? celecoxib  200 mg Oral BID  ? chlorhexidine  15 mL Mouth Rinse BID  ? Chlorhexidine Gluconate Cloth  6 each Topical Daily  ? docusate sodium  100 mg Oral BID  ? enoxaparin (LOVENOX) injection  40 mg Subcutaneous Q24H  ? gabapentin  300 mg Oral BID  ? guaiFENesin  600 mg Oral Q12H  ? hydrALAZINE  50 mg Oral TID  ? lidocaine  1 patch Transdermal Q24H  ? mouth rinse  15 mL Mouth Rinse q12n4p  ? methadone  20 mg Oral Q8H  ? metoprolol tartrate  25 mg Oral BID  ? pantoprazole  40 mg Oral BID  ? polyethylene glycol  17 g Oral BID  ? sodium hypochlorite   Irrigation BID  ? ?Continuous Infusions: ? cefTRIAXone (ROCEPHIN)  IV Stopped (06/21/21 1220)  ? lactated ringers 20 mL/hr at 06/21/21 1411  ? metronidazole 500 mg (06/21/21 1411)  ? vancomycin    ? ? ? LOS: 1 day  ? ?Author: ?Debbe Odea  ?06/21/2021 4:35 PM ?   ?

## 2021-06-21 NOTE — Progress Notes (Signed)
PT Cancellation Note ? ?Patient Details ?Name: Hilery Wintle. ?MRN: [id number] ?DOB: [birth year] ? ? ?Cancelled Treatment:     will defer hydrotherapy today per surgery, may have debridement tomorrow. Will await further orders. ? ? ?Mykeal Carrick, Shella Maxim ?06/21/2021, 10:43 AM ?Tresa Endo PT ?Acute Rehabilitation Services ?Pager [phone] ?Office [phone] ? ?

## 2021-06-21 NOTE — Telephone Encounter (Signed)
Oral Chemotherapy Pharmacist Encounter  ? ?Received voicemail from [facility] regarding [employer] and Prednisone prescriptions. Medications were both received by the facility last night (06/20/21), but she is inquiring as to if to ship them back to the [facility] as patient is now inpatient.  ? ?Left voicemail for Ms. Domingo Cocking that the medications cannot be sent back to the [facility] once they have been shipped out, and that the medications will need to be stored at [facility] along with the patient's other medications at this time.  ? ?Leron Croak, PharmD, BCPS ?Hematology/Oncology Clinical Pharmacist ?[facility] Oral Chemotherapy [facility] ?[phone] ?06/21/2021 1:38 PM ? ?

## 2021-06-21 NOTE — Progress Notes (Addendum)
? ? ?   ?Subjective: ?CC: ?Seen with attending. Patient confused and Patient given pain medication prior to wound assessment.  ? ?Objective: ?Vital signs in last 24 hours: ?Temp:  [98.3 ?F (36.8 ?C)-102.3 ?F (39.1 ?C)] 98.3 ?F (36.8 ?C) (03/15 0034) ?Pulse Rate:  [83-109] 98 (03/15 0507) ?Resp:  [18-21] 18 (03/15 0034) ?BP: (115-142)/(53-76) 128/53 (03/15 0507) ?SpO2:  [90 %-97 %] 90 % (03/15 0507) ?  ? ?Intake/Output from previous day: ?03/14 0701 - 03/15 0700 ?In: 1005 [P.O.:240; I.V.:665; IV Piggyback:100] ?Out: 1600 [Urine:1600] ?Intake/Output this shift: ?Total I/O ?In: 350 [P.O.:350] ?Out: -  ? ?PE: ?Gen: Awake and alert ?Abd: soft, NT, ND ?GU: foley present  ?Psych: confused, emotionally labile  ?Sacral wound - Large sacral wound with necrotic appearing tissue at the base with some thin malodorous drainage without obvious purulence  ? ?Lab Results:  ?Recent Labs  ?  06/20/21 ?1010  ?WBC 13.3*  ?HGB 8.1*  ?HCT 26.1*  ?PLT 309  ? ?BMET ?Recent Labs  ?  06/20/21 ?1010  ?NA 137  ?K 4.9  ?CL 105  ?CO2 20*  ?GLUCOSE 133*  ?BUN 36*  ?CREATININE 1.06  ?CALCIUM 7.7*  ? ?PT/INR ?No results for input(s): LABPROT, INR in the last 72 hours. ?CMP  ?   ?Component Value Date/Time  ? NA 137 06/20/2021 1010  ? K 4.9 06/20/2021 1010  ? CL 105 06/20/2021 1010  ? CO2 20 (L) 06/20/2021 1010  ? GLUCOSE 133 (H) 06/20/2021 1010  ? BUN 36 (H) 06/20/2021 1010  ? CREATININE 1.06 06/20/2021 1010  ? CREATININE 1.05 06/14/2021 1119  ? CALCIUM 7.7 (L) 06/20/2021 1010  ? PROT 6.4 (L) 06/20/2021 1010  ? ALBUMIN 2.2 (L) 06/20/2021 1010  ? AST 18 06/20/2021 1010  ? AST 14 (L) 06/14/2021 1119  ? ALT 17 06/20/2021 1010  ? ALT 14 06/14/2021 1119  ? ALKPHOS 256 (H) 06/20/2021 1010  ? BILITOT 0.7 06/20/2021 1010  ? BILITOT 0.5 06/14/2021 1119  ? GFRNONAA >60 06/20/2021 1010  ? GFRNONAA >60 06/14/2021 1119  ? GFRAA >60 05/03/2018 1410  ? ?Lipase  ?   ?Component Value Date/Time  ? LIPASE 20 04/02/2021 1742  ? ? ?Studies/Results: ?CT Abdomen Pelvis W  Contrast ? ?Result Date: 06/20/2021 ?CLINICAL DATA:  Abdominal pain, acute, nonlocalized EXAM: CT ABDOMEN AND PELVIS WITH CONTRAST TECHNIQUE: Multidetector CT imaging of the abdomen and pelvis was performed using the standard protocol following bolus administration of intravenous contrast. RADIATION DOSE REDUCTION: This exam was performed according to the departmental dose-optimization program which includes automated exposure control, adjustment of the mA and/or kV according to patient size and/or use of iterative reconstruction technique. CONTRAST:  172m OMNIPAQUE IOHEXOL 300 MG/ML  SOLN COMPARISON:  December 2022 FINDINGS: Lower chest: Small left and trace right pleural effusions. Bibasilar atelectasis. Hepatobiliary: No focal liver abnormality is seen. No gallstones, gallbladder wall thickening, or biliary dilatation. Pancreas: Unremarkable. Spleen: Unremarkable. Adrenals/Urinary Tract: Adrenals and kidneys are unremarkable. Bladder is decompressed by Foley and is poorly evaluated. Stomach/Bowel: Stomach is within normal limits. Bowel is normal in caliber. Vascular/Lymphatic: Mild atherosclerosis.  No new enlarged nodes. Reproductive: Prostate appears smaller in size. Partially imaged large hydrocele on the right as seen on prior studies. Other: Body wall edema. Right inguinal hernia containing nonobstructed loops of small bowel as well as a portion of the appendix as before. Musculoskeletal: New posterior subcutaneous soft tissue thickening and gas at and below the level of the coccyx. This extends into the gluteal musculature.  No definite erosive changes to suggest osteomyelitis. Diffuse osseous metastatic disease with progression since the prior study. IMPRESSION: Large decubitus ulcer at and inferior to the level of the coccyx. No definite evidence of osteomyelitis. Progression of osseous metastatic disease with increased loss of vertebral body height. Small left and trace right pleural effusions. Bibasilar  atelectasis. Similar right inguinal hernia containing nonobstructed small bowel and a portion of the appendix. Electronically Signed   By: Macy Mis M.D.   On: 06/20/2021 12:51  ? ?DG Chest Port 1 View ? ?Result Date: 06/20/2021 ?CLINICAL DATA:  Shortness of breath.  Congestion. EXAM: PORTABLE CHEST 1 VIEW COMPARISON:  Multiple plain films, most recent 05/31/2021. CTA chest of 05/01/2021 FINDINGS: The Chin overlies the apices. Mild cardiomegaly. No pleural effusion or pneumothorax. Interstitial coarsening is slightly increased. Suspect mild residual left base airspace disease, most likely atelectasis. Fifth lateral left rib destruction, as detailed previously. IMPRESSION: Cardiomegaly with slight increase in pulmonary interstitial coarsening, suggesting pulmonary venous congestion. Improved left base aeration with probable atelectasis remaining. Electronically Signed   By: Abigail Miyamoto M.D.   On: 06/20/2021 10:09   ? ?Anti-infectives: ?Anti-infectives (From admission, onward)  ? ? Start     Dose/Rate Route Frequency Ordered Stop  ? 06/21/21 2200  vancomycin (VANCOREADY) IVPB 1500 mg/300 mL       ? 1,500 mg ?150 mL/hr over 120 Minutes Intravenous Every 24 hours 06/20/21 2102    ? 06/21/21 1200  cefTRIAXone (ROCEPHIN) 2 g in sodium chloride 0.9 % 100 mL IVPB       ? 2 g ?200 mL/hr over 30 Minutes Intravenous Every 24 hours 06/20/21 1623    ? 06/20/21 1430  vancomycin (VANCOREADY) IVPB 2000 mg/400 mL       ? 2,000 mg ?200 mL/hr over 120 Minutes Intravenous  Once 06/20/21 1417 06/20/21 2247  ? 06/20/21 1400  cefTRIAXone (ROCEPHIN) 2 g in sodium chloride 0.9 % 100 mL IVPB  Status:  Discontinued       ? 2 g ?200 mL/hr over 30 Minutes Intravenous Every 24 hours 06/20/21 1345 06/20/21 1345  ? 06/20/21 1400  metroNIDAZOLE (FLAGYL) IVPB 500 mg       ? 500 mg ?100 mL/hr over 60 Minutes Intravenous Every 12 hours 06/20/21 1345    ? 06/20/21 1315  cefTRIAXone (ROCEPHIN) 2 g in sodium chloride 0.9 % 100 mL IVPB  Status:   Discontinued       ? 2 g ?200 mL/hr over 30 Minutes Intravenous Every 24 hours 06/20/21 1302 06/20/21 1623  ? ?  ? ? ? ?Assessment/Plan ?Unstageable sacral decubitus pressure ulcer ?- S/p bedside debridement 3/15 ?- Patient assessed with attending today. Still with large necrotic sacral wound that will likely need OR for debridement. Will reach out to family today to discuss and confirm timing of this with attending.  ?- CT w/o evidence of osteo ?- Pressure relief with air overlay mattress and frequent turning  ?  ?FEN: HH diet. NPO at midnight. IVF per [facility] ?VTE: SCDs, Lovenox ?ID: Rocephin/flagyl/vanc. Tmax 102.3 yesterday. Afebrile since midnight. WBC 13.3 yesterday, no labs today to review.  ?  ?- below per [facility] -  ?UTI - Indwelling foley, management per primary attending. On abx. ?Metastatic prostate cancer s/p radiation to lumbar spine, right hip/pelvis, sternum, left rib and T3-T9 spine.  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 ?I reviewed nursing notes, ED provider notes, hospitalist notes, last 24 h vitals and  pain scores, last 48 h intake and output, last 24 h labs and trends, and last 24 h imaging results. ? ? ? LOS: 1 day  ? ? ?Jillyn Ledger , PA-C ?[facility] ?06/21/2021, 8:36 AM ?Please see Amion for pager number during day hours 7:00am-4:30pm ? ?

## 2021-06-21 NOTE — Consult Note (Signed)
WOC Nurse Consult Note: ?Reason for Consult:Unstageable pressure injury to sacral/buttocks area, present on admission from SNF. Wound has been conservatively debrided at bedside and now plans are shifting towards surgical debridement.  Talks with family are being arranged.  Will implement Dakins to soften devitalized tissue while awaiting surgical plan.  Please reconsult if surgery is cancelled or new treatment plan is needed.  ?Wound type:Unstageable pressure injury.   ?Pressure Injury POA: Yes ?Measurement: 12 cm x 13.5 cm wound bed is not visible at deepest area. But can appreciate >4 cm defect present.  ?Wound bed: 60% gray and black devitalized tissue  40% pale pink nongranulating, friable tissue.  ?Drainage (amount, consistency, odor) moderate tan effluent  necrotic odor.  ?Periwound:erythematous and warm ?Dressing procedure/placement/frequency: Cleanse sacral wound with NS and pat dry. Dakins moist kerlix to wound bed twice daily.  Cover with dry gauze and ABD pads/tape.  Awaiting surgery.  ?Will not follow at this time.  Please re-consult if needed.  ?Domenic Moras MSN, RN, FNP-BC CWON ?Wound, Ostomy, Continence Nurse ?Pager [phone]  ? ? ? ?  ?

## 2021-06-21 NOTE — Assessment & Plan Note (Addendum)
-   Extensively metastatic prostate cancer with uncontrollable pain in back, sacral area and chest, paraplegia, chronic Foley ?- family does not want escalation of care per discussions yesterday but also has not decided on comfort care either ? ?
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
## 2021-06-21 NOTE — Progress Notes (Addendum)
Please see separate progress note. Patient seen with attending this AM and found to have a large necrotic sacral pressure ulcer. We recommended OR for debridement. Patient is not orientated and unable to provide consent. I reached out via phone to patient's wife Lanier Ensign and Daughter Aizen Duval who I spoke with jointly over speaker phone. I explained the recommendation for taking the patient to the OR for debridement. We discussed indication, procedure, risks, and aftercare. Risks include but are not limited to anesthesia (MI, CVA, Death, aspiration, prolonged intubation), pain, bleeding, infection, injury to surrounding structures and need for additional procedures. We discussed the wound would remain open post op and heal by secondary intention. We discussed given his nutritional status and underlying paralysis, his wound may never heal. We discussed typically wound care and evaluation/monitoring of the wound after the procedure. We discussed the expectation he will need to go back to a skilled nursing facility after discharge (per notes, came from [facility]). Time was taken for a questions and concerns related to his sacral wound to be addressed. Family is agreeable to proceed with planned procedure. Will reach out to primary team to let them know family would also like to speak to them. Also will ask primary team to ensure patient is medically cleared and optimized for surgery.  ? ?Alferd Apa, PA-C ?[facility] ?06/21/2021, 10:43 AM ? ?

## 2021-06-22 ENCOUNTER — Ambulatory Visit: Payer: Medicare HMO | Admitting: Urology

## 2021-06-22 ENCOUNTER — Inpatient Hospital Stay (HOSPITAL_COMMUNITY): Payer: Medicare HMO

## 2021-06-22 ENCOUNTER — Encounter (HOSPITAL_COMMUNITY)
Admission: EM | Disposition: A | Discharge status: Hospice | Payer: Self-pay | Source: Home / Self Care | Attending: Internal Medicine

## 2021-06-22 DIAGNOSIS — R0603 Acute respiratory distress: Secondary | ICD-10-CM

## 2021-06-22 DIAGNOSIS — N390 Urinary tract infection, site not specified: Secondary | ICD-10-CM

## 2021-06-22 DIAGNOSIS — Z515 Encounter for palliative care: Secondary | ICD-10-CM

## 2021-06-22 DIAGNOSIS — Z66 Do not resuscitate: Secondary | ICD-10-CM

## 2021-06-22 DIAGNOSIS — C61 Malignant neoplasm of prostate: Secondary | ICD-10-CM

## 2021-06-22 DIAGNOSIS — Z7189 Other specified counseling: Secondary | ICD-10-CM

## 2021-06-22 DIAGNOSIS — R52 Pain, unspecified: Secondary | ICD-10-CM

## 2021-06-22 DIAGNOSIS — R319 Hematuria, unspecified: Secondary | ICD-10-CM

## 2021-06-22 DIAGNOSIS — L89150 Pressure ulcer of sacral region, unstageable: Secondary | ICD-10-CM

## 2021-06-22 DIAGNOSIS — C7951 Secondary malignant neoplasm of bone: Secondary | ICD-10-CM

## 2021-06-22 DIAGNOSIS — A419 Sepsis, unspecified organism: Secondary | ICD-10-CM | POA: Diagnosis not present

## 2021-06-22 DIAGNOSIS — G8220 Paraplegia, unspecified: Secondary | ICD-10-CM

## 2021-06-22 LAB — CBC WITH DIFFERENTIAL/PLATELET
Abs Immature Granulocytes: 1.86 10*3/uL — ABNORMAL HIGH (ref 0.00–0.07)
Basophils Absolute: 0 10*3/uL (ref 0.0–0.1)
Basophils Relative: 0 %
Eosinophils Absolute: 0 10*3/uL (ref 0.0–0.5)
Eosinophils Relative: 0 %
HCT: 20.8 % — ABNORMAL LOW (ref 39.0–52.0)
Hemoglobin: 6.6 g/dL — CL (ref 13.0–17.0)
Immature Granulocytes: 16 %
Lymphocytes Relative: 7 %
Lymphs Abs: 0.8 10*3/uL (ref 0.7–4.0)
MCH: 31.3 pg (ref 26.0–34.0)
MCHC: 31.7 g/dL (ref 30.0–36.0)
MCV: 98.6 fL (ref 80.0–100.0)
Monocytes Absolute: 1 10*3/uL (ref 0.1–1.0)
Monocytes Relative: 8 %
Neutro Abs: 7.8 10*3/uL — ABNORMAL HIGH (ref 1.7–7.7)
Neutrophils Relative %: 69 %
Platelets: 253 10*3/uL (ref 150–400)
RBC: 2.11 MIL/uL — ABNORMAL LOW (ref 4.22–5.81)
RDW: 22 % — ABNORMAL HIGH (ref 11.5–15.5)
WBC: 11.4 10*3/uL — ABNORMAL HIGH (ref 4.0–10.5)
nRBC: 2.2 % — ABNORMAL HIGH (ref 0.0–0.2)

## 2021-06-22 LAB — CREATININE, SERUM
Creatinine, Ser: 1.22 mg/dL (ref 0.61–1.24)
GFR, Estimated: >60 mL/min (ref >60)

## 2021-06-22 IMAGING — DX DG CHEST 1V PORT
1 series · 1 of 1 positions shown · non-contrast
Comparison: CT chest [DATE], chest x-ray [DATE]

CLINICAL DATA: Respiratory distress

EXAM:
PORTABLE CHEST 1 VIEW

[chest ap]
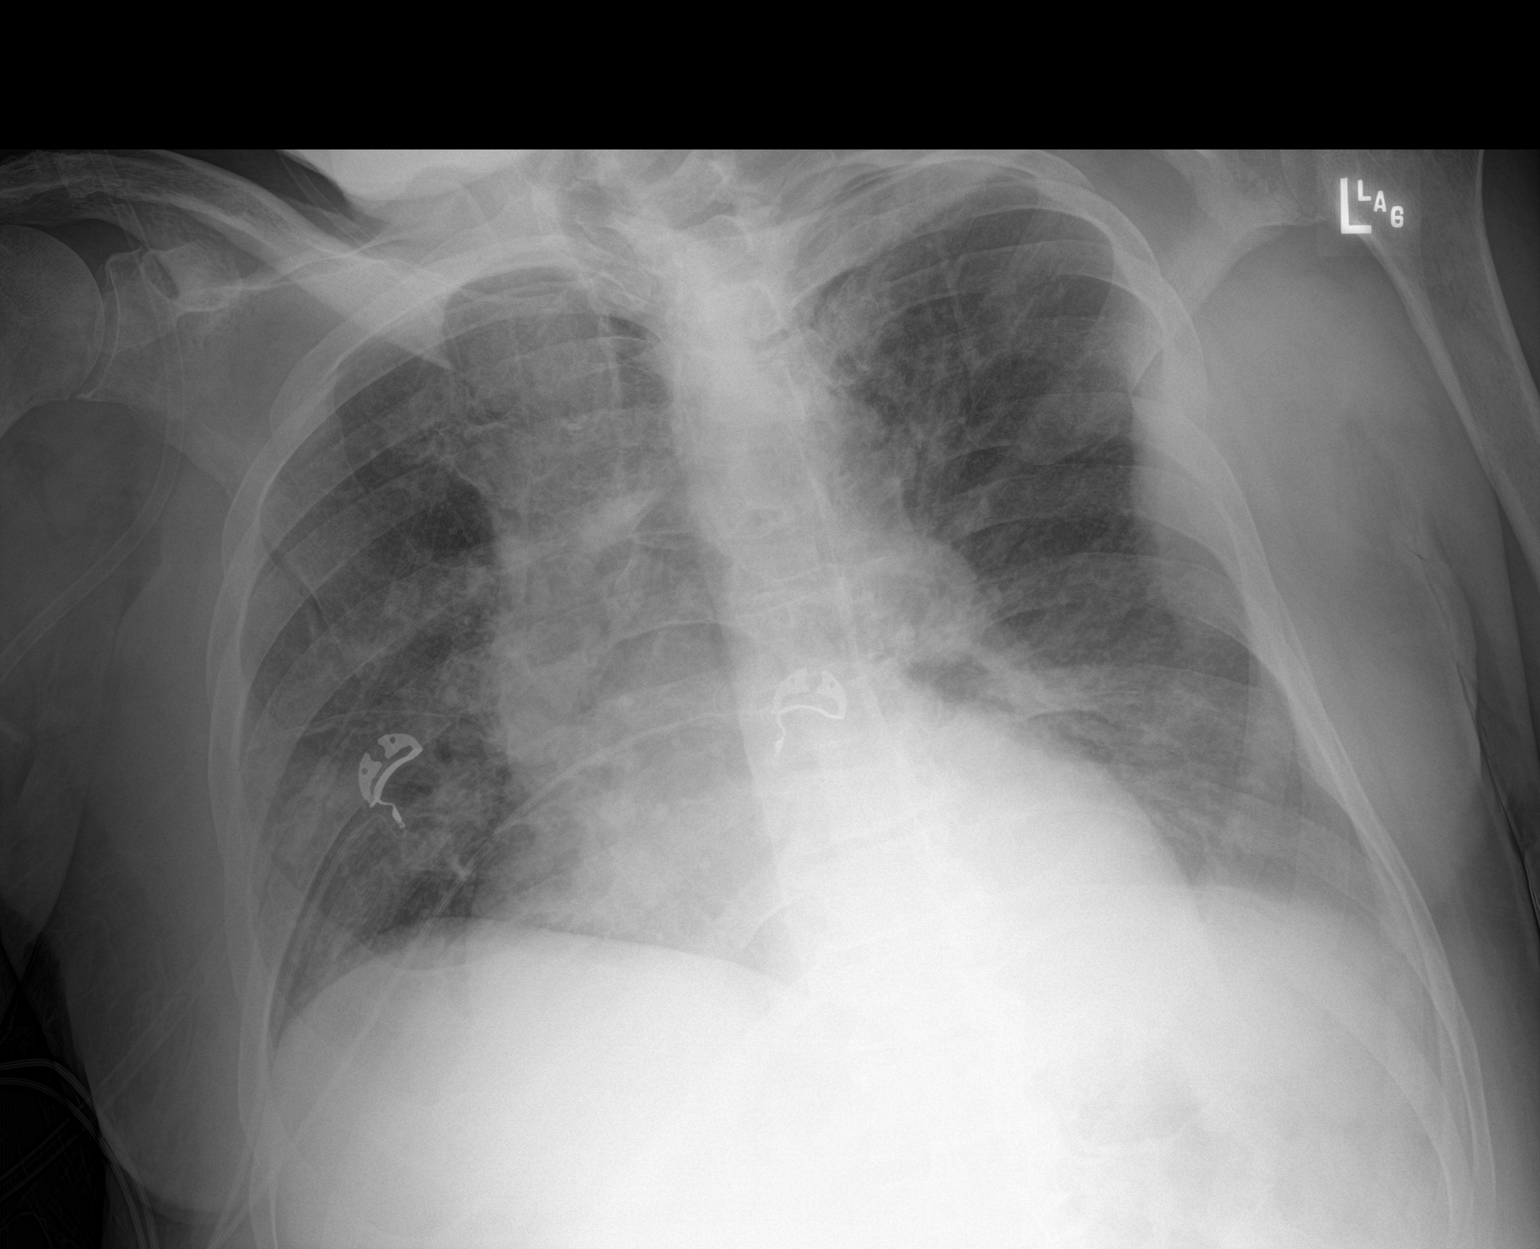

[1 of 1 positions shown; findings below may reference images not displayed]

FINDINGS: Stable enlarged cardiac silhouette with ectatic aorta. Central
venous congestion pattern increased from prior. Early airspace
disease in the RIGHT lower lobe. No pneumothorax.

LEFT rib metastasis noted
IMPRESSION: Increasing venous congestion.

Potential early pneumonia in the RIGHT lower lobe.

## 2021-06-22 SURGERY — DEBRIDEMENT, WOUND, WITH CLOSURE
Anesthesia: General

## 2021-06-22 MED ORDER — HYDROMORPHONE HCL 1 MG/ML IJ SOLN
1.0000 mg | INTRAMUSCULAR | Status: DC | PRN
  Administered 2021-06-22 – 2021-06-23 (×4): 1 mg via INTRAVENOUS
  Filled 2021-06-22 (×4): qty 1

## 2021-06-22 NOTE — Assessment & Plan Note (Signed)
Chronic cancer pain in back and rib and acute pain related to sacral ulcer ?- I have added IV Dilaudid to the Methadone and oral Dilaudid-  ?

## 2021-06-22 NOTE — Assessment & Plan Note (Addendum)
I have had conversations with his family regards his poor prognosis related to extensive necrosis of sacral area superimposed on underlying extensively metastatic cancer and severe pain related to both ?- at this point they are hesitant to pursue comfort care- we will continue aggressive care ?

## 2021-06-22 NOTE — Significant Event (Addendum)
Rapid Response Event Note  ? ?Reason for Call :  ?Patient short of breath, anxious, febrile. ? ?Initial Focused Assessment: ?Lung sounds mixed rhonchi, diminished, with tachypnea. Diffuse peripheral edema in all four extremities. Patient repeatedly verbalized emotional concerns about family and his life history.  ? ?Interventions:  ?Assessment and vitals completed. Removed blankets, cooled down room, ice packs applied to reduce fever. Breathing treatment already administered. Chart reviewed with provider at bedside. Repositioned patient and scheduled meds administered (see MAR). CXR done. Provided active listening and reassurance to patient.  ? ?Plan of Care:  ?Patient will remain at current level of care at this time. Per documentation, patient and family have been discussing goals of care, but have yet to determine comfort vs. Aggressive treatment. Bedside nurse will call rapid response nurse if patient declines further which may prompt more urgent discussion with family.  ? ? ?Event Summary:  ? ?MD Notified: Gershon Cull, NP ?Call Time: 2050 ?Arrival Time: 2100 ?End Time: 2130 ? ?Selinda Michaels, RN ?

## 2021-06-22 NOTE — Progress Notes (Signed)
Made aware family would like to hold off on surgery today and further goals of care discussions are undergoing. We will follow peripherally until final goals of care can be established. We will be available with any questions or concerns.  ? ?Alferd Apa, PA-C ?[facility]  ?06/22/21, 7:12 AM ? ?

## 2021-06-22 NOTE — Progress Notes (Signed)
PT Cancellation Note ? ?Patient Details ?Name: Randall Larsen. ?MRN: [id number] ?DOB: 14-Jul-1955 ? ? ?Cancelled Treatment:    Reason Eval/Treat Not Completed: Other (comment)will hold on hydrotherapy until further notice. GOC being addressed.Please reorder if indicated. ?Tresa Endo PT ?Acute Rehabilitation Services ?Pager [phone] ?Office [phone] ? ? ? ?Damonica Chopra, Shella Maxim ?06/22/2021, 7:56 AM ?

## 2021-06-22 NOTE — Plan of Care (Addendum)
Pt's hgb at 6.4. Hospitalist notified. Wound care tolerated by pt. ? ?Problem: Coping: ?Goal: Level of anxiety will decrease ?Outcome: Progressing ?  ?Problem: Elimination: ?Goal: Will not experience complications related to urinary retention ?Outcome: Progressing ?  ?Problem: Safety: ?Goal: Ability to remain free from injury will improve ?Outcome: Progressing ?  ?

## 2021-06-22 NOTE — Progress Notes (Signed)
Rapid response called due to pt having increasing difficulty with speaking due to shortness of breath. Temperature slightly elevated at 100.4 and tachycardic. Ice packs placed on pt to reduce temp., tylenol and pain meds given. Pt repositioned and chest xray ordered. Pt expressing he was "not a good father" and his family "deserved better", saying he was sorry over and over. Emotional support given to pt and encouraged to rest.  ?Pt could only take a few pills at a time and then asked for no more until later. He also expressed he did not want any more "shots" of the blood thinner. ? ? ?

## 2021-06-22 NOTE — Progress Notes (Signed)
?[facility] Progress Note ? ?Patient: Randall Larsen.     ?XNA:[id number]  ?DOA: 06/20/2021   ?  ?  ?Brief hospital course: ? 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 ?He was admitted from 1/23 through 2/21 and treated for cancer associated pain, received radiation as palliation for his pain, was started on methadone and oral Dilaudid and then discharged to a skilled nursing facility.  He also had a Foley placed was meant to be permanent. ?He was supposed to start Zytiga and prednisone today but ended up back in the hospital on 3/14. ?The patient is a poor historian and unable to give me history.  His ex-wife and daughter live in [place] and states that he they came to visit him last week and noticed an extreme foul smell coming from him.  They visited him for 4 days and were told repeatedly that "he is getting better" but continued to notice the foul smell and therefore request that he be sent to the hospital. ?In the ED he was noted to have a temperature of 102.3, heart rate in the 100s, and extensive necrotic area on both of his buttocks, WBC count of 13.3 and was started on IV antibiotics.  General surgery was consulted and and debrided the area.  Wound postdebridement measured 12 x 13 x 6 cm. ? Initially very confused and paranoid. Now unable to tell me the year or where he is but is able to tell me that he has prostate cancer which is "very bad" and seems to be aware that he has a "sore on his buttocks". He  seems very anxious on exam and in pain. When trying to lay him flat to examine his wound, he begins to start moaning with pain.  ? ?Subjective:  ?I have evaluated him twice today. Complains of pain in chest multiple times and then pain in buttocks. Crying because he does not want to worry his family. ? ?Assessment and  Plan: ?Assessment and Plan: ?* Sepsis due to undetermined organism [facility]) ?- Sepsis likely secondary to large necrotic foul-smelling sacral decubitus ulcer ?- Decubitus ulcer occurred while at skilled nursing facility after the last discharge ?- Since he has a chronic Foley and it is difficult to tell if he truly has a UTI, we are unable to completely rule out a UT  ?-Blood cultures negative so far ? ?Prostate cancer metastatic to bone [facility]) ?- Extensively metastatic prostate cancer with uncontrollable pain in back, sacral area and chest, paraplegia, chronic Foley ? ? ?Goals of care, counseling/discussion ?I have had conversations with his family regards his poor prognosis related to extensive necrosis of sacral area superimposed on underlying extensively metastatic cancer and severe pain related to both ? ?Pain ?Chronic cancer pain in back and rib and acute pain related to sacral ulcer ?- I have added IV Dilaudid to the Methadone and oral Dilaudid-  ? ? ? ? ? ?DVT prophylaxis:  enoxaparin (LOVENOX) injection 40 mg Start: 06/20/21 2200 ?SCDs Start: 06/20/21 1351 ? ? ?  Code Status: DNR  ?Level of Care: Level of care: Telemetry ?Disposition Plan:  ?Status is: Inpatient ?Remains inpatient appropriate because: IV antibiotics and IV pain meds ? ?Objective: ?  ?Vitals:  ? 06/21/21 1925 06/21/21 2051 06/22/21 0342 06/22/21 1502  ?BP: 101/77 131/61 118/65 128/61  ?Pulse: (!) 113 99 95 95  ?  Resp: '18 18 16 '$ (!) 21  ?Temp:  (!) 100.5 ?F (38.1 ?C) 98.2 ?F (36.8 ?C) 98.1 ?F (36.7 ?C)  ?TempSrc:  Oral Oral Oral  ?SpO2: 91% 96% 95% 94%  ?Height:      ? ?There were no vitals filed for this visit. ?Exam: ?General exam: Appears uncomfortable and anxious, tearful at times ?HEENT: PERRLA, oral mucosa moist, no sclera icterus or thrush ?Respiratory system: Clear to auscultation. Respiratory effort normal. ?Cardiovascular system: S1 & S2 heard, regular rate and rhythm ?Gastrointestinal system: Abdomen soft, non-tender, nondistended.  Normal bowel sounds   ?Central nervous system: Alert and oriented to self and some parts of his situation. Not oriented to time or place. parapelgic. ?Extremities: No cyanosis, clubbing or edema ?Skin: unable to examine his sacrum due to pain preventing him from turning ?Psychiatry:  anxious, depressed ? ?Imaging and lab data was personally reviewed ? ? ? CBC: ?Recent Labs  ?Lab 06/20/21 ?1010 06/21/21 ?1546 06/21/21 ?2233 06/22/21 ?0503  ?WBC 13.3* 12.1* 12.2* 11.4*  ?NEUTROABS 9.9* 8.1*  --  7.8*  ?HGB 8.1* 6.7* 6.4* 6.6*  ?HCT 26.1* 21.9* 20.2* 20.8*  ?MCV 99.2 99.5 98.5 98.6  ?PLT 309 259 261 253  ? ?Basic Metabolic Panel: ?Recent Labs  ?Lab 06/20/21 ?1010 06/21/21 ?1546 06/22/21 ?4259  ?NA 137 134*  --   ?K 4.9 4.5  --   ?CL 105 105  --   ?CO2 20* 21*  --   ?GLUCOSE 133* 96  --   ?BUN 36* 39*  --   ?CREATININE 1.06 1.27* 1.22  ?CALCIUM 7.7* 7.5*  --   ? ?GFR: ?CrCl cannot be calculated (Unknown ideal weight.). ? ?Scheduled Meds: ? vitamin C  500 mg Oral BID  ? celecoxib  200 mg Oral BID  ? chlorhexidine  15 mL Mouth Rinse BID  ? Chlorhexidine Gluconate Cloth  6 each Topical Daily  ? docusate sodium  100 mg Oral BID  ? enoxaparin (LOVENOX) injection  40 mg Subcutaneous Q24H  ? gabapentin  300 mg Oral BID  ? guaiFENesin  600 mg Oral Q12H  ? hydrALAZINE  50 mg Oral TID  ? lidocaine  1 patch Transdermal Q24H  ? mouth rinse  15 mL Mouth Rinse q12n4p  ? methadone  20 mg Oral Q8H  ? metoprolol tartrate  25 mg Oral BID  ? pantoprazole  40 mg Oral BID  ? polyethylene glycol  17 g Oral BID  ? sodium hypochlorite   Irrigation BID  ? ?Continuous Infusions: ? cefTRIAXone (ROCEPHIN)  IV 2 g (06/22/21 1305)  ? lactated ringers 20 mL/hr at 06/21/21 1411  ? metronidazole 500 mg (06/22/21 1442)  ? vancomycin 1,500 mg (06/21/21 2109)  ? ? ? LOS: 2 days  ? ?Author: ?Debbe Odea  ?06/22/2021 4:38 PM ?   ?

## 2021-06-22 NOTE — Progress Notes (Signed)
Chaplain was paged to provide support to Eliakim's family as they wanted to complete an Scientist, physiological, Healthcare POA document.  Chaplain provided education around the document.  Through further discussion, Chaplain found that Rakin had not disclosed the severity of his health to his family.  Family voiced that Adell is not accepting of what is happening and they haven't been able to have any conversations with him around his wants and desires.  Chaplain suggested that they have that conversation first before completing the Advanced Directive.   ? ?Chaplain affirmed how Palliative Care can help lead them in how to have that discussion with [facility] medically.  Chaplain also affirmed that because his children are "next-of-kin," they will have decision making power if Naman comes to a place of being unable to speak for himself.  They are also making decisions in conjunction with Deavon's ex-wife amicably.  Owais desires for his ex-wife to also be medically involved.  ? ?Chaplain offered support, education, listening and presence.  ? ? ? 06/22/21 1400  ?Clinical Encounter Type  ?Visited With Patient and family together  ?Visit Type Follow-up;Social support;Spiritual support  ?Spiritual Encounters  ?Spiritual Needs Literature;Brochure  ?Stress Factors  ?Patient Stress Factors Lack of knowledge;Health changes  ?Family Stress Factors Lack of knowledge  ? ? ?

## 2021-06-22 NOTE — Significant Event (Incomplete Revision)
Rapid Response Event Note  ? ?Reason for Call :  ?Patient short of breath, anxious, febrile. ? ?Initial Focused Assessment:  ? ? ? ? ? ?Interventions:  ? ? ?Plan of Care:  ? ? ? ?Event Summary:  ? ?MD Notified:  ?Call Time: ?Arrival Time: ?End Time: ? ?Selinda Michaels, RN ?

## 2021-06-23 DIAGNOSIS — A419 Sepsis, unspecified organism: Secondary | ICD-10-CM | POA: Diagnosis not present

## 2021-06-23 DIAGNOSIS — R52 Pain, unspecified: Secondary | ICD-10-CM | POA: Diagnosis not present

## 2021-06-23 DIAGNOSIS — G8220 Paraplegia, unspecified: Secondary | ICD-10-CM | POA: Diagnosis not present

## 2021-06-23 DIAGNOSIS — Z7189 Other specified counseling: Secondary | ICD-10-CM | POA: Diagnosis not present

## 2021-06-23 LAB — BASIC METABOLIC PANEL
Anion gap: 10 (ref 5–15)
BUN: 36 mg/dL — ABNORMAL HIGH (ref 8–23)
CO2: 17 mmol/L — ABNORMAL LOW (ref 22–32)
Calcium: 7.8 mg/dL — ABNORMAL LOW (ref 8.9–10.3)
Chloride: 105 mmol/L (ref 98–111)
Creatinine, Ser: 1.12 mg/dL (ref 0.61–1.24)
GFR, Estimated: >60 mL/min (ref >60)
Glucose, Bld: 82 mg/dL (ref 70–99)
Potassium: 3.9 mmol/L (ref 3.5–5.1)
Sodium: 132 mmol/L — ABNORMAL LOW (ref 135–145)

## 2021-06-23 LAB — CBC WITH DIFFERENTIAL/PLATELET
Abs Immature Granulocytes: 2.73 10*3/uL — ABNORMAL HIGH (ref 0.00–0.07)
Basophils Absolute: 0.1 10*3/uL (ref 0.0–0.1)
Basophils Relative: 0 %
Eosinophils Absolute: 0 10*3/uL (ref 0.0–0.5)
Eosinophils Relative: 0 %
HCT: 25.9 % — ABNORMAL LOW (ref 39.0–52.0)
Hemoglobin: 8 g/dL — ABNORMAL LOW (ref 13.0–17.0)
Immature Granulocytes: 18 %
Lymphocytes Relative: 7 %
Lymphs Abs: 1.1 10*3/uL (ref 0.7–4.0)
MCH: 31.9 pg (ref 26.0–34.0)
MCHC: 30.9 g/dL (ref 30.0–36.0)
MCV: 103.2 fL — ABNORMAL HIGH (ref 80.0–100.0)
Monocytes Absolute: 1.1 10*3/uL — ABNORMAL HIGH (ref 0.1–1.0)
Monocytes Relative: 7 %
Neutro Abs: 10.3 10*3/uL — ABNORMAL HIGH (ref 1.7–7.7)
Neutrophils Relative %: 68 %
Platelets: 286 10*3/uL (ref 150–400)
RBC: 2.51 MIL/uL — ABNORMAL LOW (ref 4.22–5.81)
RDW: 22.1 % — ABNORMAL HIGH (ref 11.5–15.5)
WBC: 15.3 10*3/uL — ABNORMAL HIGH (ref 4.0–10.5)
nRBC: 1.7 % — ABNORMAL HIGH (ref 0.0–0.2)

## 2021-06-23 MED ORDER — HYDROMORPHONE HCL 1 MG/ML IJ SOLN
1.0000 mg | INTRAMUSCULAR | Status: DC | PRN
  Administered 2021-06-24 – 2021-06-25 (×4): 2 mg via INTRAVENOUS
  Filled 2021-06-23 (×4): qty 2

## 2021-06-23 MED ORDER — FUROSEMIDE 10 MG/ML IJ SOLN
40.0000 mg | Freq: Once | INTRAMUSCULAR | Status: AC
  Administered 2021-06-23: 40 mg via INTRAVENOUS
  Filled 2021-06-23: qty 4

## 2021-06-23 NOTE — Progress Notes (Addendum)
?[facility] Progress Note ? ?Patient: Randall Larsen.     ?BDZ:[id number]  ?DOA: 06/20/2021   ?  ?  ?Brief hospital course: ? 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 ?He was admitted from 1/23 through 2/21 and treated for cancer associated pain, received radiation as palliation for his pain, was started on methadone and oral Dilaudid and then discharged to a skilled nursing facility.  He also had a Foley placed was meant to be permanent. ?He was supposed to start Zytiga and prednisone today but ended up back in the hospital on 3/14. ?The patient is a poor historian and unable to give me history.  His ex-wife and daughter live in [place] and states that he they came to visit him last week and noticed an extreme foul smell coming from him.  They visited him for 4 days and were told repeatedly that "he is getting better" but continued to notice the foul smell and therefore request that he be sent to the hospital. ?In the ED he was noted to have a temperature of 102.3, heart rate in the 100s, and extensive necrotic area on both of his buttocks, WBC count of 13.3 and was started on IV antibiotics.  General surgery was consulted and and debrided the area.  Wound postdebridement measured 12 x 13 x 6 cm. ? ?Rapid response called last night as patient was "short of breath, anxious, febrile". CXR showed some pulm congestion but patient was not short of breath when I re-evaluated him this AM. Lungs clear on exam today, no cough, lungs clear on exam, no tachypnea.  ? ?Family does not yet want comfort care. Will ask general surgery to debride wound and hopefully be able to dc back to SNF. ? ?Subjective:  ?He is in pain in his chest and back today.  ? ?Assessment and Plan: ?Assessment and Plan: ?* Sepsis due to undetermined organism [facility]) ?- Sepsis  likely secondary to large necrotic foul-smelling sacral decubitus ulcer ?- Decubitus ulcer occurred while at skilled nursing facility after the last discharge ?- Since he has a chronic Foley and UA is + for bacteria and WBC, we are unable to completely rule out a UTI- culture was not sent ?-Blood cultures negative so far ? ?Prostate cancer metastatic to bone [facility]) ?- Extensively metastatic prostate cancer with uncontrollable pain in back, sacral area and chest, paraplegia, chronic Foley ?- family does not want escalation of care per discussions yesterday but also has not decided on comfort care either ? ? ?Goals of care, counseling/discussion ?I have had conversations with his family regards his poor prognosis related to extensive necrosis of sacral area superimposed on underlying extensively metastatic cancer and severe pain related to both ?- at this point they are hesitant to pursue comfort care- we will continue aggressive care ? ?Pain ?Chronic cancer pain in back and rib and acute pain related to sacral ulcer ?- I have added IV Dilaudid to the Methadone and oral Dilaudid-  ? ? ? ? ? ?DVT prophylaxis:  enoxaparin (LOVENOX) injection 40 mg Start: 06/20/21 2200 ?SCDs Start: 06/20/21 1351 ?  ?  Code Status: DNR  ?Level of Care: Level of care: Telemetry ?Disposition Plan:  ?Status is: Inpatient ?Remains inpatient appropriate because: IV abx, awaiting decision on comfort care ? ?Objective: ?  ?Vitals:  ? 06/23/21 0100 06/23/21 0455 06/23/21  5277 06/23/21 1454  ?BP: (!) 146/67 114/62 118/62 116/63  ?Pulse: 98 90 94 87  ?Resp: '19 19 17 20  '$ ?Temp: 97.6 ?F (36.4 ?C) 97.7 ?F (36.5 ?C) 98.1 ?F (36.7 ?C) 98.4 ?F (36.9 ?C)  ?TempSrc: Oral Oral  Oral  ?SpO2: 96% 96% 91% 95%  ?Height:      ? ?There were no vitals filed for this visit. ?Exam: ?General exam: Appears comfortable   ?HEENT: PERRLA, oral mucosa moist, no sclera icterus or thrush ?Respiratory system: Clear to auscultation. Respiratory effort normal. ?Cardiovascular  system: S1 & S2 heard, regular rate and rhythm ?Gastrointestinal system: Abdomen soft, non-tender, nondistended. Normal bowel sounds   ?Central nervous system: Alert and oriented. No focal neurological deficits. ?Extremities: No cyanosis, clubbing or edema ?Skin: No rashes or ulcers ?Psychiatry:  anxious ? ?Imaging and lab data was personally reviewed ? ? ? CBC: ?Recent Labs  ?Lab 06/20/21 ?1010 06/21/21 ?1546 06/21/21 ?2233 06/22/21 ?0503 06/23/21 ?0512  ?WBC 13.3* 12.1* 12.2* 11.4* 15.3*  ?NEUTROABS 9.9* 8.1*  --  7.8* 10.3*  ?HGB 8.1* 6.7* 6.4* 6.6* 8.0*  ?HCT 26.1* 21.9* 20.2* 20.8* 25.9*  ?MCV 99.2 99.5 98.5 98.6 103.2*  ?PLT 309 259 261 253 286  ? ?Basic Metabolic Panel: ?Recent Labs  ?Lab 06/20/21 ?1010 06/21/21 ?1546 06/22/21 ?8242 06/23/21 ?0512  ?NA 137 134*  --  132*  ?K 4.9 4.5  --  3.9  ?CL 105 105  --  105  ?CO2 20* 21*  --  17*  ?GLUCOSE 133* 96  --  82  ?BUN 36* 39*  --  36*  ?CREATININE 1.06 1.27* 1.22 1.12  ?CALCIUM 7.7* 7.5*  --  7.8*  ? ?GFR: ?CrCl cannot be calculated (Unknown ideal weight.). ? ?Scheduled Meds: ? vitamin C  500 mg Oral BID  ? celecoxib  200 mg Oral BID  ? chlorhexidine  15 mL Mouth Rinse BID  ? Chlorhexidine Gluconate Cloth  6 each Topical Daily  ? docusate sodium  100 mg Oral BID  ? enoxaparin (LOVENOX) injection  40 mg Subcutaneous Q24H  ? gabapentin  300 mg Oral BID  ? guaiFENesin  600 mg Oral Q12H  ? hydrALAZINE  50 mg Oral TID  ? lidocaine  1 patch Transdermal Q24H  ? mouth rinse  15 mL Mouth Rinse q12n4p  ? methadone  20 mg Oral Q8H  ? metoprolol tartrate  25 mg Oral BID  ? pantoprazole  40 mg Oral BID  ? polyethylene glycol  17 g Oral BID  ? sodium hypochlorite   Irrigation BID  ? ?Continuous Infusions: ? cefTRIAXone (ROCEPHIN)  IV 2 g (06/23/21 1148)  ? lactated ringers 20 mL/hr at 06/21/21 1411  ? metronidazole 500 mg (06/23/21 1322)  ? vancomycin 1,500 mg (06/22/21 2132)  ? ? ? LOS: 3 days  ? ?Author: ?Debbe Odea  ?06/23/2021 3:05 PM ?   ?

## 2021-06-23 NOTE — Progress Notes (Signed)
Contacted by primary team that family would like the patient to have surgery. I reached out via phone to patient's Daughter Trejan Buda and ex-wife Lanier Ensign who were together and I spoke with jointly over speaker phone. I offered to meet them in person but they wished to have the conversation over the phone. I reviewed palliative's notes on [facility] discussions.  Upon further discussion it appears the patient's family does not wish to pursue surgical intervention.  They inquired about hydrotherapy and if this would be able to treat the wound.  I discussed that given the extensive necrotic tissue of the sacral decubitus ulcer I suspect this will not be able to fully debride the sacral wound.  I have reviewed the indications, procedure, risks and aftercare of surgery with the family.  They wish to have more time to discuss before making a decision.  We will follow up on goals of care decisions. ? ?I notified primary team of my conversation with the patient's family.  Request for joint discussion with family was made.  We were able to make make a three-way phone call between Dr. Wynelle Cleveland, myself and patient's family including patient's daughter Dan Europe, son Carylon Perches, and ex-wife Kathalene Frames.  We again discussed in detail the patient has a necrotic sacral decubitus ulcer that may have underlying infection contributing to his acute illness.  Dr. Wynelle Cleveland expressed her concerns of rising WBC despite IV antibiotics.  There is also concern about patient's pain when having to be rolled to assess the wound.  We discussed surgical debridement would be to treat the possible underlying infection and debride the necrotic wound but he would remain with large sacral wound requiring ongoing dressing changes, turning to assess the wound etc.  We discussed given his nutritional status and underlying paralysis, his wound may never heal.  We rediscussed risks of surgery including but are not limited to anesthesia (MI, CVA, Death, aspiration,  prolonged intubation), pain, bleeding, infection and need for additional procedures.  Family would like time to discuss surgical interventions versus goal of making the patient comfortable. They understand without surgery the patients acute illness may worsen and lead to his demise. Our team will follow peripherally until a decision on surgery is made.  I discussed with Dr. Wynelle Cleveland after the phone call who was agreeable to reach out to our team once a decision has been made. ? ?Alferd Apa, PA-C ?[facility] ?4:35 PM, 06/23/21 ? ?

## 2021-06-23 NOTE — Plan of Care (Signed)
Pain management improved with IV dilaudid. ?Problem: Coping: ?Goal: Level of anxiety will decrease ?Outcome: Progressing ?  ?Problem: Elimination: ?Goal: Will not experience complications related to bowel motility ?Outcome: Progressing ?Goal: Will not experience complications related to urinary retention ?Outcome: Progressing ?  ?Problem: Pain Managment: ?Goal: General experience of comfort will improve ?Outcome: Progressing ?  ?Problem: Safety: ?Goal: Ability to remain free from injury will improve ?Outcome: Progressing ?  ?Problem: Skin Integrity: ?Goal: Risk for impaired skin integrity will decrease ?Outcome: Progressing ?  ?

## 2021-06-23 NOTE — Consult Note (Addendum)
? ?Palliative Care Consult Note  ?                                ?Date: 06/23/2021  ? ?Patient Name: Randall Larsen.  ?DOB: [birth year]  MRN: [id number]  Age / Sex: 66 y.o., male  ?PCP: Hal Morales, DO ?Referring Physician: Debbe Odea, MD ? ?Reason for Consultation: Establishing goals of care ? ?HPI/Patient Profile: Palliative Care consult requested for goals of care discussion in this 66 y.o. male  with past medical history of metastatic prostate cancer with bone involvement. He initially declined treatment at initial diagnosis (10/2017). 02/2021 established care and found to have metastatic disease with diffuse tumor involvement (iliac and perirectal) s/p Lupron injections and radiation to lumbar spine, sternum, T3-T9, hip/pelvis, and left rib area. Mr. Pedrosa most recently had extended hospitalization (05/2021)due to neoplasm related pain where he also completed palliative radiation. Flaccid parplegia. He was discharged to [facility] rehab center. Patient was  admitted on 06/20/2021 via EMS from [facility] with large pressure decubitus ulcer. CT scan showed no evidence of osteomyelitis. Temperature 102.3, pulse 105. WBC 13.3. UA positive for UTI.  ? ?Past Medical History:  ?Diagnosis Date  ?? Hypertension   ?? Prostate cancer ([place])   ? ? ? ?Subjective:  ? ?This NP Osborne Oman reviewed medical records, received report from team, assessed the patient and then met at the patient's bedside with Mr. Adeel, Guiffre, RN, Dr. Karene Fry (ex-wife), Genelle Gather (daughter),  Carylon Perches (son) and other family members to discuss diagnosis, prognosis, GOC, EOL wishes disposition and options. ? ?Mr. Cardenas is awake and alert to family, situation, and medical team. He recognizes myself and Jarrett Soho, Therapist, sports. Ill-appearing, uncomfortable, tearful expressing significant pain. Foul odor from sacral wound. Patient is expressing fear of what is next for him in addition to fear of  receiving proper care which he states was not the case at [facility]. Assured Mr. Homann he was safe and the medical team is providing necessary care with priority of pain relief.  ? ?Patient is familiar to myself and Palliative. We have actively followed him during previous hospitalizations in addition to supportive care at [facility]  ?  ?Concept of Palliative Care was introduced to his family as specialized medical care for people and their families living with serious illness.  It focuses on providing relief from the symptoms and stress of a serious illness.  The goal is to improve quality of life for both the patient and the family. Values and goals of care important to patient and family were attempted to be elicited. ? ?Patient's family are emotional during their visit over concerns of his condition. They have not seen him in a long period of time until most recently when they visited him for a day at [facility] which they then raised concerns over his condition.  ? ?We discussed at length Mr. Ahles current illness and what it means in the larger context of His on-going co-morbidities. Natural disease trajectory and expectations were discussed. ? ?Dr. Wynelle Cleveland also provided extensive updates. I openly and honestly had discussions and expressed concerns of patient's poor prognosis, inability to show a meaningful recovery, and concerns of his ongoing pain and overall poor condition. Family verbalized understanding.  ? ?Family is inquiring about completing advanced directive documents however advised given his intermittent episodes of confusion this would not be appropriate at this time. Mr. Guerrini is not  able to process his overall condition and does not appear to have a clear understanding of his prognosis. Education provided on [employer] and both [place] being Garment/textile technologist for patient as his next living relatives (his children). Mr. Schools in the past has expressed this verbally to myself  and also shared his approval for Wayne Both (ex-wife) to be involved in decision making.  ? ?Family verbalized understanding of poor prognosis. They would not want patient to continue to suffer. I empathetically approached discussions regarding comfort focused care with recommendations for hospice facility. Detailed education provided on Hospice services outpatient and offered. Family verbalized their understanding and awareness of both hospice's goals and philosophy of care in addition to what comfort focused care would look like while hospitalized.  ? ?Family would like some time to discuss amongst themselves prior to making final decisions. They are clear in expressed wishes to not let patient suffer and to manage pain. Mr. Utz is tearful expressing "why is he suffering" and that he is in constant pain. Apologizes frequently. Assured him no apologies needed.  ? ?He expresses fear of dying and wishes not to die alone as he held my hand. Emotional support provided.  ? ?I discussed the importance of continued conversation with family and their medical providers regarding overall plan of care and treatment options, ensuring decisions are within the context of the patients values and GOCs. ? ?Questions and concerns were addressed. The family was encouraged to call with questions or concerns.  PMT will continue to support holistically as needed. ? ?Objective:  ? ?Primary Diagnoses: ?Present on Admission: ?? Sepsis due to undetermined organism [facility]) ?? Essential hypertension ?? Prostate cancer metastatic to bone [facility]) ?? Flaccid diplegia of lower extremities (HCC) ?? Normocytic anemia ? ? ?Scheduled Meds: ?? vitamin C  500 mg Oral BID  ?? celecoxib  200 mg Oral BID  ?? chlorhexidine  15 mL Mouth Rinse BID  ?? Chlorhexidine Gluconate Cloth  6 each Topical Daily  ?? docusate sodium  100 mg Oral BID  ?? enoxaparin (LOVENOX) injection  40 mg Subcutaneous Q24H  ?? gabapentin  300 mg Oral BID  ?? guaiFENesin  600 mg Oral Q12H   ?? hydrALAZINE  50 mg Oral TID  ?? lidocaine  1 patch Transdermal Q24H  ?? mouth rinse  15 mL Mouth Rinse q12n4p  ?? methadone  20 mg Oral Q8H  ?? metoprolol tartrate  25 mg Oral BID  ?? pantoprazole  40 mg Oral BID  ?? polyethylene glycol  17 g Oral BID  ?? sodium hypochlorite   Irrigation BID  ? ? ?Continuous Infusions: ?? cefTRIAXone (ROCEPHIN)  IV 2 g (06/23/21 1148)  ?? lactated ringers 20 mL/hr at 06/21/21 1411  ?? metronidazole 500 mg (06/23/21 0241)  ?? vancomycin 1,500 mg (06/22/21 2132)  ? ? ?PRN Meds: ?acetaminophen **OR** acetaminophen, HYDROmorphone (DILAUDID) injection, HYDROmorphone, ipratropium-albuterol, lip balm, LORazepam, ondansetron **OR** ondansetron (ZOFRAN) IV ? ?No Known Allergies ? ?Physical Exam ?General: NAD, frail chronically-ill appearing ?Cardiovascular: regular rate and rhythm ?Pulmonary: bilateral rhonchi, congested/weak cough ?Abdomen: soft, nontender, + bowel sounds, tenderness around rib area and sternum ?Extremities:bilateral lower extremity edema  ?Skin: dry, large sacral wound (unstaged), not personally assessed due to pain ?Neurological: Alert to family, location, staff. Some confusion noted. Tearful and very apologetic.  ? ?Vital Signs:  ?BP 118/62 (BP Location: Right Arm)   Pulse 94   Temp 98.1 ?F (36.7 ?C)   Resp 17   Ht $R'5\' 10"'XK$  (1.778 m)  SpO2 91%   BMI 32.61 kg/m?  ?Pain Scale: 0-10 ?  ?Pain Score: Asleep ? ?SpO2: SpO2: 91 % ?O2 Device:SpO2: 91 % ?O2 Flow Rate: .O2 Flow Rate (L/min): 1 L/min ? ?IO: Intake/output summary:  ?Intake/Output Summary (Last 24 hours) at 06/23/2021 1236 ?Last data filed at 06/23/2021 0459 ?Gross per 24 hour  ?Intake 480 ml  ?Output 950 ml  ?Net -470 ml  ? ? ?LBM: Last BM Date : 06/23/21 ?Baseline Weight:   ?Most recent weight:       ? ?Palliative Assessment/Data: PPS 10-20% ? ? ?Advanced Care Planning:  ? ?Primary Decision Maker: ?NEXT OF KIN (Children)  ? ?Code Status/Advance Care Planning: ?DNR ? ?A discussion was had today regarding  advanced directives. Concepts specific to code status, artifical feeding and hydration, continued IV antibiotics and rehospitalization was had.  The difference between a aggressive medical intervention path and a palliat

## 2021-06-23 NOTE — Progress Notes (Signed)
? ?Palliative Medicine Inpatient Follow Up Note ? ? ? ? ?Chart Reviewed. Patient assessed at the bedside. He is resting but appears uncomfortable. Moaning observed. Awakens and reaches out for my hand. Able to call me by name. Tearful and becomes apologetic asking if his family is mad with him. Expressed his family is not mad with him and they are expressing their love for him. He ask me "will you be there!" I inquire further about what he was asking me and he becomes tearful summarizing that I told him I would not let him die alone and that we were here for him. Emotional support provided.  ? ?I had another long discussion with his family Wayne Both, [place], and [place]). Updates provided regarding rapid response event overnight, chest x-ray results, recent lab results, and how all of this pertains to his current illness and poor overall status.  Family verbalized understanding expressing their wishes for him not to suffer however with difficulty making decisions towards comfort.  Daughter shares patient has been awake, alert, laughing, and singing with them on today and this is causing her difficulty in making decisions.  She emotionally shares she is unsure if she can bring herself to agree to hospice and comfort measures seeing him in good spirits.  Emotional support provided.  I explained at length about patient's prognosis is poor he may continue to talk with family and enjoy the moments that he has left with them however medically he has body is not and will not have a meaningful recovery and continues to show signs of decline each day.  She verbalized understanding and appreciation again comparing patient's weak and fatigued state on yesterday compared to today. ? ?Emphasized to family given Mr. Mcdill poor state of health he is at high risk of sudden decompensation and sudden death.  They verbalized understanding confirming DNR/DNI.  Family would like to be called if any further events or rapid responses are  called regardless of the time. ? ?Appropriate questions asked by son and ex-wife regarding hospice, referral process, and understanding of the care he would receive out of fear of returning to his situation as he was perceived to be in at previous facility with lack of care and concern for his wellbeing.  Education provided again on the goals and philosophy of hospice with the understanding their main focus is on patient's comfort, treating them with respect and dignity for what time they have left.  Family verbalized appreciation. ? ?They continue to request additional time to discuss and become comfortable with any final decisions.  Expressed to continue to treat the treatable with consideration of patient's pain and suffering.  Expressed this is priority for him as they know he is in a lot of pain. ? ?Patient continues to complain of pain with occasional discomfort.  Will adjust medications accordingly to offer better relief. ? ?Discussed the importance of continued conversation with family and their  medical providers regarding overall plan of care and treatment options, ensuring decisions are within the context of the patients values and GOCs. ? ? ?Questions addressed and support provided.   ? ?Objective Assessment: ?Vital Signs ?Vitals:  ? 06/23/21 0907 06/23/21 1454  ?BP: 118/62 116/63  ?Pulse: 94 87  ?Resp: 17 20  ?Temp: 98.1 ?F (36.7 ?C) 98.4 ?F (36.9 ?C)  ?SpO2: 91% 95%  ? ? ?Intake/Output Summary (Last 24 hours) at 06/23/2021 1710 ?Last data filed at 06/23/2021 1631 ?Gross per 24 hour  ?Intake 960 ml  ?Output 2250 ml  ?  Net -1290 ml  ? ? ?Gen: Uncomfortable, chronically ill-appearing ?CV: Regular rate and rhythm, no murmurs rubs or gallops ?PULM: Bilateral rhonchi  ?ABD: soft/nontender/nondistended/normal bowel sounds ?EXT: Bilateral lower extremity edema ?Neuro: Alert and oriented x3 with noticeable confusion at times ? ?SUMMARY OF RECOMMENDATIONS   ?Continue with current plan of care per medical team.   Family is clear and expressed wishes to continue to treat the treatable at this time allowing them the opportunity for ongoing family discussions and decision making.  They are not prepared to make any final decisions regarding comfort focused care or hospice.  They do acknowledge request to focus on pain management and would not want patient to suffer. ?Lengthy discussion and updates provided.  Recommendations for comfort and hospice focused care.  Family verbalizes understanding and the reality of patient's poor prognosis with no expected meaningful recovery. ?PMT will continue to support and follow on as needed basis. Please secure chat for urgent needs.  I will be off service returning on Monday.  I will have my palliative colleague to follow along over the weekend and offer support as needed. ? ?Symptom Management:  ?Neoplasm related pain ?Dilaudid IV as needed.  Will increase to 1-2 mg. ?Celebrex 200 mg twice daily ?Gabapentin 300 mg twice daily ?Dilaudid 4 mg by mouth every 3 hours as needed for breakthrough pain.  Patient has not received oral dose since yesterday.  Encourage frequent evaluation of pain and administer medications as needed. ?Methadone 20 mg every 8 hours ?Constipation ?MiraLAX twice daily ?Oral care ?Carmex to lips ?Mouth care twice daily and as needed ?Anxiety ?Ativan 0.5 mg as needed ?Nausea ?Zofran 4 mg every 6 hours as needed ? ?Discussed with Dr. Wynelle Cleveland and family. ? ?Time Total: 55 min ? ?Visit consisted of counseling and education dealing with the complex and emotionally intense issues of symptom management and palliative care in the setting of serious and potentially life-threatening illness.Greater than 50%  of this time was spent counseling and coordinating care related to the above assessment and plan. ? ?Alda Lea, AGPCNP-BC  ?Palliative Medicine Team ?[phone] ? ?Palliative Medicine Team providers are available by phone from 7am to 7pm daily and can be reached  through the team cell phone. Should this patient require assistance outside of these hours, please call the patient's attending physician.  ?
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
## 2021-06-23 NOTE — Progress Notes (Signed)
?   06/22/21 2101  ?Assess: MEWS Score  ?Temp (!) 100.4 ?F (38 ?C)  ?BP (!) 136/59  ?Pulse Rate (!) 107  ?Resp (!) 32  ?SpO2 96 %  ?O2 Device Nasal Cannula  ?Assess: MEWS Score  ?MEWS Temp 0  ?MEWS Systolic 0  ?MEWS Pulse 1  ?MEWS RR 2  ?MEWS LOC 0  ?MEWS Score 3  ?MEWS Score Color Yellow  ?Assess: if the MEWS score is Yellow or Red  ?Were vital signs taken at a resting state? Yes  ?Focused Assessment No change from prior assessment  ?Does the patient meet 2 or more of the SIRS criteria? Yes  ?Does the patient have a confirmed or suspected source of infection? Yes  ?Provider and Rapid Response Notified? Yes  ?MEWS guidelines implemented *See Row Information* Yes  ?Treat  ?MEWS Interventions Administered scheduled meds/treatments;Administered prn meds/treatments;Escalated (See documentation below);Consulted Respiratory Therapy  ?Take Vital Signs  ?Increase Vital Sign Frequency  Yellow: Q 2hr X 2 then Q 4hr X 2, if remains yellow, continue Q 4hrs  ?Escalate  ?MEWS: Escalate Yellow: discuss with charge nurse/RN and consider discussing with provider and RRT  ?Notify: Charge Nurse/RN  ?Name of Charge Nurse/RN Notified Ariel, RN  ?Date Charge Nurse/RN Notified 06/22/21  ?Time Charge Nurse/RN Notified 2100  ?Notify: Provider  ?Provider Name/Title Gershon Cull, NP  ?Date Provider Notified 06/22/21  ?Time Provider Notified 2100  ?Notification Type Page  ?Notification Reason Change in status  ?Provider response En route  ?Date of Provider Response 06/22/21  ?Time of Provider Response 2105  ?Notify: Rapid Response  ?Name of Rapid Response RN Notified Leafy Ro, RN  ?Date Rapid Response Notified 06/22/21  ?Time Rapid Response Notified 2100  ?Document  ?Patient Outcome Stabilized after interventions  ?Assess: SIRS CRITERIA  ?SIRS Temperature  0  ?SIRS Pulse 1  ?SIRS Respirations  1  ?SIRS WBC 0  ?SIRS Score Sum  2  ? ? ?

## 2021-06-23 NOTE — Progress Notes (Signed)
Pharmacy Antibiotic Note ? ?Randall Larsen. is a 66 y.o. male with  on 06/20/2021 with metastatic prostate cancer on lupron and abiraterone PTA and sacral wound presented to the ED on 06/20/21 for wound check and was found to be febrile. Abdominal/pelvis CT on 06/20/21 showed large decubitus ulcer at and inferior to the level of the coccyx with no evidence of osteomyelitis.  He was started on vancomycin, ceftriaxone and flagyl for broad coverage. ? ?Today, 06/23/2021: ?- day #4 abx ?- Tmax 100.4, wbc up 15.3 ?- scr down 1.12 ?- [facility] is holding off on surgical debridement until GoC is determined ? ?Plan: ?- continue with vancomycin 1500 mg IV q24h for est AUC 485, ceftriaxone 2gm q24h and flagyl 500 mg q12h ?- f/u GoC and plan for abx ? ?____________________________________ ? ?Height: '5\' 10"'$  (177.8 cm) ?IBW/kg (Calculated) : 73 ? ?Temp (24hrs), Avg:98.3 ?F (36.8 ?C), Min:97.6 ?F (36.4 ?C), Max:100.4 ?F (38 ?C) ? ?Recent Labs  ?Lab 06/20/21 ?1010 06/21/21 ?1546 06/21/21 ?2233 06/22/21 ?0503 06/22/21 ?8416 06/23/21 ?0512  ?WBC 13.3* 12.1* 12.2* 11.4*  --  15.3*  ?CREATININE 1.06 1.27*  --   --  1.22 1.12  ?LATICACIDVEN 1.9  --   --   --   --   --   ?  ?CrCl cannot be calculated (Unknown ideal weight.).   ? ?No Known Allergies ? ? ?Thank you for allowing pharmacy to be a part of this patient?s care. ? ?Lynelle Doctor ?06/23/2021 11:33 AM ? ?

## 2021-06-23 NOTE — Care Management Important Message (Signed)
Important Message ? ?Patient Details IM Letter placed in Patients room. ?Name: Randall Larsen. ?MRN: [id number] ?Date of Birth: 08/07/1955 ? ? ?Medicare Important Message Given:  Yes ? ? ? ? ?Kerin Salen ?06/23/2021, 10:02 AM ?

## 2021-06-23 NOTE — TOC Progression Note (Signed)
Transition of Care (TOC) - Progression Note  ? ? ?Patient Details  ?Name: Randall Larsen. ?MRN: [id number] ?Date of Birth: December 04, 1955 ? ?Transition of Care (TOC) CM/SW Contact  ?Ross Ludwig, LCSW ?Phone Number: ?06/23/2021, 7:09 PM ? ?Clinical Narrative:    ? ?CSW spoke to Neoma Laming at [facility]) she said patient is LTC and can return on Monday if he is medically ready for discharge. ? ?  ?  ? ?Expected Discharge Plan and Services ?  ?  ?  ?  ?  ?                ?  ?  ?  ?  ?  ?  ?  ?  ?  ?  ? ? ?Social Determinants of Health (SDOH) Interventions ?  ? ?Readmission Risk Interventions ?No flowsheet data found. ? ?

## 2021-06-23 NOTE — Progress Notes (Signed)
Stopped by RN in the hall to assess patient due to sounding "raspy" and coughing up phlegm. Patient with tachypnea and bilateral rhonchi. Breathing treatment given and flutter ordered at this time. Patient had productive cough but swallowed it. RN at bedside post treatment.  ?

## 2021-06-23 NOTE — Progress Notes (Signed)
PT Cancellation Note ? ?Patient Details ?Name: Randall Larsen. ?MRN: [id number] ?DOB: July 21, 1955 ? ? ?Cancelled Treatment:    Reason Eval/Treat Not Completed: Patient declined, no reason specified;Other (comment) Met with pt and family and pt very confused. Explained purpose of hydrotherapy and wound care techniques involved. Answered questions for pt/family and they agreed to have PT provide hydrotherapy if pt can tolerate. Pt premedicated for treatment but cried out in pain from IV meds and then stated "I don't think I can do this" "I don't want to do this". Pt will not likely tolerate hydrotherapy even if he decides to trial again. Attempted to follow up with family to discuss pt's discomfort and that he declined treatment. I do not think he will tolerate it and the discomfort is not likely worth the outcome as his wound has a low chance of healing based on his immobility and nutritional deficits. Discussed with RN and MD. Will follow at a distance for now as pt is having ongoing [place] discussion with family and palliative team. Please place a new hydrotherapy order if pt/family wish to pursue treatment for sacral wound. ?  ?Verner Mould, DPT ?Acute Rehabilitation Services ?Office [phone] ?Pager [phone]  ? ?Randall Larsen ?06/23/2021, 4:25 PM ?

## 2021-06-24 DIAGNOSIS — A419 Sepsis, unspecified organism: Secondary | ICD-10-CM | POA: Diagnosis not present

## 2021-06-24 LAB — CREATININE, SERUM
Creatinine, Ser: 1.06 mg/dL (ref 0.61–1.24)
GFR, Estimated: >60 mL/min (ref >60)

## 2021-06-24 LAB — CBC WITH DIFFERENTIAL/PLATELET
Abs Immature Granulocytes: 0.3 10*3/uL — ABNORMAL HIGH (ref 0.00–0.07)
Band Neutrophils: 5 %
Basophils Absolute: 0 10*3/uL (ref 0.0–0.1)
Basophils Relative: 0 %
Eosinophils Absolute: 0 10*3/uL (ref 0.0–0.5)
Eosinophils Relative: 0 %
HCT: 21.9 % — ABNORMAL LOW (ref 39.0–52.0)
Hemoglobin: 7 g/dL — ABNORMAL LOW (ref 13.0–17.0)
Lymphocytes Relative: 8 %
Lymphs Abs: 1.2 10*3/uL (ref 0.7–4.0)
MCH: 31.5 pg (ref 26.0–34.0)
MCHC: 32 g/dL (ref 30.0–36.0)
MCV: 98.6 fL (ref 80.0–100.0)
Metamyelocytes Relative: 1 %
Monocytes Absolute: 0.6 10*3/uL (ref 0.1–1.0)
Monocytes Relative: 4 %
Myelocytes: 1 %
Neutro Abs: 12.7 10*3/uL — ABNORMAL HIGH (ref 1.7–7.7)
Neutrophils Relative %: 81 %
Platelets: 280 10*3/uL (ref 150–400)
RBC: 2.22 MIL/uL — ABNORMAL LOW (ref 4.22–5.81)
RDW: 21.7 % — ABNORMAL HIGH (ref 11.5–15.5)
WBC: 14.8 10*3/uL — ABNORMAL HIGH (ref 4.0–10.5)
nRBC: 1.7 % — ABNORMAL HIGH (ref 0.0–0.2)
nRBC: 5 /100 WBC — ABNORMAL HIGH

## 2021-06-24 MED ORDER — ACETAMINOPHEN 500 MG PO TABS
1000.0000 mg | ORAL_TABLET | Freq: Once | ORAL | Status: AC
  Administered 2021-06-24: 500 mg via ORAL
  Filled 2021-06-24: qty 2

## 2021-06-24 NOTE — Progress Notes (Signed)
Patient sad and tearful this morning. In severe pain, pain medication given as prescribed per order. Patient asked why he was in pain. This Probation officer reoriented him to his wound on his bottom and that's why he is in such pain. Lunch provided for patient at this time, emotional support given.  ?

## 2021-06-24 NOTE — Progress Notes (Signed)
Patient received hydrotherapy this afternoon, coordinated efforts of PT and nursing staff. Patient medicated prior to procedure. Patient tolerated fair. Now patient looks more relaxed and comfortable, offloaded off sacral area with pillows. Patient said thank you to staff for helping him with his wound care.  ?

## 2021-06-24 NOTE — Progress Notes (Addendum)
?[facility] Progress Note ? ?Patient: Randall Larsen.     ?PNT:[id number]  ?DOA: 06/20/2021   ?PCP: Hal Morales, DO  ? ?  ?  ?Brief hospital course: ? 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 ?He was admitted from 1/23 through 2/21 and treated for cancer associated pain, received radiation as palliation for his pain, was started on methadone and oral Dilaudid and then discharged to a skilled nursing facility.  He also had a Foley placed was meant to be permanent. ?He was supposed to start Zytiga and prednisone today but ended up back in the hospital on 3/14. ?The patient is a poor historian and unable to give me history.  His ex-wife and daughter live in [place] and states that he they came to visit him last week and noticed an extreme foul smell coming from him.  They visited him for 4 days and were told repeatedly that "he is getting better" but continued to notice the foul smell and therefore request that he be sent to the hospital. ?In the ED he was noted to have a temperature of 102.3, heart rate in the 100s, and extensive necrotic area on both of his buttocks, WBC count of 13.3 and was started on IV antibiotics.  General surgery was consulted and and debrided the area.  Wound postdebridement measured 12 x 13 x 6 cm. ?  ? ?Subjective:  ?Having pain when being turned for hydrotherapy ? ?Assessment and Plan: ?  ?* Sepsis due to undetermined organism [facility]) ?- Sepsis likely secondary to large necrotic foul-smelling sacral decubitus ulcer- there is a possibility of a UTI in addition to this ?- Decubitus ulcer occurred while at skilled nursing facility after the last discharge ?- Since he has a chronic Foley and UA is + for bacteria and WBC, we are unable to completely rule out a UTI- culture was not sent ?- Blood cultures  negative   ?- option given for debridement in the OR but family refused ?- he will need extensive hydrotherapy and bedside debridement to removed necrotic tissue which is being done today - see picture below from today- spoke with son, ex-wife & daughter again today in person during hydrotherapy and asked them to look at the wound for themselves- only his daughter decided to look at the wound- son and ex-wife have decided to remain outside the room ? ?Prostate cancer metastatic to bone [facility]) ?- Extensively metastatic prostate cancer with uncontrollable pain in back, sacral area and chest, paraplegia, chronic Foley ?- family does not want comfort ? ?Anemia ?- Hgb dropped from 8 to 7 today- follow ? ?Goals of care, counseling/discussion ?I have had conversations with his family regards his poor prognosis related to extensive necrosis of sacral area superimposed on underlying extensively metastatic cancer and severe pain related to both ?- Palliative care has had multiple conversations ?- Albumin is 1.9 ?- Wound can easily be re-infected with stool and urine ?- at this point they are hesitant to pursue comfort care- we will continue aggressive care ? ?Pain ?Chronic cancer pain in back and rib and acute pain related to sacral ulcer ?- I have added IV Dilaudid to the Methadone and oral Dilaudid-  ? ? ?DVT prophylaxis:  enoxaparin (LOVENOX) injection 40 mg Start: 06/20/21 2200 ?SCDs Start: 06/20/21 1351 ? ?  Code Status: DNR  ?Level of  Care: Level of care: Telemetry ?Disposition Plan:  ?Status is: Inpatient ?Remains inpatient appropriate because: IV antibiotics, needs hydrotherapy for wound, needs SNF after this ? ?Objective: ?  ?Vitals:  ? 06/23/21 1454 06/23/21 2036 06/24/21 0256 06/24/21 1349  ?BP: 116/63 137/65 114/63 (!) 108/55  ?Pulse: 87 (!) 109 86 88  ?Resp: '20 15 18 17  '$ ?Temp: 98.4 ?F (36.9 ?C) 99.4 ?F (37.4 ?C) 98.3 ?F (36.8 ?C) 98.1 ?F (36.7 ?C)  ?TempSrc: Oral Oral Oral Oral  ?SpO2: 95% 93% 98% 92%  ?Height:       ? ?There were no vitals filed for this visit. ?Exam: ?General exam: Appears comfortable  ?HEENT: PERRLA, oral mucosa moist, no sclera icterus or thrush ?Respiratory system: Clear to auscultation. Respiratory effort normal. ?Cardiovascular system: S1 & S2 heard, regular rate and rhythm ?Gastrointestinal system: Abdomen soft, non-tender, nondistended. Normal bowel sounds   ?Central nervous system: Alert and oriented. No focal neurological deficits. ?Extremities: No cyanosis, clubbing + 2 edema of legs (from immobility) ?Skin:   ? ? ? ?Psychiatry:  Mood & affect appropriate.   ? ?Imaging and lab data was personally reviewed ? ? ? CBC: ?Recent Labs  ?Lab 06/20/21 ?1010 06/21/21 ?1546 06/21/21 ?2233 06/22/21 ?0503 06/23/21 ?0175 06/24/21 ?0446  ?WBC 13.3* 12.1* 12.2* 11.4* 15.3* 14.8*  ?NEUTROABS 9.9* 8.1*  --  7.8* 10.3* 12.7*  ?HGB 8.1* 6.7* 6.4* 6.6* 8.0* 7.0*  ?HCT 26.1* 21.9* 20.2* 20.8* 25.9* 21.9*  ?MCV 99.2 99.5 98.5 98.6 103.2* 98.6  ?PLT 309 259 261 253 286 280  ? ?Basic Metabolic Panel: ?Recent Labs  ?Lab 06/20/21 ?1010 06/21/21 ?1546 06/22/21 ?1025 06/23/21 ?8527 06/24/21 ?0446  ?NA 137 134*  --  132*  --   ?K 4.9 4.5  --  3.9  --   ?CL 105 105  --  105  --   ?CO2 20* 21*  --  17*  --   ?GLUCOSE 133* 96  --  82  --   ?BUN 36* 39*  --  36*  --   ?CREATININE 1.06 1.27* 1.22 1.12 1.06  ?CALCIUM 7.7* 7.5*  --  7.8*  --   ? ?GFR: ?CrCl cannot be calculated (Unknown ideal weight.). ? ?Scheduled Meds: ? vitamin C  500 mg Oral BID  ? celecoxib  200 mg Oral BID  ? chlorhexidine  15 mL Mouth Rinse BID  ? Chlorhexidine Gluconate Cloth  6 each Topical Daily  ? docusate sodium  100 mg Oral BID  ? enoxaparin (LOVENOX) injection  40 mg Subcutaneous Q24H  ? gabapentin  300 mg Oral BID  ? guaiFENesin  600 mg Oral Q12H  ? hydrALAZINE  50 mg Oral TID  ? lidocaine  1 patch Transdermal Q24H  ? mouth rinse  15 mL Mouth Rinse q12n4p  ? methadone  20 mg Oral Q8H  ? metoprolol tartrate  25 mg Oral BID  ? pantoprazole  40 mg Oral  BID  ? polyethylene glycol  17 g Oral BID  ? sodium hypochlorite   Irrigation BID  ? ?Continuous Infusions: ? cefTRIAXone (ROCEPHIN)  IV 2 g (06/23/21 1148)  ? lactated ringers 20 mL/hr at 06/21/21 1411  ? metronidazole 500 mg (06/24/21 1352)  ? vancomycin 1,500 mg (06/23/21 2307)  ? ? ? LOS: 4 days  ? ?Author: ?Debbe Odea  ?06/24/2021 4:11 PM ?   ?

## 2021-06-24 NOTE — Progress Notes (Signed)
Physical Therapy Wound Evaluation and Trematment ?Patient Details  ?Name: Randall Larsen. ?MRN: [id number] ?Date of Birth: 1955-06-29 ? ?Today's Date: 06/24/2021 ?Time:  -  ?  ? ?Subjective  ?Subjective Assessment ?Subjective: "why do you have to do this?" ?Patient and Family Stated Goals: family wishes for  hydrotherapy performed ?Date of Onset:  (present on admission) ?Prior Treatments: dressing changes  ?Pain Score: faces--a lot of pain, pt was premedicated with oral and IV meds ?  ? ?Wound Assessment  ?Pressure Injury 06/24/21 Sacrum Medial Unstageable - Full thickness tissue loss in which the base of the injury is covered by slough (yellow, tan, gray, green or brown) and/or eschar (tan, brown or black) in the wound bed. (Active)  ?Wound Image    06/24/21 1700  ?Dressing Type ABD;Moist to dry;Dakin's-soaked gauze 06/24/21 1700  ?Dressing Changed 06/24/21 1700  ?Dressing Change Frequency Twice a day 06/24/21 1700  ?State of Healing Eschar 06/24/21 1700  ?Site / Wound Assessment Brown;Painful;Black 06/24/21 1700  ?% Wound base Red or Granulating 0% 06/24/21 1700  ?% Wound base Yellow/Fibrinous Exudate 70% 06/24/21 1700  ?% Wound base Black/Eschar 30% 06/24/21 1700  ?Peri-wound Assessment Excoriated;Maceration 06/24/21 1700  ?Drainage Amount Copious 06/24/21 1700  ?Drainage Description Purulent;Odor - foul 06/24/21 1700  ?Treatment Cleansed;Debridement (Selective);Hydrotherapy (Pulse lavage);Packing (Saline gauze);Other (Comment) 06/24/21 1700  ? ?Hydrotherapy ?Pulsed lavage therapy - wound location: sacral wound ?Pulsed Lavage with Suction (psi): 12 psi ?Pulsed Lavage with Suction - Normal Saline Used: 1000 mL ?Pulsed Lavage Tip: Tip with splash shield ?Selective Debridement ?Selective Debridement - Location: sacrum ?Selective Debridement - Tools Used: Forceps, Scissors ?Selective Debridement - Tissue Removed: softening eschar, black and gray necrotic tissue  ? ? ?Wound Assessment and Plan  ?Wound Therapy -  Assess/Plan/Recommendations ?Wound Therapy - Clinical Statement: pt with unstageable sacral/gluteal pressure injury. Malodorous, significant necrotic sacral wound with copious amounts of purulent exudate. Pt has been seen by Palliative care and comfort care measures were recommended, family wants full scope/aggressive care at this time. Pt is unable to decide at this time as he is intermittently confused.  Pt dtr, nursing staff and Dr. Wynelle Cleveland were present for session today. Discussed at length with pt's family  the lack of potential for wound healing. General surgery following as well. Will continue to follow for hydrotherapy treatment however there is very limited chance that this wound will heal given pt prognosis, nutritional status and diminished mobility. Again, discussed the physiology of pressure injury and healing process with pt family. They do not seem to grasp the magnitude of the situation and began asking questions about surgery for the wound.  ? ?Wound Therapy - Functional Problem List: paraplegi d/t metastatic prostate cancer ?Factors Delaying/Impairing Wound Healing: Altered sensation, Incontinence, Infection - systemic/local, Immobility,  ? ?Multiple medical problems ?Hydrotherapy Plan: Debridement, Dressing change, Patient/family education, Pulsatile lavage with suction ? ?Wound Therapy - Frequency: 6X / week ?Wound Therapy - Follow Up Recommendations: f/u pulsed lavage with suction, f/u selective debridement, dressing changes by RN ? ?Wound Therapy Goals- ?Improve the function of patient's integumentary system by progressing the wound(s) through the phases of wound healing (inflammation - proliferation - remodeling) by: ?Wound Therapy Goals - Improve the function of patient's integumentary system by progressing the wound(s) through the phases of wound healing by: ?Decrease Necrotic Tissue to: 80 ?Decrease Necrotic Tissue - Progress: Goal set today ?Increase Granulation Tissue to: 20 ?Increase  Granulation Tissue - Progress: Goal set today ?Patient/Family will be able to : verbalize dressing  changes and purpose of hydrotherapy ?Patient/Family Instruction Goal - Progress: Goal set today ?Goals/treatment plan/discharge plan were made with and agreed upon by patient/family: No, Patient unable to participate in goals/treatment/discharge plan and family unavailable ?Time For Goal Achievement: 2 weeks ?Wound Therapy - Potential for Goals: Poor ? ?Goals will be updated until maximal potential achieved or discharge criteria met.  Discharge criteria: when goals achieved, discharge from hospital, MD decision/surgical intervention, no progress towards goals, refusal/missing three consecutive treatments without notification or medical reason. ? ?GP ?   ? ?Charges ?  ?  ?Baxter Flattery, PT ? ?Acute Rehab Dept [facility]) [phone] ?Pager [phone] ? ?06/24/2021 ?  ? ? ?Randall Larsen ?06/24/2021, 5:52 PM ? ?

## 2021-06-24 NOTE — Progress Notes (Signed)
Patient ID: Randall Persing., male   DOB: 13-Feb-1956, 66 y.o.   MRN: [id number] ? ? ? ? ? ?Case discussed this afternoon with Dr. Wynelle Cleveland.  Photographs reviewed.  Charts and surgical note from 07/03/2021 reviewed.  PT note from today reviewed. ? ?Agree that this is an advanced sacral decubitus ulcer which would require operative debridement in the OR under anesthesia.  As the patient is non-ambulatory, the chances that this wound will ever completely heal even with optimal wound care is essentially none.  The goal of surgical debridement would be to create an open wound to expose all surfaces for hydrotherapy and dressing changes in hopes of controlling infection and getting a "clean" wound. ? ?The family previously expressed that they did not desire operative intervention.  However, the surgical service will evaluate again on Monday with hydrotherapy and discuss surgical options again with the family if desired. ? ?Randall Gemma, MD ?[facility] ?A [facility] practice ?Office: [phone] ? ?

## 2021-06-24 NOTE — Progress Notes (Signed)
Sacral wound care provided despite Randall Larsen being in pain he allowed care.  ?Pt has been very thankful and tearful as well. Speaking about missing his mother and how he wants to see her. ?

## 2021-06-25 ENCOUNTER — Encounter (HOSPITAL_COMMUNITY): Payer: Self-pay | Admitting: Internal Medicine

## 2021-06-25 ENCOUNTER — Other Ambulatory Visit: Payer: Self-pay

## 2021-06-25 DIAGNOSIS — A419 Sepsis, unspecified organism: Secondary | ICD-10-CM | POA: Diagnosis not present

## 2021-06-25 DIAGNOSIS — Z7189 Other specified counseling: Secondary | ICD-10-CM | POA: Diagnosis not present

## 2021-06-25 LAB — CULTURE, BLOOD (ROUTINE X 2)
Culture: NO GROWTH
Culture: NO GROWTH
Special Requests: ADEQUATE
Special Requests: ADEQUATE

## 2021-06-25 LAB — CBC WITH DIFFERENTIAL/PLATELET
Abs Immature Granulocytes: 3.28 10*3/uL — ABNORMAL HIGH (ref 0.00–0.07)
Basophils Absolute: 0.1 10*3/uL (ref 0.0–0.1)
Basophils Relative: 0 %
Eosinophils Absolute: 0.1 10*3/uL (ref 0.0–0.5)
Eosinophils Relative: 0 %
HCT: 22.3 % — ABNORMAL LOW (ref 39.0–52.0)
Hemoglobin: 6.9 g/dL — CL (ref 13.0–17.0)
Immature Granulocytes: 20 %
Lymphocytes Relative: 6 %
Lymphs Abs: 1 10*3/uL (ref 0.7–4.0)
MCH: 30.8 pg (ref 26.0–34.0)
MCHC: 30.9 g/dL (ref 30.0–36.0)
MCV: 99.6 fL (ref 80.0–100.0)
Monocytes Absolute: 1 10*3/uL (ref 0.1–1.0)
Monocytes Relative: 6 %
Neutro Abs: 10.8 10*3/uL — ABNORMAL HIGH (ref 1.7–7.7)
Neutrophils Relative %: 68 %
Platelet Morphology: NORMAL
Platelets: 305 10*3/uL (ref 150–400)
RBC: 2.24 MIL/uL — ABNORMAL LOW (ref 4.22–5.81)
RDW: 21.6 % — ABNORMAL HIGH (ref 11.5–15.5)
WBC: 16.3 10*3/uL — ABNORMAL HIGH (ref 4.0–10.5)
nRBC: 2.7 % — ABNORMAL HIGH (ref 0.0–0.2)

## 2021-06-25 LAB — ABO/RH: ABO/RH(D): O POS

## 2021-06-25 LAB — BASIC METABOLIC PANEL
Anion gap: 12 (ref 5–15)
BUN: 32 mg/dL — ABNORMAL HIGH (ref 8–23)
CO2: 19 mmol/L — ABNORMAL LOW (ref 22–32)
Calcium: 7.8 mg/dL — ABNORMAL LOW (ref 8.9–10.3)
Chloride: 104 mmol/L (ref 98–111)
Creatinine, Ser: 0.88 mg/dL (ref 0.61–1.24)
GFR, Estimated: >60 mL/min (ref >60)
Glucose, Bld: 126 mg/dL — ABNORMAL HIGH (ref 70–99)
Potassium: 3.9 mmol/L (ref 3.5–5.1)
Sodium: 135 mmol/L (ref 135–145)

## 2021-06-25 LAB — PREPARE RBC (CROSSMATCH)

## 2021-06-25 MED ORDER — SODIUM CHLORIDE 0.9% IV SOLUTION: Freq: Once | INTRAVENOUS | Status: AC

## 2021-06-25 MED ORDER — HYDROMORPHONE HCL 2 MG PO TABS
4.0000 mg | ORAL_TABLET | ORAL | Status: DC | PRN
  Administered 2021-06-25 (×2): 4 mg via ORAL
  Filled 2021-06-25 (×2): qty 2

## 2021-06-25 MED ORDER — HYDROXYZINE HCL 50 MG/ML IM SOLN
25.0000 mg | Freq: Once | INTRAMUSCULAR | Status: AC
  Administered 2021-06-25: 25 mg via INTRAMUSCULAR
  Filled 2021-06-25: qty 0.5

## 2021-06-25 MED ORDER — HYDROMORPHONE HCL 1 MG/ML IJ SOLN
1.0000 mg | INTRAMUSCULAR | Status: DC | PRN
  Administered 2021-06-26 – 2021-06-27 (×3): 2 mg via INTRAVENOUS
  Administered 2021-06-27: 1 mg via INTRAVENOUS
  Filled 2021-06-25: qty 1
  Filled 2021-06-25 (×3): qty 2
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
## 2021-06-25 NOTE — Progress Notes (Signed)
Called into the room by the nurse tech around [place]. Patient was lying back in bed kicking the bedside table, screaming, hollering for help. Swinging at nursing staff. Patient reports that he felt that he couldn't breathe. O2 sat's 97% on 2 lpm. HR tachy. Patient taking shallow short breaths. Instructed patient to close his eyes and take slow deep breaths. O2 sat remained between 94-97%. Patient coughed up some clear mucus, mouth suctioned with yaunker. Administered duoneb. Sat with patient and continued deep breathing until treatment finished. Patient no sitting up in bed. He apologized we assured him it was okay and that he was safe. He is still calling out for [place].  ?

## 2021-06-25 NOTE — Progress Notes (Signed)
? ?                                                                                                                                                     ?                                                   ?Daily Progress Note  ? ?Patient Name: Randall Larsen.       Date: 06/25/2021 ?DOB: 08-01-1955  Age: 66 y.o. MRN#: [id number] ?Attending Physician: Debbe Odea, MD ?Primary Care Physician: Hal Morales, DO ?Admit Date: 06/20/2021 ? ?Reason for Consultation/Follow-up: Establishing goals of care, Pain control, and Psychosocial/spiritual support ? ?Subjective: ? Awake alert, tearful, family at bedside.  ? ?Length of Stay: 5 ? ?Current Medications: ?Scheduled Meds:  ?? vitamin C  500 mg Oral BID  ?? celecoxib  200 mg Oral BID  ?? chlorhexidine  15 mL Mouth Rinse BID  ?? Chlorhexidine Gluconate Cloth  6 each Topical Daily  ?? docusate sodium  100 mg Oral BID  ?? enoxaparin (LOVENOX) injection  40 mg Subcutaneous Q24H  ?? gabapentin  300 mg Oral BID  ?? guaiFENesin  600 mg Oral Q12H  ?? hydrALAZINE  50 mg Oral TID  ?? lidocaine  1 patch Transdermal Q24H  ?? mouth rinse  15 mL Mouth Rinse q12n4p  ?? methadone  20 mg Oral Q8H  ?? metoprolol tartrate  25 mg Oral BID  ?? pantoprazole  40 mg Oral BID  ?? polyethylene glycol  17 g Oral BID  ? ? ?Continuous Infusions: ?? cefTRIAXone (ROCEPHIN)  IV 2 g (06/25/21 1126)  ?? lactated ringers Stopped (06/24/21 2324)  ?? metronidazole Stopped (06/25/21 1052)  ?? vancomycin Stopped (06/25/21 1052)  ? ? ?PRN Meds: ?acetaminophen **OR** acetaminophen, HYDROmorphone (DILAUDID) injection **OR** HYDROmorphone, ipratropium-albuterol, lip balm, LORazepam, ondansetron **OR** ondansetron (ZOFRAN) IV ? ?Physical Exam         ?Awake alert ?Tearful ?Regular work of breathing ?Appears chronically ill and with generalized weakness ?Wound pictures noted on chart.  ? ?Vital Signs: BP 117/62   Pulse 75   Temp 98.4 ?F (36.9 ?C) (Oral)   Resp 18   Ht '5\' 10"'$  (1.778 m)   SpO2 98%   BMI  32.61 kg/m?  ?SpO2: SpO2: 98 % ?O2 Device: O2 Device: Nasal Cannula ?O2 Flow Rate: O2 Flow Rate (L/min): 2 L/min ? ?Intake/output summary:  ?Intake/Output Summary (Last 24 hours) at 06/25/2021 1354 ?Last data filed at 06/25/2021 1338 ?Gross per 24 hour  ?Intake 969.18 ml  ?Output 700 ml  ?Net 269.18 ml  ? ?LBM: Last BM Date : 06/24/21 ?Baseline Weight:   ?Most recent weight:   ? ?     ?  Palliative Assessment/Data: ? ? ? ? ? ?Patient Active Problem List  ? Diagnosis Date Noted  ?? Goals of care, counseling/discussion 06/22/2021  ?? Sepsis due to undetermined organism ([place]) 06/20/2021  ?? Unstageable pressure ulcer of sacral region ([place]) 06/20/2021  ?? Normocytic anemia 06/15/2021  ?? Palliative care patient 05/27/2021  ?? Fall 05/20/2021  ?? 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 Acute urinary retention 05/20/2021  ?? Bilateral lower extremity edema 05/18/2021  ?? Edema 05/04/2021  ?? Pain 05/01/2021  ?? Tobacco abuse 05/01/2021  ?? Essential hypertension 05/01/2021  ?? Chest pain 05/01/2021  ?? Prostate cancer metastatic to bone ([place]) 02/14/2021  ?? Bone metastases ([place]) 02/14/2021  ? ? ?Palliative Care Assessment & Plan  ? ?Patient Profile: ?  ? ?Assessment: ? Multi factorial pain: physical pain due to large sacral ulcer, back pain, chest wall/rib pain. Additionally, there is also a psychosocial, existential and spiritual component to his pain and suffering.  ?Prostate cancer metastatic to bone.  ? ?Recommendations/Plan: ?Medication history noted, patient on PO Methadone, PO and IV PRN Dilaudid, ok to use PRN IV Dilaudid for incident/breakthrough pain upon hydrotherapy attempts or on moving,repositioning.  ?Continue current mode of care, general surgery also following, overall goals of care are not comfort-focused at this point, although patient and family are well aware of the serious and incurable nature of the patient's condition. PMT to continue to follow along and to attempt to support the  patient holistically.  ?  ?Code Status: ? ?  ?Code Status Orders  ?(From admission, onward)  ?  ? ? ?  ? ?  Start     Ordered  ? 06/22/21 1612  Do not attempt resuscitation (DNR)  Continuous       ?Question Answer Comment  ?In the event of cardiac or respiratory ARREST Do not call a ?code blue?   ?In the event of cardiac or respiratory ARREST Do not perform Intubation, CPR, defibrillation or ACLS   ?In the event of cardiac or respiratory ARREST Use medication by any route, position, wound care, and other measures to relive pain and suffering. May use oxygen, suction and manual treatment of airway obstruction as needed for comfort.   ?  ? 06/22/21 1611  ? ?  ?  ? ?  ? ?Code Status History   ? ? Date Active Date Inactive Code Status Order ID Comments User Context  ? 06/20/2021 1351 06/22/2021 1611 Full Code [id number]  Reubin Milan, MD ED  ? 05/01/2021 2105 05/31/2021 0319 Full Code [id number]  Toy Baker, MD Inpatient  ? ?  ? ? ?Prognosis: ? Guarded  ? ?Discharge Planning: ?To Be Determined ? ?Care plan was discussed with patient, family at bedside, [facility] MD. ? ?Thank you for allowing the Palliative Medicine Team to assist in the care of this patient. ? ? ?Time In: 1400 Time Out: 1425 Total Time 25 Prolonged Time Billed  no   ? ?   ?Greater than 50%  of this time was spent counseling and coordinating care related to the above assessment and plan. ? ?Loistine Chance, MD ? ?Please contact Palliative Medicine Team phone at [phone] for questions and concerns.  ? ? ? ? ? ?
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
## 2021-06-25 NOTE — TOC Progression Note (Addendum)
Transition of Care (TOC) - Progression Note  ? ? ?Patient Details  ?Name: Darvin Dials. ?MRN: [id number] ?Date of Birth: 07-Jun-1955 ? ?Transition of Care (TOC) CM/SW Contact  ?Ross Ludwig, LCSW ?Phone Number: ?06/25/2021, 6:53 PM ? ?Clinical Narrative:    ? ?TOC updated FL2 and sent it to [facility]). ? ?  ?  ? ?Expected Discharge Plan and Services ? SNF where he is LTC ?  ?  ?  ?  ?                ?  ?  ?  ?  ?  ?  ?  ?  ?  ?  ? ? ?Social Determinants of Health (SDOH) Interventions ?  ? ?Readmission Risk Interventions ?No flowsheet data found. ? ?

## 2021-06-25 NOTE — NC FL2 (Signed)
?[place] MEDICAID FL2 LEVEL OF CARE SCREENING TOOL  ?  ? ?IDENTIFICATION  ?Patient Name: ?Randall Larsen. Birthdate: 01-Oct-1955 Sex: male Admission Date (Current Location): ?06/20/2021  ?[place] and [place] Number: ? [place] ?  Facility and Address:  ?[facility],  [address] [place], [place] ?     Provider Number: ?[id number]  ?Attending Physician Name and Address:  ?Debbe Odea, MD ? Relative Name and Phone Number:  ?[facility] Daughter [phone]    Lanier Ensign Other   [phone]  [place], [place]   [phone]  [place] [facility] Relative   [phone] ?   ?Current Level of Care: ?Hospital Recommended Level of Care: ?[place] Prior Approval Number: ?  ? ?Date Approved/Denied: ?  PASRR Number: ?[id number] A ? ?Discharge Plan: ?SNF ?  ? ?Current Diagnoses: ?Patient Active Problem List  ? Diagnosis Date Noted  ? Goals of care, counseling/discussion 06/22/2021  ? Sepsis due to undetermined organism ([place]) 06/20/2021  ? Unstageable pressure ulcer of sacral region ([place]) 06/20/2021  ? Normocytic anemia 06/15/2021  ? Palliative care patient 05/27/2021  ? Fall 05/20/2021  ? 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 Acute urinary retention 05/20/2021  ? Bilateral lower extremity edema 05/18/2021  ? Edema 05/04/2021  ? Pain 05/01/2021  ? Tobacco abuse 05/01/2021  ? Essential hypertension 05/01/2021  ? Chest pain 05/01/2021  ? Prostate cancer metastatic to bone ([place]) 02/14/2021  ? Bone metastases ([place]) 02/14/2021  ? ? ?Orientation RESPIRATION BLADDER Height & Weight   ?  ?Self, Situation ? O2 (2L) Incontinent Weight:   ?Height:  '5\' 10"'$  (177.8 cm)  ?BEHAVIORAL SYMPTOMS/MOOD NEUROLOGICAL BOWEL NUTRITION STATUS  ?    Incontinent Diet (Cardiac)  ?AMBULATORY STATUS COMMUNICATION OF NEEDS Skin   ?Extensive Assist Verbally PU Stage and Appropriate Care, Hydro Therapy (Unstageable) ?  ?  ?  ?    ?     ?     ? ? ?Personal Care Assistance Level of Assistance  ?Bathing,  Feeding, Dressing Bathing Assistance: Maximum assistance ?Feeding assistance: Maximum assistance ?Dressing Assistance: Maximum assistance ?   ? ?Functional Limitations Info  ?Sight, Hearing, Speech Sight Info: Adequate ?Hearing Info: Adequate ?Speech Info: Adequate  ? ? ?SPECIAL CARE FACTORS FREQUENCY  ?PT (By licensed PT)   ?  ?PT Frequency: Minimum 5x a week ?OT Frequency: Minimum 5x a week ?  ?  ?  ?   ? ? ?Contractures Contractures Info: Not present  ? ? ?Additional Factors Info  ?Code Status, Allergies Code Status Info: DNR ?Allergies Info: No Known Allergies ?  ?  ?  ?   ? ?Current Medications (06/25/2021):  This is the current hospital active medication list ?Current Facility-Administered Medications  ?Medication Dose Route Frequency Provider Last Rate Last Admin  ? acetaminophen (TYLENOL) tablet 650 mg  650 mg Oral Q6H PRN Reubin Milan, MD   650 mg at 06/22/21 2139  ? Or  ? acetaminophen (TYLENOL) suppository 650 mg  650 mg Rectal Q6H PRN Reubin Milan, MD      ? ascorbic acid (VITAMIN C) tablet 500 mg  500 mg Oral BID Reubin Milan, MD   500 mg at 06/25/21 1937  ? cefTRIAXone (ROCEPHIN) 2 g in sodium chloride 0.9 % 100 mL IVPB  2 g Intravenous Q24H Reubin Milan, MD   Stopped at 06/25/21 1156  ? chlorhexidine (PERIDEX) 0.12 % solution 15 mL  15 mL Mouth Rinse BID Reubin Milan, MD   15 mL at  06/25/21 0932  ? Chlorhexidine Gluconate Cloth 2 % PADS 6 each  6 each Topical Daily Reubin Milan, MD   6 each at 06/25/21 0932  ? docusate sodium (COLACE) capsule 100 mg  100 mg Oral BID Reubin Milan, MD   100 mg at 06/25/21 8338  ? enoxaparin (LOVENOX) injection 40 mg  40 mg Subcutaneous Q24H Reubin Milan, MD   40 mg at 06/22/21 2129  ? gabapentin (NEURONTIN) capsule 300 mg  300 mg Oral BID Reubin Milan, MD   300 mg at 06/25/21 2505  ? guaiFENesin (MUCINEX) 12 hr tablet 600 mg  600 mg Oral Q12H Reubin Milan, MD   600 mg at 06/25/21 3976  ? hydrALAZINE  (APRESOLINE) tablet 50 mg  50 mg Oral TID Reubin Milan, MD   50 mg at 06/25/21 1529  ? HYDROmorphone (DILAUDID) injection 1-2 mg  1-2 mg Intravenous Q3H PRN Debbe Odea, MD      ? Or  ? HYDROmorphone (DILAUDID) tablet 4 mg  4 mg Oral Q4H PRN Debbe Odea, MD   4 mg at 06/25/21 1729  ? ipratropium-albuterol (DUONEB) 0.5-2.5 (3) MG/3ML nebulizer solution 3 mL  3 mL Nebulization Q4H PRN Reubin Milan, MD   3 mL at 06/22/21 2012  ? lidocaine (LIDODERM) 5 % 1 patch  1 patch Transdermal Q24H Reubin Milan, MD   1 patch at 06/25/21 [phone]  ? lip balm (CARMEX) ointment   Topical PRN Reubin Milan, MD   75 application. at 06/23/21 0345  ? MEDLINE mouth rinse  15 mL Mouth Rinse q12n4p Reubin Milan, MD   15 mL at 06/25/21 1533  ? methadone (DOLOPHINE) tablet 20 mg  20 mg Oral Q8H Reubin Milan, MD   20 mg at 06/25/21 1529  ? metoprolol tartrate (LOPRESSOR) tablet 25 mg  25 mg Oral BID Reubin Milan, MD   25 mg at 06/25/21 9379  ? metroNIDAZOLE (FLAGYL) IVPB 500 mg  500 mg Intravenous Q12H Reubin Milan, MD   Stopped at 06/25/21 1640  ? ondansetron (ZOFRAN) tablet 4 mg  4 mg Oral Q6H PRN Reubin Milan, MD      ? Or  ? ondansetron [facility]) injection 4 mg  4 mg Intravenous Q6H PRN Reubin Milan, MD      ? pantoprazole (PROTONIX) EC tablet 40 mg  40 mg Oral BID Reubin Milan, MD   40 mg at 06/25/21 0240  ? polyethylene glycol (MIRALAX / GLYCOLAX) packet 17 g  17 g Oral BID Reubin Milan, MD   17 g at 06/24/21 2143  ? vancomycin (VANCOREADY) IVPB 1500 mg/300 mL  1,500 mg Intravenous Q24H Eudelia Bunch, RPH   Stopping previously hung infusion at 06/25/21 1052  ? ? ? ?Discharge Medications: ?Please see discharge summary for a list of discharge medications. ? ?Relevant Imaging Results: ? ?Relevant Lab Results: ? ? ?Additional Information ?SSN [id number] ? ?Ross Ludwig, LCSW ? ? ? ? ?
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
## 2021-06-25 NOTE — Progress Notes (Addendum)
Patient again refusing sacral wound care. Patient was pre medicated with 2 mg of IV dilaudid prior to attempt. In process of laying flat patient instantly became tearful and asked [place] to stop. We attempted a couple more times and patient again asked [place] to stop with each attempt and was tearful. Patient refusing to lie flat or to be turned for assessment. MD present in room and is aware.  ?

## 2021-06-25 NOTE — Progress Notes (Addendum)
?[facility] Progress Note ? ?Patient: Randall Larsen.     ?HUD:[id number]  ?DOA: 06/20/2021   ?PCP: Hal Morales, DO  ? ?  ?  ?Brief hospital course: ? 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 ?He was admitted from 1/23 through 2/21 and treated for cancer associated pain, received radiation as palliation for his pain, was started on methadone and oral Dilaudid and then discharged to a skilled nursing facility.  He also had a Foley placed was meant to be permanent. ?He was supposed to start Zytiga and prednisone today but ended up back in the hospital on 3/14. ?The patient is a poor historian and unable to give me history.  His ex-wife and daughter live in [place] and states that he they came to visit him last week and noticed an extreme foul smell coming from him.  They visited him for 4 days and were told repeatedly that "he is getting better" but continued to notice the foul smell and therefore request that he be sent to the hospital. ?In the ED he was noted to have a temperature of 102.3, heart rate in the 100s, and extensive necrotic area on both of his buttocks, WBC count of 13.3 and was started on IV antibiotics.  General surgery was consulted and and debrided the area.  Wound postdebridement measured 12 x 13 x 6 cm. ?  ? ?Subjective:  ?No new complaints other than ongoing pain ? ?Assessment and Plan: ?  ?* Sepsis due to undetermined organism [facility]) ?- Sepsis likely secondary to large necrotic foul-smelling sacral decubitus ulcer- there is a possibility of a UTI in addition to this ?- Decubitus ulcer occurred while at skilled nursing facility after the last discharge ?- Since he has a chronic Foley and UA is + for bacteria and WBC, we are unable to completely rule out a UTI- culture was not sent ?- Blood cultures negative    ?- option given for debridement in the OR but family refused ?- he will need extensive hydrotherapy and bedside debridement to removed necrotic tissue  ?- I have explained to his family that his WBC continues to rise and his infection is not improving ? ?Prostate cancer metastatic to bone [facility]) ?- Extensively metastatic prostate cancer with uncontrollable pain in back, sacral area and chest, paraplegia, chronic Foley ?- family does not want comfort care ? 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 ?Anemia ?- Hgb dropped to 6.9 today- received consent from daughter and ex-wife to transfuse- give 1 u PRBC ?- this in likely due to his prostate cancer/ chronic disease ?- no acute bleeding from wound or elsewhere ? ?Goals of care, counseling/discussion ?I have had conversations with his family regards his poor prognosis related to extensive necrosis of sacral area superimposed on underlying extensively metastatic cancer and severe pain related to both ?- Albumin is 1.9 and due to immobility and metastatic cancer, the wound will never heal ?- Wound can easily be re-infected with stool and urine ?- Palliative care has had multiple conversations as well ?- at this point they are hesitant to pursue comfort care- we will continue aggressive care ? ?Pain ?Chronic cancer pain in back and rib and acute pain related to sacral ulcer ?- cont oral/  IV Dilaudid and Methadone ? ? ? ?DVT prophylaxis:  enoxaparin (LOVENOX) injection 40 mg Start: 06/20/21 2200 ?SCDs Start: 06/20/21 1351 ? ?  Code Status: DNR  ?Level of Care: Level of care: Telemetry ?Disposition Plan:  ?Status is: Inpatient ?Remains inpatient appropriate because: IV antibiotics, needs hydrotherapy for wound, needs SNF  ? ?Objective: ?  ?Vitals:  ? 06/24/21 2035 06/25/21 0526 06/25/21 1044 06/25/21 1115  ?BP: 133/65 123/63 133/66 (!) 118/54  ?Pulse: 92 83 77 74  ?Resp:  '18 18 20 19  '$ ?Temp: 99.1 ?F (37.3 ?C) 98.3 ?F (36.8 ?C) 98 ?F (36.7 ?C) 97.9 ?F (36.6 ?C)  ?TempSrc: Oral Oral Oral Oral  ?SpO2: 99% 97% 99% 100%  ?Height:      ? ?There were no vitals filed for this visit. ?Exam: ?General exam: Appears comfortable  ?HEENT: PERRLA, oral mucosa moist, no sclera icterus or thrush ?Respiratory system: Clear to auscultation. Respiratory effort normal. ?Cardiovascular system: S1 & S2 heard, regular rate and rhythm ?Gastrointestinal system: Abdomen soft, nondistended. Normal bowel sounds   ?Central nervous system: Alert and oriented to person. Paraplegia  ?Extremities: No cyanosis, clubbing - 2+ edema of legs ?Skin:     ?  ? ? ? ?Psychiatry:  Mood & affect appropriate.   ? ?Imaging and lab data was personally reviewed ? ? ? CBC: ?Recent Labs  ?Lab 06/21/21 ?1546 06/21/21 ?2233 06/22/21 ?0503 06/23/21 ?8850 06/24/21 ?0446 06/25/21 ?0527  ?WBC 12.1* 12.2* 11.4* 15.3* 14.8* 16.3*  ?NEUTROABS 8.1*  --  7.8* 10.3* 12.7* 10.8*  ?HGB 6.7* 6.4* 6.6* 8.0* 7.0* 6.9*  ?HCT 21.9* 20.2* 20.8* 25.9* 21.9* 22.3*  ?MCV 99.5 98.5 98.6 103.2* 98.6 99.6  ?PLT 259 261 253 286 280 305  ? ? ?Basic Metabolic Panel: ?Recent Labs  ?Lab 06/20/21 ?1010 06/21/21 ?1546 06/22/21 ?2774 06/23/21 ?1287 06/24/21 ?8676 06/25/21 ?7209  ?NA 137 134*  --  132*  --  135  ?K 4.9 4.5  --  3.9  --  3.9  ?CL 105 105  --  105  --  104  ?CO2 20* 21*  --  17*  --  19*  ?GLUCOSE 133* 96  --  82  --  126*  ?BUN 36* 39*  --  36*  --  32*  ?CREATININE 1.06 1.27* 1.22 1.12 1.06 0.88  ?CALCIUM 7.7* 7.5*  --  7.8*  --  7.8*  ? ? ?GFR: ?CrCl cannot be calculated (Unknown ideal weight.). ? ?Scheduled Meds: ? vitamin C  500 mg Oral BID  ? celecoxib  200 mg Oral BID  ? chlorhexidine  15 mL Mouth Rinse BID  ? Chlorhexidine Gluconate Cloth  6 each Topical Daily  ? docusate sodium  100 mg Oral BID  ? enoxaparin (LOVENOX) injection  40 mg Subcutaneous Q24H  ? gabapentin  300 mg Oral BID  ? guaiFENesin  600 mg Oral Q12H  ? hydrALAZINE  50 mg Oral  TID  ? lidocaine  1 patch Transdermal Q24H  ? mouth rinse  15 mL Mouth Rinse q12n4p  ? methadone  20 mg Oral Q8H  ? metoprolol tartrate  25 mg Oral BID  ? pantoprazole  40 mg Oral BID  ? polyethylene glycol  17 g Oral BID  ? ?Continuous Infusions: ? cefTRIAXone (ROCEPHIN)  IV 2 g (06/25/21 1126)  ? lactated ringers Stopped (06/24/21 2324)  ? metronidazole Stopped (06/25/21 1052)  ? vancomycin Stopped (06/25/21 1052)  ? ? ? LOS: 5 days  ? ?Author: ?Debbe Odea  ?06/25/2021  1:09 PM ?   ?

## 2021-06-26 DIAGNOSIS — R52 Pain, unspecified: Secondary | ICD-10-CM | POA: Diagnosis not present

## 2021-06-26 DIAGNOSIS — Z7189 Other specified counseling: Secondary | ICD-10-CM | POA: Diagnosis not present

## 2021-06-26 DIAGNOSIS — R0603 Acute respiratory distress: Secondary | ICD-10-CM | POA: Diagnosis not present

## 2021-06-26 DIAGNOSIS — A419 Sepsis, unspecified organism: Secondary | ICD-10-CM | POA: Diagnosis not present

## 2021-06-26 DIAGNOSIS — G8220 Paraplegia, unspecified: Secondary | ICD-10-CM | POA: Diagnosis not present

## 2021-06-26 LAB — CBC WITH DIFFERENTIAL/PLATELET
Abs Immature Granulocytes: 1.6 10*3/uL — ABNORMAL HIGH (ref 0.00–0.07)
Band Neutrophils: 5 %
Basophils Absolute: 0 10*3/uL (ref 0.0–0.1)
Basophils Relative: 0 %
Eosinophils Absolute: 0 10*3/uL (ref 0.0–0.5)
Eosinophils Relative: 0 %
HCT: 27.2 % — ABNORMAL LOW (ref 39.0–52.0)
Hemoglobin: 8.5 g/dL — ABNORMAL LOW (ref 13.0–17.0)
Lymphocytes Relative: 6 %
Lymphs Abs: 1.1 10*3/uL (ref 0.7–4.0)
MCH: 30.9 pg (ref 26.0–34.0)
MCHC: 31.3 g/dL (ref 30.0–36.0)
MCV: 98.9 fL (ref 80.0–100.0)
Metamyelocytes Relative: 2 %
Monocytes Absolute: 0.9 10*3/uL (ref 0.1–1.0)
Monocytes Relative: 5 %
Myelocytes: 7 %
Neutro Abs: 14.2 10*3/uL — ABNORMAL HIGH (ref 1.7–7.7)
Neutrophils Relative %: 75 %
Platelets: 314 10*3/uL (ref 150–400)
RBC: 2.75 MIL/uL — ABNORMAL LOW (ref 4.22–5.81)
RDW: 21.3 % — ABNORMAL HIGH (ref 11.5–15.5)
WBC: 17.8 10*3/uL — ABNORMAL HIGH (ref 4.0–10.5)
nRBC: 4.6 % — ABNORMAL HIGH (ref 0.0–0.2)

## 2021-06-26 LAB — TYPE AND SCREEN
ABO/RH(D): O POS
Antibody Screen: NEGATIVE
Unit division: 0

## 2021-06-26 LAB — BASIC METABOLIC PANEL
Anion gap: 8 (ref 5–15)
BUN: 21 mg/dL (ref 8–23)
CO2: 21 mmol/L — ABNORMAL LOW (ref 22–32)
Calcium: 7.8 mg/dL — ABNORMAL LOW (ref 8.9–10.3)
Chloride: 108 mmol/L (ref 98–111)
Creatinine, Ser: 0.67 mg/dL (ref 0.61–1.24)
GFR, Estimated: >60 mL/min (ref >60)
Glucose, Bld: 105 mg/dL — ABNORMAL HIGH (ref 70–99)
Potassium: 4.2 mmol/L (ref 3.5–5.1)
Sodium: 137 mmol/L (ref 135–145)

## 2021-06-26 LAB — BPAM RBC
Blood Product Expiration Date: 202304102359
ISSUE DATE / TIME: 202303191051
Unit Type and Rh: 5100

## 2021-06-26 MED ORDER — LORAZEPAM 0.5 MG PO TABS
0.5000 mg | ORAL_TABLET | Freq: Two times a day (BID) | ORAL | Status: DC | PRN
  Administered 2021-06-28 – 2021-07-02 (×4): 0.5 mg via ORAL
  Filled 2021-06-26 (×5): qty 1

## 2021-06-26 MED ORDER — LORAZEPAM 0.5 MG PO TABS: 0.5000 mg | ORAL_TABLET | Freq: Two times a day (BID) | ORAL | Status: DC
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
## 2021-06-26 NOTE — Progress Notes (Signed)
Pharmacy Antibiotic Note ? ?Randall Larsen. is a 66 y.o. male with  on 06/20/2021 with metastatic prostate cancer on lupron and abiraterone PTA and sacral wound presented to the ED on 06/20/21 for wound check and was found to be febrile. Abdominal/pelvis CT on 06/20/21 showed large decubitus ulcer at and inferior to the level of the coccyx with no evidence of osteomyelitis.  He was started on vancomycin, ceftriaxone and flagyl for broad coverage. ? ?Note that 3/19 Vancomycin dose not given (documented that patient/family refused) ? ?Today, 06/26/2021: ?- day #7 abx ?- Afebrile, wbc up 17.8 ?- scr down 0.67 ? ?Plan: ?- continue with vancomycin 1500 mg IV q24h for est AUC 485, ceftriaxone 2gm q24h and flagyl 500 mg q12h ?- if vancomycin doses consistently given and plan is to continue therapy, will check levels ? ?____________________________________ ? ?Height: '5\' 10"'$  (177.8 cm) ?IBW/kg (Calculated) : 73 ? ?Temp (24hrs), Avg:98.3 ?F (36.8 ?C), Min:97.9 ?F (36.6 ?C), Max:98.7 ?F (37.1 ?C) ? ?Recent Labs  ?Lab 06/20/21 ?1010 06/21/21 ?1546 06/22/21 ?0503 06/22/21 ?5027 06/23/21 ?7412 06/24/21 ?0446 06/25/21 ?8786 06/25/21 ?7672 06/26/21 ?0507  ?WBC 13.3*   < > 11.4*  --  15.3* 14.8* 16.3*  --  17.8*  ?CREATININE 1.06   < >  --  1.22 1.12 1.06  --  0.88 0.67  ?LATICACIDVEN 1.9  --   --   --   --   --   --   --   --   ? < > = values in this interval not displayed.  ? ?  ?CrCl cannot be calculated (Unknown ideal weight.).   ? ?No Known Allergies ? ?3/14 CTX> ?3/14 flagyl> ?3/14 vanc>. ? ?3/14 BCx: NGF ? ?Thank you for allowing pharmacy to be a part of this patient?s care. ? ?Peggyann Juba, PharmD, BCPS ?Pharmacy: [phone] ?06/26/2021 8:39 AM ? ?

## 2021-06-26 NOTE — Progress Notes (Signed)
Pt calm but still refusing care. Will not allow me to assess IV or allow me to give IV antibiotics after explaining what they are for. He keeps arms up hands behind his head and I am UTA. Informed Charge and On call, [place] advises to leave alone and try again in the a.m. will hold meds. ?
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
## 2021-06-26 NOTE — Progress Notes (Signed)
? ? ?   ?Subjective: ?CC: ?Seen with hydrotherapy. Patient reports he has pain in his upper back with turning.  ?Notes reviewed from the weekend ? ?Objective: ?Vital signs in last 24 hours: ?Temp:  [97.9 ?F (36.6 ?C)-98.9 ?F (37.2 ?C)] 98.9 ?F (37.2 ?C) (03/20 1022) ?Pulse Rate:  [74-119] 103 (03/20 1022) ?Resp:  [16-22] 18 (03/20 1022) ?BP: (117-153)/(54-77) 153/77 (03/20 1022) ?SpO2:  [95 %-100 %] 96 % (03/20 1022) ?Last BM Date : 06/24/21 ? ?Intake/Output from previous day: ?03/19 0701 - 03/20 0700 ?In: 1334.2 [P.O.:390; I.V.:313.2; Blood:316; IV Piggyback:315] ?Out: 1700 [Urine:1700] ?Intake/Output this shift: ?Total I/O ?In: 120 [P.O.:120] ?Out: -  ? ?PE: ?Sacral wound as noted below. The wound measures 16cm x 12cm x 6cm. There is 6cm of undermining right superior laterally. There is additional 12cm of tracking superior laterally on the left as well (see 2nd picture). There is some granulation tissue at the inferior aspect of the wound with fibropurulent material more central. There is still large amount of eschar on the superior portion of the wound. Bloody purulent drainage noted from the superior left lateral portion of the wound where it undermines/tracks the most.  ? ? ?12cm tracking superior laterally on the left w/ bloody purulent drainage as noted in picture  ? ?Lab Results:  ?Recent Labs  ?  06/25/21 ?7829 06/26/21 ?0507  ?WBC 16.3* 17.8*  ?HGB 6.9* 8.5*  ?HCT 22.3* 27.2*  ?PLT 305 314  ? ?BMET ?Recent Labs  ?  06/25/21 ?0907 06/26/21 ?0507  ?NA 135 137  ?K 3.9 4.2  ?CL 104 108  ?CO2 19* 21*  ?GLUCOSE 126* 105*  ?BUN 32* 21  ?CREATININE 0.88 0.67  ?CALCIUM 7.8* 7.8*  ? ?PT/INR ?No results for input(s): LABPROT, INR in the last 72 hours. ?CMP  ?   ?Component Value Date/Time  ? NA 137 06/26/2021 0507  ? K 4.2 06/26/2021 0507  ? CL 108 06/26/2021 0507  ? CO2 21 (L) 06/26/2021 0507  ? GLUCOSE 105 (H) 06/26/2021 0507  ? BUN 21 06/26/2021 0507  ? CREATININE 0.67 06/26/2021 0507  ? CREATININE 1.05 06/14/2021  1119  ? CALCIUM 7.8 (L) 06/26/2021 0507  ? PROT 5.7 (L) 06/21/2021 1546  ? ALBUMIN 1.9 (L) 06/21/2021 1546  ? AST 18 06/21/2021 1546  ? AST 14 (L) 06/14/2021 1119  ? ALT 16 06/21/2021 1546  ? ALT 14 06/14/2021 1119  ? ALKPHOS 211 (H) 06/21/2021 1546  ? BILITOT 0.7 06/21/2021 1546  ? BILITOT 0.5 06/14/2021 1119  ? GFRNONAA >60 06/26/2021 0507  ? GFRNONAA >60 06/14/2021 1119  ? GFRAA >60 05/03/2018 1410  ? ?Lipase  ?   ?Component Value Date/Time  ? LIPASE 20 04/02/2021 1742  ? ? ?Studies/Results: ?No results found. ? ?Anti-infectives: ?Anti-infectives (From admission, onward)  ? ? Start     Dose/Rate Route Frequency Ordered Stop  ? 06/21/21 2200  vancomycin (VANCOREADY) IVPB 1500 mg/300 mL       ? 1,500 mg ?150 mL/hr over 120 Minutes Intravenous Every 24 hours 06/20/21 2102    ? 06/21/21 1200  cefTRIAXone (ROCEPHIN) 2 g in sodium chloride 0.9 % 100 mL IVPB       ? 2 g ?200 mL/hr over 30 Minutes Intravenous Every 24 hours 06/20/21 1623    ? 06/20/21 1430  vancomycin (VANCOREADY) IVPB 2000 mg/400 mL       ? 2,000 mg ?200 mL/hr over 120 Minutes Intravenous  Once 06/20/21 1417 06/20/21 2247  ? 06/20/21 1400  cefTRIAXone (ROCEPHIN) 2 g in sodium chloride 0.9 % 100 mL IVPB  Status:  Discontinued       ? 2 g ?200 mL/hr over 30 Minutes Intravenous Every 24 hours 06/20/21 1345 06/20/21 1345  ? 06/20/21 1400  metroNIDAZOLE (FLAGYL) IVPB 500 mg       ? 500 mg ?100 mL/hr over 60 Minutes Intravenous Every 12 hours 06/20/21 1345    ? 06/20/21 1315  cefTRIAXone (ROCEPHIN) 2 g in sodium chloride 0.9 % 100 mL IVPB  Status:  Discontinued       ? 2 g ?200 mL/hr over 30 Minutes Intravenous Every 24 hours 06/20/21 1302 06/20/21 1623  ? ?  ? ? ? ?Assessment/Plan ?Unstageable sacral decubitus pressure ulcer in a patient with metastatic prostate cancer ?- S/p bedside debridement 3/15 ?- CT w/o evidence of osteo ?- Pressure relief with air overlay mattress and frequent turning to displace pressure ?- Patient found to not have capacity per  psych. Please see separate progress notes for detailed discussions with patients family. Briefly our team has offered debridement of sacral wound under anesthesia to create an open wound to expose all surfaces in hopes of controlling infecting and getting a "clean" wound. We discussed that patient would still require turning/wound care (dressing changes, hydrotherapy etc) to continue to promote a clean wound after surgery. He has been having pain with current wound care, especially with turns and I suspect at least some of his pain is from his mets to his bone as his main complaint today is back pain with turning. We discussed his wound would likely never heal given his non-ambulatory status and nutritional status. Family still undergoing [place] discussions with palliative to decide if they would like surgery, comfort care etc. In the interm we will continue to follow along and plan to see again on Wed with hydrotherapy. Please contact us with any changes or with any questions or concerns. We are available to discuss further with family if they need any additional information or have any questions. IV abx per primary.  ?  ?FEN: Reg ?VTE: SCDs, Lovenox ?ID: Rocephin/Vanc/Flagyl ? ? LOS: 6 days  ? ? ?Jillyn Ledger , PA-C ?[facility] ?06/26/2021, 11:02 AM ?Please see Amion for pager number during day hours 7:00am-4:30pm ? ?

## 2021-06-26 NOTE — Progress Notes (Addendum)
? 06/26/21 1100 Hydrotherapy treatment note  ?Subjective Assessment  ?Subjective please don't hurt me, hold my  hand. I justwant to sleep  ?Patient and Family Stated Goals family wants hydrotherapy performed  ?Date of Onset  ?(present on admission)  ?Prior Treatments dressing changes  ?Evaluation and Treatment  ?Evaluation and Treatment Procedures Explained to Patient/Family Yes  ?Evaluation and Treatment Procedures Other (comment) ?(family agrees, pt is intermittently confused)  ?Pressure Injury 06/21/21 Sacrum Medial Unstageable - Full thickness tissue loss in which the base of the injury is covered by slough (yellow, tan, gray, green or brown) and/or eschar (tan, brown or black) in the wound bed.  ?Date First Assessed/Time First Assessed: 06/21/21 0800   Location: Sacrum  Location Orientation: Medial  Staging: Unstageable - Full thickness tissue loss in which the base of the injury is covered by slough (yellow, tan, gray, green or brown) and/or ...  ?Dressing Type Gauze (Comment);ABD;Tape dressing;Other (Comment);Normal saline moist dressing ?(Dakins  not in room, asked nurasing to supply but did not  receive it.)  ?Dressing New drainage;Other (Comment);Clean, Dry, Intact  ?Dressing Change Frequency Twice a day  ?State of Healing Non-healing  ?Site / Wound Assessment Bleeding;Black;Brown;Painful;Red;Other (Comment) ?(large amounts of necrotic tissue noted purulent drainge.)  ?% Wound base Red or Granulating 5%  ?% Wound base Yellow/Fibrinous Exudate 70%  ?% Wound base Black/Eschar 25%  ?Peri-wound Assessment Bleeding;Excoriated;Pink;Other (Comment)  ?Wound Length (cm) 16 cm  ?Wound Width (cm) 12 cm  ?Wound Depth (cm) 6 cm  ?Wound Surface Area (cm^2) 192 cm^2  ?Wound Volume (cm^3) 7829 cm^3  ?Tunneling (cm) 12  ?Margins Unattached edges (unapproximated)  ?Drainage Amount Copious  ?Drainage Description Odor - foul ?(tan)  ?Treatment Debridement (Selective);Hydrotherapy (Pulse lavage);Packing (Saline  gauze) ?(Dakins not in room)  ?Hydrotherapy  ?Pulsed Lavage with Suction (psi) 12 psi  ?Pulsed Lavage with Suction - Normal Saline Used 1000 mL  ?Pulsed Lavage Tip Tip with splash shield  ?Pulsed lavage therapy - wound location sacral wound  ?Selective Debridement  ?Selective Debridement - Location sacrum  ?Selective Debridement - Tools Used Forceps;Scissors;Scalpel  ?Selective Debridement - Tissue Removed softening eschar, black and gray necrotic tissue  ?Wound Therapy - Assess/Plan/Recommendations  ?Wound Therapy - Clinical Statement pt with larege unstageable sacral/gluteal pressure injury. Patient received IV pain mediication prior. patient indicates some feeling in the wound area, appears very painful when rolling and positioning. patient tolerated well and appeared to be resting near end of session. Micheal Majzis PA in to debride and inspect the wound. Hydro to continue with ?dakins and packing. family not present.  ?Wound Therapy - Functional Problem List paraplegi d/t metastatic prostate cancer  ?Factors Delaying/Impairing Wound Healing Altered sensation;Incontinence;Infection - systemic/local;Immobility;Multiple medical problems  ?Hydrotherapy Plan Debridement;Dressing change;Patient/family education;Pulsatile lavage with suction.    ?Wound Therapy - Frequency 6X / week  ?Wound Therapy - Follow Up Recommendations f/u pulsed lavage with suction;f/u selective debridement;dressing changes by RN  ?Wound Therapy Goals - Improve the function of patient's integumentary system by progressing the wound(s) through the phases of wound healing by:  ?Decrease Necrotic Tissue to 80  ?Decrease Necrotic Tissue - Progress Progressing toward goal  ?Increase Granulation Tissue to 20  ?Increase Granulation Tissue - Progress Mot progressing  ?Patient/Family will be able to  verbalize dressing changes and purpose of hydrotherapy  ?Patient/Family Instruction Goal - Progress Progressing toward goal  ?Goals/treatment  plan/discharge plan were made with and agreed upon by patient/family No, Patient unable to participate in goals/treatment/discharge plan and family unavailable ?Goals  will be updated until maximal potential achieved or discharge criteria met.  Discharge criteria: when goals achieved, discharge from hospital, MD decision/surgical intervention, no progress towards goals, refusal/missing three consecutive treatments without notification or medical reason. ?   ?Time For Goal Achievement 2 weeks  ?Wound Therapy - Potential for Goals Poor.   ?Tresa Endo PT ?Acute Rehabilitation Services ?Pager [phone] ?Office [phone] ?Deb >20 , plus 1  ,2 dress tip and shield ? ?

## 2021-06-26 NOTE — Progress Notes (Addendum)
?[facility] Progress Note ? ?Patient: Randall Larsen.     ?TWS:[id number]  ?DOA: 06/20/2021   ?PCP: Hal Morales, DO  ? ?  ?  ?HPI: ? 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 ?He was admitted from 1/23 through 2/21 and treated for cancer associated pain, received radiation as palliation for his pain, was started on methadone and oral Dilaudid and then discharged to a skilled nursing facility.  He also had a Foley placed was meant to be permanent. ?He was supposed to start Zytiga and prednisone but ended up back in the hospital on 3/14 prior to starting it. ?The patient is a poor historian and unable to give me history.  His ex-wife and daughter live in [place] and his daughter states that the they came to visit him at the SNF and noticed an extreme foul smell coming from him.  They visited him for 4 days and were told repeatedly that "he is getting better" but continued to notice the foul smell and therefore request that he be sent to the hospital. ?In the ED he was noted to have a temperature of 102.3, heart rate in the 100s, and extensive necrotic area on both of his buttocks, WBC count of 13.3 and was started on IV antibiotics.  General surgery was consulted and and debrided the area.  Wound postdebridement measured 12 x 13 x 6 cm. ?  ? ?Subjective:  ?He pulled out his IV and Foley cath overnight. Continued to have hallucinations.  ? ?Assessment and Plan: ?  ?* Sepsis due to undetermined organism [facility]) ?- Sepsis likely secondary to large necrotic foul-smelling sacral decubitus ulcer- there is a possibility of a UTI in addition to this ?- Decubitus ulcer occurred while at skilled nursing facility after the last discharge ?- Since he has a chronic Foley and UA is + for bacteria and WBC, we are unable to completely rule out a  UTI- culture was not sent ?- Blood cultures negative   ?- option given for debridement in the OR but family refused ?- he will need extensive hydrotherapy and bedside debridement to removed necrotic tissue  ?- I have explained to his family that his WBC continues to rise and his infection is not improving ? ?Prostate cancer metastatic to bone [facility]) ?- Extensively metastatic prostate cancer with uncontrollable pain in back, sacral area and chest, paraplegia, chronic Foley ?- severe pain related to mets  ? 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 ?Anemia ?- Hgb dropped to 6.9  on 3/19- Hgb now 8.5 ?- received consent from daughter and ex-wife to transfuse- give 1 u PRBC ?- this in likely due to his prostate cancer/ chronic disease ?- no acute bleeding from wound or elsewhere ? ?Goals of care, counseling/discussion ?I have had conversations with his family regards his poor prognosis related to extensive necrosis of sacral area superimposed on underlying extensively metastatic cancer and severe pain related to both ?- Albumin is 1.9 and due to immobility and metastatic cancer, the wound will never heal ?- Wound can easily be re-infected with stool and urine ?- Palliative care has had multiple conversations as well ?- his family has been allowing him to make medical decisions although I have explained the he does  not have a full understanding of his condition- as mentioned, he has been confused, hallucinating pulling out IVs and pulled out his Foley ?- psych eval placed in chart yesterday noting that he does not have the ability to make medical decisions at this time ?- although his daughter, ex wife and son were requested to do POA paperwork, his daughter tells me today that they did not choose a POA ?- I have consulted the Ethics team today-  ? ?Pain ?Chronic cancer pain in back and rib and acute pain related to  sacral ulcer ?- cont oral/ IV Dilaudid and Methadone ? ?Discussed plan with daughter, surgery, palliative care, ethics attending, TOC & RN. ? ? ?DVT prophylaxis:  enoxaparin (LOVENOX) injection 40 mg Start: 06/20/21 2200 ?SCDs Start: 06/20/21 1351 ? ?  Code Status: DNR  ?Level of Care: Level of care: Telemetry ?Disposition Plan:  ?Status is: Inpatient ?Remains inpatient appropriate because: IV antibiotics, needs hydrotherapy for wound, needs GOC addressed- also need SNF vs hospice home ? ?Objective: ?  ?Vitals:  ? 06/25/21 2059 06/26/21 0617 06/26/21 0657 06/26/21 1022  ?BP: (!) 152/70 (!) 143/77 (!) 147/75 (!) 153/77  ?Pulse: (!) 102 (!) 113 (!) 119 (!) 103  ?Resp: 18 (!) '22 18 18  '$ ?Temp: 98.2 ?F (36.8 ?C) 98.7 ?F (37.1 ?C) 98.2 ?F (36.8 ?C) 98.9 ?F (37.2 ?C)  ?TempSrc: Oral Oral Oral Oral  ?SpO2: 95% 96% 95% 96%  ?Height:      ? ?There were no vitals filed for this visit. ?Exam: ?General exam: Appears comfortable -   asleep ?HEENT: PERRLA, oral mucosa moist, no sclera icterus or thrush ?Respiratory system: Clear to auscultation. Respiratory effort normal. ?Cardiovascular system: S1 & S2 heard, regular rate and rhythm ?Gastrointestinal system: Abdomen soft, non-tender, nondistended. Normal bowel sounds   ?Extremities: + b/l pedal edema ? ?Skin:     ?  ? ? ?  ? ?Imaging and lab data was personally reviewed ? ? ? CBC: ?Recent Labs  ?Lab 06/22/21 ?0503 06/23/21 ?1610 06/24/21 ?0446 06/25/21 ?9604 06/26/21 ?0507  ?WBC 11.4* 15.3* 14.8* 16.3* 17.8*  ?NEUTROABS 7.8* 10.3* 12.7* 10.8* 14.2*  ?HGB 6.6* 8.0* 7.0* 6.9* 8.5*  ?HCT 20.8* 25.9* 21.9* 22.3* 27.2*  ?MCV 98.6 103.2* 98.6 99.6 98.9  ?PLT 253 286 280 305 314  ? ? ?Basic Metabolic Panel: ?Recent Labs  ?Lab 06/20/21 ?1010 06/21/21 ?1546 06/22/21 ?5409 06/23/21 ?8119 06/24/21 ?0446 06/25/21 ?1478 06/26/21 ?0507  ?NA 137 134*  --  132*  --  135 137  ?K 4.9 4.5  --  3.9  --  3.9 4.2  ?CL 105 105  --  105  --  104 108  ?CO2 20* 21*  --  17*  --  19* 21*  ?GLUCOSE 133* 96   --  82  --  126* 105*  ?BUN 36* 39*  --  36*  --  32* 21  ?CREATININE 1.06 1.27* 1.22 1.12 1.06 0.88 0.67  ?CALCIUM 7.7* 7.5*  --  7.8*  --  7.8* 7.8*  ? ? ?GFR: ?CrCl cannot be calculated (Unknown ideal weight.). ? ?Scheduled Meds: ? vitamin C  500 mg Oral BID  ? chlorhexidine  15 mL Mouth Rinse BID  ? Chlorhexidine Gluconate Cloth  6 each Topical Daily  ? docusate sodium  100 mg Oral BID  ? enoxaparin (LOVENOX) injection  40 mg Subcutaneous Q24H  ? gabapentin  300 mg Oral BID  ? guaiFENesin  600 mg Oral Q12H  ? hydrALAZINE  50  mg Oral TID  ? lidocaine  1 patch Transdermal Q24H  ? mouth rinse  15 mL Mouth Rinse q12n4p  ? methadone  20 mg Oral Q8H  ? metoprolol tartrate  25 mg Oral BID  ? pantoprazole  40 mg Oral BID  ? polyethylene glycol  17 g Oral BID  ? ?Continuous Infusions: ? cefTRIAXone (ROCEPHIN)  IV Stopped (06/25/21 1156)  ? metronidazole Stopped (06/25/21 1640)  ? vancomycin Stopped (06/25/21 1052)  ? ? ? LOS: 6 days  ? ?Author: ?Debbe Odea  ?06/26/2021 12:25 PM ?   ?

## 2021-06-26 NOTE — Progress Notes (Signed)
?   06/26/21 0617  ?Assess: MEWS Score  ?Temp 98.7 ?F (37.1 ?C)  ?BP (!) 143/77  ?Pulse Rate (!) 113  ?Resp (!) 22  ?Level of Consciousness Alert  ?SpO2 96 %  ?O2 Device Nasal Cannula  ?O2 Flow Rate (L/min) 2 L/min  ?Assess: MEWS Score  ?MEWS Temp 0  ?MEWS Systolic 0  ?MEWS Pulse 2  ?MEWS RR 1  ?MEWS LOC 0  ?MEWS Score 3  ?MEWS Score Color Yellow  ?Treat  ?Pain Scale 0-10  ?Pain Score 9  ?Take Vital Signs  ?Increase Vital Sign Frequency  Yellow: Q 2hr X 2 then Q 4hr X 2, if remains yellow, continue Q 4hrs  ?Escalate  ?MEWS: Escalate Yellow: discuss with charge nurse/RN and consider discussing with provider and RRT  ?Notify: Charge Nurse/RN  ?Name of Charge Nurse/RN Notified Rupinder,RN  ?Date Charge Nurse/RN Notified 06/26/21  ?Time Charge Nurse/RN Notified [phone]  ?Notify: Provider  ?Provider Name/Title [facility]  ?Date Provider Notified 09/26/21  ?Notification Type Page  ?Notification Reason Critical result  ?Provider response No new orders  ?Date of Provider Response 06/26/21  ?Time of Provider Response [phone]  ?Notify: Rapid Response  ?Name of Rapid Response RN Notified not notified  ?Assess: SIRS CRITERIA  ?SIRS Temperature  0  ?SIRS Pulse 1  ?SIRS Respirations  1  ?SIRS WBC 0  ?SIRS Score Sum  2  ? ?VS were taken after his episode, pulling out and then re insertion of foley cath and 2x attempt of new IV,  ?

## 2021-06-26 NOTE — Care Management Important Message (Signed)
Important Message ? ?Patient Details IM Letter placed in Patients room. ?Name: Randall Larsen. ?MRN: [id number] ?Date of Birth: 01/06/1956 ? ? ?Medicare Important Message Given:  Yes ? ? ? ? ?Kerin Salen ?06/26/2021, 10:07 AM ?

## 2021-06-26 NOTE — Progress Notes (Signed)
?   06/26/21 1434  ?Assess: MEWS Score  ?Temp 99.1 ?F (37.3 ?C)  ?BP (!) 156/84  ?Pulse Rate (!) 125  ?Resp 18  ?SpO2 94 %  ?O2 Device Nasal Cannula  ?O2 Flow Rate (L/min) 1 L/min  ?Assess: MEWS Score  ?MEWS Temp 0  ?MEWS Systolic 0  ?MEWS Pulse 2  ?MEWS RR 0  ?MEWS LOC 0  ?MEWS Score 2  ?MEWS Score Color Yellow  ?Assess: if the MEWS score is Yellow or Red  ?Were vital signs taken at a resting state? Yes  ?Focused Assessment No change from prior assessment  ?Does the patient meet 2 or more of the SIRS criteria? No  ?Does the patient have a confirmed or suspected source of infection? Yes  ?Provider and Rapid Response Notified? No  ?MEWS guidelines implemented *See Row Information* No, previously yellow, continue vital signs every 4 hours  ?Treat  ?MEWS Interventions Escalated (See documentation below)  ?Pain Scale 0-10  ?Pain Score Asleep  ?Take Vital Signs  ?Increase Vital Sign Frequency  Yellow: Q 2hr X 2 then Q 4hr X 2, if remains yellow, continue Q 4hrs  ?Escalate  ?MEWS: Escalate Yellow: discuss with charge nurse/RN and consider discussing with provider and RRT  ?Notify: Charge Nurse/RN  ?Name of Charge Nurse/RN Notified Jarrett Soho, RN  ?Date Charge Nurse/RN Notified 06/26/21  ?Time Charge Nurse/RN Notified 1440  ?Assess: SIRS CRITERIA  ?SIRS Temperature  0  ?SIRS Pulse 1  ?SIRS Respirations  0  ?SIRS WBC 0  ?SIRS Score Sum  1  ? ? ?

## 2021-06-27 DIAGNOSIS — Z7189 Other specified counseling: Secondary | ICD-10-CM | POA: Diagnosis not present

## 2021-06-27 DIAGNOSIS — R52 Pain, unspecified: Secondary | ICD-10-CM | POA: Diagnosis not present

## 2021-06-27 DIAGNOSIS — G8220 Paraplegia, unspecified: Secondary | ICD-10-CM | POA: Diagnosis not present

## 2021-06-27 DIAGNOSIS — A419 Sepsis, unspecified organism: Secondary | ICD-10-CM | POA: Diagnosis not present

## 2021-06-27 LAB — CBC WITH DIFFERENTIAL/PLATELET
Abs Immature Granulocytes: 3.11 10*3/uL — ABNORMAL HIGH (ref 0.00–0.07)
Basophils Absolute: 0.1 10*3/uL (ref 0.0–0.1)
Basophils Relative: 0 %
Eosinophils Absolute: 0 10*3/uL (ref 0.0–0.5)
Eosinophils Relative: 0 %
HCT: 26.7 % — ABNORMAL LOW (ref 39.0–52.0)
Hemoglobin: 8.3 g/dL — ABNORMAL LOW (ref 13.0–17.0)
Immature Granulocytes: 18 %
Lymphocytes Relative: 8 %
Lymphs Abs: 1.4 10*3/uL (ref 0.7–4.0)
MCH: 31.3 pg (ref 26.0–34.0)
MCHC: 31.1 g/dL (ref 30.0–36.0)
MCV: 100.8 fL — ABNORMAL HIGH (ref 80.0–100.0)
Monocytes Absolute: 1.2 10*3/uL — ABNORMAL HIGH (ref 0.1–1.0)
Monocytes Relative: 7 %
Neutro Abs: 11.5 10*3/uL — ABNORMAL HIGH (ref 1.7–7.7)
Neutrophils Relative %: 67 %
Platelets: 322 10*3/uL (ref 150–400)
RBC: 2.65 MIL/uL — ABNORMAL LOW (ref 4.22–5.81)
RDW: 21.8 % — ABNORMAL HIGH (ref 11.5–15.5)
WBC: 17.2 10*3/uL — ABNORMAL HIGH (ref 4.0–10.5)
nRBC: 6.8 % — ABNORMAL HIGH (ref 0.0–0.2)

## 2021-06-27 LAB — CREATININE, SERUM
Creatinine, Ser: 0.7 mg/dL (ref 0.61–1.24)
GFR, Estimated: >60 mL/min (ref >60)

## 2021-06-27 MED ORDER — HYDROMORPHONE HCL 1 MG/ML IJ SOLN
1.0000 mg | INTRAMUSCULAR | Status: DC | PRN
  Administered 2021-06-27 – 2021-07-01 (×6): 2 mg via INTRAVENOUS
  Filled 2021-06-27 (×6): qty 2

## 2021-06-27 MED ORDER — DAKINS (1/4 STRENGTH) 0.125 % EX SOLN
Freq: Two times a day (BID) | CUTANEOUS | Status: AC
  Filled 2021-06-27: qty 473

## 2021-06-27 MED ORDER — HYDROMORPHONE HCL 2 MG PO TABS
4.0000 mg | ORAL_TABLET | ORAL | Status: DC | PRN
  Administered 2021-06-29 – 2021-07-02 (×5): 4 mg via ORAL
  Filled 2021-06-27 (×6): qty 2

## 2021-06-27 NOTE — Progress Notes (Signed)
?   06/27/21 0724  ?Assess: MEWS Score  ?Temp 99.6 ?F (37.6 ?C)  ?BP (!) 154/79  ?Pulse Rate (!) 111  ?ECG Heart Rate (!) 104  ?Resp (!) 21  ?Level of Consciousness Alert  ?SpO2 95 %  ?O2 Device Nasal Cannula  ?Patient Activity (if Appropriate) In bed  ?O2 Flow Rate (L/min) 1 L/min  ?Assess: MEWS Score  ?MEWS Temp 0  ?MEWS Systolic 0  ?MEWS Pulse 1  ?MEWS RR 1  ?MEWS LOC 0  ?MEWS Score 2  ?MEWS Score Color Yellow  ?Assess: if the MEWS score is Yellow or Red  ?Were vital signs taken at a resting state? Yes  ?Focused Assessment No change from prior assessment  ?Does the patient meet 2 or more of the SIRS criteria? Yes  ?Does the patient have a confirmed or suspected source of infection? Yes  ?Provider and Rapid Response Notified? No  ?MEWS guidelines implemented *See Row Information* No, previously yellow, continue vital signs every 4 hours  ?Treat  ?MEWS Interventions Other (Comment) ?(repositioned)  ?Pain Scale 0-10  ?Pain Score Asleep  ?Faces Pain Scale 2  ?Pain Type Acute pain;Chronic pain  ?Pain Location Buttocks  ?Pain Orientation Mid  ?Pain Descriptors / Indicators Discomfort;Grimacing  ?Pain Frequency Constant  ?Pain Onset With Activity  ?Patients Stated Pain Goal 0  ?Pain Intervention(s) Repositioned;Back rub  ?Multiple Pain Sites No  ?Breathing 0  ?Negative Vocalization 0  ?Facial Expression 0  ?Body Language 0  ?Consolability 0  ?PAINAD Score 0  ?Complains of Other (Comment) ?(patient is currently sleeping)  ?Interventions Reposition  ?Neuro symptoms relieved by Relaxation techniques (Comment)  ?Patients response to intervention Relief  ?Take Vital Signs  ?Increase Vital Sign Frequency  Yellow: Q 2hr X 2 then Q 4hr X 2, if remains yellow, continue Q 4hrs  ?Escalate  ?MEWS: Escalate Yellow: discuss with charge nurse/RN and consider discussing with provider and RRT  ?Notify: Charge Nurse/RN  ?Name of Charge Nurse/RN Notified Wayna Chalet. RN  ?Date Charge Nurse/RN Notified 06/27/21  ?Time Charge Nurse/RN Notified  [phone]  ?Notify: Provider  ?Provider Name/Title Dr. Wynelle Cleveland  ?Date Provider Notified 06/27/21  ?Time Provider Notified [phone]  ?Notification Type Rounds  ?Notification Reason Other (Comment) ?(update MD)  ?Provider response No new orders  ?Date of Provider Response 06/27/21  ?Time of Provider Response [phone]  ?Notify: Rapid Response  ?Name of Rapid Response RN Notified  ?(No, pt is asleep)  ?Document  ?Patient Outcome Other (Comment) ?(Repositioned, made comfortable, pt sleeping)  ?Progress note created (see row info) Yes  ?Assess: SIRS CRITERIA  ?SIRS Temperature  0  ?SIRS Pulse 1  ?SIRS Respirations  1  ?SIRS WBC 1  ?SIRS Score Sum  3  ?Patient repositioned for comfort, patient currently sleeping comfortably.  ?

## 2021-06-27 NOTE — Progress Notes (Signed)
Poor po intake for patient today, attempt to feed patient x2, patient spit food back out and refused to eat for lunch. Patient much more lethargic today.  ?

## 2021-06-27 NOTE — Plan of Care (Signed)
No acute events overnight. ?Problem: Coping: ?Goal: Level of anxiety will decrease ?Outcome: Progressing ?  ?Problem: Elimination: ?Goal: Will not experience complications related to bowel motility ?Outcome: Progressing ?Goal: Will not experience complications related to urinary retention ?Outcome: Progressing ?  ?Problem: Pain Managment: ?Goal: General experience of comfort will improve ?Outcome: Progressing ?  ?Problem: Safety: ?Goal: Ability to remain free from injury will improve ?Outcome: Progressing ?  ?Problem: Safety: ?Goal: Non-violent Restraint(s) ?Outcome: Progressing ?  ?

## 2021-06-27 NOTE — Progress Notes (Signed)
? ? ? ?  Attempted to contact daughter and son as requested. Dan Europe (daughter) expressed she was in the middle of an important meeting at work however does wish for a call back after 5pm. Advised I will try to give family a call back after 5pm however I am off service at that time. She acknowledged expressing neither of them would be available until then. Will attempt to call later if possible, if not will follow-up tomorrow morning.  ? ?Alda Lea, AGPCNP-BC ?Palliative Medicine Team  ? ?

## 2021-06-27 NOTE — Progress Notes (Signed)
Randall Larsen was very cold at the time of chaplain visit.  Chaplain brought him a blanket and asked about other needs.  Randall Larsen stated that he did not have any other needs at this time.  Chaplain inquired with staff about family, but it seems that family has had to return home for now. ? ?[employer], Bcc ?Pager, [phone] ? ?

## 2021-06-27 NOTE — Progress Notes (Addendum)
? 06/27/21 1230 ?Hydrotherapy treatment note  ?Subjective Assessment  ?Subjective Can i have some water?, I can eat?  Turn your head, I am going to cough. Can I have dilaudid  ?Patient and Family Stated Goals family wants hydrotherapy performed  ?Date of Onset  ?(present on admission)  ?Prior Treatments dressing changes  ?Evaluation and Treatment  ?Evaluation and Treatment Procedures Explained to Patient/Family Yes  ?Evaluation and Treatment Procedures Other (comment) ?(family agrees, pt is intermittently confused)  ?Pressure Injury 06/21/21 Sacrum Medial Unstageable - Full thickness tissue loss in which the base of the injury is covered by slough (yellow, tan, gray, green or brown) and/or eschar (tan, brown or black) in the wound bed.  ?Date First Assessed/Time First Assessed: 06/21/21 0800   Location: Sacrum  Location Orientation: Medial  Staging: Unstageable - Full thickness tissue loss in which the base of the injury is covered by slough (yellow, tan, gray, green or brown) and/or ...  ?Dressing Type Gauze (Comment);ABD;Other (Comment);Normal saline moist dressing;Silver dressings;Silicone dressing ?(Dakins  not in room, silicon dressing placed below lower border of wound over excoriated area.  Marland Kitchen)  ?Dressing Other (Comment);Changed  ?Dressing Change Frequency Twice a day  ?State of Healing Early/partial granulation  ?Site / Wound Assessment Bleeding;Black;Brown;Painful;Red;Other (Comment) ?(large amounts of necrotic tissue noted purulent drainge.)  ?% Wound base Red or Granulating 15%  ?% Wound base Yellow/Fibrinous Exudate 80%  ?% Wound base Black/Eschar 5%  ?Peri-wound Assessment Bleeding;Excoriated;Pink;Other (Comment)  ?Wound Length (cm) 16 cm  ?Wound Width (cm) 12 cm  ?Wound Depth (cm) 6 cm  ?Wound Surface Area (cm^2) 192 cm^2  ?Wound Volume (cm^3) 3354 cm^3  ?Tunneling (cm) 12 ?(at 9:00  ?Margins Unattached edges (unapproximated)  ?Drainage Amount Copious  ?Drainage Description Purulent ?(tan,)   ?Hydrotherapy  ?Pulsed Lavage with Suction (psi) 12 psi  ?Pulsed Lavage with Suction - Normal Saline Used 1000 mL  ?Pulsed Lavage Tip Tip with splash shield  ?Pulsed lavage therapy - wound location sacral wound  ?Selective Debridement  ?Selective Debridement - Location sacrum  ?Selective Debridement - Tools Used Forceps;Scissors  ?Selective Debridement - Tissue Removed softening eschar, black and gray necrotic tissue  ?Wound Therapy - Assess/Plan/Recommendations  ?Wound Therapy - Clinical Statement pt with larege unstageable sacral/gluteal pressure injury. Patient received IV pain mediication prior. patient indicates some feeling in the wound area, more so on the right  borders of wound and reports painful.. Also indicates pain when rolling and repositioning. patient asking for dilaudid, reported to patient that RN had premedicated him before PT arrived. Debrided dark hard eschar on top of wound. Noted bleeding in areas. tannish green  drainge runs from lower right  wound when pressing around the wound. Continue HYdrotherapy.Marland Kitchen ?Assisted patient with mouth care per his request.noted tongue with yellow coating.  ?Wound Therapy - Functional Problem List paraplegia d/t metastatic prostate cancer  ?Factors Delaying/Impairing Wound Healing Altered sensation;Incontinence;Infection - systemic/local;Immobility;Multiple medical problems  ?Hydrotherapy Plan Debridement;Dressing change;Patient/family education;Pulsatile lavage with suction  ?Wound Therapy - Frequency 6X / week  ?Wound Therapy - Follow Up Recommendations dressing changes by RN  ?Wound Therapy Goals - Improve the function of patient's integumentary system by progressing the wound(s) through the phases of wound healing by:  ?Decrease Necrotic Tissue to 80  ?Decrease Necrotic Tissue - Progress Progressing toward goal  ?Increase Granulation Tissue to 20  ?Increase Granulation Tissue - Progress Progressing toward goal  ?Patient/Family will be able to  verbalize  dressing changes and purpose of hydrotherapy  ?Patient/Family Instruction Goal -  Progress Progressing toward goal  ?Goals/treatment plan/discharge plan were made with and agreed upon by patient/family No, Patient unable to participate in goals/treatment/discharge plan and family unavailable  ?Time For Goal Achievement 2 weeks  ?Wound Therapy - Potential for Goals Poor  ? ?Tresa Endo PT ?Acute Rehabilitation Services ?Pager [phone] ?Office [phone] ?Deb > 20 + 1, dr 1, tip and shield ?[phone] ?

## 2021-06-27 NOTE — Progress Notes (Signed)
?[facility] Progress Note ? ?Patient: Randall Larsen.     ?XKP:[id number]  ?DOA: 06/20/2021   ?PCP: Hal Morales, DO  ? ?  ?  ?HPI: ? 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 ?He was admitted from 1/23 through 2/21 and treated for cancer associated pain, received radiation as palliation for his pain, was started on methadone and oral Dilaudid and then discharged to a skilled nursing facility.  He also had a Foley placed was meant to be permanent. ?He was supposed to start Zytiga and prednisone but ended up back in the hospital on 3/14 prior to starting it. ?The patient is a poor historian and unable to give me history.  His ex-wife and daughter live in [place] and his daughter states that the they came to visit him at the SNF and noticed an extreme foul smell coming from him.  They visited him for 4 days and were told repeatedly that "he is getting better" but continued to notice the foul smell and therefore request that he be sent to the hospital. ?In the ED he was noted to have a temperature of 102.3, heart rate in the 100s, and extensive necrotic area on both of his buttocks, WBC count of 13.3 and was started on IV antibiotics.  General surgery was consulted and and debrided the area.  Wound postdebridement measured 12 x 13 x 6 cm. ?  ? ?Subjective:  ?Sleepy. Asks me for water. No other complaints.  ? ?Assessment and Plan: ?  ?* Sepsis due to undetermined organism [facility] ?- Sepsis likely secondary to large necrotic foul-smelling sacral decubitus ulcer- there is a possibility of a UTI in addition to this ?- Decubitus ulcer occurred while at skilled nursing facility after the last discharge ?- Since he has a chronic Foley and UA is + for bacteria and WBC, we are unable to completely rule out a UTI- culture was not sent ?- Blood  cultures negative   ?- option given for debridement in the OR but family refused ?- he will need extensive hydrotherapy and bedside debridement to removed necrotic tissue  ? - his WBC counts continue to rise despite antibiotics ? ?Prostate cancer metastatic to bone [facility]) ?Pain related to cancer ?- Extensively metastatic prostate cancer with uncontrollable pain in back, sacral area and chest, paraplegia, chronic Foley ?- severe pain related to cancer and mets - on Methadone and oral Dilaudid from prior admit- currently interchanging the oral Dilaudid with IV as well ? 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 ?Anemia ?- Hgb dropped to 6.9  on 3/19- Hgb  ` 8 ?- this in likely due to his prostate cancer/ chronic disease ?- no acute bleeding from wound or elsewhere ? ?Goals of care, counseling/discussion ?I have had numerous conversations with his family regards his poor prognosis related to extensive necrosis of sacral area superimposed on underlying extensively metastatic cancer and severe pain related to both ?- Albumin is 1.9 and due to immobility and metastatic cancer, the wound will never heal ?- Wound can easily be re-infected with stool and urine ?- Palliative care has had multiple long conversations as well ?- his family has been allowing him to make medical decisions although I have explained the he does not  have a full understanding of his condition- as mentioned, he has been confused, hallucinating pulling out IVs and pulled out his Foley ?- psych eval placed in chart noting that he does not have the ability to make medical decisions at this time ?- although his daughter, ex wife and son were requested to do POA paperwork, his daughter tells me  that they did not choose a POA ?- I  consulted the Ethics team on 3/20, discussed plan with palliative as well and told them I feel he is a candidate for  residential hospice- ?Per palliative care note from yesterday> If no decisions in next 24-48hrs medical team can consider futility policy allowing ernest time for family to discuss and make complex decisions.  ?  ? ?Discussed plan with daughter, surgery, palliative care, ethics attending, TOC & RN. ? ? ?DVT prophylaxis:  enoxaparin (LOVENOX) injection 40 mg Start: 06/20/21 2200 ?SCDs Start: 06/20/21 1351 ? ?  Code Status: DNR  ?Level of Care: Level of care: Telemetry ?Disposition Plan:  ?Status is: Inpatient ?Remains inpatient appropriate because: IV antibiotics, needs hydrotherapy for wound, needs GOC addressed- also need SNF vs hospice home ? ?Objective: ?  ?Vitals:  ? 06/26/21 1858 06/26/21 2300 06/27/21 0249 06/27/21 0724  ?BP: (!) 155/85 (!) 146/75 (!) 141/77 (!) 154/79  ?Pulse: (!) 110 91 99 (!) 111  ?Resp: '19 20 16 '$ (!) 21  ?Temp: 98 ?F (36.7 ?C) 98.4 ?F (36.9 ?C) 98.5 ?F (36.9 ?C) 99.6 ?F (37.6 ?C)  ?TempSrc: Oral Oral Oral Oral  ?SpO2: 97% 98% 93% 95%  ?Height:      ? ?There were no vitals filed for this visit. ?Exam: ? General exam: Appears comfortable- somnolent  ?HEENT: PERRLA, oral mucosa moist, no sclera icterus or thrush ?Central nervous system: paraplegic ?Extremities: No cyanosis, clubbing - has 2 + edema of LE ?Skin: not turned to look at wound today ?Unable to properly examine him as he is crying in pain and will not let me uncover him ?    ?  ? ?Imaging and lab data was personally reviewed ? ? ? CBC: ?Recent Labs  ?Lab 06/23/21 ?8119 06/24/21 ?0446 06/25/21 ?1478 06/26/21 ?0507 06/27/21 ?2956  ?WBC 15.3* 14.8* 16.3* 17.8* 17.2*  ?NEUTROABS 10.3* 12.7* 10.8* 14.2* 11.5*  ?HGB 8.0* 7.0* 6.9* 8.5* 8.3*  ?HCT 25.9* 21.9* 22.3* 27.2* 26.7*  ?MCV 103.2* 98.6 99.6 98.9 100.8*  ?PLT 286 280 305 314 322  ? ? ?Basic Metabolic Panel: ?Recent Labs  ?Lab 06/21/21 ?1546 06/22/21 ?2130 06/23/21 ?8657 06/24/21 ?0446 06/25/21 ?8469 06/26/21 ?0507 06/27/21 ?6295  ?NA 134*  --  132*  --  135 137  --   ?K 4.5  --   3.9  --  3.9 4.2  --   ?CL 105  --  105  --  104 108  --   ?CO2 21*  --  17*  --  19* 21*  --   ?GLUCOSE 96  --  82  --  126* 105*  --   ?BUN 39*  --  36*  --  32* 21  --   ?CREATININE 1.27*   < > 1.12 1.06 0.88 0.67 0.70  ?CALCIUM 7.5*  --  7.8*  --  7.8* 7.8*  --   ? < > = values in this interval not displayed.  ? ? ?GFR: ?CrCl cannot be calculated (Unknown ideal weight.). ? ?Scheduled Meds: ? vitamin C  500 mg Oral BID  ? chlorhexidine  15 mL Mouth Rinse  BID  ? Chlorhexidine Gluconate Cloth  6 each Topical Daily  ? docusate sodium  100 mg Oral BID  ? enoxaparin (LOVENOX) injection  40 mg Subcutaneous Q24H  ? gabapentin  300 mg Oral BID  ? guaiFENesin  600 mg Oral Q12H  ? hydrALAZINE  50 mg Oral TID  ? lidocaine  1 patch Transdermal Q24H  ? mouth rinse  15 mL Mouth Rinse q12n4p  ? methadone  20 mg Oral Q8H  ? metoprolol tartrate  25 mg Oral BID  ? pantoprazole  40 mg Oral BID  ? polyethylene glycol  17 g Oral BID  ? ?Continuous Infusions: ? cefTRIAXone (ROCEPHIN)  IV 2 g (06/26/21 1247)  ? metronidazole 500 mg (06/27/21 0109)  ? vancomycin 1,500 mg (06/26/21 2300)  ? ? ? LOS: 7 days  ? ?Author: ?Debbe Odea  ?06/27/2021 11:38 AM ?   ?

## 2021-06-28 DIAGNOSIS — E872 Acidosis, unspecified: Secondary | ICD-10-CM

## 2021-06-28 DIAGNOSIS — A419 Sepsis, unspecified organism: Secondary | ICD-10-CM | POA: Diagnosis not present

## 2021-06-28 LAB — CBC WITH DIFFERENTIAL/PLATELET
Abs Immature Granulocytes: 1.8 10*3/uL — ABNORMAL HIGH (ref 0.00–0.07)
Basophils Absolute: 0 10*3/uL (ref 0.0–0.1)
Basophils Relative: 0 %
Eosinophils Absolute: 0 10*3/uL (ref 0.0–0.5)
Eosinophils Relative: 0 %
HCT: 25.4 % — ABNORMAL LOW (ref 39.0–52.0)
Hemoglobin: 7.9 g/dL — ABNORMAL LOW (ref 13.0–17.0)
Lymphocytes Relative: 7 %
Lymphs Abs: 1.4 10*3/uL (ref 0.7–4.0)
MCH: 31.2 pg (ref 26.0–34.0)
MCHC: 31.1 g/dL (ref 30.0–36.0)
MCV: 100.4 fL — ABNORMAL HIGH (ref 80.0–100.0)
Metamyelocytes Relative: 2 %
Monocytes Absolute: 0.8 10*3/uL (ref 0.1–1.0)
Monocytes Relative: 4 %
Myelocytes: 7 %
Neutro Abs: 16 10*3/uL — ABNORMAL HIGH (ref 1.7–7.7)
Neutrophils Relative %: 80 %
Platelets: 312 10*3/uL (ref 150–400)
RBC: 2.53 MIL/uL — ABNORMAL LOW (ref 4.22–5.81)
RDW: 21.8 % — ABNORMAL HIGH (ref 11.5–15.5)
WBC: 20 10*3/uL — ABNORMAL HIGH (ref 4.0–10.5)
nRBC: 5.4 % — ABNORMAL HIGH (ref 0.0–0.2)

## 2021-06-28 NOTE — Progress Notes (Signed)
Patient has only had about 25 mls output from foley, NP Olena Heckle was notified. ?

## 2021-06-28 NOTE — Progress Notes (Signed)
Chaplain checked in on [place] today.  He was tearful, but alert and oriented.  Chaplain wished him a Ramadan Mubarak and inquired about particular prayers that are said during the days of Ramadan.  He was appreciative of the inquiry.  He didn't elaborate on any prayers, but was grateful to be checked on.  He did ask for a pepsi.  Chaplain confirmed with nurse that he was able to have this. Cross was grateful for it, but didn't want to drink it at this time. ? ?[employer], Bcc ?Pager, [phone] ?

## 2021-06-28 NOTE — Progress Notes (Signed)
Pt asked this NT to call his daughter named "Tatiyana." Nt dialed number using pt's phone, service is not working. Pt states "maam please help me, the are reying to kill me." NT asked pt who, pt states "the insurance people." RN notified ?

## 2021-06-28 NOTE — Progress Notes (Signed)
? ? ?   ?Subjective: ?CC: ?Seen with hydro.  ? ?Objective: ?Vital signs in last 24 hours: ?Temp:  [98.7 ?F (37.1 ?C)-100.5 ?F (38.1 ?C)] 99.6 ?F (37.6 ?C) (03/22 2130) ?Pulse Rate:  [93-113] 93 (03/22 0653) ?Resp:  [16-20] 16 (03/22 8657) ?BP: (137-162)/(72-85) 137/72 (03/22 8469) ?SpO2:  [91 %-96 %] 96 % (03/22 0653) ?Last BM Date : 06/25/21 ? ?Intake/Output from previous day: ?03/21 0701 - 03/22 0700 ?In: 1260 [P.O.:660; IV Piggyback:600] ?Out: 350 [Urine:350] ?Intake/Output this shift: ?No intake/output data recorded. ? ?PE: ?Sacral wound as noted below. The wound measures 16cm x 12cm x 6cm. There is undermining/tracking of ~6cm right superior laterally, ~6cm left superior laterally and ~4-5cm right inferior. There is some granulation tissue at the inferior and left superior aspect of the wound with mixed fibropurulent and necrotic tissue more central and superior. There is still some eschar on wound edges. Today there is some thin malodorous drainage without obvious purulence at the right inferior aspect of the wound ? ? ? ? ?Lab Results:  ?Recent Labs  ?  06/27/21 ?0454 06/28/21 ?0637  ?WBC 17.2* 20.0*  ?HGB 8.3* 7.9*  ?HCT 26.7* 25.4*  ?PLT 322 312  ? ?BMET ?Recent Labs  ?  06/26/21 ?0507 06/27/21 ?6295  ?NA 137  --   ?K 4.2  --   ?CL 108  --   ?CO2 21*  --   ?GLUCOSE 105*  --   ?BUN 21  --   ?CREATININE 0.67 0.70  ?CALCIUM 7.8*  --   ? ?PT/INR ?No results for input(s): LABPROT, INR in the last 72 hours. ?CMP  ?   ?Component Value Date/Time  ? NA 137 06/26/2021 0507  ? K 4.2 06/26/2021 0507  ? CL 108 06/26/2021 0507  ? CO2 21 (L) 06/26/2021 0507  ? GLUCOSE 105 (H) 06/26/2021 0507  ? BUN 21 06/26/2021 0507  ? CREATININE 0.70 06/27/2021 0454  ? CREATININE 1.05 06/14/2021 1119  ? CALCIUM 7.8 (L) 06/26/2021 0507  ? PROT 5.7 (L) 06/21/2021 1546  ? ALBUMIN 1.9 (L) 06/21/2021 1546  ? AST 18 06/21/2021 1546  ? AST 14 (L) 06/14/2021 1119  ? ALT 16 06/21/2021 1546  ? ALT 14 06/14/2021 1119  ? ALKPHOS 211 (H) 06/21/2021  1546  ? BILITOT 0.7 06/21/2021 1546  ? BILITOT 0.5 06/14/2021 1119  ? GFRNONAA >60 06/27/2021 0454  ? GFRNONAA >60 06/14/2021 1119  ? GFRAA >60 05/03/2018 1410  ? ?Lipase  ?   ?Component Value Date/Time  ? LIPASE 20 04/02/2021 1742  ? ? ?Studies/Results: ?No results found. ? ?Anti-infectives: ?Anti-infectives (From admission, onward)  ? ? Start     Dose/Rate Route Frequency Ordered Stop  ? 06/21/21 2200  vancomycin (VANCOREADY) IVPB 1500 mg/300 mL       ? 1,500 mg ?150 mL/hr over 120 Minutes Intravenous Every 24 hours 06/20/21 2102    ? 06/21/21 1200  cefTRIAXone (ROCEPHIN) 2 g in sodium chloride 0.9 % 100 mL IVPB       ? 2 g ?200 mL/hr over 30 Minutes Intravenous Every 24 hours 06/20/21 1623    ? 06/20/21 1430  vancomycin (VANCOREADY) IVPB 2000 mg/400 mL       ? 2,000 mg ?200 mL/hr over 120 Minutes Intravenous  Once 06/20/21 1417 06/20/21 2247  ? 06/20/21 1400  cefTRIAXone (ROCEPHIN) 2 g in sodium chloride 0.9 % 100 mL IVPB  Status:  Discontinued       ? 2 g ?200 mL/hr over 30 Minutes  Intravenous Every 24 hours 06/20/21 1345 06/20/21 1345  ? 06/20/21 1400  metroNIDAZOLE (FLAGYL) IVPB 500 mg       ? 500 mg ?100 mL/hr over 60 Minutes Intravenous Every 12 hours 06/20/21 1345    ? 06/20/21 1315  cefTRIAXone (ROCEPHIN) 2 g in sodium chloride 0.9 % 100 mL IVPB  Status:  Discontinued       ? 2 g ?200 mL/hr over 30 Minutes Intravenous Every 24 hours 06/20/21 1302 06/20/21 1623  ? ?  ? ? ? ?Assessment/Plan ?Unstageable sacral decubitus pressure ulcer in a patient with metastatic prostate cancer ?- S/p bedside debridement 3/15 ?- CT w/o evidence of osteo ?- Pressure relief with air overlay mattress and frequent turning to displace pressure ?- Patient found to not have capacity per psych. Please see separate progress notes for detailed discussions with patients family. Briefly our team has offered debridement of sacral wound under anesthesia to create an open wound to expose all surfaces in hopes of controlling infecting and  getting a "clean" wound. We discussed that patient would still require turning/wound care (dressing changes, hydrotherapy etc) to continue to promote a clean wound after surgery. He has been having pain with current wound care, especially with turns and I suspect at least some of his pain is from his mets to his bone as he complains of back pain with turning. We discussed his wound would likely never heal given his non-ambulatory status and nutritional status. His wbc continues to rise despite IV abx. Family still undergoing [place] discussions with palliative to decide if they would like surgery, comfort care etc. We will continue to follow along and plan to see again on Friday with hydrotherapy. Please contact us with any changes or with any questions or concerns. We are available to discuss further with family if they need any additional information or have any questions. IV abx per primary. Discussed with TR[facility].  ?  ?FEN: Reg ?VTE: SCDs, Lovenox ?ID: Rocephin/Vanc/Flagyl ? ? LOS: 8 days  ? ? ?Jillyn Ledger , PA-C ?[facility] ?06/28/2021, 10:04 AM ?Please see Amion for pager number during day hours 7:00am-4:30pm ? ?

## 2021-06-28 NOTE — Progress Notes (Signed)
? ?Palliative Medicine Inpatient Follow Up Note ? ? ?Chart reviewed. Randall Larsen is resting comfortably. Initially opens eyes during exam and falls back to sleep. He later becomes more awake. Able to identify me by name. Is asking for something to drink. A few sips of water given and oral care provided. He continuously apologizes for his past behaviors stating "I didn't mean to be difficult. I didn't mean to pull out my IV and foley!" Reassured patient he did not need to apologize and he is extremely sick. He becomes tearful expressing his understanding. He ask have I spoken with his family. Acknowledged and he is aware I plan to call his family later today. I offered to call at the bedside however he declined. He is tearful asking me to "just tell them I am sorry!" Emotional support provided. He is holding my hands asking me is he suffering. I explained that our goal is to manage his pain aggressively and not let him suffer or be in pain if possible. He expresses appreciation.  ? ?Randall Larsen states today is the first day of Ramadan and a brother from his Muslim community is being arranged to come in and pray with him. I asked how we could best support him during this time. He becomes tearful expressing he is forgiven. Randall Larsen emotionally shares during Ramadan if someone passes they will go to paradise and consider blessed with no concerns for hell. He will not go to hell for his sins emotionally sharing he has not been the best father or person in his life. He ask me is he dying. I empathetically explained although I cannot say when he will pass or how much longer he will leave, medically he is not doing well and will face end-of-life due to his poor health. He ask is it his cancer or his "butt". I explained it is a combination of it all and that his body is not strong enough to have a meaningful recovery despite all efforts. He closes his eyes expressing he understands. Space and silence allowed. His alarm goes off  playing Islamic music. I offered to provide him with privacy. He opens his eyes and seems to be hallucinating stating he was glad they showed back up. When I inquired who he was referring to he turns his head to the window and states them asking them to not be rude and introduce themselves to me (calling me by name). I advised I would allow him privacy to pray. He expressed appreciation and asked would I return back to see him tomorrow.  ? ?I was able to speak at length with family (son, daughter, and wife) during conference call. Detailed updates provided and I also shared my experiences with him on today. I again expressed concerns for Randall Larsen with emphasis on focusing on his comfort for what time he has left with understanding his time is limited. Family is emotional. We discussed Ramadan and how we as medical team could best support. They are aware the Chaplain has been involved and offering support. Family is appreciative. Wayne Both (ex-wife) expressed when they call and try to speak with him he has either seemed confused or gets worked up in his emotions constantly [place]. They do not wish for him to suffer.  ? ?Recommendations again provided for comfort focused care and hospice facility consideration. They have been online and reviewing website for [facility] and [facility] as instructed. Appropriate questions answered. Again provided education on hospice's goals and philosophy of care. Tatianna expressed  appreciation of discussions and that they are much more comfortable in making decisions. Family confirms they are trying to arrange to return back to the [facility] to see patient. Daughter reports she would like to go and personally view [facility] as they are with some hesitancy of facilities given recent experience at rehab. I assured family it would not be the same experience and the care would be empathetic to the needs of Randall Larsen and family.Marland Kitchen they verbalized understanding. They seem to be more  mutually in agreement in comfort focused care and consideration for hospice facility. They would like to continue with current therapies however if patient is not interested, in a state of health, or refuses further wound therapies outside of dressing changes they are ok with this being deferred. No surgical interventions. Daughter request not to place hospice home referral or full comfort orders until we follow-up tomorrow as she is hopeful she will be able to get into town by tomorrow night or Friday. If not no later than the weekend. If she is not able to make it by the weekend they would provide permission to pursue referral on Friday at [place] their request.  ? ? ?Discussed the importance of continued conversation with family and their  medical providers regarding overall plan of care and treatment options, ensuring decisions are within the context of the patients values and GOCs. ? ? ?Questions addressed and support provided.   ? ?Objective Assessment: ?Vital Signs ?Vitals:  ? 06/28/21 1134 06/28/21 1501  ?BP: 140/71 130/74  ?Pulse: 99 99  ?Resp: 20 18  ?Temp: (!) 97.3 ?F (36.3 ?C) 98.3 ?F (36.8 ?C)  ?SpO2: 93% 94%  ? ? ?Intake/Output Summary (Last 24 hours) at 06/28/2021 1531 ?Last data filed at 06/28/2021 1457 ?Gross per 24 hour  ?Intake 1330 ml  ?Output 1550 ml  ?Net -220 ml  ? ? ? ?Gen: appears comfortable, chronically ill-appearing ?WF:[id number]  ?PULM: diminished  ?ABD: soft/nontender/nondistended/normal bowel sounds ?EXT: Bilateral lower extremity edema ?Neuro: Easily awaken, alert to self and abe to call me by name. Some hallucinations noted  ? ?SUMMARY OF RECOMMENDATIONS   ?Continue with current plan of care per medical team. No escalation in care. No surgical interventions. Family ok if wound therapy is deferred to allow comfort for patient.  ?Extensive discussion with family today. Much more reasonable and realistic. Appropriate questions asked regarding comfort and hospice facility.  They are trying to arrange flights or transportation to return to [facility] and visit patient while proceeding with comfort focused care and hospice home referral. They would prefer [facility] in [place] once final decision. See above for detailed discussions.  ?Will plan to follow-up with family tomorrow for support and ongoing discussions. Anticipate transitioning to comfort and hospice home once approved and bed becomes available. Family are aware patient is at risk of sudden death or decompensation. Does not wish for him to suffer.  ?DNR/DNI confirmed  ?PMT will continue to support and follow. Please secure chat for urgent needs.  ? ?Symptom Management:  ?Neoplasm related pain ?Dilaudid IV as needed.  Will increase to 1-2 mg. ?Celebrex 200 mg twice daily ?Gabapentin 300 mg twice daily ?Dilaudid 4 mg by mouth every 3 hours as needed for breakthrough pain.  Patient has not received oral dose since yesterday.  Encourage frequent evaluation of pain and administer medications as needed. ?Methadone 20 mg every 8 hours ?Constipation ?MiraLAX twice daily ?Oral care ?Carmex to lips ?Mouth care twice daily and as needed ?Anxiety ?Ativan  0.5 mg as needed ?Nausea ?Zofran 4 mg every 6 hours as needed ? ?Discussed with Dr. Wynelle Cleveland and family. ? ?Time Total: 65 min.  ? ?Visit consisted of counseling and education dealing with the complex and emotionally intense issues of symptom management and palliative care in the setting of serious and potentially life-threatening illness.Greater than 50%  of this time was spent counseling and coordinating care related to the above assessment and plan. ? ?Alda Lea, AGPCNP-BC  ?[place] ? ? ? ?Palliative Medicine Team providers are available by phone from 7am to 7pm daily and can be reached through the team cell phone. Should this patient require assistance outside of these hours, please call the patient's attending physician.  ?

## 2021-06-28 NOTE — Progress Notes (Signed)
?PROGRESS NOTE ?Randall Larsen.  UXL:[id number] DOB: [birth year] DOA: 06/20/2021 ?PCP: Hal Morales, DO  ? 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  He also had a Foley placed was meant to be permanent. He was supposed to start Zytiga and prednisone but ended up back in the hospital on 06/20/21.  Poor historian/ex-wife and daughter live in [place] and states that he they came to visit him last week and noticed an extreme foul smell coming from him, on visit 4 days prior they were told repeatedly that "he is getting better" but continued to notice the foul smell and therefore request that he be sent to the hospital. ? ?In the ED he was noted to have a temperature of 102.3, heart rate in the 100s, and extensive necrotic area on both of his buttocks, WBC count of 13.3 and was started on IV antibiotics.  General surgery was consulted and and debrided the area.  Wound postdebridement measured 12 x 13 x 6 cm. ?Rapid response was called  as he was "short of breath, anxious, febrile". CXR showed some pulm congestion but patient was not short of breath. ?Patient is being treated for sepsis with large necrotic foul-smelling sacral decubitus ulcer possible UTI in the setting of prostate cancer with mets to bone, cancer pain paraplegia with chronic Foley in place, anemia. ?Palliative care has been consulted for goals of care discussion due to complex nature of his medical illness.  ?  ?Subjective: ?Seen and examined this morning.  His wound is being cleaned and dressing being changed, patient requesting for diet  Pepsi.  Denies any pain. ?Overnight no fever-also had temperature spike regimen 5 4 PM yesterday ?vitals stable.  Labs shows worsening leukocytosis ? ?Assessment and Plan: ?Principal Problem: ?  Sepsis due to undetermined organism [facility]) ?Active Problems: ?  Prostate cancer metastatic to bone [facility]) ?  Pain ?  Essential hypertension ?  Flaccid diplegia of lower extremities (HCC) ?  Normocytic anemia ?  Unstageable pressure ulcer of sacral region [facility]) ?  Goals of care, counseling/discussion ?  Metabolic acidosis ? ?Sepsis with large necrotic foul-smelling sacral decubitus ulcer ?Possible UTI: ?Chronic Foley in place with large decubitus ulcer.  CT without evidence of osteomyelitis.Surgery on board s/p bedside debridement 3/15.Blood cultures negative so far.  Surgery has given option for debridement in the OR but family refused currently and hydrotherapy surgery following peripherally in case family changed their mind.  Platelet has been consulted.WBC count despite being on antibiotics-Vanco/Flagyl/Rocephin.  Family/palliative care undergoing goals of care discussion to decide if they would like surgery or comfort care, ethics also involved.  Given patient's functional debility, deconditioning, poor nutritional status nonambulatory status prostate cancer with mets is small and likely will never heal. ? ?Prostate cancer with metastasis to bone extensive cancer with uncontrolled pain in the back sacral area chest paraplegic with chronic Foley in place family does not want escalation of care but has not decided on comfort care either.  Continue with pain management-for severe cancer related pain with methadone oral and IV narcotics,palliative care following ? ?Anemia of chronic disease needing 1 unit PRBC transfusion for 6.9 hemoglobin.  Monitor  and transfuse as needed ? ?Pedal edema ?Paraplegia ?Chronic Foley ?Metabolic acidosis monitor bicarb level. ?Consult dietitian to augment nutrition ? ?Goals of care  counseling/discussion: ?Based upon previous documentation previous attending had numerous conversations with his family regarding his poor prognosis related to extensive necrosis of sacral area superimposed on underlying extensively metastatic cancer and severe pain related to both ?- Albumin is 1.9 and due to immobility and metastatic cancer, the wound will never heal ?- Wound can easily be re-infected with stool and urine ?- Palliative care has had multiple long conversations as well ?- his family has been allowing him to make medical decisions although it was explained the patient does not have a full understanding of his condition- as mentioned, he has been confused, hallucinating pulling out IVs and pulled out his Foley at times per previous MD. ?- psych eval placed in chart noting that he does not have the ability to make medical decisions at this time ?- although his daughter, ex wife and son were requested to do POA paperwork, his daughter informed  that they did not choose a POA ?Ethics team consulted on 3/20. Palliative also involved. ?Family are hesitant to pursue comfort care, cont on current aggressive care ? ?DVT prophylaxis: enoxaparin (LOVENOX) injection 40 mg Start: 06/20/21 2200 ?SCDs Start: 06/20/21 1351 ?Code Status:   Code Status: DNR ?Family Communication: plan of care discussed with patient RN at bedside. ?Patient status is: inpatient Level of care: Telemetry  ?Remains inpatient because: Ongoing management of extensive wound on the sacrum ?Patient currently not stable ? ?Dispo: The patient is from: Skilled nursing facility ?           Anticipated disposition: To be decided ? ?Mobility Assessment (last 72 hours)   ? ? Mobility Assessment   ? ? [place] Name 06/27/21 1940 06/26/21 2030 06/25/21 2056  ?  ?  ? Does patient have an order for bedrest or is patient medically unstable No - Continue assessment No - Continue assessment No - Continue assessment    ? What is the highest level of mobility based  on the progressive mobility assessment? Level 1 (Bedfast) - Unable to balance while sitting on edge of bed Level 1 (Bedfast) - Unable to balance while sitting on edge of bed Level 1 (Bedfast) - Unable to balance while sitting on edge of bed    ? Is the above level different from baseline mobility prior to current illness? Yes - Recommend PT order -- Yes - Recommend PT order    ? ?  ?  ? ?  ?  ? ?Objective: ?Vitals last 24 hrs: ?Vitals:  ? 06/27/21 2244 06/28/21 0305 06/28/21 0653 06/28/21 1134  ?BP: (!) 156/85 (!) 144/81 137/72 140/71  ?Pulse: (!) 101 96 93 99  ?Resp: '20 18 16 20  '$ ?Temp: 98.7 ?F (37.1 ?C) 98.8 ?F (37.1 ?C) 99.6 ?F (37.6 ?C) (!) 97.3 ?F (36.3 ?C)  ?TempSrc: Oral Oral Oral Oral  ?SpO2: 96% 91% 96% 93%  ?Height:      ? ?Weight change:  ? ?Physical Examination: ?General exam: AA, asking for diet Coke, otherwise no complaint older than stated age, weak appearing. ?HEENT:Oral mucosa moist, Ear/Nose WNL grossly, dentition normal. ?Respiratory system: bilaterally diminished BS, no use of accessory muscle ?Cardiovascular system: S1 & S2 +, No JVD,. ?Gastrointestinal system: Abdomen soft,NT,ND, BS+ ?Nervous System:Alert, awake, moving extremities and grossly nonfocal ?Extremities: LE edema ,distal peripheral pulses palpable.  ?Skin: No rashes,no icterus. ?MSK: Normal muscle bulk,tone, power ? ? ? ?  Medications reviewed:  ?Scheduled Meds: ? vitamin C  500 mg Oral BID  ? chlorhexidine  15 mL Mouth Rinse BID  ? Chlorhexidine Gluconate Cloth  6 each Topical Daily  ? docusate sodium  100 mg Oral BID  ? enoxaparin (LOVENOX) injection  40 mg Subcutaneous Q24H  ? gabapentin  300 mg Oral BID  ? guaiFENesin  600 mg Oral Q12H  ? hydrALAZINE  50 mg Oral TID  ? lidocaine  1 patch Transdermal Q24H  ? mouth rinse  15 mL Mouth Rinse q12n4p  ? methadone  20 mg Oral Q8H  ? metoprolol tartrate  25 mg Oral BID  ? pantoprazole  40 mg Oral BID  ? polyethylene glycol  17 g Oral BID  ? sodium hypochlorite   Irrigation BID   ? ?Continuous Infusions: ? cefTRIAXone (ROCEPHIN)  IV 2 g (06/27/21 1249)  ? metronidazole 500 mg (06/28/21 0214)  ? vancomycin 1,500 mg (06/27/21 2203)  ? ? ?Pressure Injury 06/21/21 Sacrum Medial Unstageable - Full thickne

## 2021-06-28 NOTE — Progress Notes (Addendum)
?   06/28/21 1030 hydrotherapy.  ?Subjective Assessment  ?Subjective I am sorry I have been so bad.  ?Patient and Family Stated Goals family wants hydrotherapy performed  ?Date of Onset  ?(present on admission)  ?Prior Treatments dressing changes  ?Evaluation and Treatment  ?Evaluation and Treatment Procedures Explained to Patient/Family Yes  ?Evaluation and Treatment Procedures Other (comment) ?(family agrees, pt is intermittently confused)  ?Pressure Injury 06/21/21 Sacrum Medial Unstageable - Full thickness tissue loss in which the base of the injury is covered by slough (yellow, tan, gray, green or brown) and/or eschar (tan, brown or black) in the wound bed.  ?Date First Assessed/Time First Assessed: 06/21/21 0800   Location: Sacrum  Location Orientation: Medial  Staging: Unstageable - Full thickness tissue loss in which the base of the injury is covered by slough (yellow, tan, gray, green or brown) and/or ...  ?Wound Image   ?Dressing Type Gauze (Comment);ABD;Other (Comment);Normal saline moist dressing;Silicone dressing ?(no more Dakins for now- silicon dressing placed below lower border of wound ove excoriated area.  Marland Kitchen)  ?Dressing Other (Comment);Changed  ?Dressing Change Frequency Twice a day  ?State of Healing Early/partial granulation  ?Site / Wound Assessment Bleeding;Black;Brown;Painful;Red;Other (Comment) ?(large amounts of necrotic tissue noted purulent drainge.)  ?% Wound base Red or Granulating 20%  ?% Wound base Yellow/Fibrinous Exudate 80%  ?Peri-wound Assessment Bleeding;Excoriated;Pink;Other (Comment)  ?Wound (cm) 16 cm x 12,6 cm,  -see surgery note for details  ?Margins Unattached edges (unapproximated)  ?Drainage Amount Copious  ?Drainage Description Odor - foul;Purulent  ?Treatment Debridement (Selective)  ?Hydrotherapy  ?Pulsed Lavage with Suction (psi) 12 psi  ?Pulsed Lavage with Suction - Normal Saline Used 1000 mL  ?Pulsed Lavage Tip Tip with splash shield  ?Pulsed lavage therapy - wound  location sacral wound  ?Selective Debridement  ?Selective Debridement - Location sacrum  ?Selective Debridement - Tools Used Forceps;Scissors;Scalpel  ?Selective Debridement - Tissue Removed softening eschar, black and gray necrotic tissue  ?Wound Therapy - Assess/Plan/Recommendations  ?Wound Therapy - Clinical Statement Randall Como PA in to see wound. recommends NS guaze packing. Patient initially stating that he diid not want to be in pain. Prmedicated  prior to tratment. continue Hydrotherapy.  ?Wound Therapy - Functional Problem List paraplegi d/t metastatic prostate cancer  ?Factors Delaying/Impairing Wound Healing Altered sensation;Incontinence;Infection - systemic/local;Immobility;Multiple medical problems  ?Hydrotherapy Plan Debridement;Dressing change;Patient/family education;Pulsatile lavage with suction  ?Wound Therapy - Frequency 6X / week  ?Wound Therapy - Follow Up Recommendations dressing changes by RN  ?Wound Therapy Goals - Improve the function of patient's integumentary system by progressing the wound(s) through the phases of wound healing by:  ?Decrease Necrotic Tissue to 80  ?Decrease Necrotic Tissue - Progress Progressing toward goal  ?Increase Granulation Tissue to 20  ?Increase Granulation Tissue - Progress Progressing toward goal  ?Patient/Family will be able to  verbalize dressing changes and purpose of hydrotherapy  ?Patient/Family Instruction Goal - Progress Progressing toward goal  ?Goals/treatment plan/discharge plan were made with and agreed upon by patient/family No, Patient unable to participate in goals/treatment/discharge plan and family unavailable  ?Time For Goal Achievement 2 weeks  ?Wound Therapy - Potential for Goals Poor  ? ?Randall Larsen PT ?Acute Rehabilitation Services ?Pager [phone] ?Office [phone] ? Deb >20 =1, gun and shiel 2 dress  ?

## 2021-06-28 NOTE — Progress Notes (Signed)
Irrigated patient's foley after 628 mls showed on bladder scanner. 350 mls of amber urine was emptied from foley. ?

## 2021-06-29 DIAGNOSIS — A419 Sepsis, unspecified organism: Secondary | ICD-10-CM | POA: Diagnosis not present

## 2021-06-29 LAB — BASIC METABOLIC PANEL
Anion gap: 7 (ref 5–15)
BUN: 19 mg/dL (ref 8–23)
CO2: 20 mmol/L — ABNORMAL LOW (ref 22–32)
Calcium: 7.9 mg/dL — ABNORMAL LOW (ref 8.9–10.3)
Chloride: 111 mmol/L (ref 98–111)
Creatinine, Ser: 0.73 mg/dL (ref 0.61–1.24)
GFR, Estimated: >60 mL/min (ref >60)
Glucose, Bld: 103 mg/dL — ABNORMAL HIGH (ref 70–99)
Potassium: 4 mmol/L (ref 3.5–5.1)
Sodium: 138 mmol/L (ref 135–145)

## 2021-06-29 LAB — CBC WITH DIFFERENTIAL/PLATELET
Abs Immature Granulocytes: 2.74 10*3/uL — ABNORMAL HIGH (ref 0.00–0.07)
Basophils Absolute: 0.1 10*3/uL (ref 0.0–0.1)
Basophils Relative: 0 %
Eosinophils Absolute: 0.1 10*3/uL (ref 0.0–0.5)
Eosinophils Relative: 0 %
HCT: 25.4 % — ABNORMAL LOW (ref 39.0–52.0)
Hemoglobin: 7.8 g/dL — ABNORMAL LOW (ref 13.0–17.0)
Immature Granulocytes: 15 %
Lymphocytes Relative: 11 %
Lymphs Abs: 1.9 10*3/uL (ref 0.7–4.0)
MCH: 31 pg (ref 26.0–34.0)
MCHC: 30.7 g/dL (ref 30.0–36.0)
MCV: 100.8 fL — ABNORMAL HIGH (ref 80.0–100.0)
Monocytes Absolute: 1.4 10*3/uL — ABNORMAL HIGH (ref 0.1–1.0)
Monocytes Relative: 8 %
Neutro Abs: 12 10*3/uL — ABNORMAL HIGH (ref 1.7–7.7)
Neutrophils Relative %: 66 %
Platelets: 286 10*3/uL (ref 150–400)
RBC: 2.52 MIL/uL — ABNORMAL LOW (ref 4.22–5.81)
RDW: 22.1 % — ABNORMAL HIGH (ref 11.5–15.5)
WBC: 18.1 10*3/uL — ABNORMAL HIGH (ref 4.0–10.5)
nRBC: 7 % — ABNORMAL HIGH (ref 0.0–0.2)

## 2021-06-29 NOTE — Progress Notes (Signed)
Pharmacy Antibiotic Note ? ?Randall Larsen. is a 66 y.o. male admitted on 06/20/2021 with  wound infection .  Pharmacy has been consulted for Vanco dosing. ? ?ID: Sepsis with large necrotic foul-smelling sacral decubitus ulcer. No evidence of osteo. Family refusing debridement surgery. No chance for healing.  ? ?Afebrile. WBC 18.1. on Dakins ? ?3/14 CTX> ?3/14 flagyl> ?3/14 vanc> ?- (missed dose 3/19 due to loss of IV access) ? ?3/14 BCx: NGF ? ?Plan: ?- Vanc currently 1500 mg q24h for est AUC 485  ?- Trough 3/23 PM adequate to assess clearance only with palliative antibiotics.  ?- Ceftriaxone 2g q24h and flagyl 500 mg q12h ? ? ? ?Height: '5\' 10"'$  (177.8 cm) ?IBW/kg (Calculated) : 73 ? ?Temp (24hrs), Avg:98.1 ?F (36.7 ?C), Min:97.3 ?F (36.3 ?C), Max:98.4 ?F (36.9 ?C) ? ?Recent Labs  ?Lab 06/24/21 ?0446 06/25/21 ?7510 06/25/21 ?2585 06/26/21 ?2778 06/27/21 ?2423 06/28/21 ?5361 06/29/21 ?0449  ?WBC 14.8* 16.3*  --  17.8* 17.2* 20.0* 18.1*  ?CREATININE 1.06  --  0.88 0.67 0.70  --  0.73  ?  ?CrCl cannot be calculated (Unknown ideal weight.).   ? ?No Known Allergies ? ? ?Randall Larsen S. Alford Highland, PharmD, BCPS ?Clinical Staff Pharmacist ?[email or web address] ? ?Alford Highland, [employer] ?06/29/2021 7:45 AM ? ?

## 2021-06-29 NOTE — Progress Notes (Signed)
?PROGRESS NOTE ?[name]  OQH:[id number] DOB: Apr 12, 1955 DOA: 06/20/2021 ?PCP: Hal Morales, DO  ? 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  He also had a Foley placed was meant to be permanent. He was supposed to start Zytiga and prednisone but ended up back in the hospital on 06/20/21.  Poor historian/ex-wife and daughter live in [place] and states that he they came to visit him last week and noticed an extreme foul smell coming from him, on visit 4 days prior they were told repeatedly that "he is getting better" but continued to notice the foul smell and therefore request that he be sent to the hospital. ? ?In the ED he was noted to have a temperature of 102.3, heart rate in the 100s, and extensive necrotic area on both of his buttocks, WBC count of 13.3 and was started on IV antibiotics.  General surgery was consulted and and debrided the area.  Wound postdebridement measured 12 x 13 x 6 cm. ?Rapid response was called  as he was "short of breath, anxious, febrile". CXR showed some pulm congestion but patient was not short of breath. ?Patient is being treated for sepsis with large necrotic foul-smelling sacral decubitus ulcer possible UTI in the setting of prostate cancer with mets to bone, cancer pain paraplegia with chronic Foley in place, anemia. ?Palliative care has been consulted for goals of care discussion due to complex nature of his medical illness.  ?  ?Subjective: ? ?Seen and examined this morning.  Patient reports he wants "break". ?Does not want dressing changed  today. ?Overnight afebrile with mild tachycardia borderline. ?Labs reviewed WBC 18.1 ? ?Assessment and Plan: ?Principal Problem: ?  Sepsis due to undetermined organism [facility]) ?Active Problems: ?  Prostate cancer metastatic to bone [facility]) ?  Pain ?  Essential hypertension ?  Flaccid diplegia of lower extremities (HCC) ?  Normocytic anemia ?  Unstageable pressure ulcer of sacral region [facility]) ?  Goals of care, counseling/discussion ?  Metabolic acidosis ? ?Sepsis with large necrotic foul-smelling sacral decubitus ulcer ?Possible UTI: ?Chronic Foley in place with large decubitus ulcer.  CT without evidence of osteomyelitis.Surgery on board s/p bedside debridement 3/15.Blood cultures negative so far.  Surgery has given option for debridement in the OR but family refused currently and on hydrotherapy surgery.WBC count remains elevated, patient remains on broad-spectrum antibiotics- Vanco/Flagyl/Rocephin.  Family/palliative care having ongoing goals of care, noted family meeting/discussion: Possible transition to comfort measures.  Patient does not want dressing changed.   ?Given patient's functional debility, deconditioning, poor nutritional status nonambulatory status prostate cancer with mets surgery feels the bed sore will likley never heal. ? ?Prostate cancer with metastasis to bone extensive cancer with uncontrolled pain in the back sacral area chest paraplegic with chronic Foley in place family does not want escalation of care but has not decided on comfort care either.  Continue with pain management-for severe cancer related pain with methadone oral and IV narcotics,palliative care following ? ?Anemia of chronic disease needing 1 unit PRBC transfusion for 6.9 hemoglobin.  Monitor and transfuse as needed.  Hemoglobin at 7.8 g. ?Recent Labs  ?Lab 06/25/21 ?5465 06/26/21 ?[place]  06/27/21 ?8832 06/28/21 ?5498 06/29/21 ?0449  ?HGB 6.9* 8.5* 8.3* 7.9* 7.8*  ?HCT 22.3* 27.2* 26.7* 25.4* 25.4*  ?  ? ?Pedal  edema ?Paraplegia ?Chronic Foley ?Metabolic acidosis monitor bicarb level. ?Consulted dietitian to augment nutrition ? ?Goals of care counseling/discussion: ?Based upon previous documentation previous attending had numerous conversations with his family regarding his poor prognosis related to extensive necrosis of sacral area superimposed on underlying extensively metastatic cancer and severe pain related to both ?Albumin is 1.9 and due to immobility and metastatic cancer, the wound will never heal ?Wound can easily be re-infected with stool and urine ?Palliative care has had multiple long conversations as well ?his family has been allowing him to make medical decisions although it was explained the patient does not have a full understanding of his condition- as mentioned, he has been confused, hallucinating pulling out IVs and pulled out his Foley at times per previous MD. ?psych eval placed in chart noting that he does not have the ability to make medical decisions at this time ?Ethics team consulted on 3/20. Palliative also involved. Ongoing discussion for transition to comfort measures ? ?DVT prophylaxis: enoxaparin (LOVENOX) injection 40 mg Start: 06/20/21 2200 ?SCDs Start: 06/20/21 1351 ?Code Status:   Code Status: DNR ?Family Communication: plan of care discussed with patient RN at bedside. ?Patient status is: inpatient Level of care: Telemetry  ?Remains inpatient because: Ongoing management of extensive wound on the sacrum ?Patient currently not stable ? ?Dispo: The patient is from: Skilled nursing facility ?           Anticipated disposition: TBD ? ?Mobility Assessment (last 72 hours)   ? ? Mobility Assessment   ? ? [place] Name 06/28/21 1922 06/28/21 0730 06/27/21 1940 06/26/21 2030  ?  ? Does patient have an order for bedrest or is patient medically unstable No - Continue assessment No - Continue assessment No - Continue assessment No - Continue assessment   ? What is the highest level of mobility based on  the progressive mobility assessment? Level 1 (Bedfast) - Unable to balance while sitting on edge of bed Level 1 (Bedfast) - Unable to balance while sitting on edge of bed Level 1 (Bedfast) - Unable to balance while sitting on edge of bed Level 1 (Bedfast) - Unable to balance while sitting on edge of bed   ? Is the above level different from baseline mobility prior to current illness? Yes - Recommend PT order Yes - Recommend PT order Yes - Recommend PT order --   ? ?  ?  ? ?  ?  ? ?Objective: ?Vitals last 24 hrs: ?Vitals:  ? 06/28/21 1134 06/28/21 1501 06/28/21 2116 06/29/21 0403  ?BP: 140/71 130/74 115/72 133/70  ?Pulse: 99 99 99 (!) 101  ?Resp: '20 18 15 14  '$ ?Temp: (!) 97.3 ?F (36.3 ?C) 98.3 ?F (36.8 ?C) 98.4 ?F (36.9 ?C) 98.4 ?F (36.9 ?C)  ?TempSrc: Oral Oral Axillary Axillary  ?SpO2: 93% 94% 93% 90%  ?Height:      ? ?Weight change:  ? ?Physical Examination: ?General exam: AA oriented to hospital but tells me its [facility] could not tell me president's name but answered with prompt, thinks IT IS 2023 ?HEENT:Oral mucosa moist, Ear/Nose WNL grossly, dentition normal. ?Respiratory system: bilaterally diminished,no use of accessory muscle ?Cardiovascular system: S1 & S2 +, No JVD,. ?Gastrointestinal system: Abdomen soft,NT,ND, BS+ ?Nervous System:Alert, awake, moving extremities and grossly nonfocal ?Extremities: edema b/l LE 3+,distal peripheral pulses palpable.  ?Skin: No rashes,no  icterus. ?MSK: Normal muscle bulk,tone, power bedsore as below ? ? ? ?Medications reviewed:  ?Scheduled Meds: ? vitamin C  500 mg Oral BID  ? chlorhexidine  15 mL Mouth Rinse BID  ? Chlorhexidine Gluconate Cloth  6 each Topical Daily  ? docusate sodium  100 mg Oral BID  ? enoxaparin (LOVENOX) injection  40 mg Subcutaneous Q24H  ? gabapentin  300 mg Oral BID  ? guaiFENesin  600 mg Oral Q12H  ? hydrALAZINE  50 mg Oral TID  ? lidocaine  1 patch Transdermal Q24H  ? mouth rinse  15 mL Mouth Rinse q12n4p  ? methadone  20 mg Oral Q8H  ?  metoprolol tartrate  25 mg Oral BID  ? pantoprazole  40 mg Oral BID  ? polyethylene glycol  17 g Oral BID  ? sodium hypochlorite   Irrigation BID  ? ?Continuous Infusions: ? cefTRIAXone (ROCEPHIN)  IV Stopped (06/28/21 1534)  ? metronida

## 2021-06-29 NOTE — Progress Notes (Signed)
PT Cancellation Note ? ?Patient Details ?Name: Randall Larsen. ?MRN: [id number] ?DOB: [birth year] ? ? ?Cancelled Treatment:    Reason Eval/Treat Not Completed: Patient declined, no reason specified;Other (comment) (pt slightly emotional and stating he is "exhausted". pt asking for rest day and for [place] to come back tomorrow. Will re-attempt tomorrow.) ? ? ?Gwynneth Albright PT, DPT ?Acute Rehabilitation Services ?Office [phone] ?Pager [phone] ? ?

## 2021-06-29 NOTE — Progress Notes (Signed)
Chaplain provided emotional support to [place].  He was more alert today and was grateful that he had an opportunity to talk with his kids.  He asked to be cleaned up but had just been cleaned up just recently but had not remembered.  He fell back asleep.  He is grateful for the care that the team is giving him. ? ?[employer], Bcc ?Pager, [phone] ?

## 2021-06-29 NOTE — Care Management Important Message (Signed)
Important Message ? ?Patient Details IM Letter placed in Patients room. ?Name: Randall Larsen. ?MRN: [id number] ?Date of Birth: Aug 24, 1955 ? ? ?Medicare Important Message Given:  Yes ? ? ? ? ?Kerin Salen ?06/29/2021, 9:33 AM ?

## 2021-06-29 NOTE — Plan of Care (Signed)

## 2021-06-30 DIAGNOSIS — R52 Pain, unspecified: Secondary | ICD-10-CM | POA: Diagnosis not present

## 2021-06-30 DIAGNOSIS — E440 Moderate protein-calorie malnutrition: Secondary | ICD-10-CM | POA: Insufficient documentation

## 2021-06-30 DIAGNOSIS — A419 Sepsis, unspecified organism: Secondary | ICD-10-CM | POA: Diagnosis not present

## 2021-06-30 DIAGNOSIS — Z7189 Other specified counseling: Secondary | ICD-10-CM | POA: Diagnosis not present

## 2021-06-30 DIAGNOSIS — G8220 Paraplegia, unspecified: Secondary | ICD-10-CM | POA: Diagnosis not present

## 2021-06-30 LAB — VANCOMYCIN, TROUGH: Vancomycin Tr: 37 ug/mL (ref 15–20)

## 2021-06-30 MED ORDER — JUVEN PO PACK
1.0000 | PACK | Freq: Two times a day (BID) | ORAL | Status: DC
  Filled 2021-06-30 (×6): qty 1

## 2021-06-30 MED ORDER — BOOST / RESOURCE BREEZE PO LIQD CUSTOM
1.0000 | Freq: Three times a day (TID) | ORAL | Status: DC
  Administered 2021-06-30 – 2021-07-02 (×2): 1 via ORAL

## 2021-06-30 MED ORDER — ADULT MULTIVITAMIN W/MINERALS CH
1.0000 | ORAL_TABLET | Freq: Every day | ORAL | Status: DC
  Administered 2021-06-30: 1 via ORAL
  Filled 2021-06-30 (×2): qty 1

## 2021-06-30 NOTE — Progress Notes (Signed)
? ? ?   ?Subjective: ?CC: ?Seen with hydro. Complains of rib pain with turning.  ? ?Objective: ?Vital signs in last 24 hours: ?Temp:  [98.1 ?F (36.7 ?C)-98.8 ?F (37.1 ?C)] 98.3 ?F (36.8 ?C) (03/24 0522) ?Pulse Rate:  [83-92] 92 (03/24 1054) ?Resp:  [18-19] 18 (03/24 0522) ?BP: (123-150)/(62-75) 150/69 (03/24 1054) ?SpO2:  [90 %-95 %] 93 % (03/24 1054) ?Last BM Date : 06/29/21 ? ?Intake/Output from previous day: ?03/23 0701 - 03/24 0700 ?In: 480 [P.O.:480] ?Out: -  ?Intake/Output this shift: ?Total I/O ?In: 0  ?Out: 100 [Urine:100] ? ?PE: ?Sacral wound as noted below. The wound measures 16cm x 12cm x 6cm. There is undermining/tracking of ~6cm right superior laterally, ~6cm left superior laterally and ~4-5cm right inferior. There is some granulation tissue at the inferior and left superior aspect of the wound with mixed fibropurulent and necrotic tissue more central and superior. There is still some eschar on wound edges. Today there is some bloody drainage without obvious purulence  ? ? ? ? ?Lab Results:  ?Recent Labs  ?  06/28/21 ?0637 06/29/21 ?0449  ?WBC 20.0* 18.1*  ?HGB 7.9* 7.8*  ?HCT 25.4* 25.4*  ?PLT 312 286  ? ?BMET ?Recent Labs  ?  06/29/21 ?0449  ?NA 138  ?K 4.0  ?CL 111  ?CO2 20*  ?GLUCOSE 103*  ?BUN 19  ?CREATININE 0.73  ?CALCIUM 7.9*  ? ?PT/INR ?No results for input(s): LABPROT, INR in the last 72 hours. ?CMP  ?   ?Component Value Date/Time  ? NA 138 06/29/2021 0449  ? K 4.0 06/29/2021 0449  ? CL 111 06/29/2021 0449  ? CO2 20 (L) 06/29/2021 0449  ? GLUCOSE 103 (H) 06/29/2021 0449  ? BUN 19 06/29/2021 0449  ? CREATININE 0.73 06/29/2021 0449  ? CREATININE 1.05 06/14/2021 1119  ? CALCIUM 7.9 (L) 06/29/2021 0449  ? PROT 5.7 (L) 06/21/2021 1546  ? ALBUMIN 1.9 (L) 06/21/2021 1546  ? AST 18 06/21/2021 1546  ? AST 14 (L) 06/14/2021 1119  ? ALT 16 06/21/2021 1546  ? ALT 14 06/14/2021 1119  ? ALKPHOS 211 (H) 06/21/2021 1546  ? BILITOT 0.7 06/21/2021 1546  ? BILITOT 0.5 06/14/2021 1119  ? GFRNONAA >60 06/29/2021  0449  ? GFRNONAA >60 06/14/2021 1119  ? GFRAA >60 05/03/2018 1410  ? ?Lipase  ?   ?Component Value Date/Time  ? LIPASE 20 04/02/2021 1742  ? ? ?Studies/Results: ?No results found. ? ?Anti-infectives: ?Anti-infectives (From admission, onward)  ? ? Start     Dose/Rate Route Frequency Ordered Stop  ? 06/21/21 2200  vancomycin (VANCOREADY) IVPB 1500 mg/300 mL       ? 1,500 mg ?150 mL/hr over 120 Minutes Intravenous Every 24 hours 06/20/21 2102    ? 06/21/21 1200  cefTRIAXone (ROCEPHIN) 2 g in sodium chloride 0.9 % 100 mL IVPB       ? 2 g ?200 mL/hr over 30 Minutes Intravenous Every 24 hours 06/20/21 1623    ? 06/20/21 1430  vancomycin (VANCOREADY) IVPB 2000 mg/400 mL       ? 2,000 mg ?200 mL/hr over 120 Minutes Intravenous  Once 06/20/21 1417 06/20/21 2247  ? 06/20/21 1400  cefTRIAXone (ROCEPHIN) 2 g in sodium chloride 0.9 % 100 mL IVPB  Status:  Discontinued       ? 2 g ?200 mL/hr over 30 Minutes Intravenous Every 24 hours 06/20/21 1345 06/20/21 1345  ? 06/20/21 1400  metroNIDAZOLE (FLAGYL) IVPB 500 mg       ?  500 mg ?100 mL/hr over 60 Minutes Intravenous Every 12 hours 06/20/21 1345    ? 06/20/21 1315  cefTRIAXone (ROCEPHIN) 2 g in sodium chloride 0.9 % 100 mL IVPB  Status:  Discontinued       ? 2 g ?200 mL/hr over 30 Minutes Intravenous Every 24 hours 06/20/21 1302 06/20/21 1623  ? ?  ? ? ? ?Assessment/Plan ?Unstageable sacral decubitus pressure ulcer in a patient with metastatic prostate cancer ?- S/p bedside debridement 3/15 ?- CT w/o evidence of osteo ?- Pressure relief with air overlay mattress and frequent turning to displace pressure ?- Patient found to not have capacity per psych. Please see separate progress notes for detailed discussions with patients family. Briefly our team has offered debridement of sacral wound under anesthesia to create an open wound to expose all surfaces in hopes of controlling infecting and getting a "clean" wound. We discussed that patient would still require turning/wound care  (dressing changes, hydrotherapy etc) to continue to promote a clean wound after surgery. He has been having pain with current wound care, especially with turns and I suspect at least some of his pain is from his mets to his bone as he complains of back/rib pain with turning. We discussed his wound would likely never heal given his non-ambulatory status and nutritional status. His remains elevated despite IV abx. Family still undergoing [place] discussions with palliative to decide if they would like surgery, comfort care etc. We will continue to follow along and plan to see again on Monday with hydrotherapy. Please contact us with any changes or with any questions or concerns. We are available to discuss further with family if they need any additional information or have any questions. IV abx per primary.   ?  ?FEN: Reg ?VTE: SCDs, Lovenox ?ID: Rocephin/Vanc/Flagyl ? ? LOS: 10 days  ? ? ?Jillyn Ledger , PA-C ?[facility] ?06/30/2021, 11:20 AM ?Please see Amion for pager number during day hours 7:00am-4:30pm ? ?

## 2021-06-30 NOTE — Progress Notes (Signed)
? ?Palliative Medicine Inpatient Follow Up Note ? ? ?Chart reviewed.  ? ?Mr. Pham is asleep. Somewhat somnolent. Briefly opens eyes but does not respond. No distress noted. No family at the bedside. Appetite remains poor.  ? ?I spoke with [place] via phone. Her mother and brother is working and unable to join call. She tells me they have spoken to [facility] and provided times for them to visit. Family is trying to arrange to get her this weekend. Genelle Gather is concerned her mother and brother may not be able to come due to work obligations and she is anxious about proceeding alone. Support provided. Explained if they were unable to attend maybe she could video chat to allow them to be a part of the tour.  ? ?Updates provided. Family is planning to proceed with residential hospice placement. I offered to move forward with the referral and start ongoing discussions with [facility]'s team to make sure they had all questions answered and once bed was available we could get him transferred. Genelle Gather verbalized understanding and expressed family would be in agreement with that based on their discussions. She confirms wishes for referral.  ? ?Goals are clearly expressed to continue to treat the treatable. With priority of making sure Zakhi is not suffering or in uncontrollable pain per family. They are not interested in surgical interventions. Ok with hydrotherapy if patient tolerates or allows. If treatment is deferred they are ok with this knowing it is to allow for his comfort.  ? ?Discussed the importance of continued conversation with family and their  medical providers regarding overall plan of care and treatment options, ensuring decisions are within the context of the patients values and GOCs. ? ?Questions addressed and support provided.   ? ?Objective Assessment: ?Vital Signs ?Vitals:  ? 06/30/21 1054 06/30/21 1325  ?BP: (!) 150/69 (!) 146/66  ?Pulse: 92 84  ?Resp:  20  ?Temp:  98 ?F (36.7 ?C)  ?SpO2: 93% 93%   ? ? ?Intake/Output Summary (Last 24 hours) at 06/30/2021 1438 ?Last data filed at 06/30/2021 0944 ?Gross per 24 hour  ?Intake 0 ml  ?Output 100 ml  ?Net -100 ml  ? ? ? ?Gen: somnolent, chronically ill-appearing ?[id number]  ?PULM: diminished  ?ABD: soft/nontender/nondistended/normal bowel sounds ?EXT: Bilateral lower extremity edema ?Neuro: somnolent, unable to answer questions  ? ?SUMMARY OF RECOMMENDATIONS   ?Updates provided to [place]. Family is in agreement with outpatient hospice ([facility]). They are trying to get into town to visit facility with concerns for care given recent care at [facility]. They have spoken to [facility] facility for tour times. Daughter has confirmed that family is in agreement with placing referral and beginning discussions etc for [facility]. I have spoken with Fabio Pierce, RN ([place]). [place] [facility] currently does not have any beds.  ?Continue with current plan of care per medical team. No escalation in care. No surgical interventions. Family ok if wound therapy is deferred to allow comfort for patient.  ?Extensive discussion with family today. Much more reasonable and realistic. Appropriate questions asked regarding comfort and hospice facility. They are trying to arrange flights or transportation to return to [facility] and visit patient while proceeding with comfort focused care and hospice home referral. They would prefer [facility] in [place] once final decision. See above for detailed discussions.  ?Will plan to follow-up with family on Monday for support and ongoing discussions. Anticipate transitioning to hospice home once approved and bed becomes available. Family are aware patient is  at risk of sudden death or decompensation. Does not wish for him to suffer.  ?DNR/DNI confirmed  ?PMT will continue to support and follow. Please secure chat for urgent needs. I will be off service until Monday 3/27.  ? ?Symptom Management:  ?Neoplasm related pain ?Dilaudid IV as  needed.  Will increase to 1-2 mg. ?Celebrex 200 mg twice daily ?Gabapentin 300 mg twice daily ?Dilaudid 4 mg by mouth every 3 hours as needed for breakthrough pain.  Patient has not received oral dose since yesterday.  Encourage frequent evaluation of pain and administer medications as needed. ?Methadone 20 mg every 8 hours ?Constipation ?MiraLAX twice daily ?Oral care ?Carmex to lips ?Mouth care twice daily and as needed ?Anxiety ?Ativan 0.5 mg as needed ?Nausea ?Zofran 4 mg every 6 hours as needed ? ?Time Total: 50 min.  ? ?Visit consisted of counseling and education dealing with the complex and emotionally intense issues of symptom management and palliative care in the setting of serious and potentially life-threatening illness.Greater than 50%  of this time was spent counseling and coordinating care related to the above assessment and plan. ? ?Alda Lea, AGPCNP-BC  ?[place] ? ?Palliative Medicine Team providers are available by phone from 7am to 7pm daily and can be reached through the team cell phone. Should this patient require assistance outside of these hours, please call the patient's attending physician.  ?

## 2021-06-30 NOTE — Progress Notes (Signed)
Chaplain was able to reach Randall Larsen from the [place] and he plans to come and see patient and provide spiritual support when he is able.  ? ?[employer], Bcc ?Pager, [phone] ?10:30 AM ? ?

## 2021-06-30 NOTE — Progress Notes (Signed)
Initial Nutrition Assessment ? ?DOCUMENTATION CODES:  ? ?Non-severe (moderate) malnutrition in context of chronic illness ? ?INTERVENTION:  ? ?-Boost Breeze po TID, each supplement provides 250 kcal and 9 grams of protein ? ?-Juven BID, each serving provides 95kcal and 2.5g of protein (amino acids glutamine and arginine)  ? ?-Multivitamin with minerals daily ? ?If remains full code: recommend 220 mg zinc sulfate daily x 14 days. ? ?NUTRITION DIAGNOSIS:  ? ?Moderate Malnutrition related to chronic illness, cancer and cancer related treatments as evidenced by mild fat depletion, mild muscle depletion. ? ?GOAL:  ? ?Patient will meet greater than or equal to 90% of their needs ? ?MONITOR:  ? ?PO intake, Supplement acceptance, Weight trends, I & O's, Labs (GOC) ? ?REASON FOR ASSESSMENT:  ? ?Consult ?Assessment of nutrition requirement/status, Malnutrition Eval ? ?ASSESSMENT:  ? 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 ? ?Patient in room, tech doing vitals. Pt wincing in pain most of visit. Pt reports no appetite. Has not eaten today. Per tech in room, pt ate bites of breakfast this morning. Mainly drinking some water and juice. Pt agreeable to trying Boost Breeze and Juven supplements.  ?Per documentation, consumed 10-20% of meal yesterday.  ?Pt pending [place] discussions with family per chart review. ? ?When asked if pt was eating okay at facility, he says "I gather". Not able to get a clear intake history. ? ?Per weight records, no weight has been recorded for this admission. Last recorded weight from 05/30/21 - 226 lbs.  ? ?Medications: Vitamin C,  Colace, Miralax ? ?Labs reviewed. ? ?NUTRITION - FOCUSED PHYSICAL EXAM: ? ?Flowsheet Row Most Recent Value  ?Orbital Region Moderate depletion  ?Upper Arm Region Mild depletion  [moderate edema]  ?Thoracic and Lumbar Region No depletion  ?Buccal Region Mild depletion  ?Temple Region Mild depletion  ?Clavicle Bone Region Mild depletion  ?Clavicle and Acromion Bone Region Mild depletion  ?Scapular Bone Region Unable to assess  ?Dorsal Hand Mild depletion  ?Patellar Region Unable to assess  [pain]  ?Anterior Thigh Region Unable to assess  ?Posterior Calf Region Unable to assess  ?Edema (RD Assessment) None  ?Hair Reviewed  ?Eyes Reviewed  ?Mouth Reviewed  [dry]  ?Skin Reviewed  ? ?  ? ? ?Diet Order:   ?Diet Order   ? ?       ?  Diet regular Room service appropriate? Yes; Fluid consistency: Thin  Diet effective now       ?  ? ?  ?  ? ?  ? ? ?EDUCATION NEEDS:  ? ?Not appropriate for education at this time ? ?Skin:  Skin Assessment: Skin Integrity Issues: ?Skin Integrity Issues:: Unstageable ?Unstageable: sacrum ? ?Last BM:  3/23 -type 4 ? ?Height:  ? ?Ht Readings from Last 1 Encounters:  ?06/20/21 '5\' 10"'$  (1.778 m)  ? ? ?Weight:  ? ?Wt Readings from Last 1 Encounters:  ?06/01/21 103.1 kg  ? ? ?BMI:  Body mass index is 32.61 kg/m?. ? ?Estimated Nutritional Needs:  ? ?Kcal:  1900-2100 ? ?Protein:  110-120g ? ?Fluid:  1.9L/day ? ?Clayton Bibles, MS, RD, LDN ?Inpatient Clinical Dietitian ?Contact information available via Amion ? ?

## 2021-06-30 NOTE — Progress Notes (Signed)
?PROGRESS NOTE ?Randall Larsen.  BSJ:[id number] DOB: 1956-03-29 DOA: 06/20/2021 ?PCP: Hal Morales, DO  ? 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  He also had a Foley placed was meant to be permanent. He was supposed to start Zytiga and prednisone but ended up back in the hospital on 06/20/21.  Poor historian/ex-wife and daughter live in [place] and states that he they came to visit him last week and noticed an extreme foul smell coming from him, on visit 4 days prior they were told repeatedly that "he is getting better" but continued to notice the foul smell and therefore request that he be sent to the hospital. ? ?In the ED he was noted to have a temperature of 102.3, heart rate in the 100s, and extensive necrotic area on both of his buttocks, WBC count of 13.3 and was started on IV antibiotics.  General surgery was consulted and and debrided the area.  Wound postdebridement measured 12 x 13 x 6 cm. ?Rapid response was called  as he was "short of breath, anxious, febrile". CXR showed some pulm congestion but patient was not short of breath. ?Patient is being treated for sepsis with large necrotic foul-smelling sacral decubitus ulcer possible UTI in the setting of prostate cancer with mets to bone, cancer pain paraplegia with chronic Foley in place, anemia. ?Palliative care has been consulted for goals of care discussion due to complex nature of his medical illness.  ?  ?Subjective: ?Seen and examined this morning.  Pleasant no complaint.  Alert awake. ?Afebrile overnight, blood pressure stable, on  room air ?Labs WBC 18.1 3/23 ? ?Assessment and Plan: ?Principal Problem: ?  Sepsis due to undetermined organism [facility]) ?Active Problems: ?  Prostate cancer metastatic to bone [facility]) ?  Pain ?  Essential hypertension ?  Flaccid diplegia of lower extremities (HCC) ?  Normocytic anemia ?  Unstageable pressure ulcer of sacral region [facility]) ?  Goals of care, counseling/discussion ?  Metabolic acidosis ? ?Sepsis with large necrotic foul-smelling sacral decubitus ulcer ?Possible UTI: ?Chronic Foley in place with large decubitus ulcer.  CT without evidence of osteomyelitis.Surgery on board s/p bedside debridement 3/15.Blood cultures negative so far.  Surgery has given option for debridement in the OR but family refused currently and on hydrotherapy surgery.WBC count remains elevated, patient remains on broad-spectrum antibiotics- Vanco/Flagyl/Rocephin.  Vanco level high but was random after the Vanco infusion Having ongoing goals of care discussion and awaiting for family meeting for possible transition to hospice. Patient did not want dressing changed 3/23.Given patient's functional debility, deconditioning, poor nutritional status nonambulatory status prostate cancer with mets surgery feels the bed sore will likley never heal. ? ?Prostate cancer with metastasis to bone extensive cancer with uncontrolled pain in the back sacral area and paraplegic with chronic Foley in place- family does not want escalation of care.Continue with pain management-for severe cancer related pain with methadone oral and IV narcotics,palliative care following closely ? ?Anemia of chronic disease s/p 1 unit PRBC for 6.9 hemoglobin. Improved in 7-8.Monitor ?Recent Labs  ?Lab 06/25/21 ?7654 06/26/21 ?6503 06/27/21 ?5465 06/28/21 ?6812 06/29/21 ?0449  ?HGB 6.9* 8.5* 8.3* 7.9* 7.8*  ?  HCT 22.3* 27.2* 26.7* 25.4* 25.4*  ?  ? ?Pedal edema ?Paraplegia ?Chronic Foley ?Metabolic acidosis monitor bicarb level. ?Consulted dietitian to augment nutrition ? ?Goals  of care counseling/discussion: ?Based upon previous documentation previous attending had numerous conversations with his family regarding his poor prognosis related to extensive necrosis of sacral area superimposed on underlying extensively metastatic cancer and severe pain related to both ?Albumin is 1.9 and due to immobility and metastatic cancer, the wound will never heal ?Wound can easily be re-infected with stool and urine ?Palliative care has had multiple long conversations as well ?his family has been allowing him to make medical decisions although it was explained the patient does not have a full understanding of his condition- as mentioned, he has been confused, hallucinating pulling out IVs and pulled out his Foley at times per previous MD. ?psych eval placed in chart noting that he does not have the ability to make medical decisions at this time ?Ethics team consulted on 3/20. Palliative also involved. Ongoing discussion for transition to comfort measures pending family meeting ? ?DVT prophylaxis: enoxaparin (LOVENOX) injection 40 mg Start: 06/20/21 2200 ?SCDs Start: 06/20/21 1351 ?Code Status:   Code Status: DNR ?Family Communication: plan of care discussed with patient RN at bedside. ?Patient status is: inpatient Level of care: Telemetry  ?Remains inpatient because: Ongoing management of extensive wound on the sacrum ?Patient currently not stable ?Dispo: The patient is from: Skilled nursing facility ?           Anticipated disposition: TBD ? ?Mobility Assessment (last 72 hours)   ? ? Mobility Assessment   ? ? [place] Name 06/29/21 2234 06/28/21 1922 06/28/21 0730 06/27/21 1940  ?  ? Does patient have an order for bedrest or is patient medically unstable No - Continue assessment No - Continue assessment No - Continue assessment No - Continue assessment   ? What is the highest level of mobility based on the progressive mobility assessment? Level 1 (Bedfast) - Unable to balance while sitting on edge of bed  Level 1 (Bedfast) - Unable to balance while sitting on edge of bed Level 1 (Bedfast) - Unable to balance while sitting on edge of bed Level 1 (Bedfast) - Unable to balance while sitting on edge of bed   ? Is the above level different from baseline mobility prior to current illness? No - Consider discontinuing PT/OT Yes - Recommend PT order Yes - Recommend PT order Yes - Recommend PT order   ? ?  ?  ? ?  ?  ? ?Objective: ?Vitals last 24 hrs: ?Vitals:  ? 06/29/21 0403 06/29/21 1648 06/29/21 1956 06/30/21 0522  ?BP: 133/70 123/62 (!) 145/75 (!) 143/75  ?Pulse: (!) 101 83 86 89  ?Resp: '14 19 18 18  '$ ?Temp: 98.4 ?F (36.9 ?C) 98.1 ?F (36.7 ?C) 98.8 ?F (37.1 ?C) 98.3 ?F (36.8 ?C)  ?TempSrc: Axillary Oral Oral Oral  ?SpO2: 90% 95% 94% 90%  ?Height:      ? ?Weight change:  ? ?Physical Examination: ?General exam: AA0X2,older than stated age, weak appearing. ?HEENT:Oral mucosa moist, Ear/Nose WNL grossly, dentition normal. ?Respiratory system: bilaterally diminished, no use of accessory muscle ?Cardiovascular system: S1 & S2 +, No JVD,. ?Gastrointestinal system: Abdomen soft,NT,ND,BS+ ?Nervous System:Alert, awake, moving extremities and grossly nonfocal ?Extremities: LE ankle edema ON le, distal peripheral pulses palpable.  ?Skin: No rashes,no icterus. ?MSK: Normal muscle bulk,tone, power  ?Sacral decubitus with dressing in place with use ulcer see picture in MEDIA. ? ?Medications reviewed:  ?  Scheduled Meds: ? vitamin C  500 mg Oral BID  ? chlorhexidine  15 mL Mouth Rinse BID  ? Chlorhexidine Gluconate Cloth  6 each Topical Daily  ? docusate sodium  100 mg Oral BID  ? enoxaparin (LOVENOX) injection  40 mg Subcutaneous Q24H  ? gabapentin  300 mg Oral BID  ? guaiFENesin  600 mg Oral Q12H  ? hydrALAZINE  50 mg Oral TID  ? lidocaine  1 patch Transdermal Q24H  ? mouth rinse  15 mL Mouth Rinse q12n4p  ? methadone  20 mg Oral Q8H  ? metoprolol tartrate  25 mg Oral BID  ? pantoprazole  40 mg Oral BID  ? polyethylene glycol  17 g Oral  BID  ? sodium hypochlorite   Irrigation BID  ? ?Continuous Infusions: ? cefTRIAXone (ROCEPHIN)  IV 2 g (06/29/21 1333)  ? metronidazole 500 mg (06/30/21 0345)  ? vancomycin 1,500 mg (06/29/21 2320)  ? ?

## 2021-06-30 NOTE — Progress Notes (Signed)
Physical Therapy Wound DISCHARGE ?Patient Details  ?Name: Randall Larsen. ?MRN: [id number] ?Date of Birth: [birth year] ? ?Today's Date: 06/30/2021 ?Time: 6767-2094 ?Time Calculation (min): 49 min ? ?Subjective  ?Subjective Assessment ?Subjective: I don't want to. ?Patient and Family Stated Goals: family wants hydrotherapy performed ?Date of Onset:  (present on admission) ?Prior Treatments: dressing changes  ?Pain Score:   ? ?Clinical Statement: Pt premedicated for hydrotherapy. Michael PA in to see wound. recommends NS guaze packing. Attempted to begin pulsed lavage for hydrotherapy but pt stating "make it stop"  "I don't want this". This is similar to prior vocalizations and this therapist discussed with pt options for dressin changes by nursing staff vs. more aggressive hydrotherapy. Given pt wound goals to prevent further infection and maintain wound status vs healing I do not believe it is neccessary to continue with PT hydrotherapy. Pt is agreeable with plan to have RN staff complete dressing changes and discontinue hydrotherapy. Updated note from palliative care indicates pt's family is comfortable with hydrotherapy being discontinued if pt is not tolerating treatments. Will sign off at this time as pt's wound has progressed and can now be managed with dressing changes from nursing staff. ? ?Wound Assessment  ?Pressure Injury 06/21/21 Sacrum Medial Unstageable - Full thickness tissue loss in which the base of the injury is covered by slough (yellow, tan, gray, green or brown) and/or eschar (tan, brown or black) in the wound bed. (Active)  ?Wound Image   06/30/21 1600  ?Dressing Type Gauze (Comment);ABD;Other (Comment);Normal saline moist dressing;Silicone dressing 70/96/28 1600  ?Dressing Other (Comment);Changed 06/30/21 1600  ?Dressing Change Frequency Twice a day 06/30/21 1600  ?State of Healing Early/partial granulation 06/30/21 1600  ?Site / Wound Assessment Bleeding;Black;Brown;Painful;Red;Other (Comment)  06/30/21 1600  ?% Wound base Red or Granulating 50% 06/30/21 1600  ?% Wound base Yellow/Fibrinous Exudate 50% 06/30/21 1600  ?% Wound base Black/Eschar 5% 06/27/21 1230  ?Peri-wound Assessment Bleeding;Excoriated;Pink;Other (Comment) 06/30/21 1600  ?Wound Length (cm) 14 cm 06/30/21 1600  ?Wound Width (cm) 10 cm 06/30/21 1600  ?Wound Depth (cm) 6 cm 06/30/21 1600  ?Wound Surface Area (cm^2) 140 cm^2 06/30/21 1600  ?Wound Volume (cm^3) 840 cm^3 06/30/21 1600  ?Tunneling (cm) 12 06/27/21 1230  ?Margins Unattached edges (unapproximated) 06/30/21 1600  ?Drainage Amount Copious 06/28/21 1030  ?Drainage Description Odor - foul;Purulent 06/28/21 1030  ?Treatment Debridement (Selective) 06/28/21 1030  ? ?Hydrotherapy ?Pulsed lavage therapy - wound location: sacral wound ?Pulsed Lavage with Suction (psi): 12 psi ?Pulsed Lavage with Suction - Normal Saline Used: 1000 mL ?Pulsed Lavage Tip: Tip with splash shield ?Selective Debridement ?Selective Debridement - Location: sacrum ?Selective Debridement - Tools Used: Forceps, Scissors, Scalpel ?Selective Debridement - Tissue Removed: softening eschar, black and gray necrotic tissue  ? ? ?Wound Assessment and Plan  ?Wound Therapy - Assess/Plan/Recommendations ?Wound Therapy - Clinical Statement: Pt premedicated for hydrotherapy. Michael PA in to see wound. recommends NS guaze packing. Attempted to begin pulsed lavage for hydrotherapy but pt stating "make it stop"  "I don't want this". This is similar to prior vocalizations and this therapist discussed with pt options for dressin changes by nursing staff vs. more aggressive hydrotherapy. Given pt wound goals to prevent further infection and maintain wound status vs healing I do not believe it is neccessary to continue with PT hydrotherapy. Pt is agreeable with plan to have RN staff complete dressing changes and discontinue hydrotherapy. Updated note from palliative care indicates pt's family is comfortable with hydrotherapy being  discontinued if pt  is not tolerating treatments. Will sign off at this time as pt's wound has progressed and can now be managed with dressing changes from nursing staff. ?Wound Therapy - Functional Problem List: paraplegi d/t metastatic prostate cancer ?Factors Delaying/Impairing Wound Healing: Altered sensation, Incontinence, Infection - systemic/local, Immobility, Multiple medical problems ?Hydrotherapy Plan: Debridement, Dressing change, Patient/family education, Pulsatile lavage with suction ?Wound Therapy - Frequency: Other (comment) (discharging hydro) ?Wound Therapy - Current Recommendations: Other (comment) (discharging from hydro, nursing to do dressing changes) ?Wound Therapy - Follow Up Recommendations: dressing changes by RN ? ?Wound Therapy Goals- ?Improve the function of patient's integumentary system by progressing the wound(s) through the phases of wound healing (inflammation - proliferation - remodeling) by: ?Wound Therapy Goals - Improve the function of patient's integumentary system by progressing the wound(s) through the phases of wound healing by: ?Decrease Necrotic Tissue to: 80 ?Increase Granulation Tissue to: 20 ?Patient/Family will be able to : verbalize dressing changes and purpose of hydrotherapy ?Goals/treatment plan/discharge plan were made with and agreed upon by patient/family: No, Patient unable to participate in goals/treatment/discharge plan and family unavailable ?Time For Goal Achievement: 2 weeks ?Wound Therapy - Potential for Goals: Poor ? ?Goals will be updated until maximal potential achieved or discharge criteria met.  Discharge criteria: when goals achieved, discharge from hospital, MD decision/surgical intervention, no progress towards goals, refusal/missing three consecutive treatments without notification or medical reason. ? ?GP ?   ? ?Charges ?PT Wound Care Charges ?$PT Hydrotherapy Dressing: 2 dressings ?$PT Hydrotherapy Visit: 1 Visit ? ? ?  ?  ?Gwynneth Albright PT,  DPT ?Acute Rehabilitation Services ?Office [phone] ?Pager [phone]  ? ?Jacques Navy ?06/30/2021, 4:40 PM ? ?

## 2021-06-30 NOTE — Ethics Note (Signed)
Ethics Consult Note ?Initial contact w/ medical team 06/26/21, which was on patient's hospital day 10  ? ?Source of Consult: Dr. Wynelle Cleveland ?Current attending physician/service: Antonieta Pert, MD ? ?Reason(s) for consult and ethical question(s):Family not accepting poor prognosis, patient suffering ?Patient at end of life metastatic prostate cancer with sepsis from large unstageable decubitus wound buttocks-see surgery note ? ?Information-gathering: ?Discussion with source of consult ?Chart review 06/26/21  ? ? ? ? ?Narrative:  ?Medical facts:Randall F Kylian Loh. is at end stage metastatic prostate cancer, does not have capacity per psychiatry.  His mental status waxes and wanes and he becomes combative, paranoid requiring medical restraints after pulling out foley and IV lines.Obvious signs of severe pain.  Dilaudid IV for pain ordered.  PO intake very poor, low albuminand continued increase in WBC despite antibiotics.  He has had several inpatient admissions in the past few months worsening physical and mental status with each admission. ?Patient's Personal/Social Facts:t patients family lives in [place], includes children and ex-wife.  They have visited [place] and have had difficulty making medical decisions for patient.  Daughter appears to be primary decision maker although she does refer to siblings and her mother (patient ex-wife) for input with decision making.  Patient was made DNR on this admission.  Culturally the patient and family are observing Muslims.  Medical insight is difficult for family.  Due to patients inconsistent mental status the daughter has difficulty understanding the severity of his medical condition.  At times the patient recognizes family and medical staff and can discuss, without accurate insight, his goals.  This is causing conflict for the daughter in making decisions regarding treatment vs comfort care. ? ? ? ? ?Recommendations: ? ?Chart reviewed.  I spoke with Eugenia Pancoast, [place]  NP who has been very engaged with the patient and family through past admissions and this current admission.  She is a trusted member of the care team for the family.  She reports that the family has been available for discussions both in person and over the phone after they returned to [place].  She has a call scheduled with patients daughter on 3/21 to communicate the continued decline poor prognosis and options for care focused on relief of suffering and allow for a natural death with medical and emotional support.  The option of hospice care would support those goals.  At this time the family is engaged and attempting to make decisions based on their understanding.  At this time there is not a specific ethical challenge that needs to be addressed as the family has made small, incremental decisions such as DNR and no escalation of current care plan. I will stay in contact with Elmyra Ricks, NP should she feel that the medical futility process should be envoked.  Will continue to follow progress of discussions and engage as needed. ? ? ? ?Thank you for this consult. Ethics will continue to shadow in this case as needed, but will sign of for now. Ethics has no more to offer at this time.  ? ? ?Secure message on Epic is also welcome but may not receive an immediate response.  ?Please reference AMION for on-call committee member if needed.  ? ?Kizzie Fantasia, MSN, RN-BC, CHPN, HEC-C ?[place]  ?  ? ? ? ? ? ? ? ? ? ? ? ? ? ? ? ? ? ? ? ? ? ? ? ? ? ? ? ? ? ? ? ? ? ? ? ? ? ? ? ? ? ? ? ? ? ? ? ? ? ? ? ? ? ? ? ? ? ? ? ? ? ? ? ? ? ? ? ? ? ? ? ? ? ? ? ? ? ? ? ? ? ? ? ? ? ? ? ? ? ? ? ? ? ? ? ? ? ? ? ? ? ? ? ? ? ? ? ? ? ? ? ? ? ? ? ? ? ? ? ? ? ? ? ? ? ? ? ? ? ? ? ? ? ? ? ? ? ? ? ? ? ? ? ? ? ? ? ? ? ? ? ? ? ? ? ? ? ? ? ? ? ?

## 2021-06-30 NOTE — Progress Notes (Addendum)
Pharmacy Antibiotic Note ? ?Randall Larsen. is a 66 y.o. male admitted on 06/20/2021 with  wound infection .  Pharmacy has been consulted for Vanco dosing. ? ? ?ID: Sepsis with large necrotic foul-smelling sacral decubitus ulcer. No evidence of osteo. Family refusing debridement surgery. No chance for healing. Tried to check Vanco trough 3/23 PM but pt refused. Disregard level 37 this AM as this is not a trough and was drawn 5 hrs after last dose given. ? ?Afebrile. WBC 18.1. on Dakins ? ?3/14 CTX> ?3/14 flagyl> ?3/14 vanc> ?- (missed dose 3/19 due to loss of IV access) ? ?3/14 BCx: NGF ? ?Plan: ?- Vanc currently 1500 mg q24h for est AUC 485  ?- Ceftriaxone 2g q24h and flagyl 500 mg q12h ?- Attempt Vanco trough again 3/24 PM ? ? ? ?Height: '5\' 10"'$  (177.8 cm) ?IBW/kg (Calculated) : 73 ? ?Temp (24hrs), Avg:98.4 ?F (36.9 ?C), Min:98.1 ?F (36.7 ?C), Max:98.8 ?F (37.1 ?C) ? ?Recent Labs  ?Lab 06/24/21 ?0446 06/25/21 ?7062 06/25/21 ?3762 06/26/21 ?8315 06/27/21 ?1761 06/28/21 ?6073 06/29/21 ?0449  ?WBC 14.8* 16.3*  --  17.8* 17.2* 20.0* 18.1*  ?CREATININE 1.06  --  0.88 0.67 0.70  --  0.73  ? ?  ?CrCl cannot be calculated (Unknown ideal weight.).   ? ?No Known Allergies ? ? ?Randall Larsen, PharmD, BCPS ?Clinical Staff Pharmacist ?[email or web address] ? ?Randall Larsen, [employer] ?06/30/2021 7:26 AM ? ?

## 2021-07-01 DIAGNOSIS — G8220 Paraplegia, unspecified: Secondary | ICD-10-CM | POA: Diagnosis not present

## 2021-07-01 DIAGNOSIS — N390 Urinary tract infection, site not specified: Secondary | ICD-10-CM | POA: Diagnosis not present

## 2021-07-01 DIAGNOSIS — A419 Sepsis, unspecified organism: Secondary | ICD-10-CM | POA: Diagnosis not present

## 2021-07-01 DIAGNOSIS — E440 Moderate protein-calorie malnutrition: Secondary | ICD-10-CM

## 2021-07-01 DIAGNOSIS — Z7189 Other specified counseling: Secondary | ICD-10-CM | POA: Diagnosis not present

## 2021-07-01 MED ORDER — FENTANYL 25 MCG/HR TD PT72
1.0000 | MEDICATED_PATCH | TRANSDERMAL | Status: DC
  Administered 2021-07-01 – 2021-07-04 (×2): 1 via TRANSDERMAL
  Filled 2021-07-01 (×2): qty 1

## 2021-07-01 NOTE — Progress Notes (Signed)
?PROGRESS NOTE ?Randall Larsen.  UXL:[id number] DOB: 10/10/1955 DOA: 06/20/2021 ?PCP: Hal Morales, DO  ? 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  He also had a Foley placed was meant to be permanent. He was supposed to start Zytiga and prednisone but ended up back in the hospital on 06/20/21.  Poor historian/ex-wife and daughter live in [place] and states that he they came to visit him last week and noticed an extreme foul smell coming from him, on visit 4 days prior they were told repeatedly that "he is getting better" but continued to notice the foul smell and therefore request that he be sent to the hospital. ? ?In the ED he was noted to have a temperature of 102.3, heart rate in the 100s, and extensive necrotic area on both of his buttocks, WBC count of 13.3 and was started on IV antibiotics.  General surgery was consulted and and debrided the area.  Wound postdebridement measured 12 x 13 x 6 cm. ?Rapid response was called  as he was "short of breath, anxious, febrile". CXR showed some pulm congestion but patient was not short of breath. ?Patient is being treated for sepsis with large necrotic foul-smelling sacral decubitus ulcer possible UTI in the setting of prostate cancer with mets to bone, cancer pain paraplegia with chronic Foley in place, anemia. ?Palliative care has been consulted for goals of care discussion due to complex nature of his medical illness.  ?  ?Subjective: ? ?IV line was prolonged the patient overnight refused to have new one in. ?Complains of pain this morning but refused  to take oral medication since he is fasting.  We will try pads. ?After family discussion planning for inpatient hospice waiting for bed ? ?Assessment and Plan: ?Principal Problem: ?  Sepsis due to undetermined organism [facility]) ?Active Problems: ?  Prostate cancer metastatic to bone [facility]) ?  Pain ?  Essential hypertension ?  Flaccid diplegia of lower extremities (HCC) ?  Normocytic anemia ?  Unstageable pressure ulcer of sacral region [facility]) ?  Goals of care, counseling/discussion ?  Metabolic acidosis ?  Malnutrition of moderate degree ? ?Sepsis with large necrotic foul-smelling sacral decubitus ulcer ?Possible UTI: ?Chronic Foley in place with large decubitus ulcer.  CT without evidence of osteomyelitis.Surgery on board s/p bedside debridement 3/15.Blood cultures negative so far.  Surgery has given option for debridement in the OR but family refused currently and on hydrotherapy surgery.WBC count remains elevated, despite being treated with broad-spectrum antibiotics- Vanco/Flagyl/Rocephin.  Vanco level high but was random after the Vanco infusion-after extensive family discussion admitting transitioning to comfort measures waiting for hospice bed, while here continue antibiotics if patient allows/agrees but lost IV access and does not want to take p.o. continue wound care as he allows but does not want it to be done today.  I will start fentanyl patch to control pain.  If she agrees for IV we can restart and put him back on IV antibiotics. Awaitiing hospice bed ?Given patient's functional debility, deconditioning, poor nutritional status nonambulatory status prostate cancer with mets surgery feels the bed sore will likley never heal. ? ?Prostate cancer with metastasis to  bone extensive cancer with uncontrolled pain in the back sacral area and paraplegic with chronic Foley in place- family does not want escalation of care.continue DNR continue to transition to hospice once bed available.  Start fentanyl  pathc for  pain sicne no iv access and refusing po, ? ?Anemia of chronic disease s/p 1 unit PRBC for 6.9 hemoglobin. Improved in 7-8.Monitor ?Recent Labs  ?Lab 06/25/21 ?8588 06/26/21 ?5027 06/27/21 ?7412 06/28/21 ?8786 06/29/21 ?0449  ?HGB 6.9* 8.5* 8.3* 7.9* 7.8*  ?HCT 22.3* 27.2* 26.7* 25.4* 25.4*  ? ?Pedal edema ?Paraplegia ?Chronic Foley ?Metabolic acidosis monitor bicarb level. ?Consulted dietitian to augment nutrition ? ?Goals of care counseling/discussion: ?After further extensive discussion and multiple family meeting insistence patient and family has agreed for comfort measures and awaiting for hospice bed.  Continue to treat what is treatable continue wound care if he allows.  At this time patient does not want to have new IV or wound care, if patient agrees will start fentanyl patch for pain control-I will send message to the palliative care team. ? ?DVT prophylaxis: enoxaparin (LOVENOX) injection 40 mg Start: 06/20/21 2200 ?SCDs Start: 06/20/21 1351 ?Code Status:   Code Status: DNR ?Family Communication: plan of care discussed with patient RN at bedside. ?Patient status is: inpatient Level of care: Telemetry  ?Remains inpatient because: Ongoing management of extensive wound waiting on hospice ?Patient currently not stable ?Dispo: The patient is from: Skilled nursing facility ?           Anticipated disposition: Hospice  ? ?Mobility Assessment (last 72 hours)   ? ? Mobility Assessment   ? ? [place] Name 06/30/21 2207 06/29/21 2234 06/28/21 1922  ?  ?  ? Does patient have an order for bedrest or is patient medically unstable No - Continue assessment No - Continue assessment No - Continue assessment    ? What is the highest level of mobility based on the progressive mobility assessment? Level 1 (Bedfast) - Unable to balance while sitting on edge of bed Level 1 (Bedfast) - Unable to balance while sitting on edge of bed Level 1 (Bedfast) - Unable to balance while sitting on edge of bed    ? Is the above level different  from baseline mobility prior to current illness? No - Consider discontinuing PT/OT No - Consider discontinuing PT/OT Yes - Recommend PT order    ? ?  ?  ? ?  ?  ? ?Objective: ?Vitals last 24 hrs: ?Vitals:  ? 06/30/21 1520 06/30/21 1522 06/30/21 2017 07/01/21 0424  ?BP: 129/75 129/75 (!) 144/78 (!) 142/73  ?Pulse: 86  91 96  ?Resp:   18 18  ?Temp:   98.3 ?F (36.8 ?C) 98.6 ?F (37 ?C)  ?TempSrc:   Oral Oral  ?SpO2:   93% 91%  ?Height:      ? ?Weight change:  ? ?Physical Examination: ?General exam: AA,older than stated age, weak appearing. ?HEENT:Oral mucosa moist, Ear/Nose WNL grossly, dentition normal. ?Respiratory system: bilaterally clear,no use of accessory muscle ?Cardiovascular system: S1 & S2 +, No JVD,. ?Gastrointestinal system: Abdomen soft,NT,ND, BS+ ?Nervous System:Alert, awake, moving extremities and grossly nonfocal ?Extremities: edema b/l LE,distal peripheral pulses palpable.  ?Skin: No rashes,no icterus. ?MSK: Normal muscle bulk,tone, power ?Extensive wound in the sacrum with dressing in place ? ?Medications reviewed:  ?Scheduled Meds: ? vitamin C  500 mg Oral BID  ? chlorhexidine  15 mL Mouth Rinse BID  ? Chlorhexidine Gluconate Cloth  6 each Topical Daily  ? docusate sodium  100 mg Oral BID  ? enoxaparin (LOVENOX) injection  40 mg Subcutaneous Q24H  ? feeding supplement  1 Container Oral TID BM  ? gabapentin  300 mg Oral BID  ? guaiFENesin  600 mg Oral Q12H  ? hydrALAZINE  50 mg Oral TID  ? lidocaine  1 patch Transdermal Q24H  ? mouth rinse  15 mL Mouth Rinse q12n4p  ? methadone  20 mg Oral Q8H  ? metoprolol tartrate  25 mg Oral BID  ? multivitamin with minerals  1 tablet Oral Daily  ? nutrition supplement (JUVEN)  1 packet Oral BID WC  ? pantoprazole  40 mg Oral BID  ? polyethylene glycol  17 g Oral BID  ? sodium hypochlorite   Irrigation BID  ? ?Continuous Infusions: ? cefTRIAXone (ROCEPHIN)  IV Stopped (06/30/21 1337)  ? metronidazole Stopped (07/01/21 7494)  ? vancomycin Stopped (07/01/21 0110)   ? ? ?Pressure Injury 06/21/21 Sacrum Medial Unstageable - Full thickness tissue loss in which the base of the injury is covered by slough (yellow, tan, gray, green or brown) and/or eschar (tan, brown or blac

## 2021-07-01 NOTE — Progress Notes (Signed)
Patient has fentanyl patch in place. Patient offered dinner, he refused but asked for some hot tea. Patient stated he has no appetite. Patient offered repositioned, he refused didn't now want to be repositioned again. Patient asked x3 if we could provide wound care but patient refused.  ?

## 2021-07-01 NOTE — Progress Notes (Incomplete)
Patient refused to have vancomycin trough drawn prior to ordered dose. Patient educated on the importance of the lab, still refuses. Pharmacy and MD notified.  ?

## 2021-07-01 NOTE — Progress Notes (Signed)
? ?Palliative Medicine Inpatient Follow Up Note ? ? ?HPI: 66 year old with metastatic prostate cancer to the bone with innumerable metastasis now paraplegic, sepsis and sacral decubitus wound, possible UTI and anemia. He has pulled his IV out and refused reinsertion. He also refused po pain medications as he states he is to be fasting for Ramadan. He is followed by our team. I was asked to see him today for pain management.  ? ?His family has ben involved in his care with a plan for inpatient hospice care when available. ? ?Today's Discussion I met with Randall Larsen who was in considerable pain 10/10 and tearful. Nurse Christa met with [place] in the room. She reports his urine output has been minimal and dark in color. We discussed encouraging po fluids as he is refusing an IV. ? ?Chart reviewed inclusive of vital signs, progress notes, laboratory results, and diagnostic images.  ? ?Created space and opportunity for patient to explore thoughts feelings and fears regarding current medical situation. He had been hesitant to take the oral pain medicine as his church leaders visited him yesterday evening and stressed Ramadan and the need to fast.   ? ?Questions and concerns addressed. We discussed church dispensation of fasting when a person is ill. He agreed to take the oral pain medicine and was agreeable to [place] starting a Fentanyl patch. He only wants to eat after sundown though. I discussed this  with his nurse. He took his oral medications.  ? ?Palliative Support Provided.  ? ?Objective Assessment: ?Vital Signs ?Vitals:  ? 06/30/21 2017 07/01/21 0424  ?BP: (!) 144/78 (!) 142/73  ?Pulse: 91 96  ?Resp: 18 18  ?Temp: 98.3 ?F (36.8 ?C) 98.6 ?F (37 ?C)  ?SpO2: 93% 91%  ? ? ?Intake/Output Summary (Last 24 hours) at 07/01/2021 1435 ?Last data filed at 07/01/2021 1200 ?Gross per 24 hour  ?Intake 120 ml  ?Output 250 ml  ?Net -130 ml  ? ? ? ?Gen:  NAD ?HEENT: moist mucous membranes ?CV: Regular rate and rhythm, no murmurs rubs or  gallops ?PULM: clear to auscultation bilaterally but diminished. No wheezes/rales/rhonchi ?ABD: soft/nontender/nondistended/normal bowel sounds ?EXT:  edema left ankle ?Neuro: Alert and oriented x 2 person and place, knows it is Ramadan ?Derm: dry skin, sacral dressing in place. ? ?SUMMARY OF RECOMMENDATIONS   ? ?Pain management: Oral hydromorphone, fentanyl patch- adjust dose based upon oral medication intake and pain assessment, lidoderm patch, methadone, gabapentin ?Dehydration: encourage oral intake, push fluids overnight with meals. Hala foods for religious beliefs ?Anxiety: lorazepam prn ?Nausea: ondansetron prn ?GI prophylaxis: pantoprazol ?Bowel regimen: colace, polyethylene glycol ?Wound management: silicon dressing changes by nurses, continue chronic use of foley to prevent would contamination    and comfort               ? ?Discharge planning: no chance for decubitus ulcer to heal, inpatient hospice when bed is available                                ?Time Spent: 25 minutes ? ?MDM -  ?Medical Decision Making: ?#/Complex Problems: 4                     ?Data Reviewed:    4             ?Management: 4 ?(1-Straightforward, 2-Low, 3-Moderate, 4-High) ?_________________________________________________________________________________Audrey Graylon Good, NP  ?[place] Team ?Team Cell Phone:  [phone] ?Please utilize secure chat with additional questions, if there is no response within 30 minutes please call the above phone number ? ?Palliative Medicine Team providers are available by phone from 7am to 7pm daily and can be reached through the team cell phone.  ?Should this patient require assistance outside of these hours, please call the patient's attending physician. ? ? ?  ?

## 2021-07-01 NOTE — Progress Notes (Signed)
Patient refused IV start, RN made aware. ?

## 2021-07-01 NOTE — Progress Notes (Signed)
Patient moaning in pain noted this am. Patient asked if he wanted pain medicine, he stated yes. When this writer returned to the room patient refused to take medicine orally. He stated he is fasting and will wait until sunset. He asked when sunset was. Patient was repositioned for comfort. This Probation officer asked patient if we could put in an IV site to give him pain medicine that route so he would be able to fast and get medicine. Patient refused IV placement.  ?

## 2021-07-02 DIAGNOSIS — I10 Essential (primary) hypertension: Secondary | ICD-10-CM

## 2021-07-02 DIAGNOSIS — E872 Acidosis, unspecified: Secondary | ICD-10-CM

## 2021-07-02 DIAGNOSIS — A419 Sepsis, unspecified organism: Secondary | ICD-10-CM | POA: Diagnosis not present

## 2021-07-02 MED ORDER — HYDROMORPHONE HCL 1 MG/ML PO LIQD
1.0000 mg | ORAL | Status: DC | PRN
  Administered 2021-07-03 (×2): 1 mg via ORAL
  Filled 2021-07-02 (×2): qty 1

## 2021-07-02 MED ORDER — LORAZEPAM 2 MG/ML PO CONC
0.5000 mg | Freq: Four times a day (QID) | ORAL | Status: DC | PRN
  Administered 2021-07-03 – 2021-07-04 (×2): 0.5 mg via ORAL
  Filled 2021-07-02 (×2): qty 1

## 2021-07-02 MED ORDER — LIDOCAINE-PRILOCAINE 2.5-2.5 % EX CREA
TOPICAL_CREAM | CUTANEOUS | Status: AC
  Filled 2021-07-02: qty 5

## 2021-07-02 MED ORDER — SCOPOLAMINE 1 MG/3DAYS TD PT72
1.0000 | MEDICATED_PATCH | TRANSDERMAL | Status: DC
  Administered 2021-07-02: 1.5 mg via TRANSDERMAL
  Filled 2021-07-02: qty 1

## 2021-07-02 NOTE — Progress Notes (Addendum)
? ?Palliative Medicine Inpatient Follow Up Note ? ? ?HPI: 66 year old with metastatic prostate cancer to the bone with innumerable metastasis now paraplegic, sepsis and sacral decubitus wound, possible UTI and anemia. He has pulled his IV out and repeatedly refused reinsertion. He is followed by our team.  ?  ?His family has ben involved in his care with a plan for inpatient hospice care when available. ? ?Today's Discussion July 02, 2021 ? ?Chart reviewed inclusive of vital signs, progress notes, laboratory results, and diagnostic images. Report from Nurse Daleen Snook. ? ?Created space and opportunity for patient to explore thoughts feelings and fears regarding current medical situation. Today we had a discussion on his current goals. Is he ready to give up and receive only comfort care. He says no, he wants to live.  We discussed his refusal to let an IV placed and refusal for medication. After a thoughtful discussion he agreed to let the IV team attempt an IV and he agreed to pain medicine which Daleen Snook provided. ? ?Questions and concerns addressed  ? ?Palliative Support Provided.  ? ?Objective Assessment: ?Vital Signs ?Vitals:  ? 07/01/21 1952 07/02/21 0316  ?BP: (!) 143/76 (!) 183/83  ?Pulse: 97 (!) 109  ?Resp: 16 16  ?Temp: 98.2 ?F (36.8 ?C) 98.9 ?F (37.2 ?C)  ?SpO2: 94% 95%  ? ? ?Intake/Output Summary (Last 24 hours) at 07/02/2021 0911 ?Last data filed at 07/01/2021 1640 ?Gross per 24 hour  ?Intake 60 ml  ?Output --  ?Net 60 ml  ? ? ? ?Gen:  NAD ?HEENT: moist mucous membranes ?CV: Regular rate and rhythm, no murmurs rubs or gallops ?PULM: decreased BS in bases, clear to auscultation bilaterally. No wheezes/rales/rhonchi. ?ABD: soft/nontender/nondistended/normal bowel sounds ?EXT:  edema of legs below knees and hands. ?Neuro: Alert and oriented x 2, knows it is Ramadan ? ?SUMMARY OF RECOMMENDATIONS   ?Pain: He verbalized  is willing to take oral pain medicine and willing to have IV reinsertion attempted. I inquired  about topical numbing medicine for IV insert.  ? ?Morphine equivalents  (ME) of pain medication: ?In the last 24 hours he has had  ?Fentanyl 25 mcg/hr =50 ME ?Methadone 40 mg=120 ME ?Hydromorphone 6 mg =24 ME ?Total 204 ME ? ?Pain management: Oral hydromorphone, fentanyl patch- adjust dose based upon oral medication intake and pain assessment, lidoderm patch, methadone, gabapentin. If he continues to refuse oral doses we could increase his Fentanyl patch dose.  ?Dehydration: encourage oral intake, push fluids overnight with meals. [place]ala foods for religious beliefs ?Anxiety: lorazepam prn ?Nausea: ondansetron prn ?GI prophylaxis: pantoprazol ?Bowel regimen: colace, polyethylene glycol ?Wound management: silicon dressing changes by nurses, continue chronic use of foley to prevent wound contamination    and comfort               ?  ?Time Spent:35 minutes ? ?MDM -  ? ?Medical Decision Making: ?#/Complex Problems: 4                     ?Data Reviewed:    4             ?Management: 4 ?(1-Straightforward, 2-Low, 3-Moderate, 4-High) ?___________________________________________________________ ? ?Lindell Spar, NP ?[place] Team ?Team Cell Phone: [phone] ?Please utilize secure chat with additional questions, if there is no response within 30 minutes please call the above phone number ? ?Palliative Medicine Team providers are available by phone from 7am to 7pm daily and can be reached through the team cell phone.  ?  Should this patient require assistance outside of these hours, please call the patient's attending physician. ? ? ?  ?

## 2021-07-02 NOTE — Progress Notes (Signed)
?PROGRESS NOTE ?Randall Larsen.  ZSW:[id number] DOB: [birth year] DOA: 06/20/2021 ?PCP: Hal Morales, DO  ? 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  He also had a Foley placed was meant to be permanent. He was supposed to start Zytiga and prednisone but ended up back in the hospital on 06/20/21.  Poor historian/ex-wife and daughter live in [place] and states that he they came to visit him last week and noticed an extreme foul smell coming from him, on visit 4 days prior they were told repeatedly that "he is getting better" but continued to notice the foul smell and therefore request that he be sent to the hospital. ? ?In the ED he was noted to have a temperature of 102.3, heart rate in the 100s, and extensive necrotic area on both of his buttocks, WBC count of 13.3 and was started on IV antibiotics.  General surgery was consulted and and debrided the area.  Wound postdebridement measured 12 x 13 x 6 cm. ?Rapid response was called  as he was "short of breath, anxious, febrile". CXR showed some pulm congestion but patient was not short of breath. ?Patient is being treated for sepsis with large necrotic foul-smelling sacral decubitus ulcer possible UTI in the setting of prostate cancer with mets to bone, cancer pain paraplegia with chronic Foley in place, anemia. ?Palliative care has been consulted for goals of care discussion due to complex nature of his medical illness.  ?3/24 night IV line was prolonged the patient overnight refused to have new one in.  ? ?Subjective: ?Seen and examined this morning.   Alert awake, resting comfortably.  Reports.  Stable on fentanyl patch.  After my discussion he agrees for IV line for now. ? ?Assessment and Plan: ?Principal Problem: ?  Sepsis due to undetermined organism [facility]) ?Active Problems: ?  Prostate cancer metastatic to bone [facility]) ?  Pain ?  Essential hypertension ?  Flaccid diplegia of lower extremities (HCC) ?  Normocytic anemia ?  Unstageable pressure ulcer of sacral region [facility]) ?  Goals of care, counseling/discussion ?  Metabolic acidosis ?  Malnutrition of moderate degree ? ?Sepsis with large necrotic foul-smelling sacral decubitus ulcer ?Possible UTI: ?Chronic Foley in place with large decubitus ulcer.  CT without evidence of osteomyelitis.Surgery on board s/p bedside debridement 3/15.Blood cultures negative so far.  Surgery has given option for debridement in the OR but family refused currently and on hydrotherapy surgery.WBC count remains elevated, despite being treated with broad-spectrum antibiotics- Vanco/Flagyl/Rocephin.  Vanco level high but was random after the Vanco infusion-after extensive family discussion admitting transitioning to comfort measures waiting for hospice bed, while here continue antibiotics if patient allows for IV access today he did agree after my discussion.  Continue fentanyl patch pain management outpatient palliative care on board. ?Given patient's functional debility, deconditioning, poor nutritional status nonambulatory status prostate cancer with mets surgery feels the bed sore will likley never heal. ? ?Prostate cancer with metastasis to bone extensive cancer with uncontrolled pain in the back sacral area and paraplegic with chronic Foley in place- family does not want escalation of care.continue DNR continue to transition to hospice once bed available.  Continue fentanyl  patch ? ?Anemia of chronic disease s/p 1 unit PRBC for 6.9 hemoglobin. Improved in 7-8. ?Recent Labs  ?Lab 06/26/21 ?6063 06/27/21 ?0160 06/28/21 ?1093  06/29/21 ?0449  ?HGB 8.5* 8.3* 7.9* 7.8*  ?HCT 27.2* 26.7* 25.4* 25.4*  ? ?Pedal edema ?Paraplegia ?Chronic Foley ?Metabolic acidosis monitor bicarb level. ?He is not eating much at this time.  Waiting for hospice bed.  n ? ?Goals of care counseling/discussion: ?After further extensive discussion and multiple family meeting insistence patient and family has agreed for comfort measures and awaiting for hospice bed.  Continue to treat what is treatable continue wound care if he allows.  He pulled his IV line out placed on fentanyl patch this morning agreeable for IV line while waiting for hospice bed.  Continue pain management continue supportive care.  ? ?DVT prophylaxis: enoxaparin (LOVENOX) injection 40 mg Start: 06/20/21 2200 ?SCDs Start: 06/20/21 1351 ?Code Status:   Code Status: DNR ?Family Communication: plan of care discussed with patient RN at bedside. ?Patient status is: inpatient Level of care: Telemetry  ?Remains inpatient because: Ongoing management of extensive wound waiting on hospice ?Patient currently not stable ?Dispo: The patient is from: Skilled nursing facility ?           Anticipated disposition: Hospice  ? ?Mobility Assessment (last 72 hours)   ? ? Mobility Assessment   ? ? [place] Name 07/01/21 2219 07/01/21 1617 06/30/21 2207 06/29/21 2234  ?  ? Does patient have an order for bedrest or is patient medically unstable No - Continue assessment No - Continue assessment No - Continue assessment No - Continue assessment   ? What is the highest level of mobility based on the progressive mobility assessment? Level 1 (Bedfast) - Unable to balance while sitting on edge of bed Level 1 (Bedfast) - Unable to balance while sitting on edge of bed Level 1 (Bedfast) - Unable to balance while sitting on edge of bed Level 1 (Bedfast) - Unable to balance while sitting on edge of bed   ? Is the above level different from baseline mobility prior to current illness? No - Consider discontinuing PT/OT No - Consider  discontinuing PT/OT No - Consider discontinuing PT/OT No - Consider discontinuing PT/OT   ? ?  ?  ? ?  ?  ? ?Objective: ?Vitals last 24 hrs: ?Vitals:  ? 07/01/21 0424 07/01/21 1557 07/01/21 1952 07/02/21 0316  ?BP: (!) 142/73 (!) 124/58 (!) 143/76 (!) 183/83  ?Pulse: 96 91 97 (!) 109  ?Resp: '18 20 16 16  '$ ?Temp: 98.6 ?F (37 ?C) 97.9 ?F (36.6 ?C) 98.2 ?F (36.8 ?C) 98.9 ?F (37.2 ?C)  ?TempSrc: Oral Oral Oral Oral  ?SpO2: 91% 93% 94% 95%  ?Height:      ? ?Weight change:  ? ?Physical Examination: ?General exam: AA oriented, ill looking, frail,older than stated age, weak appearing. ?HEENT:Oral mucosa moist, Ear/Nose WNL grossly, dentition normal. ?Respiratory system: bilaterally diminished,no use of accessory muscle ?Cardiovascular system: S1 & S2 +, No JVD,. ?Gastrointestinal system: Abdomen soft,NT,ND, BS+ ?Nervous System:Alert, awake, moving extremities and grossly nonfocal ?Extremities: edema in bilateral lower extremities,distal peripheral pulses palpable.  ?Skin: No rashes,no icterus. ?MSK: Normal muscle bulk,tone, power ?Sacral wound with dressing in place ? ?Medications reviewed:  ?Scheduled Meds: ? vitamin C  500 mg Oral BID  ? chlorhexidine  15 mL Mouth Rinse BID  ? Chlorhexidine Gluconate Cloth  6 each Topical Daily  ? docusate sodium  100 mg Oral BID  ? enoxaparin (LOVENOX) injection  40 mg Subcutaneous Q24H  ? feeding supplement  1 Container Oral TID BM  ? fentaNYL  1 patch Transdermal Q72H  ? gabapentin  300 mg Oral BID  ? guaiFENesin  600 mg Oral Q12H  ? hydrALAZINE  50 mg Oral TID  ? lidocaine  1 patch Transdermal Q24H  ? lidocaine-prilocaine   Topical STAT  ? mouth rinse  15 mL Mouth Rinse q12n4p  ? methadone  20 mg Oral Q8H  ? metoprolol tartrate  25 mg Oral BID  ? multivitamin with minerals  1 tablet Oral Daily  ? nutrition supplement (JUVEN)  1 packet Oral BID WC  ? pantoprazole  40 mg Oral BID  ? polyethylene glycol  17 g Oral BID  ? sodium hypochlorite   Irrigation BID  ? ?Continuous Infusions: ?  cefTRIAXone (ROCEPHIN)  IV Stopped (06/30/21 1337)  ? metronidazole Stopped (07/01/21 3143)  ? vancomycin Stopped (07/01/21 0110)  ? ? ?Pressure Injury 06/21/21 Sacrum Medial Unstageable - Full thickness tissue loss in

## 2021-07-02 NOTE — Progress Notes (Signed)
Refusing PIV insertion at this time, RN aware. ?

## 2021-07-02 NOTE — Progress Notes (Signed)
Emla cream given to patient for IV placement.  ?

## 2021-07-02 NOTE — Progress Notes (Signed)
? ?Palliative Medicine Inpatient Follow Up Note ? ?Progress note: Asked to see again today for pain  management following unsuccessful IV insertion. ? ?HPI: 66 year old with metastatic prostate cancer to the bone with innumerable metastasis now paraplegic, sepsis and sacral decubitus wound, possible UTI and anemia. He has pulled his IV out and repeatedly refused reinsertion until today. EMLA cream was used but IV was unsuccessful. He refused re-attempt.  He is followed by our team.  ?  ?His family has ben involved in his care with a plan for inpatient hospice care when available. ?  ?Today's Discussion July 02, 2021 afternoon ? ?Chart reviewed inclusive of vital signs, progress notes, laboratory results, and diagnostic images.  ? ?Created space and opportunity for patient and family to explore thoughts feelings and fears regarding current medical situation. Nurse Christa was present for conversation. We discussed current situation. He has refused IV reinsertion. He is "done". He has not been eating and only drinking with oral medications which he often refuses. He is agreeable to comfort measures only, allow a natural death and stop aggressive treatment. We reviewed what care will be continued to keep him comfortable. He pain is under control at the moment. He has no urine output.  ? ?His daughter Dan Europe and ex-wife arrived. Patient was agreeable to me sharing our conversation. He validated it with hs family. Family is supportive of his decision. ? ?Provided  [place] "Hard Choices for [employer]" booklet.  ? ?Provided [place] "Gone From My Site" booklet. ? ?Questions and concerns addressed.  ? ?Palliative Support Provided.  ? ?Objective Assessment: ?Vital Signs ?Vitals:  ? 07/02/21 0316 07/02/21 1239  ?BP: (!) 183/83 (!) 164/81  ?Pulse: (!) 109 79  ?Resp: 16 16  ?Temp: 98.9 ?F (37.2 ?C) 98.2 ?F (36.8 ?C)  ?SpO2: 95% 99%  ? ? ?Intake/Output Summary (Last 24 hours) at 07/02/2021 1653 ?Last data filed at 07/02/2021  1523 ?Gross per 24 hour  ?Intake 60 ml  ?Output --  ?Net 60 ml  ? ? ? ?Gen:  NAD ?HEENT: moist mucous membranes, oral secretions that he has trouble clearing ?CV: Regular rate and rhythm, no murmurs rubs or gallops ?PULM: decreased in bases, No wheezes/rales/rhonchi ?ABD: soft/nontender/nondistended/normal bowel sounds ?EXT:  edema of legs below knees and hands. ?Neuro: Alert and oriented x2, knows it is Ramadan ? ?SUMMARY OF RECOMMENDATIONS   ?Comfort measures only ?Pain management: Liquid hydromorphone sublingual 1 mg every 2 hours as needed for severe pain, fentanyl patch- adjust dose based upon oral medication intake and pain assessment, lidoderm patch, methadone, gabapentin. If he  refuses oral doses we could increase his Fentanyl patch dose.  ?[employer] for religious beliefs ?Anxiety: lorazepam liquid 0.5 mg every  6 hours  as needed ?Breathlessness: oxygen as needed, opioids also have an effect on breathing centers of the brain ?Nausea: ondansetron prn ?GI prophylaxis: pantoprazole at night ?Bowel regimen: colace, polyethylene glycol ?Wound management: silicon dressing changes by nurses, continue chronic use of foley to prevent wound contamination  and comfort        ?        ?Time Spent: 1 hour ?Greater than 50% was communication and management of patient needs.  Plan was communicated to patient care team.  ? ?MDM -  ?Medical Decision Making: ?#/Complex Problems: 4                     ?Data Reviewed:    4             ?  Management: 4 ?(1-Straightforward, 2-Low, 3-Moderate, 4-High) ?____________________________________________________________ ?Lindell Spar, NP ?[place] Team ?Team Cell Phone: [phone] ?Please utilize secure chat with additional questions, if there is no response within 30 minutes please call the above phone number ? ?Palliative Medicine Team providers are available by phone from 7am to 7pm daily and can be reached through the team cell phone.  ?Should this patient  require assistance outside of these hours, please call the patient's attending physician. ? ? ?  ?

## 2021-07-02 NOTE — Progress Notes (Signed)
FYI, patient this morning asked this writer if I thought he'd be able to walk again. 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 This nurse reoriented patient to his infection and wound.  ?

## 2021-07-02 NOTE — Progress Notes (Signed)
Patient c/o pain even though applied EMLA cream. Unable to put in the PIV access. Informed patient's RN and EMLA cream was not help much control the pain. HS Truman Hayward RN ?

## 2021-07-02 NOTE — Progress Notes (Signed)
Patient refusing iv insertion, multiple attempts made but he continue to refused. Patient educated. Iv meds not given d/t no iv line. On-call notified. We continue to monitor. ?

## 2021-07-02 NOTE — TOC Progression Note (Addendum)
Transition of Care (TOC) - Progression Note  ? ? ?Patient Details  ?Name: Randall Larsen. ?MRN: [id number] ?Date of Birth: Dec 19, 1955 ? ?Transition of Care (TOC) CM/SW Contact  ?Ross Ludwig, LCSW ?Phone Number: ?07/02/2021, 5:02 PM ? ?Clinical Narrative:    ? ?CSW was informed by palliative that patient is being switched to comfort care.  CSW spoke to patient's daughter Dan Europe, family have decided they would like hospice facility and would like [facility] if possible.  CSW contacted Melissa at [facility] and they will review patient and contact family tomorrow per family request. ? ?CSW explained to patient's daughter that he is LTC at [facility] in [place], if patient does not meet criteria for [facility] he would have to return to the facility where he is already LTC.  She said they don't want him to return back there, CSW explained that if that is the only bed option he has for LTC, then he will have to go there, because he can not just stay in the hospital.  CSW also informed her that there are usually not many LTC beds available at other facilities.  The only other option would be going home with home hospice.  Patient's daughter expressed understanding. ? ?Expected Discharge Plan and Services ?  ?  ?  ?  ?  ?                ?  ?  ?  ?  ?  ?  ?  ?  ?  ?  ? ? ?Social Determinants of Health (SDOH) Interventions ?  ? ?Readmission Risk Interventions ?   ? View : No data to display.  ?  ?  ?  ? ? ?

## 2021-07-02 NOTE — Progress Notes (Addendum)
Unable to achieve IV site for patient with emla cream and emotional support. Patient's pain with oral pain medication is 10 out of 10 pain during IV insertion. Patient will not be able to tolerate another IV insertion. MD made aware.  ?

## 2021-07-03 DIAGNOSIS — Z7189 Other specified counseling: Secondary | ICD-10-CM | POA: Diagnosis not present

## 2021-07-03 DIAGNOSIS — R0603 Acute respiratory distress: Secondary | ICD-10-CM | POA: Diagnosis not present

## 2021-07-03 DIAGNOSIS — R52 Pain, unspecified: Secondary | ICD-10-CM | POA: Diagnosis not present

## 2021-07-03 DIAGNOSIS — A419 Sepsis, unspecified organism: Secondary | ICD-10-CM | POA: Diagnosis not present

## 2021-07-03 MED ORDER — GLYCOPYRROLATE 0.2 MG/ML IJ SOLN
0.3000 mg | INTRAMUSCULAR | Status: DC | PRN
  Filled 2021-07-03: qty 2

## 2021-07-03 MED ORDER — HYDROMORPHONE HCL 1 MG/ML PO LIQD
1.0000 mg | ORAL | Status: DC | PRN
  Administered 2021-07-04 (×3): 1 mg via ORAL
  Filled 2021-07-03 (×3): qty 1

## 2021-07-03 MED ORDER — ATROPINE SULFATE 1 % OP SOLN
1.0000 [drp] | OPHTHALMIC | Status: DC | PRN
  Administered 2021-07-03 (×2): 2 [drp] via SUBLINGUAL
  Administered 2021-07-04: 1 [drp] via SUBLINGUAL
  Administered 2021-07-04 (×3): 2 [drp] via SUBLINGUAL
  Filled 2021-07-03: qty 2

## 2021-07-03 NOTE — Progress Notes (Signed)
?PROGRESS NOTE ?Randall Larsen.  WHQ:[id number] DOB: 16-Apr-1955 DOA: 06/20/2021 ?PCP: Hal Morales, DO  ? 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  He also had a Foley placed was meant to be permanent. He was supposed to start Zytiga and prednisone but ended up back in the hospital on 06/20/21.  Poor historian/ex-wife and daughter live in [place] and states that he they came to visit him last week and noticed an extreme foul smell coming from him, on visit 4 days prior they were told repeatedly that "he is getting better" but continued to notice the foul smell and therefore request that he be sent to the hospital. ? ?In the ED he was noted to have a temperature of 102.3, heart rate in the 100s, and extensive necrotic area on both of his buttocks, WBC count of 13.3 and was started on IV antibiotics.  General surgery was consulted and and debrided the area.  Wound postdebridement measured 12 x 13 x 6 cm. ?Rapid response was called  as he was "short of breath, anxious, febrile". CXR showed some pulm congestion but patient was not short of breath. ?Patient is being treated for sepsis with large necrotic foul-smelling sacral decubitus ulcer possible UTI in the setting of prostate cancer with mets to bone, cancer pain paraplegia with chronic Foley in place, anemia. ?Palliative care has been consulted for goals of care discussion due to complex nature of his medical illness.  ?3/24 night IV line was prolonged the patient overnight refused to have new one in.  ? ?Subjective: ?Seen and examined.  Somewhat  lethargic today and out of confusion. ? ?Assessment and Plan: ?Principal Problem: ?  Sepsis due to undetermined organism [facility]) ?Active Problems: ?  Prostate cancer metastatic to bone [facility]) ?  Pain ?  Essential hypertension ?  Flaccid diplegia of lower extremities (HCC) ?  Normocytic anemia ?  Unstageable pressure ulcer of sacral region [facility]) ?  Goals of care, counseling/discussion ?  Metabolic acidosis ?  Malnutrition of moderate degree ? ?Comfort measures/end-of-life care: ?After further discussion with patient and family members patient transition to comfort measures, he is waiting for hospice bed at [facility]. ?Continue liquid Dilaudid sublingual, fentanyl patch, Lidoderm patch, methadone, Neurontin.  Can increase fentanyl patch if he uses p.o.  Continue Ativan for anxiety, oxygen/opiates for shortness of breath, and Zofran PPI and stool softener. ? ?Sepsis with large necrotic foul-smelling sacral decubitus ulcer ?Possible UTI ?Chronic Foley in place with large decubitus ulcer: ?CT without evidence of osteomyelitis.Surgery on board s/p bedside debridement 3/15.Blood cultures negative so far.  Surgery has given option for debridement in the OR but family refused. Given patient's functional debility, deconditioning, poor nutritional status nonambulatory status prostate cancer with mets surgery feels the bed sore will likley never heal. IV antibiotics has been discontinued, patient does not want IV stick reattempt.  Now on comfort measures, cont dressing change as he allows.  ? ?Prostate cancer with metastasis to bone extensive cancer with uncontrolled pain: ?Pain management as above.  Foley in place.  ? ?Anemia of chronic disease s/p 1 unit PRBC for 6.9 hemoglobin. Improved in 7-8. ? ?Pedal edema ?  Paraplegia ?Chronic Foley ?Metabolic acidosis monitor bicarb level. ?He is not eating much at this time.  Waiting for hospice bed.  ?    ?DVT prophylaxis:   None for comfort ?Code Status:   Code Status: DNR ?Family  Communication: plan of care discussed with patient RN at bedside. ?Patient status is: inpatient Level of care: Telemetry  ?Remains inpatient because: Ongoing management of extensive wound waiting on hospice ?Patient currently stable ?Dispo: The patient is from: Skilled nursing facility ?           Anticipated disposition: Inpatient hospice  ? ?Mobility Assessment (last 72 hours)   ? ? Mobility Assessment   ? ? [place] Name 07/02/21 1939 07/02/21 1239 07/01/21 2219 07/01/21 1617 06/30/21 2207  ? Does patient have an order for bedrest or is patient medically unstable No - Continue assessment No - Continue assessment No - Continue assessment No - Continue assessment No - Continue assessment  ? What is the highest level of mobility based on the progressive mobility assessment? Level 1 (Bedfast) - Unable to balance while sitting on edge of bed Level 1 (Bedfast) - Unable to balance while sitting on edge of bed Level 1 (Bedfast) - Unable to balance while sitting on edge of bed Level 1 (Bedfast) - Unable to balance while sitting on edge of bed Level 1 (Bedfast) - Unable to balance while sitting on edge of bed  ? Is the above level different from baseline mobility prior to current illness? No - Consider discontinuing PT/OT No - Consider discontinuing PT/OT No - Consider discontinuing PT/OT No - Consider discontinuing PT/OT No - Consider discontinuing PT/OT  ? ?  ?  ? ?  ?  ? ?Objective: ?Vitals last 24 hrs: ?Vitals:  ? 07/01/21 1952 07/02/21 0316 07/02/21 1239 07/02/21 2026  ?BP: (!) 143/76 (!) 183/83 (!) 164/81 (!) 143/73  ?Pulse: 97 (!) 109 79 94  ?Resp: '16 16 16 16  '$ ?Temp: 98.2 ?F (36.8 ?C) 98.9 ?F (37.2 ?C) 98.2 ?F (36.8 ?C) 98.1 ?F (36.7 ?C)  ?TempSrc: Oral Oral Axillary Oral  ?SpO2: 94% 95% 99% 100%  ?Height:      ? ?Weight change:  ? ?Physical Examination: ?General exam: Lethargic repeating questions, older than stated age, weak appearing. ?HEENT:Oral mucosa moist, Ear/Nose WNL grossly, dentition normal. ?Respiratory  system: bilaterally diminished, no use of accessory muscle ?Cardiovascular system: S1 & S2 +, No JVD,. ?Gastrointestinal system: Abdomen soft,NT,ND,BS+ ?Nervous System:Alert, awake, moving extremities and grossly nonfocal ?Extremities: LE ankle edema ++, distal peripheral pulses palpable.  ?Skin: No rashes,no icterus. ?MSK: Normal muscle bulk,tone, power  ?Sacral ulcer with dressing in place ? ?Medications reviewed:  ?Scheduled Meds: ? vitamin C  500 mg Oral BID  ? docusate sodium  100 mg Oral BID  ? feeding supplement  1 Container Oral TID BM  ? fentaNYL  1 patch Transdermal Q72H  ? gabapentin  300 mg Oral BID  ? guaiFENesin  600 mg Oral Q12H  ? hydrALAZINE  50 mg Oral TID  ? lidocaine  1 patch Transdermal Q24H  ? mouth rinse  15 mL Mouth Rinse q12n4p  ? methadone  20 mg Oral Q8H  ? metoprolol tartrate  25 mg Oral BID  ? nutrition supplement (JUVEN)  1 packet Oral BID WC  ? pantoprazole  40 mg Oral BID  ? polyethylene glycol  17 g Oral BID  ? scopolamine  1 patch Transdermal Q72H  ? sodium hypochlorite   Irrigation BID  ? ?Continuous Infusions: ? ? ? ?  Pressure Injury 06/21/21 Sacrum Medial Unstageable - Full thickness tissue loss in which the base of the injury is covered by slough (yellow, tan, gray, green or brown) and/or eschar (tan, brown or black) in the wound bed. (Active)  ?06/21/21 0800  ?Location: Sacrum  ?Location Orientation: Medial  ?Staging: Unstageable - Full thickness tissue loss in which the base of the injury is covered by slough (yellow, tan, gray, green or brown) and/or eschar (tan, brown or black) in the wound bed.  ?Wound Description (Comments):   ?Present on Admission: Yes  ?Dressing Type Gauze (Comment);ABD 07/01/21 2219  ? ?Diet Order   ? ?       ?  Diet regular Room service appropriate? Yes; Fluid consistency: Thin  Diet effective now       ?  ? ?  ?  ? ?  ?Net IO Since Admission: -1,315.44 mL [07/03/21 1312]  ?Wt Readings from Last 3 Encounters:  ?06/01/21 103.1 kg  ?05/31/21 103.1 kg   ?05/30/21 103.1 kg  ?  ? ?Unresulted Labs (From admission, onward)  ? ? None  ? ?  ?Data Reviewed: I have personally reviewed following labs and imaging studies ?CBC: ?Recent Labs  ?Lab June 28, 2021 ?0998 06/28/21 ?063

## 2021-07-03 NOTE — Progress Notes (Signed)
Patient IO:MBTDHR Randall Larsen.      DOB: 1955-07-27      CBU:[id number] ? ? ? ?  ?Palliative Medicine Team ? ? ? ?Subjective: Bedside symptom check. No family or friends present at time of visit.  ? ? ?Physical exam: Patient comfortably resting in bed at time of visit. No physical or nonverbal signs of pain, discomfort, or distress noted. No excessive secretions noted. Breathing even and non-labored with cannula in place. Patient does not easily arouse to light touch, verbal cues. This RN did further attempt to stimulate patient at this time.  ? ? ?Assessment and plan: Per bedside RN, patient unable to tolerate PO medications with applesauce today. She has held most of the medications on eMAR that are not sublingual. NP notified of the change of route needed. Patient does not have IV access at this time. At this time no beds available at residential hospice home. Will continue to follow for changes or advances.  ? ? ?Thank you for allowing the Palliative Medicine Team to assist in the care of this patient. ?  ?  ?Damian Leavell, MSN, RN ?Palliative Medicine Team ?Team Phone: [phone]  ?This phone is monitored 7a-7p, please reach out to attending physician outside of these hours for urgent needs.   ?

## 2021-07-03 NOTE — Progress Notes (Signed)
Patient refuses dressing, multiple attempts were made but he kept refusing. He also refused repositioning. We continue to monitor. ?

## 2021-07-03 NOTE — Progress Notes (Addendum)
? ? ? ?  Mr. Dohrmann resting comfortably. No acute distress noted. Continues to have poor oral intake and refusing oral medications. He does not have IV access due to unsuccessful previous attempts and patient refusing any further attempts.  ? ?Over the past 24hrs he has not taken any of his oral medications. He is tolerating liquid hydromorphone for pain.  ? ?No family at the bedside. I spoke with [place] and her mother via phone. They are in town and planning to meet with [facility] later today. They also toured [facility] this morning. Family has requested follow-up discussions tomorrow. I will contact family in the morning as requested to schedule meeting time.  ? ?All questions answered and support provided.  ? ?Assessment ?-frail, ill-appearing ?-diminished bilaterally  ?-bilateral lower extremity edema ?-alert to self only ? ?Plan  ?-Comfort focused care ?-Family in ongoing discussions regarding disposition Dance movement psychotherapist). They have toured facility and have a scheduled meeting with [facility] this afternoon.  ?-Pain management: Oral hydromorphone increase to 1-'2mg'$ , fentanyl patch- adjust dose based upon oral medication intake and pain assessment,-increased to 23mg, lidoderm patch, methadone, gabapentin. If he continues to refuse oral doses we could increase his Fentanyl patch dose.  ?Dehydration: encourage oral intake, push fluids overnight with meals. Hala foods for religious beliefs ?Anxiety: lorazepam prn ?Nausea: ondansetron prn ?GI prophylaxis: pantoprazol ?Bowel regimen: colace, polyethylene glycol ?Wound management: silicon dressing changes by nurses, continue chronic use of foley to prevent wound contamination    and comfort               ? ? ?Time Total: 35 min.  ? ?Visit consisted of counseling and education dealing with the complex and emotionally intense issues of symptom management and palliative care in the setting of serious and potentially life-threatening illness.Greater than 50%  of this  time was spent counseling and coordinating care related to the above assessment and plan. ? ?NAlda Lea AGPCNP-BC  ?[place]? ? ?

## 2021-07-03 NOTE — Progress Notes (Signed)
Manufacturing engineer [facility]) Hospital Liaison note.   ? ?Received request from [place] for family interest in [facility]. Spoke with family to confirm interest and answer questions. [facility] is unable to offer a room today. Hospital Liaison will follow up tomorrow or sooner if a room becomes available and eligibility is confirmed.  ? ?Please do not hesitate to call with questions.   ? ?Thank you,   ?Farrel Gordon, RN, CCM      ?[facility] Hospital Liaison   ?[phone] ?

## 2021-07-04 DIAGNOSIS — A419 Sepsis, unspecified organism: Secondary | ICD-10-CM | POA: Diagnosis not present

## 2021-07-04 LAB — RESP PANEL BY RT-PCR (FLU A&B, COVID) ARPGX2
Influenza A by PCR: NEGATIVE
Influenza B by PCR: NEGATIVE
SARS Coronavirus 2 by RT PCR: NEGATIVE

## 2021-07-04 MED ORDER — FENTANYL 25 MCG/HR TD PT72: 1.0000 | MEDICATED_PATCH | TRANSDERMAL | 0 refills | Status: AC

## 2021-07-04 MED ORDER — HYDROMORPHONE HCL 1 MG/ML PO LIQD: 1.0000 mg | ORAL | 0 refills | Status: AC | PRN

## 2021-07-04 MED ORDER — DAKINS (1/4 STRENGTH) 0.125 % EX SOLN: Freq: Two times a day (BID) | CUTANEOUS | 0 refills | Status: AC

## 2021-07-04 MED ORDER — SCOPOLAMINE 1 MG/3DAYS TD PT72: 1.0000 | MEDICATED_PATCH | TRANSDERMAL | 12 refills | Status: AC

## 2021-07-04 MED ORDER — ATROPINE SULFATE 1 % OP SOLN: 1.0000 [drp] | OPHTHALMIC | 12 refills | Status: AC | PRN

## 2021-07-04 MED ORDER — LORAZEPAM 2 MG/ML PO CONC: 0.5000 mg | Freq: Four times a day (QID) | ORAL | 0 refills | Status: AC | PRN

## 2021-07-04 MED ORDER — GLYCOPYRROLATE 0.2 MG/ML IJ SOLN: 0.3000 mg | INTRAMUSCULAR | Status: AC | PRN

## 2021-07-04 NOTE — TOC Progression Note (Addendum)
Transition of Care (TOC) - Progression Note  ? ? ?Patient Details  ?Name: Endi Lagman. ?MRN: [id number] ?Date of Birth: [birth year] ? ?Transition of Care (TOC) CM/SW Contact  ?Ross Ludwig, LCSW ?Phone Number: ?07/04/2021, 10:19 AM ? ?Clinical Narrative:    ? ?CSW spoke to [employer], [place].  Per Olivia Mackie, family are discussing the final decision about [facility] and will let her know.  CSW was informed that a bed is available today if they are agreeable.  CSW awaiting for decision from family.  CSW to continue to follow patient's progress throughout discharge planning. ? ?11:35am  CSW was informed that family have agreed to the bed at [facility].  Per [facility] rep, they can accept patient today, she will work on getting the consents completed. ? ?  ?  ? ?Expected Discharge Plan and Services ? [employer] facility ?  ?  ?  ?  ?                ?  ?  ?  ?  ?  ?  ?  ?  ?  ?  ? ? ?Social Determinants of Health (SDOH) Interventions ?  ? ?Readmission Risk Interventions ?   ? View : No data to display.  ?  ?  ?  ? ? ?

## 2021-07-04 NOTE — Progress Notes (Addendum)
[facility]) Hospital Liaison Note ? ?Hospice eligibility confirmed. [facility] has offered a bed to the family today. Randall Larsen has been notified and needs some time to process. She will call me back within the hour. ? ?1110: Family has accepted the bed. Awaiting completion of consents. Once completed, we can arrange for transport. ? ?Please send signed and completed DNR with patient. ? ?RN please call report to [phone]. ? ?Please call with any hospice related questions or concerns. ? ?Thank you, ?Margaretmary Eddy, BSN, RN ?[facility] Liaison ?[phone] ?

## 2021-07-04 NOTE — Progress Notes (Addendum)
Chaplain spoke with patient's beside nurse regarding family and how Randall Larsen was doing. Randall Larsen was resting and no family was present.  Chaplain spent a few moments with Randall Larsen in the room, offering comforting presence. ? ? ?[place], Bcc ?Pager, [phone] ?10:24 AM ? ?

## 2021-07-04 NOTE — Progress Notes (Signed)
I called [facility] and attempted to give report, I was transferred to a voicemail. Left my name and the [place] phone number to call back for report.  ?

## 2021-07-04 NOTE — Discharge Summary (Signed)
Physician Discharge Summary  ?Randall Pauls. LPF:[id number] DOB: 1955-11-11 DOA: 06/20/2021 ? ?PCP: Hal Morales, DO ? ?Admit date: 06/20/2021 ?Discharge date: 07/04/2021 ?Recommendations for Outpatient Follow-up:  ?Follow up with inpatient hospice upon discharge ? ?Discharge Dispo: Inpatient hospice ?Discharge Condition: Stable ?Code Status:   Code Status: DNR ?Diet recommendation:  ?Diet Order   ? ?       ?  Diet regular Room service appropriate? Yes; Fluid consistency: Thin  Diet effective now       ?  ? ?  ?  ? ?  ?  ? 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  He also had a Foley placed was meant to be permanent. He was supposed to start Zytiga and prednisone but ended up back in the hospital on 06/20/21.  Poor historian/ex-wife and daughter live in [place] and states that he they came to visit him last week and noticed an extreme foul smell coming from him, on visit 4 days prior they were told repeatedly that "he is getting better" but continued to notice the foul smell and therefore request that he be sent to the hospital. ? ?In the ED he was noted to have a temperature of 102.3, heart rate in the 100s, and extensive necrotic area on both of his buttocks, WBC count of 13.3 and was started on IV antibiotics.  General surgery was consulted and and debrided the area.  Wound postdebridement measured 12 x 13 x 6 cm. ?Rapid response was called  as he was "short of breath, anxious, febrile". CXR showed some pulm congestion but patient was not short of breath. ?Patient is being treated for sepsis with large  necrotic foul-smelling sacral decubitus ulcer possible UTI in the setting of prostate cancer with mets to bone, cancer pain paraplegia with chronic Foley in place, anemia. ?Palliative care has been consulted for goals of care discussion due to complex nature of his medical illness. ?3/24 night IV line was prolonged the patient overnight refused to have new one in. multiple attempts for IV access difficult placement but patient subsequently refused placement further multiple meetings with palliative care and subsequently patient was transitioned to comfort measures.  Referred to [facility].  [facility] is being discharged [facility] once family complete the paperwork as they have a bed today  ? ?Discharge Diagnoses:  ?Principal Problem: ?  Sepsis due to undetermined organism [facility]) ?Active Problems: ?  Prostate cancer metastatic to bone [facility]) ?  Pain ?  Essential hypertension ?  Flaccid diplegia of lower extremities (HCC) ?  Normocytic anemia ?  Unstageable pressure ulcer of sacral region [facility]) ?  Goals of care, counseling/discussion ?  Metabolic acidosis ?  Malnutrition of moderate degree ? ?Comfort measures/end-of-life care: ?Patient is being transitioned to [facility] for ongoing end-of-life care with multiple medications  ?  ?Sepsis with large necrotic foul-smelling sacral decubitus ulcer ?Possible UTI ?Chronic Foley in place with large decubitus ulcer: ?CT without evidence of osteomyelitis.Surgery on board s/p bedside debridement 3/15.Blood cultures negative so far.  Surgery has given option for debridement in the OR but family refused. Given patient's functional debility, deconditioning, poor nutritional status nonambulatory  status prostate cancer with mets surgery feels the bed sore will likley never heal. IV antibiotics has been discontinued, patient does not want IV stick reattempt.  Now on comfort measures, cont dressing change ?  ?Prostate cancer with metastasis to bone extensive cancer with  uncontrolled pain: ?Pain management as above.  Foley in place.  ?  ?Anemia of chronic disease s/p 1 unit PRBC for 6.9 hemoglobin. Improved in 7-8. ?  ?Pedal edema ?Paraplegia ?Chronic Foley ?Metabolic acidosis monitor bicarb level. ?He is not eating much at this time.  Waiting for hospice bed.  ?Pressure Ulcer: ?Pressure Injury 06/21/21 Sacrum Medial Unstageable - Full thickness tissue loss in which the base of the injury is covered by slough (yellow, tan, gray, green or brown) and/or eschar (tan, brown or black) in the wound bed. (Active)  ?06/21/21 0800  ?Location: Sacrum  ?Location Orientation: Medial  ?Staging: Unstageable - Full thickness tissue loss in which the base of the injury is covered by slough (yellow, tan, gray, green or brown) and/or eschar (tan, brown or black) in the wound bed.  ?Wound Description (Comments):   ?Present on Admission: Yes  ?Dressing Type Dakin's-soaked gauze 07/03/21 2000  ? ? ?Consults: ?CCS, PMT ? ?Subjective: ?Ill looking frail mildly lethargic able to tell me his name but less responsive ?Able to have mild cough ?Discharge Exam: ?Vitals:  ? 07/04/21 0100 07/04/21 0200  ?BP:    ?Pulse:    ?Resp: (!) 25 18  ?Temp:    ?SpO2:    ? ?General: Pt is alert, awake, not in acute distress ?Cardiovascular: RRR, S1/S2 +, no rubs, no gallops ?Respiratory: CTA bilaterally, no wheezing, no rhonchi ?Abdominal: Soft, NT, ND, bowel sounds + ?Extremities: no edema, no cyanosis ? ?Discharge Instructions ? ?Discharge Instructions   ? ? Discharge instructions   Complete by: As directed ?  ? Follow-up with inpatient hospice facility today upon discharge  ? Discharge wound care:   Complete by: As directed ?  ? Cleanse with NS, pat gently dry. Fill defects with saline moistened roll gauze, top with dry gauze, ABD pads and secure with tape. Change PRN for soiling, otherwise twice daily  ? ?  ? ?Allergies as of 07/04/2021   ?No Known Allergies ?  ? ?  ?Medication List  ?  ? ?STOP taking these medications    ? ?abiraterone acetate 250 MG tablet ?Commonly known as: ZYTIGA ?  ?acetaminophen 500 MG tablet ?Commonly known as: TYLENOL ?  ?celecoxib 200 MG capsule ?Commonly known as: CELEBREX ?  ?docusate sodium 100 MG capsule ?Commonly known as: COLACE ?  ?furosemide 40 MG tablet ?Commonly known as: Lasix ?  ?gabapentin 300 MG capsule ?Commonly known as: NEURONTIN ?  ?guaiFENesin 600 MG 12 hr tablet ?Commonly known as: [place] ?  ?hydrALAZINE 50 MG tablet ?Commonly known as: APRESOLINE ?  ?HYDROmorphone 4 MG tablet ?Commonly known as: DILAUDID ?Replaced by: HYDROmorphone HCl 1 MG/ML Liqd ?  ?ipratropium-albuterol 0.5-2.5 (3) MG/3ML Soln ?Commonly known as: DUONEB ?  ?methadone 10 MG tablet ?Commonly known as: DOLOPHINE ?  ?methocarbamol 500 MG tablet ?Commonly known as: ROBAXIN ?  ?metoprolol tartrate 25 MG tablet ?Commonly known as: LOPRESSOR ?  ?multivitamin with minerals Tabs tablet ?  ?ondansetron 4 MG tablet ?Commonly known as: Zofran ?  ?OVER THE COUNTER MEDICATION ?  ?pantoprazole 40 MG tablet ?Commonly known as: PROTONIX ?  ?polyethylene glycol 17 g packet ?Commonly known as: MIRALAX / GLYCOLAX ?  ?predniSONE 5 MG tablet ?Commonly known as: DELTASONE ?  ?  vitamin C 500 MG tablet ?Commonly known as: ASCORBIC ACID ?  ? ?  ? ?TAKE these medications   ? ?atropine 1 % ophthalmic solution ?Place 1-2 drops under the tongue every 2 (two) hours as needed (secretions). ?  ?fentaNYL 25 MCG/HR ?Commonly known as: [place] ?Place 1 patch onto the skin every 3 (three) days. ?Start taking on: July 07, 2021 ?  ?glycopyrrolate 0.2 MG/ML injection ?Commonly known as: ROBINUL ?Inject 1.5 mLs (0.3 mg total) into the skin every 4 (four) hours as needed (secretions). ?  ?HYDROmorphone HCl 1 MG/ML Liqd ?Commonly known as: DILAUDID ?Take 1-2 mLs (1-2 mg total) by mouth every 2 (two) hours as needed for severe pain. ?Replaces: HYDROmorphone 4 MG tablet ?  ?lidocaine 5 % ?Commonly known as: LIDODERM ?Place 1 patch onto the skin daily.  Remove & Discard patch within 12 hours or as directed by MD ?  ?LORazepam 0.5 MG tablet ?Commonly known as: ATIVAN ?Take 1 tablet (0.5 mg total) by mouth 2 (two) times daily as needed for anxiety. ?What changed: Another me

## 2021-07-04 NOTE — Progress Notes (Signed)
Patient discharged with [facility] with all belongings taken with family.  ?

## 2021-07-04 NOTE — Progress Notes (Signed)
Chaplain spent several moments sitting with Randall Larsen as he was resting.  Chaplain was told that he would be moved to inpatient hospice later today. ? ?[employer], Bcc ?Pager, [phone] ?

## 2021-07-04 NOTE — Progress Notes (Signed)
? ? ?  Mr. Humm is minimally responsive. No acute distress noted. Is now requiring 2L/[place] for support. No oral nutrition or medications due to lethargy over the past 48 hours. His family have returned from out of town. Not currently at bedside but I was able to speak with them by phone.  ? ?They have toured [facility] and are in agreement with patient discharging there to continue ongoing support for what time he has left. Continued discussions regarding expectations at end-of-life with respect to his Muslim culture during the time of Ramadan.  ? ?Continue with symptom management. He currently does not have IV access however I have made medications available sublingually and subcutaneous.  ? ?Family express appreciation of support and care he has received during hospitalizations and over the months. Emotional support provided.  ? ?All questions answered.  ? ?Assessment ?-lethargic, comfort measures, chronically-ill ?-tachycardic ?-diminished bilaterally, 2L/[place] ?-bilateral lower extremity edema ?-large unstageable sacral ulcer (not assessed) ? ? ?Plan ?-Continue with comfort focused care ?-Spiritual support during this sensitive time of Ramadan ?-Comfort feeds if awake and alert to tolerate ?-Discharge to [facility] once approved, all paperwork signed, and report called to staff.  ?-DNR/DNI on chart for transport  ?-PMT available if needed. Please secure chat or page with urgent needs.  ? ?Time Total: 25 min  ? ?Visit consisted of counseling and education dealing with the complex and emotionally intense issues of symptom management and palliative care in the setting of serious and potentially life-threatening illness.Greater than 50%  of this time was spent counseling and coordinating care related to the above assessment and plan ? ?Phone: [phone] ?Pager: [phone] ?Amion: N. Cousar  ?Alda Lea, AGPCNP-BC  ?[place] ? ? ? ? ? ? ?

## 2021-07-04 NOTE — TOC Transition Note (Addendum)
Transition of Care (TOC) - CM/SW Discharge Note ? ? ?Patient Details  ?Name: Randall Larsen. ?MRN: [id number] ?Date of Birth: 1956/02/25 ? ?Transition of Care (TOC) CM/SW Contact:  ?Ross Ludwig, LCSW ?Phone Number: ?07/04/2021, 1:29 PM ? ? ?Clinical Narrative:    ? ?CSW was informed that patient will be going to [facility] once paperwork is completed. ?Patient to be d/c'ed today to [facility]. Patient and family agreeable to plans will transport via ems RN to call report [phone].   ? ? ? ? ?Final next level of care: [place] ?Barriers to Discharge: Barriers Resolved ? ? ?Patient Goals and CMS Choice ?Patient states their goals for this hospitalization and ongoing recovery are:: To go to [facility] facility. ?CMS Medicare.gov Compare Post Acute Care list provided to:: Patient Represenative (must comment) ?Choice offered to / list presented to : Adult Children ? ?Discharge Placement ?  ?           ?  ?  ?  ?  ? ?Discharge Plan and Services ?  ?  ?           ?  ?  ?  ?  ?  ?  ?  ?  ?  ?  ? ?Social Determinants of Health (SDOH) Interventions ?  ? ? ?Readmission Risk Interventions ?   ? View : No data to display.  ?  ?  ?  ? ? ? ? ? ?

## 2021-07-08 DEATH — deceased

## 2021-07-14 ENCOUNTER — Ambulatory Visit: Payer: Medicare HMO | Admitting: Physician Assistant

## 2021-07-23 ENCOUNTER — Encounter: Payer: Self-pay | Admitting: Physician Assistant

## 2022-04-13 ENCOUNTER — Other Ambulatory Visit (HOSPITAL_COMMUNITY): Payer: Self-pay

## 2024-01-20 ENCOUNTER — Encounter: Payer: Self-pay | Admitting: Oncology
